# Patient Record
Sex: Female | Born: 1935 | Race: White | Hispanic: No | Smoking: Former smoker
Health system: Southern US, Community
[De-identification: ages and names within clinical notes are randomized; demographics above are authoritative.]

## PROBLEM LIST (undated history)

## (undated) DIAGNOSIS — I5030 Unspecified diastolic (congestive) heart failure: Secondary | ICD-10-CM

## (undated) DIAGNOSIS — C189 Malignant neoplasm of colon, unspecified: Secondary | ICD-10-CM

## (undated) DIAGNOSIS — M316 Other giant cell arteritis: Secondary | ICD-10-CM

## (undated) DIAGNOSIS — G629 Polyneuropathy, unspecified: Secondary | ICD-10-CM

## (undated) DIAGNOSIS — E039 Hypothyroidism, unspecified: Secondary | ICD-10-CM

## (undated) DIAGNOSIS — IMO0002 Reserved for concepts with insufficient information to code with codable children: Secondary | ICD-10-CM

## (undated) DIAGNOSIS — Z9889 Other specified postprocedural states: Secondary | ICD-10-CM

## (undated) DIAGNOSIS — I10 Essential (primary) hypertension: Secondary | ICD-10-CM

## (undated) DIAGNOSIS — D649 Anemia, unspecified: Secondary | ICD-10-CM

## (undated) DIAGNOSIS — E785 Hyperlipidemia, unspecified: Secondary | ICD-10-CM

## (undated) DIAGNOSIS — I43 Cardiomyopathy in diseases classified elsewhere: Secondary | ICD-10-CM

## (undated) DIAGNOSIS — I509 Heart failure, unspecified: Secondary | ICD-10-CM

## (undated) DIAGNOSIS — F0390 Unspecified dementia without behavioral disturbance: Secondary | ICD-10-CM

## (undated) DIAGNOSIS — I119 Hypertensive heart disease without heart failure: Secondary | ICD-10-CM

## (undated) DIAGNOSIS — R0602 Shortness of breath: Secondary | ICD-10-CM

## (undated) DIAGNOSIS — R700 Elevated erythrocyte sedimentation rate: Secondary | ICD-10-CM

## (undated) DIAGNOSIS — Z8719 Personal history of other diseases of the digestive system: Secondary | ICD-10-CM

## (undated) DIAGNOSIS — Z9981 Dependence on supplemental oxygen: Secondary | ICD-10-CM

## (undated) DIAGNOSIS — N390 Urinary tract infection, site not specified: Secondary | ICD-10-CM

## (undated) DIAGNOSIS — G459 Transient cerebral ischemic attack, unspecified: Secondary | ICD-10-CM

## (undated) HISTORY — PX: OTHER SURGICAL HISTORY: SHX169

## (undated) HISTORY — PX: EYE SURGERY: SHX253

---
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
## 1972-09-26 HISTORY — PX: ABDOMINAL HYSTERECTOMY: SHX81

## 1985-09-26 HISTORY — PX: COLECTOMY: SHX59

## 1988-09-26 HISTORY — PX: HERNIA REPAIR: SHX51

## 1998-07-09 ENCOUNTER — Ambulatory Visit (HOSPITAL_COMMUNITY): Admission: RE | Admit: 1998-07-09 | Discharge: 1998-07-09 | Payer: Self-pay | Admitting: Ophthalmology

## 1998-07-15 ENCOUNTER — Ambulatory Visit (HOSPITAL_COMMUNITY): Admission: RE | Admit: 1998-07-15 | Discharge: 1998-07-15 | Payer: Self-pay | Admitting: Ophthalmology

## 1998-07-20 ENCOUNTER — Emergency Department (HOSPITAL_COMMUNITY): Admission: EM | Admit: 1998-07-20 | Discharge: 1998-07-20 | Payer: Self-pay | Admitting: Emergency Medicine

## 1998-07-21 ENCOUNTER — Encounter: Payer: Self-pay | Admitting: Family Medicine

## 1998-07-21 ENCOUNTER — Ambulatory Visit (HOSPITAL_COMMUNITY): Admission: RE | Admit: 1998-07-21 | Discharge: 1998-07-21 | Payer: Self-pay | Admitting: Family Medicine

## 1998-09-26 HISTORY — PX: CHOLECYSTECTOMY: SHX55

## 1998-11-11 ENCOUNTER — Inpatient Hospital Stay (HOSPITAL_COMMUNITY): Admission: AD | Admit: 1998-11-11 | Discharge: 1998-11-16 | Payer: Self-pay | Admitting: *Deleted

## 1998-12-02 ENCOUNTER — Encounter: Payer: Self-pay | Admitting: Gastroenterology

## 1998-12-02 ENCOUNTER — Inpatient Hospital Stay (HOSPITAL_COMMUNITY): Admission: AD | Admit: 1998-12-02 | Discharge: 1998-12-06 | Payer: Self-pay | Admitting: Gastroenterology

## 1998-12-03 ENCOUNTER — Encounter: Payer: Self-pay | Admitting: Gastroenterology

## 1998-12-09 ENCOUNTER — Inpatient Hospital Stay (HOSPITAL_COMMUNITY): Admission: EM | Admit: 1998-12-09 | Discharge: 1998-12-14 | Payer: Self-pay | Admitting: *Deleted

## 1998-12-18 ENCOUNTER — Ambulatory Visit (HOSPITAL_BASED_OUTPATIENT_CLINIC_OR_DEPARTMENT_OTHER): Admission: RE | Admit: 1998-12-18 | Discharge: 1998-12-18 | Payer: Self-pay | Admitting: General Surgery

## 2000-02-15 ENCOUNTER — Encounter: Payer: Self-pay | Admitting: Internal Medicine

## 2000-02-15 ENCOUNTER — Encounter: Admission: RE | Admit: 2000-02-15 | Discharge: 2000-02-15 | Payer: Self-pay | Admitting: Internal Medicine

## 2000-07-26 ENCOUNTER — Encounter (INDEPENDENT_AMBULATORY_CARE_PROVIDER_SITE_OTHER): Payer: Self-pay | Admitting: Specialist

## 2000-07-26 ENCOUNTER — Ambulatory Visit (HOSPITAL_COMMUNITY): Admission: RE | Admit: 2000-07-26 | Discharge: 2000-07-26 | Payer: Self-pay | Admitting: Gastroenterology

## 2001-02-15 ENCOUNTER — Encounter: Payer: Self-pay | Admitting: Internal Medicine

## 2001-02-15 ENCOUNTER — Encounter: Admission: RE | Admit: 2001-02-15 | Discharge: 2001-02-15 | Payer: Self-pay | Admitting: Internal Medicine

## 2001-02-20 ENCOUNTER — Encounter: Admission: RE | Admit: 2001-02-20 | Discharge: 2001-02-20 | Payer: Self-pay | Admitting: Internal Medicine

## 2001-02-20 ENCOUNTER — Encounter: Payer: Self-pay | Admitting: Internal Medicine

## 2001-04-25 ENCOUNTER — Encounter: Admission: RE | Admit: 2001-04-25 | Discharge: 2001-04-25 | Payer: Self-pay | Admitting: Internal Medicine

## 2001-04-25 ENCOUNTER — Encounter: Payer: Self-pay | Admitting: Internal Medicine

## 2001-05-21 ENCOUNTER — Encounter: Payer: Self-pay | Admitting: Neurology

## 2001-05-21 ENCOUNTER — Ambulatory Visit (HOSPITAL_COMMUNITY): Admission: RE | Admit: 2001-05-21 | Discharge: 2001-05-21 | Payer: Self-pay | Admitting: Neurology

## 2001-08-28 ENCOUNTER — Ambulatory Visit (HOSPITAL_COMMUNITY): Admission: RE | Admit: 2001-08-28 | Discharge: 2001-08-28 | Payer: Self-pay | Admitting: Neurology

## 2001-08-28 ENCOUNTER — Encounter: Payer: Self-pay | Admitting: Neurology

## 2001-10-15 ENCOUNTER — Encounter: Payer: Self-pay | Admitting: Rheumatology

## 2001-10-15 ENCOUNTER — Encounter: Admission: RE | Admit: 2001-10-15 | Discharge: 2001-10-15 | Payer: Self-pay | Admitting: Rheumatology

## 2001-11-19 ENCOUNTER — Encounter: Admission: RE | Admit: 2001-11-19 | Discharge: 2002-01-02 | Payer: Self-pay | Admitting: Neurology

## 2002-02-21 ENCOUNTER — Encounter: Payer: Self-pay | Admitting: Internal Medicine

## 2002-02-21 ENCOUNTER — Encounter: Admission: RE | Admit: 2002-02-21 | Discharge: 2002-02-21 | Payer: Self-pay | Admitting: Internal Medicine

## 2002-07-12 ENCOUNTER — Encounter: Payer: Self-pay | Admitting: Orthopedic Surgery

## 2002-07-12 ENCOUNTER — Ambulatory Visit (HOSPITAL_COMMUNITY): Admission: RE | Admit: 2002-07-12 | Discharge: 2002-07-12 | Payer: Self-pay | Admitting: Orthopedic Surgery

## 2002-09-26 HISTORY — PX: ROTATOR CUFF REPAIR: SHX139

## 2002-09-30 ENCOUNTER — Encounter: Payer: Self-pay | Admitting: Orthopedic Surgery

## 2002-09-30 ENCOUNTER — Encounter: Admission: RE | Admit: 2002-09-30 | Discharge: 2002-09-30 | Payer: Self-pay | Admitting: Orthopedic Surgery

## 2002-10-01 ENCOUNTER — Ambulatory Visit (HOSPITAL_BASED_OUTPATIENT_CLINIC_OR_DEPARTMENT_OTHER): Admission: RE | Admit: 2002-10-01 | Discharge: 2002-10-02 | Payer: Self-pay | Admitting: Orthopedic Surgery

## 2002-10-01 ENCOUNTER — Encounter (INDEPENDENT_AMBULATORY_CARE_PROVIDER_SITE_OTHER): Payer: Self-pay | Admitting: *Deleted

## 2003-03-25 ENCOUNTER — Encounter: Admission: RE | Admit: 2003-03-25 | Discharge: 2003-03-25 | Payer: Self-pay | Admitting: Internal Medicine

## 2003-03-25 ENCOUNTER — Encounter: Payer: Self-pay | Admitting: Internal Medicine

## 2003-07-02 ENCOUNTER — Ambulatory Visit (HOSPITAL_COMMUNITY): Admission: RE | Admit: 2003-07-02 | Discharge: 2003-07-02 | Payer: Self-pay | Admitting: Gastroenterology

## 2003-07-15 ENCOUNTER — Encounter: Admission: RE | Admit: 2003-07-15 | Discharge: 2003-07-15 | Payer: Self-pay | Admitting: Internal Medicine

## 2003-07-15 ENCOUNTER — Encounter: Payer: Self-pay | Admitting: Internal Medicine

## 2004-02-24 ENCOUNTER — Encounter: Admission: RE | Admit: 2004-02-24 | Discharge: 2004-02-24 | Payer: Self-pay | Admitting: Internal Medicine

## 2004-03-02 ENCOUNTER — Encounter: Admission: RE | Admit: 2004-03-02 | Discharge: 2004-03-02 | Payer: Self-pay | Admitting: Internal Medicine

## 2004-05-27 ENCOUNTER — Encounter: Admission: RE | Admit: 2004-05-27 | Discharge: 2004-05-27 | Payer: Self-pay | Admitting: Internal Medicine

## 2004-08-16 ENCOUNTER — Encounter: Admission: RE | Admit: 2004-08-16 | Discharge: 2004-08-16 | Payer: Self-pay | Admitting: Internal Medicine

## 2004-10-07 ENCOUNTER — Ambulatory Visit (HOSPITAL_COMMUNITY): Admission: RE | Admit: 2004-10-07 | Discharge: 2004-10-08 | Payer: Self-pay | Admitting: Neurology

## 2004-10-07 HISTORY — PX: LUMBAR PUNCTURE: SHX1985

## 2005-01-02 ENCOUNTER — Encounter: Admission: RE | Admit: 2005-01-02 | Discharge: 2005-01-02 | Payer: Self-pay | Admitting: Internal Medicine

## 2005-04-14 ENCOUNTER — Encounter: Admission: RE | Admit: 2005-04-14 | Discharge: 2005-05-03 | Payer: Self-pay | Admitting: Internal Medicine

## 2005-05-04 ENCOUNTER — Encounter: Admission: RE | Admit: 2005-05-04 | Discharge: 2005-05-31 | Payer: Self-pay | Admitting: Internal Medicine

## 2005-06-09 ENCOUNTER — Encounter: Admission: RE | Admit: 2005-06-09 | Discharge: 2005-06-09 | Payer: Self-pay | Admitting: Internal Medicine

## 2006-01-06 ENCOUNTER — Encounter: Admission: RE | Admit: 2006-01-06 | Discharge: 2006-01-06 | Payer: Self-pay | Admitting: Neurology

## 2006-01-25 ENCOUNTER — Ambulatory Visit (HOSPITAL_COMMUNITY): Admission: RE | Admit: 2006-01-25 | Discharge: 2006-01-25 | Payer: Self-pay | Admitting: Neurology

## 2006-04-11 ENCOUNTER — Encounter: Admission: RE | Admit: 2006-04-11 | Discharge: 2006-04-11 | Payer: Self-pay | Admitting: Internal Medicine

## 2006-06-20 ENCOUNTER — Encounter: Admission: RE | Admit: 2006-06-20 | Discharge: 2006-06-20 | Payer: Self-pay | Admitting: Internal Medicine

## 2007-06-27 ENCOUNTER — Encounter: Admission: RE | Admit: 2007-06-27 | Discharge: 2007-06-27 | Payer: Self-pay | Admitting: *Deleted

## 2008-06-30 ENCOUNTER — Encounter: Admission: RE | Admit: 2008-06-30 | Discharge: 2008-06-30 | Payer: Self-pay | Admitting: *Deleted

## 2008-10-23 ENCOUNTER — Encounter: Admission: RE | Admit: 2008-10-23 | Discharge: 2008-10-23 | Payer: Self-pay | Admitting: Family Medicine

## 2009-08-25 ENCOUNTER — Encounter: Admission: RE | Admit: 2009-08-25 | Discharge: 2009-08-25 | Payer: Self-pay | Admitting: Family Medicine

## 2009-09-26 HISTORY — PX: OTHER SURGICAL HISTORY: SHX169

## 2009-11-06 ENCOUNTER — Encounter: Admission: RE | Admit: 2009-11-06 | Discharge: 2009-11-06 | Payer: Self-pay | Admitting: Neurology

## 2010-06-08 ENCOUNTER — Ambulatory Visit (HOSPITAL_COMMUNITY): Admission: RE | Admit: 2010-06-08 | Discharge: 2010-06-08 | Payer: Self-pay | Admitting: Neurology

## 2010-06-08 ENCOUNTER — Encounter (INDEPENDENT_AMBULATORY_CARE_PROVIDER_SITE_OTHER): Payer: Self-pay | Admitting: Neurology

## 2010-06-08 ENCOUNTER — Ambulatory Visit: Payer: Self-pay | Admitting: Vascular Surgery

## 2010-06-10 ENCOUNTER — Emergency Department (HOSPITAL_COMMUNITY): Admission: EM | Admit: 2010-06-10 | Discharge: 2010-06-11 | Payer: Self-pay | Admitting: Emergency Medicine

## 2010-06-10 ENCOUNTER — Ambulatory Visit: Payer: Self-pay | Admitting: Vascular Surgery

## 2010-06-18 ENCOUNTER — Ambulatory Visit: Payer: Self-pay | Admitting: Cardiology

## 2010-06-18 ENCOUNTER — Inpatient Hospital Stay (HOSPITAL_COMMUNITY): Admission: RE | Admit: 2010-06-18 | Discharge: 2010-06-24 | Payer: Self-pay | Admitting: Vascular Surgery

## 2010-06-18 ENCOUNTER — Encounter (INDEPENDENT_AMBULATORY_CARE_PROVIDER_SITE_OTHER): Payer: Self-pay | Admitting: Family Medicine

## 2010-06-21 ENCOUNTER — Ambulatory Visit: Payer: Self-pay | Admitting: Physical Medicine & Rehabilitation

## 2010-06-24 ENCOUNTER — Inpatient Hospital Stay (HOSPITAL_COMMUNITY)
Admission: RE | Admit: 2010-06-24 | Discharge: 2010-07-06 | Payer: Self-pay | Admitting: Physical Medicine & Rehabilitation

## 2010-06-24 ENCOUNTER — Ambulatory Visit: Payer: Self-pay | Admitting: Physical Medicine & Rehabilitation

## 2010-07-15 ENCOUNTER — Ambulatory Visit: Payer: Self-pay | Admitting: Vascular Surgery

## 2010-07-30 ENCOUNTER — Ambulatory Visit: Payer: Self-pay | Admitting: Vascular Surgery

## 2010-07-30 ENCOUNTER — Ambulatory Visit (HOSPITAL_COMMUNITY): Admission: RE | Admit: 2010-07-30 | Discharge: 2010-07-30 | Payer: Self-pay | Admitting: Vascular Surgery

## 2010-10-11 ENCOUNTER — Encounter (HOSPITAL_BASED_OUTPATIENT_CLINIC_OR_DEPARTMENT_OTHER)
Admission: RE | Admit: 2010-10-11 | Discharge: 2010-10-26 | Payer: Self-pay | Source: Home / Self Care | Attending: Internal Medicine | Admitting: Internal Medicine

## 2010-10-23 ENCOUNTER — Observation Stay (HOSPITAL_COMMUNITY)
Admission: EM | Admit: 2010-10-23 | Discharge: 2010-10-28 | Disposition: A | Discharge status: Skilled Nursing Facility | Payer: Medicare Other | Attending: Internal Medicine | Admitting: Internal Medicine

## 2010-10-23 DIAGNOSIS — F0280 Dementia in other diseases classified elsewhere without behavioral disturbance: Secondary | ICD-10-CM | POA: Insufficient documentation

## 2010-10-23 DIAGNOSIS — E785 Hyperlipidemia, unspecified: Secondary | ICD-10-CM | POA: Insufficient documentation

## 2010-10-23 DIAGNOSIS — I498 Other specified cardiac arrhythmias: Secondary | ICD-10-CM | POA: Insufficient documentation

## 2010-10-23 DIAGNOSIS — D509 Iron deficiency anemia, unspecified: Secondary | ICD-10-CM | POA: Insufficient documentation

## 2010-10-23 DIAGNOSIS — G20A1 Parkinson's disease without dyskinesia, without mention of fluctuations: Secondary | ICD-10-CM | POA: Insufficient documentation

## 2010-10-23 DIAGNOSIS — E039 Hypothyroidism, unspecified: Secondary | ICD-10-CM | POA: Insufficient documentation

## 2010-10-23 DIAGNOSIS — Z8673 Personal history of transient ischemic attack (TIA), and cerebral infarction without residual deficits: Secondary | ICD-10-CM | POA: Insufficient documentation

## 2010-10-23 DIAGNOSIS — G20 Parkinson's disease: Secondary | ICD-10-CM | POA: Insufficient documentation

## 2010-10-23 DIAGNOSIS — R32 Unspecified urinary incontinence: Secondary | ICD-10-CM | POA: Insufficient documentation

## 2010-10-23 DIAGNOSIS — M069 Rheumatoid arthritis, unspecified: Secondary | ICD-10-CM | POA: Insufficient documentation

## 2010-10-23 DIAGNOSIS — L97509 Non-pressure chronic ulcer of other part of unspecified foot with unspecified severity: Secondary | ICD-10-CM | POA: Insufficient documentation

## 2010-10-23 DIAGNOSIS — R5381 Other malaise: Secondary | ICD-10-CM | POA: Insufficient documentation

## 2010-10-23 DIAGNOSIS — IMO0002 Reserved for concepts with insufficient information to code with codable children: Secondary | ICD-10-CM | POA: Insufficient documentation

## 2010-10-23 DIAGNOSIS — I10 Essential (primary) hypertension: Secondary | ICD-10-CM | POA: Insufficient documentation

## 2010-10-23 DIAGNOSIS — R079 Chest pain, unspecified: Secondary | ICD-10-CM | POA: Insufficient documentation

## 2010-10-23 DIAGNOSIS — R55 Syncope and collapse: Principal | ICD-10-CM | POA: Insufficient documentation

## 2010-10-23 LAB — DIFFERENTIAL
Basophils Absolute: 0 10*3/uL (ref 0.0–0.1)
Basophils Relative: 0 % (ref 0–1)
Eosinophils Absolute: 0.1 10*3/uL (ref 0.0–0.7)
Eosinophils Relative: 2 % (ref 0–5)
Lymphs Abs: 1.8 10*3/uL (ref 0.7–4.0)
Monocytes Absolute: 0.6 10*3/uL (ref 0.1–1.0)
Monocytes Relative: 7 % (ref 3–12)

## 2010-10-23 LAB — CBC
HCT: 33.5 % — ABNORMAL LOW (ref 36.0–46.0)
MCHC: 31.3 g/dL (ref 30.0–36.0)
MCV: 88.6 fL (ref 78.0–100.0)

## 2010-10-23 LAB — URINALYSIS, ROUTINE W REFLEX MICROSCOPIC
Nitrite: NEGATIVE
pH: 6.5 (ref 5.0–8.0)

## 2010-10-23 LAB — COMPREHENSIVE METABOLIC PANEL
ALT: <8 U/L (ref 0–35)
AST: 28 U/L (ref 0–37)
Calcium: 10.8 mg/dL — ABNORMAL HIGH (ref 8.4–10.5)
GFR calc Af Amer: 54 mL/min — ABNORMAL LOW (ref >60)
Glucose, Bld: 102 mg/dL — ABNORMAL HIGH (ref 70–99)

## 2010-10-23 LAB — CK TOTAL AND CKMB (NOT AT ARMC)
CK, MB: 2.2 ng/mL (ref 0.3–4.0)
Relative Index: INVALID (ref 0.0–2.5)
Total CK: 28 U/L (ref 7–177)

## 2010-10-23 LAB — TSH: TSH: 1.21 u[IU]/mL (ref 0.350–4.500)

## 2010-10-23 LAB — POCT CARDIAC MARKERS
CKMB, poc: 1.2 ng/mL (ref 1.0–8.0)
Myoglobin, poc: 122 ng/mL (ref 12–200)

## 2010-10-23 LAB — SEDIMENTATION RATE: Sed Rate: 91 mm/hr — ABNORMAL HIGH (ref 0–22)

## 2010-10-23 NOTE — H&P (Signed)
NAMESHARONLEE, NINE NO.:  [id number]  MEDICAL RECORD NO.:  [id number]          PATIENT TYPE:  EMS  LOCATION:  MAJO                         FACILITY:  [facility]  PHYSICIAN:  Homero Fellers, MD   DATE OF BIRTH:  October 04, 1929  DATE OF ADMISSION:  10/23/2010 DATE OF DISCHARGE:                             HISTORY & PHYSICAL   PRIMARY CARE PHYSICIAN:  [facility].  CHIEF COMPLAINT:  Syncope.  HISTORY OF PRESENT ILLNESS:  A 77 year old woman who lives with her daughter.  She was brought to the emergency room after she had witnessed syncopal episodes.  According to her daughter who is the main informant, the patient was on the commode and then she suddenly lost consciousness with eyes partly rolling backward in the head and it took about 2 minutes before she gained consciousness.  The patient did not have any witnessed seizure activity.  There is also no fecal incontinence.  The patient has history of baseline urinary incontinence and mostly wears a diaper.  The patient was recently treated empirically for urinary tract infection by a primary care physician with nitrofurantoin.  She was empirically treated  because of elevated WBC in blood.  Because of her urinary incontinence, urinalysis was not done due to reluctance in catheterization to obtain urine sample.  In the emergency room, repeat urinalysis however did not show any evidence of urinary tract infection.  She completed nitrofurantoin course yesterday.  The patient denies any fever, chest pain, shortness of breath, nausea, vomiting, or diaphoresis.  She has had no prior history of syncope or any previous work up for same.  She is currently on Aggrenox for TIA and also on Sinemet for possible Parkinson's disease.  At this time, the patient is currently back to her baseline.  Also of note, the family has noticed that in the past 1 week, the patient has been weak and unable to walk as she was able to  previously. She is baseline, is able to walk with a walker.  PAST MEDICAL HISTORY:  High blood pressure, hypothyroidism, Parkinson's disease, peripheral neuropathy, transient ischemic attack.  History of previous or recurrent urinary tract infection.  Questionable peripheral vascular disease with chronic ulcer in the left foot.  PAST SURGICAL HISTORY:  Cholecystectomy, hernia repair, hysterectomy, and tonsillectomy.  MEDICATIONS: 1. Carbidopa/levodopa 25/100, 1 tablet b.i.d. 2. Nitrofurantoin has been completed. 3. Levothyroxine 150 mcg daily. 4. Tylenol p.r.n. 5. MiraLax p.r.n. 6. Calcium 600 mg daily. 7. Aggrenox 25 mg/200, 1 tab b.i.d. 8. Cymbalta 30 mg daily. 9. Hydrochlorothiazide 25 mg daily. 10.Prednisone 10 mg daily. 11.Metoprolol 50 mg b.i.d. (this was just started about 3 months ago). 12.Vitamin D3 1 tablet daily. 13.Aricept 10 mg daily. 14.Zocor 20 mg daily. 15.Omeprazole 20 mg daily. 16.Centrum silver.  ALLERGIES:  CODEINE, MORPHINE, DEMEROL, and CIPROFLOXACIN.  SOCIAL HISTORY:  No smoking, alcohol, or drugs.  The patient lives with daughter.  REVIEW OF SYSTEMS:  A 10-point review of systems is negative except as described above.  FAMILY HISTORY:  Noncontributory.  PHYSICAL EXAMINATION:  VITAL SIGNS:  Blood pressure is 136/55, pulse 48- 52, respirations 18, temp 97.7, O2 sat 100%.  GENERAL:  She is lying in bed comfortable in no distress, awake, alert and oriented. HEENT:  Pallor.  Mouth is slightly dry. NECK:  Supple.  No JVD, adenopathy, or thyromegaly. LUNGS:  Clear to auscultation bilaterally.  No wheezing, no crackles. HEART:  S1, S2.  There is bradycardia.  No murmurs, rubs, or gallops. ABDOMEN:  Full, soft, nontender, bowel sounds present.  No masses. EXTREMITIES:  No edema, clubbing, or cyanosis.  She has dressing over the left foot.  There is also evidence of poor circulation to the left foot. The color appeared to be normal. NEUROLOGIC:.  The  patient is alert and oriented x3.  Cranial nerves II- XII is intact.  Speech is normal.  Coordination is preserved in the upper extremity.  Gait was not tested.  LABORATORY DATA:  Sodium is 132, potassium 3.7, BUN 26, creatinine 1.18, glucose is 102.  Liver enzymes are grossly normal.  White count is also normal at 8.4, hemoglobin 10.5, platelet count is 324,000.  Urinalysis today was unremarkable.  Cardiac enzymes x1 were negative.  EKG showed some bradycardia at about 48 beats per minute with right bundle-branch block. I reviewed her old EKG in September of last year which showed normal sinus rhythm of 84 beats per minute with right bundle-branch block.  IMAGING:  Chest x-ray showed cardiomegaly with no active lung disease.  ASSESSMENT: 1. This is a 77 year old woman admitted with an episode of syncope,     gastroesophageal reflux disease.  Etiology of this is okay, but     bradycardia into the 30s or 40s and possibilities of syncopal     episodes.  She will need full workup for syncope as this is a new     presentation for her. 2. Sinus bradycardia which might be secondary to metoprolol which was     started about 3 months ago, especially in view of the fact that EKG     3 months ago was normal sinus rhythm. 3. Mild clinical dehydration. 4. High blood pressure which is currently stable. 5. History of transient ischemic attack, on Aggrenox.  The patient is     also complaining of weakness in her legs and inability to bear     weight for the past 1 week.  Workup for CVA would be reasonable. 6. Recent urinary tract infection which has been fully treated. 7. Chronic left leg ulcer with possible peripheral vascular disease. PLAN: Admit to telemetry, do syncopal workup including     a 2-D echo, carotid Doppler, and MRI/MRA of the brain.  The patient     had an echo about 4 months ago; We may need to obtain the result.  The patient will be on telemetry floor to get serial cardiac enzyme  and EKG.  I will hold beta blocker and     hydrochlorothiazide,and  give her some IV fluids.  We can use hydralazine if needed     for blood pressure control.  All other home medicines to be continued.  She     will be on DVT prophylaxis.  Her condition overall is stable, 55     minutes spent.   ADDENDUM:  While in the ED, patient suddenly became confused and not answering questions. There was no new focal weakness, drooling or loss of nasolabial fold on exam. A stst MRI Brain did not show any evidence of acute stroke. Patient will be closely watched on  telemetry. She has Deemetia and this may be one manifestation of her  disease.   Homero Fellers, MD     FA/MEDQ  D:  10/23/2010  T:  10/23/2010  Job:  [id number]  Electronically Signed by Homero Fellers  on 10/23/2010 05:05:21 PM

## 2010-10-24 LAB — RETICULOCYTES
RBC.: 3.77 MIL/uL — ABNORMAL LOW (ref 3.87–5.11)
Retic Count, Absolute: 33.9 10*3/uL (ref 19.0–186.0)
Retic Ct Pct: 0.9 % (ref 0.4–3.1)

## 2010-10-24 LAB — BASIC METABOLIC PANEL
CO2: 25 mEq/L (ref 19–32)
Chloride: 103 mEq/L (ref 96–112)
GFR calc Af Amer: 56 mL/min — ABNORMAL LOW (ref >60)
Sodium: 138 mEq/L (ref 135–145)

## 2010-10-24 LAB — CARDIAC PANEL(CRET KIN+CKTOT+MB+TROPI)
CK, MB: 4.8 ng/mL — ABNORMAL HIGH (ref 0.3–4.0)
Total CK: 186 U/L — ABNORMAL HIGH (ref 7–177)
Troponin I: 0.05 ng/mL (ref 0.00–0.06)

## 2010-10-24 LAB — CBC
Hemoglobin: 9.9 g/dL — ABNORMAL LOW (ref 12.0–15.0)
Platelets: 286 10*3/uL (ref 150–400)
RBC: 3.61 MIL/uL — ABNORMAL LOW (ref 3.87–5.11)
WBC: 7.7 10*3/uL (ref 4.0–10.5)

## 2010-10-24 LAB — MAGNESIUM: Magnesium: 1.3 mg/dL — ABNORMAL LOW (ref 1.5–2.5)

## 2010-10-24 LAB — FERRITIN: Ferritin: 36 ng/mL (ref 10–291)

## 2010-10-24 LAB — IRON AND TIBC
Saturation Ratios: 8 % — ABNORMAL LOW (ref 20–55)
TIBC: 331 ug/dL (ref 250–470)
UIBC: 305 ug/dL

## 2010-10-24 LAB — URINE CULTURE: Culture: NO GROWTH

## 2010-10-24 LAB — LIPID PANEL
Cholesterol: 175 mg/dL (ref 0–200)
Total CHOL/HDL Ratio: 2.1 RATIO

## 2010-10-26 LAB — MAGNESIUM: Magnesium: 1.7 mg/dL (ref 1.5–2.5)

## 2010-10-26 LAB — CBC
HCT: 28.7 % — ABNORMAL LOW (ref 36.0–46.0)
Hemoglobin: 9.1 g/dL — ABNORMAL LOW (ref 12.0–15.0)
MCH: 28.1 pg (ref 26.0–34.0)
MCHC: 31.7 g/dL (ref 30.0–36.0)
MCV: 88.6 fL (ref 78.0–100.0)
RDW: 14.8 % (ref 11.5–15.5)

## 2010-10-26 LAB — BASIC METABOLIC PANEL
BUN: 18 mg/dL (ref 6–23)
Chloride: 103 mEq/L (ref 96–112)
Creatinine, Ser: 1.16 mg/dL (ref 0.4–1.2)
Glucose, Bld: 91 mg/dL (ref 70–99)
Potassium: 3.8 mEq/L (ref 3.5–5.1)

## 2010-10-28 LAB — CBC
HCT: 32.2 % — ABNORMAL LOW (ref 36.0–46.0)
Hemoglobin: 9.7 g/dL — ABNORMAL LOW (ref 12.0–15.0)
MCHC: 30.1 g/dL (ref 30.0–36.0)
MCV: 89.2 fL (ref 78.0–100.0)

## 2010-10-28 LAB — DIFFERENTIAL
Basophils Absolute: 0 10*3/uL (ref 0.0–0.1)
Lymphocytes Relative: 31 % (ref 12–46)
Lymphs Abs: 2.1 10*3/uL (ref 0.7–4.0)
Monocytes Absolute: 0.5 10*3/uL (ref 0.1–1.0)
Neutro Abs: 4 10*3/uL (ref 1.7–7.7)

## 2010-10-28 LAB — BASIC METABOLIC PANEL
BUN: 15 mg/dL (ref 6–23)
CO2: 23 mEq/L (ref 19–32)
Glucose, Bld: 86 mg/dL (ref 70–99)
Potassium: 3.7 mEq/L (ref 3.5–5.1)
Sodium: 134 mEq/L — ABNORMAL LOW (ref 135–145)

## 2010-10-28 NOTE — Discharge Summary (Signed)
NAMEYARELIE, HAMS NO.:  [id number]  MEDICAL RECORD NO.:  [id number]          PATIENT TYPE:  OBV  LOCATION:  4733                         FACILITY:  [facility]  PHYSICIAN:  Hartley Barefoot, MD    DATE OF BIRTH:  [birth year]  DATE OF ADMISSION:  10/23/2010 DATE OF DISCHARGE:  10/28/10                        DISCHARGE SUMMARY - REFERRING   ANTICIPATED DISCHARGE DATE:  October 28, 2010.  DISCHARGE DIAGNOSES: 1. Syncope, possible secondary to bradycardia. 2. Deconditioning. 3. Iron-deficiency anemia. 4. Left foot chronic wound followed by wound care, Dr. Cheryll Cockayne. 5. Sinus bradycardia secondary to beta-blocker.  OTHER PAST MEDICAL HISTORY.: 1. Hypertension. 2. History of CVA. 3. History of urinary tract infection. 4. Parkinson disease. 5. History of giant cell arteritis, on chronic prednisone. 6. Hypothyroidism. 7. Hyperlipidemia.  DISCHARGE MEDICATIONS: 1. Ferrous sulfate 325 p.o. t.i.d. 2. Mupirocin, Bactroban one application topically daily. 3. Aggrenox one capsule by mouth twice daily. 4. Aricept 10 mg p.o. daily at bedtime. 5. Tylenol 325 one to two tablets by mouth every 4 hours as needed. 6. Calcium carbonate 600 two tablets by mouth twice daily. 7. Cymbalta 30 mg one capsule by mouth every morning. 8. Carbidopa and levodopa 25/100 mg 1 tablet by mouth twice daily. 9. Hydrochlorothiazide 25 mg p.o. every morning. 10.Levothyroxine 150 mcg p.o. every morning. 11.MiraLax 1 pack by mouth daily as needed. 12.Multivitamins one tablet by mouth every morning. 13.Omeprazole 20 mg 1 tablet at bedtime. 14.Prednisone 10 mg p.o. every morning. 15.Simvastatin 20 mg p.o. at bedtime.  Medications during hospitalization: 1. Metoprolol 50 mg p.o. b.i.d. 2. Nitrofurantoin 2 tablets twice daily.  DISPOSITION AND FOLLOWUP:  The patient was transferred to [facility] for short period.  She will need physical therapy and she will need wound care consult.   She will need to follow up with her primary care physician and Dr. Cheryll Cockayne, wound care physician, within 1 week.  She will need a CBC to follow hemoglobin levels.  The MRI of brain on October 23, 2010, showed no acute intracranial abnormality, atrophy and extensive white matter disease.  This is most likely reflected  chronic microvascular ischemia.  Small right mastoid effusion without obstructive nasopharyngeal lesion. Chest x-ray showed mild cardiomegaly,no active  disease,  mild peribronchial thickening.  Carotid Doppler bilateral, no ICA stenosis.  2-D echo showed left ventricular pressures with 65 to 70%.  No wall motion abnormality.  BRIEF HISTORY OF PRESENT ILLNESS:  This is a very pleasant 77 year old with multiple medical problems who presented here after syncope event. The patient was found by daughter lying on the floor.  According to the daughter, the patient was sitting in the commode when she loss conciousness and it took about 2 minutes before she gained consciousness.  No witnessed seizure activity.  No fecal incontinence.  HOSPITAL COURSE: 1. Syncope.  The patient was admitted to Telemetry.  She was found to     be bradycardic, heart rate of 40s.  Syncope was possibly     secondary to bradycardia.  She was taking beta-blockers.  Her heart     rate has increased and it has remained 88-90s.  I will  continue to     hold beta-blockers.  MRI was negative for acute stroke. No Arrhythmia on telemetry.     Cardiac enzymes, troponin x3 negative.  The patient is now back to     baseline. 2. Deconditioning.  The patient's physical therapy recommended at     Skilled Nursing Facility.  The patient will be transferred to     Skilled Nursing Facility when bed is available.  She will need to     go physical therapy on daily basis. 3. Anemia.  The patient with iron-deficiency anemia.  Panel drawn on     October 24, 2010, show iron at 26, percent saturation 8, B12 of     979, folate  more than 20, ferritin 36.  The patient was started on     ferrous sulfate 325 p.o. t.i.d.  If the patient has not had a     colonoscopy, she might need one.  They will need to follow through     with her primary care physician. 4. History of temporal arteritis.  The patient was recently started on     prednisone 10 mg p.o. daily due to headaches and increased ESR.     She will need to follow up this with her primary care physician.     Continue with prednisone 10 mg p.o. daily. 5. Left lower extremity chronic wound.  She will follow with Dr.     Cheryll Cockayne.  The plan is for conservative treatment.  On wound care     basis, now he recommends application of Iodosorb gel.  Cover with     dry dressing and she will have Lambs wool placed between the toes of     the left foot.  she will use either a heel protector boot to protect      her extremity for pressure on the heel.   Those are recommendation per Dr. Cheryll Cockayne.  A copy of this     wound care clinic nurse will be faxed to Skilled Nursing Facility     also.  We recommend after shower that she air dry, pat and dry, then reapply her     ointment and dressing as above. 6. Hypertension.  We will restart hydrochlorothiazide.  Discontinue     beta-blocker. 7. On the day of discharge, the patient was in good condition.     Vitals, temperature 97.3, pulse 88, respirations 18,     blood pressure 140/77, sats 98 on room air.  Occult blood negative.     CBC on October 26, 2010, show hemoglobin 9.1, white blood cell 7.9,     platelet 286.  Sodium 138, potassium 3.9, chloride 103, bicarbonate     27, glucose 91, BUN 18, and creatinine 1.16.     Hartley Barefoot, MD     BR/MEDQ  D:  10/27/2010  T:  10/27/2010  Job:  [id number]  cc:   Nancee Liter, MD skilled nursing facility, no name given  Electronically Signed by Hartley Barefoot MD on 10/28/2010 10:28:48 AM

## 2010-10-29 ENCOUNTER — Encounter (HOSPITAL_BASED_OUTPATIENT_CLINIC_OR_DEPARTMENT_OTHER): Payer: Medicare Other | Attending: Internal Medicine

## 2010-10-29 DIAGNOSIS — I10 Essential (primary) hypertension: Secondary | ICD-10-CM | POA: Insufficient documentation

## 2010-10-29 DIAGNOSIS — E079 Disorder of thyroid, unspecified: Secondary | ICD-10-CM | POA: Insufficient documentation

## 2010-10-29 DIAGNOSIS — L98499 Non-pressure chronic ulcer of skin of other sites with unspecified severity: Secondary | ICD-10-CM | POA: Insufficient documentation

## 2010-10-29 DIAGNOSIS — E785 Hyperlipidemia, unspecified: Secondary | ICD-10-CM | POA: Insufficient documentation

## 2010-10-29 DIAGNOSIS — I739 Peripheral vascular disease, unspecified: Secondary | ICD-10-CM | POA: Insufficient documentation

## 2010-10-29 DIAGNOSIS — K219 Gastro-esophageal reflux disease without esophagitis: Secondary | ICD-10-CM | POA: Insufficient documentation

## 2010-11-01 NOTE — H&P (Signed)
  NAMEEVELETTE, [name] NO.:  [id number]  MEDICAL RECORD NO.:  [id number]          PATIENT TYPE:  OBV  LOCATION:  4733                         FACILITY:  [facility]  PHYSICIAN:  Homero Fellers, MD   DATE OF BIRTH:  1930-01-10  DATE OF ADMISSION:  10/23/2010 DATE OF DISCHARGE:                             HISTORY & PHYSICAL   ADDENDUM  Please note that the patient was diagnosed with temporal arteritis some time last year and has been on prednisone which has been tapered down to 10 mg daily, which she is currently on.  She was off prednisone at some point, but this was started recently because of headaches and elevated ESR.  The patient also being followed [facility] for leg foot ulcer as described in the body of the history above as well as peripheral vascular disease which is severe and unrepairable going by Dr. Norman Clay notes on October 11, 2010.  The patient continued wound care while in the hospital.  She might need a formal consult.     Homero Fellers, MD     FA/MEDQ  D:  10/23/2010  T:  10/24/2010  Job:  [id number]  Electronically Signed by Homero Fellers  on 11/01/2010 10:27:01 PM

## 2010-11-29 ENCOUNTER — Encounter (HOSPITAL_BASED_OUTPATIENT_CLINIC_OR_DEPARTMENT_OTHER): Payer: Medicare Other | Attending: Internal Medicine

## 2010-11-29 DIAGNOSIS — L97509 Non-pressure chronic ulcer of other part of unspecified foot with unspecified severity: Secondary | ICD-10-CM | POA: Insufficient documentation

## 2010-12-03 ENCOUNTER — Emergency Department (HOSPITAL_COMMUNITY): Payer: Medicare Other

## 2010-12-03 ENCOUNTER — Inpatient Hospital Stay (HOSPITAL_COMMUNITY)
Admission: EM | Admit: 2010-12-03 | Discharge: 2010-12-06 | DRG: 690 | Disposition: A | Discharge status: Home Health | Payer: Medicare Other | Attending: Internal Medicine | Admitting: Internal Medicine

## 2010-12-03 LAB — POCT I-STAT, CHEM 8
Calcium, Ion: 1.08 mmol/L — ABNORMAL LOW (ref 1.12–1.32)
Glucose, Bld: 96 mg/dL (ref 70–99)
HCT: 37 % (ref 36.0–46.0)
Hemoglobin: 12.6 g/dL (ref 12.0–15.0)
TCO2: 26 mmol/L (ref 0–100)

## 2010-12-03 LAB — CBC
HCT: 35.6 % — ABNORMAL LOW (ref 36.0–46.0)
MCH: 28 pg (ref 26.0–34.0)
MCHC: 31.2 g/dL (ref 30.0–36.0)
MCV: 89.7 fL (ref 78.0–100.0)
RDW: 18 % — ABNORMAL HIGH (ref 11.5–15.5)

## 2010-12-03 LAB — URINALYSIS, ROUTINE W REFLEX MICROSCOPIC
Bilirubin Urine: NEGATIVE
Ketones, ur: NEGATIVE mg/dL
Nitrite: NEGATIVE
Urobilinogen, UA: 0.2 mg/dL (ref 0.0–1.0)
pH: 8.5 — ABNORMAL HIGH (ref 5.0–8.0)

## 2010-12-03 LAB — BRAIN NATRIURETIC PEPTIDE: Pro B Natriuretic peptide (BNP): 119 pg/mL — ABNORMAL HIGH (ref 0.0–100.0)

## 2010-12-03 LAB — COMPREHENSIVE METABOLIC PANEL
Alkaline Phosphatase: 55 U/L (ref 39–117)
BUN: 14 mg/dL (ref 6–23)
GFR calc non Af Amer: 38 mL/min — ABNORMAL LOW (ref >60)
Glucose, Bld: 98 mg/dL (ref 70–99)
Potassium: 3.7 mEq/L (ref 3.5–5.1)
Total Bilirubin: 0.6 mg/dL (ref 0.3–1.2)
Total Protein: 6.8 g/dL (ref 6.0–8.3)

## 2010-12-03 LAB — POCT CARDIAC MARKERS
Myoglobin, poc: 292 ng/mL (ref 12–200)
Troponin i, poc: <0.05 ng/mL (ref 0.00–0.09)

## 2010-12-03 LAB — DIFFERENTIAL
Eosinophils Relative: 0 % (ref 0–5)
Lymphocytes Relative: 19 % (ref 12–46)
Lymphs Abs: 1.9 10*3/uL (ref 0.7–4.0)
Monocytes Absolute: 0.6 10*3/uL (ref 0.1–1.0)

## 2010-12-04 DIAGNOSIS — Z8673 Personal history of transient ischemic attack (TIA), and cerebral infarction without residual deficits: Secondary | ICD-10-CM

## 2010-12-04 DIAGNOSIS — R5381 Other malaise: Secondary | ICD-10-CM | POA: Diagnosis present

## 2010-12-04 DIAGNOSIS — N390 Urinary tract infection, site not specified: Principal | ICD-10-CM | POA: Diagnosis present

## 2010-12-04 DIAGNOSIS — I739 Peripheral vascular disease, unspecified: Secondary | ICD-10-CM | POA: Diagnosis present

## 2010-12-04 DIAGNOSIS — F0390 Unspecified dementia without behavioral disturbance: Secondary | ICD-10-CM | POA: Diagnosis present

## 2010-12-04 DIAGNOSIS — E039 Hypothyroidism, unspecified: Secondary | ICD-10-CM | POA: Diagnosis present

## 2010-12-04 DIAGNOSIS — E860 Dehydration: Secondary | ICD-10-CM | POA: Diagnosis present

## 2010-12-04 DIAGNOSIS — E785 Hyperlipidemia, unspecified: Secondary | ICD-10-CM | POA: Diagnosis present

## 2010-12-04 DIAGNOSIS — I10 Essential (primary) hypertension: Secondary | ICD-10-CM | POA: Diagnosis present

## 2010-12-04 DIAGNOSIS — G20A1 Parkinson's disease without dyskinesia, without mention of fluctuations: Secondary | ICD-10-CM | POA: Diagnosis present

## 2010-12-04 DIAGNOSIS — G20 Parkinson's disease: Secondary | ICD-10-CM | POA: Diagnosis present

## 2010-12-04 DIAGNOSIS — L98499 Non-pressure chronic ulcer of skin of other sites with unspecified severity: Secondary | ICD-10-CM | POA: Diagnosis present

## 2010-12-04 DIAGNOSIS — R5383 Other fatigue: Secondary | ICD-10-CM | POA: Diagnosis present

## 2010-12-04 DIAGNOSIS — L97909 Non-pressure chronic ulcer of unspecified part of unspecified lower leg with unspecified severity: Secondary | ICD-10-CM | POA: Diagnosis present

## 2010-12-04 LAB — CBC
MCH: 29 pg (ref 26.0–34.0)
MCHC: 32.1 g/dL (ref 30.0–36.0)
Platelets: 268 10*3/uL (ref 150–400)

## 2010-12-04 LAB — COMPREHENSIVE METABOLIC PANEL
AST: 24 U/L (ref 0–37)
BUN: 12 mg/dL (ref 6–23)
CO2: 31 mEq/L (ref 19–32)
Calcium: 9 mg/dL (ref 8.4–10.5)
Chloride: 102 mEq/L (ref 96–112)
Creatinine, Ser: 1.27 mg/dL — ABNORMAL HIGH (ref 0.4–1.2)
GFR calc non Af Amer: 41 mL/min — ABNORMAL LOW (ref >60)
Glucose, Bld: 96 mg/dL (ref 70–99)
Total Bilirubin: 0.6 mg/dL (ref 0.3–1.2)

## 2010-12-04 LAB — PROLACTIN: Prolactin: 15.4 ng/mL

## 2010-12-04 LAB — CARDIAC PANEL(CRET KIN+CKTOT+MB+TROPI)
Relative Index: INVALID (ref 0.0–2.5)
Relative Index: INVALID (ref 0.0–2.5)
Total CK: 92 U/L (ref 7–177)
Troponin I: 0.08 ng/mL — ABNORMAL HIGH (ref 0.00–0.06)
Troponin I: 0.06 ng/mL (ref 0.00–0.06)

## 2010-12-05 LAB — BASIC METABOLIC PANEL
BUN: 8 mg/dL (ref 6–23)
Calcium: 8.6 mg/dL (ref 8.4–10.5)
Chloride: 104 mEq/L (ref 96–112)
Creatinine, Ser: 0.94 mg/dL (ref 0.4–1.2)

## 2010-12-05 LAB — MAGNESIUM: Magnesium: 1.7 mg/dL (ref 1.5–2.5)

## 2010-12-05 LAB — CBC
MCH: 29.4 pg (ref 26.0–34.0)
MCHC: 32.3 g/dL (ref 30.0–36.0)
MCV: 91.1 fL (ref 78.0–100.0)
Platelets: 261 10*3/uL (ref 150–400)
RBC: 3.81 MIL/uL — ABNORMAL LOW (ref 3.87–5.11)

## 2010-12-05 LAB — T4, FREE: Free T4: 1.45 ng/dL (ref 0.80–1.80)

## 2010-12-05 LAB — TSH: TSH: 0.207 u[IU]/mL — ABNORMAL LOW (ref 0.350–4.500)

## 2010-12-06 LAB — BASIC METABOLIC PANEL
CO2: 29 mEq/L (ref 19–32)
Chloride: 106 mEq/L (ref 96–112)
GFR calc Af Amer: >60 mL/min (ref >60)
Glucose, Bld: 105 mg/dL — ABNORMAL HIGH (ref 70–99)
Sodium: 142 mEq/L (ref 135–145)

## 2010-12-06 LAB — CBC
HCT: 32.5 % — ABNORMAL LOW (ref 36.0–46.0)
Hemoglobin: 10.4 g/dL — ABNORMAL LOW (ref 12.0–15.0)
MCH: 29.5 pg (ref 26.0–34.0)
MCHC: 32 g/dL (ref 30.0–36.0)
RBC: 3.53 MIL/uL — ABNORMAL LOW (ref 3.87–5.11)

## 2010-12-07 LAB — URINE CULTURE

## 2010-12-07 LAB — POCT I-STAT, CHEM 8
Calcium, Ion: 1.16 mmol/L (ref 1.12–1.32)
Chloride: 103 mEq/L (ref 96–112)
Creatinine, Ser: 1.6 mg/dL — ABNORMAL HIGH (ref 0.4–1.2)
Glucose, Bld: 89 mg/dL (ref 70–99)
Potassium: 3.7 mEq/L (ref 3.5–5.1)

## 2010-12-08 LAB — SEDIMENTATION RATE: Sed Rate: 26 mm/hr — ABNORMAL HIGH (ref 0–22)

## 2010-12-08 NOTE — Discharge Summary (Signed)
[name], [name] NO.:  [id number]  MEDICAL RECORD NO.:  [id number]           PATIENT TYPE:  LOCATION:                                 FACILITY:  PHYSICIAN:  Karen Blower, MD       DATE OF BIRTH:  [birth year]  DATE OF ADMISSION: DATE OF DISCHARGE:                              DISCHARGE SUMMARY   PRIMARY CARE PHYSICIAN:  Dr. Drue Harrison at [place] [facility].  DISCHARGE DIAGNOSES: 1. Urinary tract infection. 2. Generalized weakness, multifactorial. 3. Hypertension. 4. Hypothyroidism. 5. History of peripheral vascular disease with left lower extremity     ulcer, stable. 6. History of CVA with TIA, stable. 7. History of Parkinson disease. 8. History of dementia. 9. History of giant cell arteritis, currently on prednisone for     elevated ESR. 10.Hyperlipidemia. 11.History of iron-deficiency anemia. 12.Chronic left foot wound from peripheral vascular disease followed     by Dr. Cheryll Harrison with Wound Care. 13.History of sinus bradycardia secondary to beta-blocker. 14.Deconditioning. 15.History of syncope.  DISCHARGE MEDICATION: 1. Amlodipine 2.5 mg p.o. daily. 2. Ceftriaxone 1 g IV daily at bedtime. 3. Aggrenox 1 capsule p.o. twice daily. 4. Donepezil 10 mg p.o. daily at bedtime. 5. Calcium carbonate 600 mg/vitamin D2 tablets twice daily. 6. Carbidopa/levodopa 25/100 mg p.o. twice daily. 7. Ferrous sulfate 325 mg 3 times a day with meals. 8. Levothyroxine 150 mcg p.o. q.a.m. 9. MiraLax 1 packet daily as needed for constipation. 10.Multivitamin 1 tablet p.o. q.a.m. 11.Omeprazole 20 mg daily at bedtime. 12.Prednisone 10 mg p.o. q.a.m. 13.Simvastatin 20 mg daily at bedtime. 14.Tylenol 325 mg p.o. daily. 15.Vitamin D3 2000 units p.o. q.p.m.  BRIEF ADMITTING HISTORY AND PHYSICAL:  [name] is a 77 year old Caucasian female with multiple medical conditions with recent history of recurrent UTI, who presented with nausea, vomiting, generalized weakness,  and partially treated UTI.  RADIOLOGY/IMAGING:  The patient had a chest x-ray on December 03, 2010, which showed increased central venous pulmonary congestion, chronic bronchitis markings.  LABORATORY DATA:  CBC shows white count of 6.3, hemoglobin 10.4, hematocrit 32.5, platelet count 231.  Electrolytes normal with a creatinine of 1.07.  Liver function tests normal except albumin of 3.1. Initial lactic acid on presentation was 2.9, lipase was 28.  Troponin initially was 0.08, then 0.06.  TSH is 0.207, free T4 is 1.45.  UA was negative for nitrates and leukocytes.  Blood cultures x2 shows no growth to date.  Urine culture grew Proteus mirabilis with sensitivities pending.  HOSPITAL COURSE BY PROBLEM: 1. Partially treated urinary tract infection.  Initially, the patient     was on Bactrim at home; however, the patient has been having nausea     and vomiting, suspect that the patient may have intolerance to     Bactrim.  She also has a history of allergies listed to     ciprofloxacin and nitrofurantoin.  As a result, decision was made    to put a PICC line in and to treat her urinary tract infection with     IV ceftriaxone.  The patient initially on hospitalization was     started on  ceftriaxone.  After discharge, she will be on     ceftriaxone for 4 more days to complete a 7-day course of     ceftriaxone.  I spoke with [name] with Infectious Diseases     who reported that [facility] has 90% sensitivity to Proteus mirabilis as     a result, even though the sensitivities for Proteus mirabilis are     still pending.  I will discharge her home on ceftriaxone.  Will     need to follow up with her primary care physician for further     management. 2. Generalized weakness, multifactorial, most likely due to     deconditioning and urinary tract infection.  The patient was     evaluated by Physical Therapy who recommended home health physical     therapy. 3. Hypertension.  Discontinued  hydrochlorothiazide and started her on     amlodipine 2.5 mg p.o. day.  Further titration of antihypertensive     medications will be done as an outpatient. 4. Mild dehydration, resolved with IV hydration. 5. Hypothyroidism.  The patient's TSH was low, but free T4 was normal.     I am uncertain when the dose of Synthroid was adjusted by her     primary care physician.  We will defer to her primary care     physician and make new further adjustments to her Synthroid as an     outpatient. 6. History of peripheral vascular disease with left lower extremity     ulcer, stable.  Continue management of wound care as an outpatient.     Ulcer is less than half centimeter at her heel. 7. History of CVA with TIA, was not an active issue at this time. 8. History of Parkinson disease, stable. 9. History of dementia, stable. 10.History of giant cell arteritis, currently is on prednisone for     elevated ESR, does not report jaw claudication or any other     symptoms to suggest an ongoing disease.  We will defer to Dr. Sandria Harrison     her neurologist to help manage on the titration of the prednisone. 11.Hyperlipidemia, continue statin. 12.History of iron-deficiency anemia, continue supplemental iron.     Hemoglobin is stable during the posthospital stay, drop in     hemoglobin suspected due to delusional reasons.  DISPOSITION AND FOLLOWUP:  The patient is to follow up with Dr. Drue Harrison, her primary care physician in 1 week.  The patient is to follow up with her urologist in 1 week.  Time spent on discharge talking to the patient and coordinating care was 40 minutes.     Karen Blower, MD     SR/MEDQ  D:  12/06/2010  T:  12/07/2010  Job:  [id number]  Electronically Signed by [name]  on 12/08/2010 12:09:04 AM

## 2010-12-09 LAB — CBC
HCT: 34.4 % — ABNORMAL LOW (ref 36.0–46.0)
HCT: 35.5 % — ABNORMAL LOW (ref 36.0–46.0)
HCT: 37.2 % (ref 36.0–46.0)
Hemoglobin: 10.6 g/dL — ABNORMAL LOW (ref 12.0–15.0)
Hemoglobin: 11.6 g/dL — ABNORMAL LOW (ref 12.0–15.0)
Hemoglobin: 11.6 g/dL — ABNORMAL LOW (ref 12.0–15.0)
MCH: 29.5 pg (ref 26.0–34.0)
MCH: 29.8 pg (ref 26.0–34.0)
MCHC: 31.5 g/dL (ref 30.0–36.0)
MCHC: 31.8 g/dL (ref 30.0–36.0)
MCHC: 32 g/dL (ref 30.0–36.0)
MCHC: 32.4 g/dL (ref 30.0–36.0)
MCV: 89.8 fL (ref 78.0–100.0)
MCV: 93.7 fL (ref 78.0–100.0)
Platelets: 322 10*3/uL (ref 150–400)
Platelets: 351 10*3/uL (ref 150–400)
Platelets: 366 10*3/uL (ref 150–400)
Platelets: 366 10*3/uL (ref 150–400)
Platelets: 371 10*3/uL (ref 150–400)
Platelets: 386 10*3/uL (ref 150–400)
RBC: 3.93 MIL/uL (ref 3.87–5.11)
RBC: 4 MIL/uL (ref 3.87–5.11)
RDW: 14.3 % (ref 11.5–15.5)
RDW: 14.4 % (ref 11.5–15.5)
RDW: 14.4 % (ref 11.5–15.5)
RDW: 14.5 % (ref 11.5–15.5)
RDW: 14.6 % (ref 11.5–15.5)
WBC: 10 10*3/uL (ref 4.0–10.5)
WBC: 10.9 10*3/uL — ABNORMAL HIGH (ref 4.0–10.5)
WBC: 6.3 10*3/uL (ref 4.0–10.5)
WBC: 9.3 10*3/uL (ref 4.0–10.5)
WBC: 9.9 10*3/uL (ref 4.0–10.5)

## 2010-12-09 LAB — BASIC METABOLIC PANEL
BUN: 12 mg/dL (ref 6–23)
BUN: 9 mg/dL (ref 6–23)
CO2: 25 mEq/L (ref 19–32)
CO2: 27 mEq/L (ref 19–32)
CO2: 28 mEq/L (ref 19–32)
Calcium: 7.9 mg/dL — ABNORMAL LOW (ref 8.4–10.5)
Calcium: 8.1 mg/dL — ABNORMAL LOW (ref 8.4–10.5)
Calcium: 8.3 mg/dL — ABNORMAL LOW (ref 8.4–10.5)
Calcium: 8.7 mg/dL (ref 8.4–10.5)
Chloride: 100 mEq/L (ref 96–112)
Chloride: 103 mEq/L (ref 96–112)
Chloride: 105 mEq/L (ref 96–112)
Chloride: 107 mEq/L (ref 96–112)
Creatinine, Ser: 0.87 mg/dL (ref 0.4–1.2)
Creatinine, Ser: 0.87 mg/dL (ref 0.4–1.2)
Creatinine, Ser: 0.9 mg/dL (ref 0.4–1.2)
Creatinine, Ser: 1.05 mg/dL (ref 0.4–1.2)
GFR calc Af Amer: >60 mL/min (ref >60)
GFR calc Af Amer: >60 mL/min (ref >60)
GFR calc Af Amer: >60 mL/min (ref >60)
GFR calc Af Amer: >60 mL/min (ref >60)
GFR calc non Af Amer: 51 mL/min — ABNORMAL LOW (ref >60)
GFR calc non Af Amer: 54 mL/min — ABNORMAL LOW (ref >60)
GFR calc non Af Amer: >60 mL/min (ref >60)
GFR calc non Af Amer: >60 mL/min (ref >60)
Glucose, Bld: 81 mg/dL (ref 70–99)
Glucose, Bld: 98 mg/dL (ref 70–99)
Potassium: 3.5 mEq/L (ref 3.5–5.1)
Potassium: 4.5 mEq/L (ref 3.5–5.1)
Sodium: 137 mEq/L (ref 135–145)
Sodium: 139 mEq/L (ref 135–145)
Sodium: 142 mEq/L (ref 135–145)
Sodium: 142 mEq/L (ref 135–145)

## 2010-12-09 LAB — LIPID PANEL
Cholesterol: 162 mg/dL (ref 0–200)
HDL: 93 mg/dL (ref >39)
LDL Cholesterol: 57 mg/dL (ref 0–99)
Total CHOL/HDL Ratio: 1.7 RATIO
Triglycerides: 60 mg/dL (ref <150)

## 2010-12-09 LAB — POCT I-STAT, CHEM 8
BUN: 21 mg/dL (ref 6–23)
Chloride: 99 mEq/L (ref 96–112)
Creatinine, Ser: 1.3 mg/dL — ABNORMAL HIGH (ref 0.4–1.2)
Potassium: 4 mEq/L (ref 3.5–5.1)
Sodium: 138 mEq/L (ref 135–145)

## 2010-12-09 LAB — DIFFERENTIAL
Basophils Absolute: 0 10*3/uL (ref 0.0–0.1)
Eosinophils Absolute: 0 10*3/uL (ref 0.0–0.7)
Lymphocytes Relative: 23 % (ref 12–46)
Lymphs Abs: 2.7 10*3/uL (ref 0.7–4.0)
Monocytes Absolute: 0.5 10*3/uL (ref 0.1–1.0)
Monocytes Absolute: 0.5 10*3/uL (ref 0.1–1.0)
Monocytes Relative: 5 % (ref 3–12)
Neutro Abs: 8.6 10*3/uL — ABNORMAL HIGH (ref 1.7–7.7)
Neutrophils Relative %: 68 % (ref 43–77)

## 2010-12-09 LAB — CARDIAC PANEL(CRET KIN+CKTOT+MB+TROPI)
CK, MB: 2.3 ng/mL (ref 0.3–4.0)
Relative Index: INVALID (ref 0.0–2.5)
Total CK: 36 U/L (ref 7–177)
Troponin I: 0.03 ng/mL (ref 0.00–0.06)

## 2010-12-09 LAB — T4, FREE: Free T4: 1.74 ng/dL (ref 0.80–1.80)

## 2010-12-09 LAB — HEPATIC FUNCTION PANEL
ALT: 20 U/L (ref 0–35)
AST: 33 U/L (ref 0–37)
Albumin: 2.9 g/dL — ABNORMAL LOW (ref 3.5–5.2)
Alkaline Phosphatase: 76 U/L (ref 39–117)
Total Bilirubin: 0.7 mg/dL (ref 0.3–1.2)
Total Protein: 6.7 g/dL (ref 6.0–8.3)

## 2010-12-09 LAB — APTT: aPTT: 27 seconds (ref 24–37)

## 2010-12-09 LAB — COMPREHENSIVE METABOLIC PANEL
ALT: 18 U/L (ref 0–35)
AST: 23 U/L (ref 0–37)
Albumin: 3 g/dL — ABNORMAL LOW (ref 3.5–5.2)
Albumin: 3.2 g/dL — ABNORMAL LOW (ref 3.5–5.2)
Calcium: 10.3 mg/dL (ref 8.4–10.5)
Calcium: 9.2 mg/dL (ref 8.4–10.5)
Creatinine, Ser: 1.11 mg/dL (ref 0.4–1.2)
GFR calc Af Amer: 54 mL/min — ABNORMAL LOW (ref >60)
GFR calc Af Amer: 58 mL/min — ABNORMAL LOW (ref >60)
GFR calc non Af Amer: 48 mL/min — ABNORMAL LOW (ref >60)
Glucose, Bld: 109 mg/dL — ABNORMAL HIGH (ref 70–99)
Sodium: 139 mEq/L (ref 135–145)
Total Protein: 6.4 g/dL (ref 6.0–8.3)

## 2010-12-09 LAB — GLUCOSE, CAPILLARY
Glucose-Capillary: 104 mg/dL — ABNORMAL HIGH (ref 70–99)
Glucose-Capillary: 106 mg/dL — ABNORMAL HIGH (ref 70–99)
Glucose-Capillary: 114 mg/dL — ABNORMAL HIGH (ref 70–99)
Glucose-Capillary: 119 mg/dL — ABNORMAL HIGH (ref 70–99)
Glucose-Capillary: 119 mg/dL — ABNORMAL HIGH (ref 70–99)
Glucose-Capillary: 125 mg/dL — ABNORMAL HIGH (ref 70–99)
Glucose-Capillary: 126 mg/dL — ABNORMAL HIGH (ref 70–99)
Glucose-Capillary: 130 mg/dL — ABNORMAL HIGH (ref 70–99)
Glucose-Capillary: 155 mg/dL — ABNORMAL HIGH (ref 70–99)
Glucose-Capillary: 90 mg/dL (ref 70–99)
Glucose-Capillary: 94 mg/dL (ref 70–99)
Glucose-Capillary: 95 mg/dL (ref 70–99)

## 2010-12-09 LAB — CULTURE, BLOOD (ROUTINE X 2): Culture  Setup Time: 201109240151

## 2010-12-09 LAB — PROTIME-INR
INR: 0.89 (ref 0.00–1.49)
INR: 0.9 (ref 0.00–1.49)
INR: 0.91 (ref 0.00–1.49)
INR: 0.94 (ref 0.00–1.49)
INR: 0.99 (ref 0.00–1.49)
INR: 1.03 (ref 0.00–1.49)
Prothrombin Time: 12.4 seconds (ref 11.6–15.2)
Prothrombin Time: 12.8 seconds (ref 11.6–15.2)
Prothrombin Time: 13.3 seconds (ref 11.6–15.2)

## 2010-12-09 LAB — ANA: Anti Nuclear Antibody(ANA): NEGATIVE

## 2010-12-09 LAB — URINALYSIS, ROUTINE W REFLEX MICROSCOPIC
Ketones, ur: 15 mg/dL — AB
Nitrite: NEGATIVE
Protein, ur: NEGATIVE mg/dL

## 2010-12-09 LAB — HEMOGLOBIN A1C: Mean Plasma Glucose: 120 mg/dL — ABNORMAL HIGH (ref <117)

## 2010-12-09 LAB — MRSA PCR SCREENING: MRSA by PCR: NEGATIVE

## 2010-12-09 LAB — RHEUMATOID FACTOR: Rhuematoid fact SerPl-aCnc: <20 IU/mL (ref 0–20)

## 2010-12-10 LAB — CULTURE, BLOOD (ROUTINE X 2)
Culture  Setup Time: 201203100207
Culture: NO GROWTH
Culture: NO GROWTH

## 2011-01-04 ENCOUNTER — Encounter (HOSPITAL_BASED_OUTPATIENT_CLINIC_OR_DEPARTMENT_OTHER): Payer: Medicare Other | Attending: Internal Medicine

## 2011-01-04 DIAGNOSIS — L97509 Non-pressure chronic ulcer of other part of unspecified foot with unspecified severity: Secondary | ICD-10-CM | POA: Insufficient documentation

## 2011-01-11 NOTE — H&P (Signed)
[name], [name] NO.:  [id number]  MEDICAL RECORD NO.:  [id number]           PATIENT TYPE:  E  LOCATION:  [facility]                         FACILITY:  [facility]  PHYSICIAN:  Lonia Blood, M.D.      DATE OF BIRTH:  12-03-1929  DATE OF ADMISSION:  12/03/2010 DATE OF DISCHARGE:                             HISTORY & PHYSICAL   PRIMARY CARE PHYSICIAN:  [facility], Dr. Drue Novel.  PRESENTING COMPLAINT:  Nausea, vomiting, and generalized weakness.  HISTORY OF PRESENT ILLNESS:  The patient is a 76 year old female with multiple medical problems who has been dealing with some recurring UTI's.  She was last treated in February of this year and was admitted in the hospital.  She has apparently being going up and down.  She went to Dr. Luan Pulling office and was diagnosed with progressive weakness and probably recurring UTI.  She was given antibiotics 2 days ago, which she started taking.  The antibiotics included sulfur.  Since that she has been now having nausea, vomiting and she has become so weak.  The patient has had prior reaction to Cipro antibiotic that per family led to some levels of  somehow TIA's.  She took that antibiotic and seems like she is also not tolerating it as well.  She denied any fever or chills at this point, but she is generally very weak and unable to keep food or medications down.  She has had previous one again in February as indicated.  PAST MEDICAL HISTORY:  Recurring UTIs.  The patient apparently has seen a urologist in the past and was diagnosed with small contracted bladder. History of cerebrovascular accident with recurrent TIAs, dehydration, history of syncope, hypertension, hyperlipidemia, history of hypothyroidism, Parkinson disease, peripheral neuropathy, peripheral vascular disease with left lower extremity ulcer, which has been healing nicely mild dementia, history of giant-cell arthritis, history of colon cancer.  PAST SURGICAL  HISTORY:  Status post cholecystectomy, status post hernia repair, status post hysterectomy, status post tonsillectomy, status post rotator cuff repair in 2004, history of temporal artery biopsy in 2011, history of cataract removal of her right eye also in 2011.  Her colon resection was in 1987, where 18 inches of bowel was removed.  She is also status post appendectomy back in 1947.  ALLERGIES:  Codeine, morphine, Demerol, and Cipro.  CURRENT MEDICATIONS:  Aggrenox, Aggrenox, Aricept, Cymbalta, Detrol, hydrochlorothiazide, Lopressor, multivitamins, Os-Cal Ultra.  She is on prednisone, Protonix, Sinemet, Synthroid, Tylenol, vitamin D2, and Zocor.  Also, on ferrous sulfate.  Her full home medications will be obtained in due course.  SOCIAL HISTORY:  The patient currently lives with her daughter and son- in-law.  She has apparently been doing well with her ADLs.  No tobacco, alcohol or IV drug use.  SOCIAL HISTORY:  The patient used to smoke but quit many years ago.  Her current daughter that she lives with is [employer]. Also, she has a son who lives in [place] at the moment.  FAMILY HISTORY:  Significant for father died with an MI at an old age rather.  Her mother had dementia, one of the daughters  is healthy and the other son is also healthy.  REVIEW OF SYSTEMS:  All systems reviewed are currently negative except per HPI.  PHYSICAL EXAMINATION:  VITAL SIGNS:  Temperature 99.0 rectally, blood pressure is 192/90 with a pulse of 96, respiratory 20, and sats  100% on room air. GENERAL:  The patient looks weak but in no acute distress. HEENT: PERRL.  EOMI.  No pallor, no jaundice.  No rhinorrhea. NECK:  Supple.  No JVD.  No lymphadenopathy. RESPIRATORY:  She has good air entry bilaterally.  No wheezes.  No rales.  No crackles. CARDIOVASCULAR SYSTEM:  S1, S2.  No murmur. ABDOMEN:  Soft, full, nontender with positive bowel sounds. EXTREMITIES:  No edema, cyanosis or clubbing.  She  has a few ulcers on the left lower extremity including the heel, below the knees have left big toe but they all seem to be healing well. MUSCULOSKELETAL:  No exact joint swelling or tenderness. SKIN:  No other rashes or ulcers.  LABORATORY DATA:  Her BNP is 119.  Initial cardiac markers are negative. Lactic acid level 2.9.  Her lipase is 28.  Sodium is 134, potassium 3.7, chloride 96, CO2 of 27, glucose 98, BUN 14, creatinine 1.34, total bilirubin is 0.6, albumin 3.2, calcium 9.3 with the rest of the LFTs within normal.  Her urinalysis showed cloudy urine with a pH of 8.5, otherwise negative.  White count is 10.0, hemoglobin 11.1, and platelet 277.  Her chest x-ray showed increase in central venous pulmonary congestion and chronic bronchitic markings, otherwise no infiltrates.  ASSESSMENT:  This is a 77 year old female presented with profound weakness that on neurologic exam showed no focal findings.  She has no neuropathy and recent urinary tract infection that was partially treated.  I suspect the patient's symptoms are due to partially treated urinary tract infection leading to some dehydration.  She may have also reactive to sulfa which lead to nausea, vomiting and subsequently the dehydration and generalized weakness.  The patient may be allergic to sulfa as well as being allergic to Cipro that is known from previous workup.  PLAN: 1. Generalized weakness.  We will admit the patient for observation.     No reasons and also get a new urine culture.  I will put her on IV     Rocephin at this point.  Follow her response in the hospital     closely.  Control her nausea, vomiting and once is controlled, we     will transition her to some oral antibiotics.  We will avoid     quinolones as well as sulfa drugs. 2. Partially treated UTI.  Again, I will put her on Rocephin in the     hospital. 3. Hypertension.  As soon as her nausea, vomiting is controlled, we     will start her back on  her oral blood pressure medicines.  In the     meantime, if needed, we will use some IV Lopressor. 4. Hypothyroidism.  I will continue with levothyroxine and also     recheck her TSH. 5. Left lower extremity ulcer.  This seems like is healing nicely and     so will not initiate any new workup.  The rest of her other medical     problems seem to be stable at this point and we will continue with     her home care.     Lonia Blood, M.D.    Verlin Grills  D:  12/04/2010  T:  12/04/2010  Job:  [id number]  Electronically Signed by Lonia Blood M.D. on 01/11/2011 03:33:31 PM

## 2011-01-24 ENCOUNTER — Emergency Department (HOSPITAL_COMMUNITY): Payer: Medicare Other

## 2011-01-24 ENCOUNTER — Emergency Department (HOSPITAL_COMMUNITY)
Admission: EM | Admit: 2011-01-24 | Discharge: 2011-01-25 | Disposition: A | Discharge status: Home / Self Care | Payer: Medicare Other | Attending: Emergency Medicine | Admitting: Emergency Medicine

## 2011-01-24 DIAGNOSIS — R0682 Tachypnea, not elsewhere classified: Secondary | ICD-10-CM | POA: Insufficient documentation

## 2011-01-24 DIAGNOSIS — Z79899 Other long term (current) drug therapy: Secondary | ICD-10-CM | POA: Insufficient documentation

## 2011-01-24 DIAGNOSIS — R0989 Other specified symptoms and signs involving the circulatory and respiratory systems: Secondary | ICD-10-CM | POA: Insufficient documentation

## 2011-01-24 DIAGNOSIS — R0609 Other forms of dyspnea: Secondary | ICD-10-CM | POA: Insufficient documentation

## 2011-01-24 DIAGNOSIS — G20A1 Parkinson's disease without dyskinesia, without mention of fluctuations: Secondary | ICD-10-CM | POA: Insufficient documentation

## 2011-01-24 DIAGNOSIS — F068 Other specified mental disorders due to known physiological condition: Secondary | ICD-10-CM | POA: Insufficient documentation

## 2011-01-24 DIAGNOSIS — G20 Parkinson's disease: Secondary | ICD-10-CM | POA: Insufficient documentation

## 2011-01-24 DIAGNOSIS — E785 Hyperlipidemia, unspecified: Secondary | ICD-10-CM | POA: Insufficient documentation

## 2011-01-24 DIAGNOSIS — I10 Essential (primary) hypertension: Secondary | ICD-10-CM | POA: Insufficient documentation

## 2011-01-24 DIAGNOSIS — R1110 Vomiting, unspecified: Secondary | ICD-10-CM | POA: Insufficient documentation

## 2011-01-24 DIAGNOSIS — Z8673 Personal history of transient ischemic attack (TIA), and cerebral infarction without residual deficits: Secondary | ICD-10-CM | POA: Insufficient documentation

## 2011-01-24 DIAGNOSIS — R000 Tachycardia, unspecified: Secondary | ICD-10-CM | POA: Insufficient documentation

## 2011-01-24 DIAGNOSIS — E039 Hypothyroidism, unspecified: Secondary | ICD-10-CM | POA: Insufficient documentation

## 2011-01-24 DIAGNOSIS — R5381 Other malaise: Secondary | ICD-10-CM | POA: Insufficient documentation

## 2011-01-24 LAB — COMPREHENSIVE METABOLIC PANEL
AST: 32 U/L (ref 0–37)
Albumin: 3.4 g/dL — ABNORMAL LOW (ref 3.5–5.2)
BUN: 17 mg/dL (ref 6–23)
Calcium: 9.5 mg/dL (ref 8.4–10.5)
Creatinine, Ser: 1.2 mg/dL (ref 0.4–1.2)
GFR calc Af Amer: 53 mL/min — ABNORMAL LOW (ref >60)
Total Protein: 7.2 g/dL (ref 6.0–8.3)

## 2011-01-24 LAB — POCT I-STAT 3, VENOUS BLOOD GAS (G3P V)
Acid-Base Excess: 8 mmol/L — ABNORMAL HIGH (ref 0.0–2.0)
Bicarbonate: 31.5 mEq/L — ABNORMAL HIGH (ref 20.0–24.0)
O2 Saturation: 43 %
Patient temperature: 98.5
TCO2: 33 mmol/L (ref 0–100)
pCO2, Ven: 37.4 mmHg — ABNORMAL LOW (ref 45.0–50.0)
pH, Ven: 7.533 — ABNORMAL HIGH (ref 7.250–7.300)
pO2, Ven: 21 mmHg — CL (ref 30.0–45.0)

## 2011-01-24 LAB — CBC
HCT: 38.2 % (ref 36.0–46.0)
Hemoglobin: 12.8 g/dL (ref 12.0–15.0)
MCH: 31.3 pg (ref 26.0–34.0)
MCHC: 33.5 g/dL (ref 30.0–36.0)
MCV: 93.4 fL (ref 78.0–100.0)
Platelets: 291 K/uL (ref 150–400)
RBC: 4.09 MIL/uL (ref 3.87–5.11)
RDW: 15.7 % — ABNORMAL HIGH (ref 11.5–15.5)
WBC: 8.3 10*3/uL (ref 4.0–10.5)

## 2011-01-24 LAB — URINALYSIS, ROUTINE W REFLEX MICROSCOPIC
Bilirubin Urine: NEGATIVE
Glucose, UA: NEGATIVE mg/dL
Hgb urine dipstick: NEGATIVE
Ketones, ur: NEGATIVE mg/dL
Nitrite: NEGATIVE
Protein, ur: NEGATIVE mg/dL
Specific Gravity, Urine: 1.016 (ref 1.005–1.030)
Urobilinogen, UA: 0.2 mg/dL (ref 0.0–1.0)
pH: 7.5 (ref 5.0–8.0)

## 2011-01-24 LAB — COMPREHENSIVE METABOLIC PANEL WITH GFR
ALT: 16 U/L (ref 0–35)
Alkaline Phosphatase: 56 U/L (ref 39–117)
CO2: 29 meq/L (ref 19–32)
Chloride: 98 meq/L (ref 96–112)
GFR calc non Af Amer: 43 mL/min — ABNORMAL LOW (ref >60)
Glucose, Bld: 116 mg/dL — ABNORMAL HIGH (ref 70–99)
Potassium: 4.9 meq/L (ref 3.5–5.1)
Sodium: 136 meq/L (ref 135–145)
Total Bilirubin: 0.7 mg/dL (ref 0.3–1.2)

## 2011-01-24 LAB — DIFFERENTIAL
Basophils Absolute: 0 10*3/uL (ref 0.0–0.1)
Basophils Relative: 0 % (ref 0–1)
Eosinophils Absolute: 0 K/uL (ref 0.0–0.7)
Eosinophils Relative: 0 % (ref 0–5)
Lymphocytes Relative: 14 % (ref 12–46)
Lymphs Abs: 1.1 K/uL (ref 0.7–4.0)
Monocytes Absolute: 0.4 K/uL (ref 0.1–1.0)
Monocytes Relative: 5 % (ref 3–12)
Neutro Abs: 6.7 10*3/uL (ref 1.7–7.7)
Neutrophils Relative %: 81 % — ABNORMAL HIGH (ref 43–77)

## 2011-01-24 LAB — POCT CARDIAC MARKERS
CKMB, poc: 2.1 ng/mL (ref 1.0–8.0)
Myoglobin, poc: 150 ng/mL (ref 12–200)
Troponin i, poc: <0.05 ng/mL (ref 0.00–0.09)

## 2011-01-24 LAB — LIPASE, BLOOD: Lipase: 30 U/L (ref 11–59)

## 2011-01-27 ENCOUNTER — Encounter (HOSPITAL_BASED_OUTPATIENT_CLINIC_OR_DEPARTMENT_OTHER): Payer: Medicare Other | Attending: Internal Medicine

## 2011-01-27 DIAGNOSIS — L989 Disorder of the skin and subcutaneous tissue, unspecified: Secondary | ICD-10-CM | POA: Insufficient documentation

## 2011-01-27 DIAGNOSIS — I739 Peripheral vascular disease, unspecified: Secondary | ICD-10-CM | POA: Insufficient documentation

## 2011-01-27 DIAGNOSIS — G589 Mononeuropathy, unspecified: Secondary | ICD-10-CM | POA: Insufficient documentation

## 2011-02-08 NOTE — Assessment & Plan Note (Signed)
OFFICE VISIT   [name], SCHNEEBERGER A  DOB:  [birth year]                                       06/10/2010  ONGEX#:[id number]   CHIEF COMPLAINT:  Left foot pain.   HISTORY OF PRESENT ILLNESS:  The patient is a 77 year old female  referred by Dr. Sandria Manly for evaluation of a cool dusky left lower  extremity.  The patient has some memory deficits but the daughter states  that the left foot developed a purple color approximately 4 days ago.  She states that this may have been present actually though for several  weeks.  She states that it has improved somewhat today.  The patient  usually ambulates with a walker but is a high fall risk because her  knees tend to give way.  She has had several falls over the last few  days because the left foot has been sore.  She has been minimally  ambulatory over that time.  She lives at home with her daughter.  In  addition, she has just been diagnosed with a urinary tract infection and  was not feeling well during the office visit today and is actually  having some rigors during the office visit.   CHRONIC MEDICAL PROBLEMS:  Include elevated cholesterol, giant cell  arteritis, memory loss, peripheral neuropathy, prior stroke.  These are  all currently controlled and have been followed by Dr. Sandria Manly.   PAST SURGICAL HISTORY:  Tonsillectomy, appendectomy, cholecystectomy,  rotator cuff repair, hysterectomy, cataract removal and a colon  resection.   SOCIAL HISTORY:  She is retired.  She is widowed.  She does not smoke or  consume alcohol.   FAMILY HISTORY:  Remarkable for her brother who had vascular disease at  a young age.   REVIEW OF SYSTEMS:  Full 12 point review of systems was performed with  the patient today.  Please see intake referral form for details  regarding this.   MEDICATIONS:  Aggrenox, Levothroid, Detrol LA, hydrochlorothiazide,  Aricept, Prilosec, simvastatin, stool softener, multivitamin, Cymbalta,  prednisone, Tylenol, vitamin D3 and Caltrate.   She has allergies listed to morphine, Demerol and codeine.   PHYSICAL EXAM:  Vital signs:  Blood pressure is 174/89 in the left arm,  heart rate is 105 and regular.  Temperature is 98.2, respirations 22.  HEENT:  Unremarkable.  Neck:  Has 2+ carotid pulses without bruit.  Chest:  Clear to auscultation.  Cardiac:  Exam is regular rate and  rhythm although tachycardia with an occasional irregular beat.  Abdomen:  Is soft, nontender, nondistended.  No masses.  Extremities:  She has a  1+ right femoral pulse.  She has a 2+ left femoral pulse.  She has  absent popliteal and pedal pulses bilaterally.  She has 2+ edema in the  lower extremities from the knee down bilaterally.  Musculoskeletal:  Exam otherwise showed no major joint deformities.  Neurologic:  She has  bilateral 4/5 motor strength in the lower extremities.  Skin:  Has no  ulcers or rashes.  The left foot is dusky but has fairly brisk capillary  refill but does have the appearance of acute on chronic ischemia.  The  foot is nontender to palpation.   She had a 12-lead EKG performed today at [facility] which showed sinus  tachycardia, no evidence of atrial fibrillation.  She had bilateral ABIs  performed at [facility] on September 13 which showed an ABI on  the left side of 0.4 and on the right of 0.87.   On my exam today she did have dorsalis pedis and posterior tibial  Doppler flow to the left foot.   In summary, the patient has had an acute decrease in circulation to her  left lower extremity.  This is most likely a phenomena of worsening of a  chronic condition.  She does not have atrial fibrillation or other  embolic type etiology that I can find.  I believe the best option for  her would be an aortogram with lower extremity runoff with possible  percutaneous revascularization versus consideration for bypass or  thrombectomy of the left lower extremity.  I have arranged for  this for  06/18/2010.  Hopefully she will be over her urinary tract infection  which has made her fairly ill currently.  I do not believe she is a  candidate for the arteriogram while she is currently having rigors and  apparently had an elevated white blood cell count of 18,000 today.  She  will report to short stay on September 23 for arteriogram as long as her  symptoms of urinary tract infection have resolved.  If her symptoms in  her left foot become worse over time she will call me back and we will  reconsider that plan.     Janetta Hora. Fields, MD  Electronically Signed   CEF/MEDQ  D:  06/10/2010  T:  06/11/2010  Job:  [id number]   cc:   Dr Toniann Fail. Love, M.D.

## 2011-02-08 NOTE — Assessment & Plan Note (Signed)
OFFICE VISIT   OZELLA, COMINS A  DOB:  October 14, 1929                                       07/15/2010  JWJXB#:[id number]   The patient returns for followup today.  She was originally scheduled to  have an arteriogram 3 weeks ago but at the time of presentation to [facility] short stay she was very confused and lethargic.  She had a 3 week  hospital stay at [facility] and was treated for urosepsis, mild  dementia, her gait disorder as well as aphasia and Parkinson's disease.  She had several medication adjustments and is now much more back to  herself.   Currently she denies any rest pain in her left foot.  She has no  ulcerations on the foot.  She states that the swelling in her lower  extremities has improved somewhat.  She denies any shortness of breath  or chest pain on review of systems.  She is alert and oriented x3 on the  office visit today.  She is ambulatory.   PHYSICAL EXAM:  She has bilateral femoral pulses 1+ on the right, 2+ on  the left.  She has absent popliteal and pedal pulses bilaterally.  She  has no real significant edema in the lower extremities at this point.  The left foot is dusky in appearance with delayed capillary refill  compared to the right.  The right is approximately 2-3 seconds, the left  is 5-7 seconds.   At this point the patient seems to have recovered from her multiple  medical problems.  Her ABIs on the left side are decreased at 0.4.  I  believe the best option at this point is reschedule her for her  aortogram lower extremity runoff with a focus on the left leg.  This is  scheduled for 07/30/2010.  Risks, benefits, possible complications and  procedure details were reviewed with the patient and her daughter today.     Janetta Hora. Fields, MD  Electronically Signed   CEF/MEDQ  D:  07/15/2010  T:  07/16/2010  Job:  [id number]   cc:   Genene Churn. Love, M.D.  Dr Lynnae Prude

## 2011-02-11 NOTE — Op Note (Signed)
NAMEURSALA, [name] NO.:  [id number]   MEDICAL RECORD NO.:  [id number]          PATIENT TYPE:  OUT   LOCATION:  [facility]                          FACILITY:  [facility]   PHYSICIAN:  Genene Churn. Love, M.D.    DATE OF BIRTH:  [birth year]   DATE OF PROCEDURE:  10/07/2004  DATE OF DISCHARGE:                                 OPERATIVE REPORT   This 77 year old patient has been followed for a gait disorder and has had  numbness in her feet and legs thought to represent an axonal neuropathy.  She has also had memory loss and difficulty for two years with bladder  incontinence requiring her to use a pad.  She has not had any other new  neurologic symptomatology.  She is thought to have an axonal neuropathy.  There has been a question of NPH by clinical symptomatology.   PROCEDURE NOTE:  The patient was prepped and draped in the usual manner with  Betadine and 1% Xylocaine.  The L3-4 interspace was entered without  difficulty.  The opening pressure was 190 mmH20 and 30 mL of clear colorless  CNS was obtained.  A stand/walk/sit test with two chairs 20 feet apart was  performed before the LP with a time of 1 minute and 36 seconds and after the  LP with a time of 1 minute and 38 seconds.  This was considered a negative  LP drainage procedure test.  It will be helpful to follow her urinary  incontinence over the next few days.  A CSF protein will be important to  evaluate, as well as the possibility of oligoclonal IgG and plain IgG.  The  patient tolerated the procedure well.       JML/MEDQ  D:  10/07/2004  T:  10/07/2004  Job:  [id number]

## 2011-02-11 NOTE — Op Note (Signed)
   [name], [name]                       ACCOUNT NO.:  [id number]   MEDICAL RECORD NO.:  [id number]                   PATIENT TYPE:  AMB   LOCATION:  ENDO                                 [facility]AC[facility]  [facility]   PHYSICIAN:  James L. Malon Kindle., M.D.          DATE OF BIRTH:  November 14, 1929   DATE OF PROCEDURE:  07/02/2003  DATE OF DISCHARGE:                                 OPERATIVE REPORT   PROCEDURE:  Colonoscopy.   MEDICATIONS:  Fentanyl 62.5 mcg, Versed 6 mg IV.   SCOPE:  Olympus pediatric colonoscope.   INDICATIONS:  A patient with a  history of malignant polyp removed  surgically  in [place] years ago.  This is a followup colonoscopy.  The  patient's grandmother had colon cancer.  She has had a small hyperplastic  polyp.   DESCRIPTION OF PROCEDURE:  The procedure had been explained to the patient  and consent obtained.   With the patient in the left lateral decubitus position, the Olympus  pediatric colonoscope was inserted and advanced.  The prep was excellent.  We advanced easily to the cecum.  The ileocecal valve and appendiceal  orifice were seen.  The  scope was withdrawn.  The ascending colon, hepatic  flexure, transverse colon, splenic flexure, descending and sigmoid colon  showed scattered diverticula.  No polyps were seen throughout.  The mucosa  was normal.  The rectum was free of polyps.  The scope was withdrawn.  The  patient tolerated the procedure well.   ASSESSMENT:  No evidence of further polyps in this woman with a previous  history of colon cancer.   PLAN:  Recommend repeating procedure in five years.  Will recommend yearly  hemoccults.                                                James L. Malon Kindle., M.D.    [name]  D:  07/02/2003  T:  07/02/2003  Job:  [id number]   cc:   Darius Bump, M.D.  [place]  [place] [address]  Fax: [phone]

## 2011-02-11 NOTE — Op Note (Signed)
NAME:  [name], [name]                       ACCOUNT NO.:  [id number]   MEDICAL RECORD NO.:  [id number]                   PATIENT TYPE:  AMB   LOCATION:  [facility]                                  FACILITY:  [facility]   PHYSICIAN:  Katy Fitch. Naaman Plummer., M.D.          DATE OF BIRTH:  [birth year]   DATE OF PROCEDURE:  10/01/2002  DATE OF DISCHARGE:                                 OPERATIVE REPORT   PREOPERATIVE DIAGNOSIS:  Chronic impingement right shoulder with AC  arthropathy and large chronic degenerative rotator cuff tear involving the  entire supraspinatus and infraspinatus with partial Terres minor avulsion.   POSTOPERATIVE DIAGNOSIS:  Chronic impingement right shoulder with AC  arthropathy and large chronic degenerative rotator cuff tear involving the  entire supraspinatus and infraspinatus with partial Terres minor avulsion.   PROCEDURE:  1. Diagnostic arthroscopy right glenohumeral joint with debridement of     extensive intra-articular granulations and confirmation of rotator cuff     tear.  2. Open reconstruction of a chronic degenerative rotator cuff tear involving     the supraspinatus, infraspinatus, and portion of the Terres minor.  3. Resection of distal clavicle, ie, Mumford procedure.   SURGEON:  Katy Fitch. Sypher, M.D.   ASSISTANT:  Jonni Sanger, P.A.   ANESTHESIA:  General orotracheal supplemented by interscalene blocks by the  anesthesiologist Sheldon Silvan, M.D./James Oren Section, M.D.   INDICATIONS FOR PROCEDURE:  The patient is a 77 year old woman who fell in  the summer of 2003 on wet grass sustaining an injury to her right shoulder.  She did not seek immediate medical attention, but noted marked weakness,  stiffness, and difficulty with elevation of her shoulder.   She sought an orthopedic consult in the fall of 2003 and was noted to have  weakness of abduction external rotation and findings compatible with a  rotator cuff tear.  She was referred for an MRI  that documented a type 2  acromion AC arthropathy and signs of full-thickness rotator cuff tear.   The cuff signal was extremely degenerative throughout the entire  supraspinatus, infraspinatus, and Terres minor suggesting a very large  avulsion type tear.   Arrangements were made for reconstructive surgery at this time.   DESCRIPTION OF PROCEDURE:  The patient is brought to the operating room and  placed in the supine position on the operating table.  Following  interscalene block placed in the holding area, she had excellent anesthesia  of her right forequarter.   Following induction of general orotracheal anesthesia, she was carefully  positioned in a beach chair position with the aid of the torso and head  holder designed for shoulder arthroscopy.  The entire right upper extremity  and forequarter prepped with Duraprep and draped with impervious arthroscopy  drapes.   After landmarks were marked with a skin pencil, the arthroscope was  introduced through a standard posterior portal with blunt technique.  Diagnostic arthroscopy revealed intact  articular cartilage surfaces on the  glenoid and humeral head, an intact labrum, an intact long head of the  biceps, satisfactory anchor, and rotator interval. There was a large,  retracted, necrotic tear of the supraspinatus, infraspinatus, and Terres  minor.  There were extensive granulation tissues throughout the joint  consistent with adhesive capsulitis.  These were removed with the ArthroCare  radiofrequency wand and a suction shaver. An extensive debridement of the  granulations was accomplished.   Attention was then directed to reconstruction of the rotator cuff tear.  Given the size and retracted nature of the tear, an open repair was  indicated.  The arthroscopic equipment was removed followed by an incision  extending from the [facility] joint to the anterior middle third junction of the  deltoid muscle.  The deltoid anterior third was  elevated off the acromion  and the [facility] joint identified.  The distal 12 mm of clavicle was removed with  an oscillating saw and the osteophyte at the medial surface of the acromion  was removed with a rongeur.  The acromion was leveled to a type 1 morphology  utilizing an oscillating saw and hand rasp.  After bursectomy allowed  visualization of the cuff tear, the cuff was repaired with a series of three  Fiberwire McLaughlin through-bone sutures and two Bio corkscrew anchors with  a total of four mattress sutures.  The supraspinatus, infraspinatus, and  Terres minor were extensively debrided of necrotic tissue and a large area  of 4 x 2 cm was decorticated on the lateral greater tuberosity creating a  bleeding bone surface for repair of the cuff.   An anatomic reconstruction was achieved. The deltoid was then repaired to  the acromion with mattress sutures of #2 Fiberwire, the dead space created  by this dissection was closed with mattress sutures of #2 Fiberwire followed  by repair of the deltoid split with mattress sutures of 0 Vicryl.   The wounds were thoroughly lavaged with sterile saline prior to closure and  1 gram of Ancef was administered as an IV prophylactic antibiotic.   The wound was repaired with subdermal sutures of 3-0 Vicryl and intradermal  0 Prolene and Steri-Strips.   The patient was placed in a sling, awakened from anesthesia, and transferred  to the recovery room with stable vital signs.  She will be discharged in the  care of her family in 24 hours with anticipated medications of Demerol in  the form of Mepergan Fortis and oral antibiotics in the form of Keflex 500  mg one p.o. q.8h. x4 days for prophylactic antibiotic.                                               Katy Fitch Naaman Plummer., M.D.    RVS/MEDQ  D:  10/01/2002  T:  10/01/2002  Job:  [id number]   cc:   Darius Bump, M.D.  [place]  [place] [address] Fax: [phone]

## 2011-03-03 ENCOUNTER — Encounter (HOSPITAL_BASED_OUTPATIENT_CLINIC_OR_DEPARTMENT_OTHER): Payer: Medicare Other | Attending: Internal Medicine

## 2011-03-03 DIAGNOSIS — G589 Mononeuropathy, unspecified: Secondary | ICD-10-CM | POA: Insufficient documentation

## 2011-03-03 DIAGNOSIS — I739 Peripheral vascular disease, unspecified: Secondary | ICD-10-CM | POA: Insufficient documentation

## 2011-03-03 DIAGNOSIS — L989 Disorder of the skin and subcutaneous tissue, unspecified: Secondary | ICD-10-CM | POA: Insufficient documentation

## 2011-06-22 ENCOUNTER — Other Ambulatory Visit: Payer: Self-pay | Admitting: Family Medicine

## 2011-06-22 DIAGNOSIS — Z1231 Encounter for screening mammogram for malignant neoplasm of breast: Secondary | ICD-10-CM

## 2011-07-04 ENCOUNTER — Ambulatory Visit
Admission: RE | Admit: 2011-07-04 | Discharge: 2011-07-04 | Disposition: A | Discharge status: Home / Self Care | Payer: Medicare Other | Source: Ambulatory Visit | Attending: Family Medicine | Admitting: Family Medicine

## 2011-07-04 DIAGNOSIS — Z1231 Encounter for screening mammogram for malignant neoplasm of breast: Secondary | ICD-10-CM

## 2011-08-17 ENCOUNTER — Other Ambulatory Visit: Payer: Self-pay | Admitting: Neurology

## 2011-08-17 DIAGNOSIS — IMO0002 Reserved for concepts with insufficient information to code with codable children: Secondary | ICD-10-CM

## 2011-08-31 ENCOUNTER — Ambulatory Visit
Admission: RE | Admit: 2011-08-31 | Discharge: 2011-08-31 | Disposition: A | Discharge status: Home / Self Care | Payer: Medicare Other | Source: Ambulatory Visit | Attending: Neurology | Admitting: Neurology

## 2011-08-31 DIAGNOSIS — IMO0002 Reserved for concepts with insufficient information to code with codable children: Secondary | ICD-10-CM

## 2011-09-14 ENCOUNTER — Other Ambulatory Visit: Payer: Self-pay | Admitting: Neurology

## 2011-09-14 DIAGNOSIS — G459 Transient cerebral ischemic attack, unspecified: Secondary | ICD-10-CM

## 2011-09-22 ENCOUNTER — Ambulatory Visit
Admission: RE | Admit: 2011-09-22 | Discharge: 2011-09-22 | Disposition: A | Discharge status: Home / Self Care | Payer: Medicare Other | Source: Ambulatory Visit | Attending: Neurology | Admitting: Neurology

## 2011-09-22 DIAGNOSIS — G459 Transient cerebral ischemic attack, unspecified: Secondary | ICD-10-CM

## 2011-09-28 ENCOUNTER — Encounter: Payer: Self-pay | Admitting: *Deleted

## 2011-09-28 ENCOUNTER — Emergency Department (HOSPITAL_COMMUNITY)
Admission: EM | Admit: 2011-09-28 | Discharge: 2011-09-28 | Disposition: A | Discharge status: Home / Self Care | Payer: Medicare Other | Attending: Emergency Medicine | Admitting: Emergency Medicine

## 2011-09-28 DIAGNOSIS — K921 Melena: Secondary | ICD-10-CM | POA: Insufficient documentation

## 2011-09-28 DIAGNOSIS — I10 Essential (primary) hypertension: Secondary | ICD-10-CM | POA: Insufficient documentation

## 2011-09-28 DIAGNOSIS — K625 Hemorrhage of anus and rectum: Secondary | ICD-10-CM | POA: Insufficient documentation

## 2011-09-28 DIAGNOSIS — E039 Hypothyroidism, unspecified: Secondary | ICD-10-CM | POA: Insufficient documentation

## 2011-09-28 DIAGNOSIS — Z8673 Personal history of transient ischemic attack (TIA), and cerebral infarction without residual deficits: Secondary | ICD-10-CM | POA: Insufficient documentation

## 2011-09-28 DIAGNOSIS — Z85038 Personal history of other malignant neoplasm of large intestine: Secondary | ICD-10-CM | POA: Insufficient documentation

## 2011-09-28 DIAGNOSIS — Z79899 Other long term (current) drug therapy: Secondary | ICD-10-CM | POA: Insufficient documentation

## 2011-09-28 HISTORY — DX: Transient cerebral ischemic attack, unspecified: G45.9

## 2011-09-28 HISTORY — DX: Other giant cell arteritis: M31.6

## 2011-09-28 HISTORY — DX: Essential (primary) hypertension: I10

## 2011-09-28 HISTORY — DX: Hypothyroidism, unspecified: E03.9

## 2011-09-28 HISTORY — DX: Personal history of other diseases of the digestive system: Z87.19

## 2011-09-28 HISTORY — DX: Elevated erythrocyte sedimentation rate: R70.0

## 2011-09-28 HISTORY — DX: Polyneuropathy, unspecified: G62.9

## 2011-09-28 HISTORY — DX: Other specified postprocedural states: Z98.890

## 2011-09-28 LAB — CBC
HCT: 39.9 % (ref 36.0–46.0)
Hemoglobin: 13 g/dL (ref 12.0–15.0)
MCH: 32 pg (ref 26.0–34.0)
MCHC: 32.6 g/dL (ref 30.0–36.0)
MCV: 98.3 fL (ref 78.0–100.0)
RDW: 14.2 % (ref 11.5–15.5)

## 2011-09-28 LAB — DIFFERENTIAL
Basophils Absolute: 0 10*3/uL (ref 0.0–0.1)
Basophils Relative: 0 % (ref 0–1)
Eosinophils Absolute: 0 10*3/uL (ref 0.0–0.7)
Eosinophils Relative: 0 % (ref 0–5)
Monocytes Absolute: 0.2 10*3/uL (ref 0.1–1.0)
Monocytes Relative: 2 % — ABNORMAL LOW (ref 3–12)
Neutro Abs: 10.9 10*3/uL — ABNORMAL HIGH (ref 1.7–7.7)

## 2011-09-28 LAB — COMPREHENSIVE METABOLIC PANEL
AST: 15 U/L (ref 0–37)
Albumin: 3.1 g/dL — ABNORMAL LOW (ref 3.5–5.2)
BUN: 39 mg/dL — ABNORMAL HIGH (ref 6–23)
Calcium: 9.4 mg/dL (ref 8.4–10.5)
Chloride: 99 mEq/L (ref 96–112)
Creatinine, Ser: 1.17 mg/dL — ABNORMAL HIGH (ref 0.50–1.10)
Total Bilirubin: 0.4 mg/dL (ref 0.3–1.2)

## 2011-09-28 LAB — OCCULT BLOOD, POC DEVICE: Fecal Occult Bld: POSITIVE

## 2011-09-28 NOTE — ED Provider Notes (Signed)
History     CSN: [id number]  Arrival date & time 09/28/11  1228   First MD Initiated Contact with Patient 09/28/11 1321      Chief Complaint  Patient presents with  . Rectal Bleeding    (Consider location/radiation/quality/duration/timing/severity/associated sxs/prior treatment) HPI Comments: Patient went to bathroom this morning and blood was in the toilet.  No pain.  No fevers.  Never happened before.  On aggrenox for tias.  Patient is a 78 y.o. female presenting with hematochezia. The history is provided by the patient.  Rectal Bleeding  The current episode started today. The problem has been resolved. The patient is experiencing no pain. The stool is described as soft. Pertinent negatives include no fever, no hemorrhoids, no nausea and no hematuria.    Past Medical History  Diagnosis Date  . Peripheral neuropathy   . Hypertension   . TIA (transient ischemic attack)   . Elevated sed rate   . Temporal arteritis   . Hypothyroidism   . Cancer     colon  . History of hernia repair   . S/P rotator cuff surgery     right    Past Surgical History  Procedure Date  . Tonsillectomy   . Appendectomy   . Abdominal hysterectomy   . Colectomy     No family history on file.  History  Substance Use Topics  . Smoking status: Never Smoker   . Smokeless tobacco: Not on file  . Alcohol Use: No    OB History    Grav Para Term Preterm Abortions TAB SAB Ect Mult Living                  Review of Systems  Constitutional: Negative for fever.  Gastrointestinal: Positive for hematochezia. Negative for nausea and hemorrhoids.  Genitourinary: Negative for hematuria.  All other systems reviewed and are negative.    Allergies  Ciprofloxacin; Codeine; Demerol; Lyrica; Morphine and related; Nitrofuran derivatives; Septra; and Zyrtec  Home Medications   Current Outpatient Rx  Name Route Sig Dispense Refill  . ACETAMINOPHEN 325 MG PO TABS Oral Take 650 mg by mouth every 6  (six) hours as needed. pain    . CALCIUM CARBONATE-VITAMIN D 250-125 MG-UNIT PO TABS Oral Take 1 tablet by mouth daily.      . ASPIRIN-DIPYRIDAMOLE 25-200 MG PO CP12 Oral Take 1 capsule by mouth 2 (two) times daily.      . DONEPEZIL HCL 10 MG PO TABS Oral Take 10 mg by mouth at bedtime as needed.      Marland Kitchen FERROUS SULFATE 325 (65 FE) MG PO TABS Oral Take 325 mg by mouth daily with breakfast.      . LEVOTHYROXINE SODIUM 150 MCG PO TABS Oral Take 150 mcg by mouth daily.      . ADULT MULTIVITAMIN W/MINERALS CH Oral Take 1 tablet by mouth daily.      Marland Kitchen OMEPRAZOLE 20 MG PO CPDR Oral Take 20 mg by mouth daily.      . SERTRALINE HCL 50 MG PO TABS Oral Take 50 mg by mouth daily.      Marland Kitchen SIMVASTATIN 20 MG PO TABS Oral Take 20 mg by mouth at bedtime.      . SULFAMETHOXAZOLE-TRIMETHOPRIM 400-80 MG PO TABS Oral Take 1 tablet by mouth 2 (two) times daily. Continuous therapy to prevent UTI     . DOCUSATE SODIUM 100 MG PO CAPS Oral Take 100 mg by mouth 2 (two) times daily.      Marland Kitchen  POLYETHYLENE GLYCOL 3350 PO PACK Oral Take 17 g by mouth daily.        BP 140/60  Pulse 64  Temp(Src) 97.7 F (36.5 C) (Oral)  Resp 16  Wt 200 lb (90.719 kg)  SpO2 100%  Physical Exam  Nursing note and vitals reviewed. Constitutional: She is oriented to person, place, and time. She appears well-developed and well-nourished. No distress.  HENT:  Head: Normocephalic and atraumatic.  Neck: Normal range of motion. Neck supple.  Cardiovascular: Normal rate and regular rhythm.  Exam reveals no gallop and no friction rub.   No murmur heard. Pulmonary/Chest: Effort normal and breath sounds normal. No respiratory distress. She has no wheezes.  Abdominal: Soft. Bowel sounds are normal. She exhibits no distension. There is no tenderness.  Genitourinary:       Brown stool in the rectum with slight streaks of blood.  Musculoskeletal: Normal range of motion.  Neurological: She is alert and oriented to person, place, and time.  Skin:  Skin is warm and dry. She is not diaphoretic.    ED Course  Procedures (including critical care time)   Labs Reviewed  CBC  DIFFERENTIAL  COMPREHENSIVE METABOLIC PANEL  POCT OCCULT BLOOD STOOL, DEVICE   No results found.   No diagnosis found.    MDM  Patient presents after an episode of painless bloody stool.  Her Hb was 13 and is not actively bleeding in the ER.  There is no fever or wbc.  She has seen Dr. Randa Evens in the past and wants to go home.  She will call him in the morning to arrange follow up, ideally within the next few days.  She is to return if her symptoms worsen or she develops shortness of breath or dizziness.          Geoffery Lyons, MD 09/28/11 1535

## 2011-09-28 NOTE — ED Notes (Addendum)
Pt reports that she sat on toilet to have BM this AM and when she got up and looked, the toilet was full of bright red blood with some clots; states "It looked like the red sea."; hx of colon cancer 1987; denies pain; denies n/v/d; normoactive bowel sounds; abd soft, suprapubic tenderness

## 2011-09-28 NOTE — ED Notes (Signed)
Pt states "had a BM and it looked like the red sea"

## 2012-03-08 ENCOUNTER — Emergency Department (HOSPITAL_COMMUNITY): Payer: Medicare Other

## 2012-03-08 ENCOUNTER — Inpatient Hospital Stay (HOSPITAL_COMMUNITY)
Admission: EM | Admit: 2012-03-08 | Discharge: 2012-03-14 | DRG: 690 | Disposition: A | Discharge status: Skilled Nursing Facility | Payer: Medicare Other | Attending: Internal Medicine | Admitting: Internal Medicine

## 2012-03-08 DIAGNOSIS — R5381 Other malaise: Secondary | ICD-10-CM | POA: Diagnosis present

## 2012-03-08 DIAGNOSIS — Z8673 Personal history of transient ischemic attack (TIA), and cerebral infarction without residual deficits: Secondary | ICD-10-CM

## 2012-03-08 DIAGNOSIS — N3020 Other chronic cystitis without hematuria: Secondary | ICD-10-CM

## 2012-03-08 DIAGNOSIS — F0390 Unspecified dementia without behavioral disturbance: Secondary | ICD-10-CM

## 2012-03-08 DIAGNOSIS — R4182 Altered mental status, unspecified: Secondary | ICD-10-CM | POA: Diagnosis present

## 2012-03-08 DIAGNOSIS — N390 Urinary tract infection, site not specified: Principal | ICD-10-CM | POA: Diagnosis present

## 2012-03-08 DIAGNOSIS — F32A Depression, unspecified: Secondary | ICD-10-CM | POA: Diagnosis present

## 2012-03-08 DIAGNOSIS — Z792 Long term (current) use of antibiotics: Secondary | ICD-10-CM

## 2012-03-08 DIAGNOSIS — R7989 Other specified abnormal findings of blood chemistry: Secondary | ICD-10-CM

## 2012-03-08 DIAGNOSIS — Z85038 Personal history of other malignant neoplasm of large intestine: Secondary | ICD-10-CM

## 2012-03-08 DIAGNOSIS — E876 Hypokalemia: Secondary | ICD-10-CM | POA: Diagnosis present

## 2012-03-08 DIAGNOSIS — F329 Major depressive disorder, single episode, unspecified: Secondary | ICD-10-CM | POA: Diagnosis present

## 2012-03-08 DIAGNOSIS — F3289 Other specified depressive episodes: Secondary | ICD-10-CM | POA: Diagnosis present

## 2012-03-08 DIAGNOSIS — E039 Hypothyroidism, unspecified: Secondary | ICD-10-CM | POA: Diagnosis present

## 2012-03-08 DIAGNOSIS — N189 Chronic kidney disease, unspecified: Secondary | ICD-10-CM | POA: Diagnosis present

## 2012-03-08 DIAGNOSIS — F05 Delirium due to known physiological condition: Secondary | ICD-10-CM | POA: Diagnosis present

## 2012-03-08 DIAGNOSIS — I129 Hypertensive chronic kidney disease with stage 1 through stage 4 chronic kidney disease, or unspecified chronic kidney disease: Secondary | ICD-10-CM | POA: Diagnosis present

## 2012-03-08 DIAGNOSIS — E785 Hyperlipidemia, unspecified: Secondary | ICD-10-CM | POA: Diagnosis present

## 2012-03-08 DIAGNOSIS — R531 Weakness: Secondary | ICD-10-CM | POA: Diagnosis present

## 2012-03-08 LAB — CARDIAC PANEL(CRET KIN+CKTOT+MB+TROPI)
Relative Index: 4.2 — ABNORMAL HIGH (ref 0.0–2.5)
Troponin I: <0.3 ng/mL (ref <0.30)

## 2012-03-08 LAB — DIFFERENTIAL
Eosinophils Absolute: 0 10*3/uL (ref 0.0–0.7)
Lymphocytes Relative: 22 % (ref 12–46)
Lymphs Abs: 1.7 10*3/uL (ref 0.7–4.0)
Neutro Abs: 5.6 10*3/uL (ref 1.7–7.7)
Neutrophils Relative %: 71 % (ref 43–77)

## 2012-03-08 LAB — CBC
MCH: 32.7 pg (ref 26.0–34.0)
Platelets: 309 10*3/uL (ref 150–400)
RBC: 4.31 MIL/uL (ref 3.87–5.11)
WBC: 7.9 10*3/uL (ref 4.0–10.5)

## 2012-03-08 LAB — COMPREHENSIVE METABOLIC PANEL
ALT: 20 U/L (ref 0–35)
Alkaline Phosphatase: 68 U/L (ref 39–117)
GFR calc Af Amer: 42 mL/min — ABNORMAL LOW (ref >90)
Glucose, Bld: 90 mg/dL (ref 70–99)
Potassium: 3.8 mEq/L (ref 3.5–5.1)
Sodium: 135 mEq/L (ref 135–145)
Total Protein: 7.2 g/dL (ref 6.0–8.3)

## 2012-03-08 LAB — URINALYSIS, ROUTINE W REFLEX MICROSCOPIC
Bilirubin Urine: NEGATIVE
Glucose, UA: NEGATIVE mg/dL
Ketones, ur: NEGATIVE mg/dL
pH: 5.5 (ref 5.0–8.0)

## 2012-03-08 MED ORDER — ONDANSETRON HCL 4 MG/2ML IJ SOLN
4.0000 mg | Freq: Four times a day (QID) | INTRAMUSCULAR | Status: DC | PRN
  Administered 2012-03-09 – 2012-03-13 (×2): 4 mg via INTRAVENOUS
  Filled 2012-03-08 (×2): qty 2

## 2012-03-08 MED ORDER — ONDANSETRON HCL 4 MG PO TABS: 4.0000 mg | ORAL_TABLET | Freq: Four times a day (QID) | ORAL | Status: DC | PRN

## 2012-03-08 MED ORDER — PREDNISONE 10 MG PO TABS
10.0000 mg | ORAL_TABLET | ORAL | Status: DC
  Administered 2012-03-09 – 2012-03-13 (×3): 10 mg via ORAL
  Filled 2012-03-08 (×3): qty 1

## 2012-03-08 MED ORDER — ASPIRIN-DIPYRIDAMOLE ER 25-200 MG PO CP12
1.0000 | ORAL_CAPSULE | Freq: Two times a day (BID) | ORAL | Status: DC
  Administered 2012-03-08 – 2012-03-14 (×12): 1 via ORAL
  Filled 2012-03-08 (×14): qty 1

## 2012-03-08 MED ORDER — SIMVASTATIN 20 MG PO TABS
20.0000 mg | ORAL_TABLET | Freq: Every day | ORAL | Status: DC
  Administered 2012-03-09 – 2012-03-13 (×5): 20 mg via ORAL
  Filled 2012-03-08 (×6): qty 1

## 2012-03-08 MED ORDER — SODIUM CHLORIDE 0.9 % IJ SOLN
3.0000 mL | Freq: Two times a day (BID) | INTRAMUSCULAR | Status: DC
  Administered 2012-03-08 – 2012-03-13 (×8): 3 mL via INTRAVENOUS

## 2012-03-08 MED ORDER — PANTOPRAZOLE SODIUM 40 MG PO TBEC
40.0000 mg | DELAYED_RELEASE_TABLET | Freq: Every day | ORAL | Status: DC
  Administered 2012-03-09 – 2012-03-13 (×6): 40 mg via ORAL
  Filled 2012-03-08 (×7): qty 1

## 2012-03-08 MED ORDER — DONEPEZIL HCL 10 MG PO TABS
10.0000 mg | ORAL_TABLET | Freq: Every day | ORAL | Status: DC
  Administered 2012-03-09 – 2012-03-13 (×5): 10 mg via ORAL
  Filled 2012-03-08 (×6): qty 1

## 2012-03-08 MED ORDER — LEVOTHYROXINE SODIUM 150 MCG PO TABS
150.0000 ug | ORAL_TABLET | Freq: Every day | ORAL | Status: DC
  Administered 2012-03-09 – 2012-03-14 (×6): 150 ug via ORAL
  Filled 2012-03-08 (×6): qty 1

## 2012-03-08 MED ORDER — ACETAMINOPHEN 325 MG PO TABS
650.0000 mg | ORAL_TABLET | Freq: Four times a day (QID) | ORAL | Status: DC | PRN
  Administered 2012-03-13: 650 mg via ORAL
  Filled 2012-03-08: qty 2

## 2012-03-08 MED ORDER — SERTRALINE HCL 50 MG PO TABS
50.0000 mg | ORAL_TABLET | Freq: Every day | ORAL | Status: DC
  Administered 2012-03-09 – 2012-03-14 (×6): 50 mg via ORAL
  Filled 2012-03-08 (×6): qty 1

## 2012-03-08 MED ORDER — DEXTROSE 5 % IV SOLN
1.0000 g | Freq: Every day | INTRAVENOUS | Status: DC
  Administered 2012-03-09 – 2012-03-12 (×5): 1 g via INTRAVENOUS
  Filled 2012-03-08 (×5): qty 10

## 2012-03-08 MED ORDER — SODIUM CHLORIDE 0.9 % IV SOLN
INTRAVENOUS | Status: DC
  Administered 2012-03-08 – 2012-03-11 (×4): via INTRAVENOUS

## 2012-03-08 MED ORDER — ACETAMINOPHEN 650 MG RE SUPP: 650.0000 mg | Freq: Four times a day (QID) | RECTAL | Status: DC | PRN

## 2012-03-08 MED ORDER — PREDNISONE 5 MG PO TABS
15.0000 mg | ORAL_TABLET | ORAL | Status: DC
  Administered 2012-03-10 – 2012-03-14 (×3): 15 mg via ORAL
  Filled 2012-03-08 (×3): qty 1

## 2012-03-08 NOTE — ED Notes (Signed)
Pt presented to ED office , daughter states she has been confused x1 week..I sent the Rx to the pharmacy. To ED for Eval.

## 2012-03-08 NOTE — ED Notes (Signed)
Pt shivering in bed with family at bedside. Check temp: 98.0 orally. Nurse notified; instructed to give warm blanket.

## 2012-03-08 NOTE — H&P (Signed)
PCP:   Cain Saupe, MD   Chief Complaint:  Generalized weakness, confusion  HPI: Patient is an 78 y/o CF with history of dementia, TIA's, temporal arteritis, and chronic UTI's which her urologist has had her on prophylactic antibiotics for ~ 1 year.  Presented to the ED because her daughter (who she resides with) noted her to be getting more weak lately as well as confused.  Family reports that she was unable to state her name or identify her daughter which she is usually able to do.  History is obtained from family mainly although patient is able to answer questions and interacts with examiner.  Denies any new focal weakness or neurological deficits like difficulty with speak.  They also report that she has had decreased oral intake within the last week.  She was seen by her urologist who within the last 5 days or so took her off of her prophylactic antibiotic and started her on a different antibiotic for a suspected UTI.  Patient continued to have "confusion" per family and was brought into the ED for further evaluation.  While at the ED patient has had lab work which showed an elevated creatinine level of 1.3 although her last one in 09/28/11 was 1.17.  BMP which was essentially normal otherwise.  She had an elevated CK MB of 5.5 although her total CK was 131 and her troponin was WNL at <0.30.  EKG was unchanged when compared to last in 01/25/11.  CT of the head was obtained which was negative for any acute intracranial findings with unchanged ventricular dilatation. Chest x ray was negative for active disease.  Patient denies any recent or current chest pain, nausea, diaphoresis, or new focal neurological deficits.  Allergies:   Allergies  Allergen Reactions  . Ciprofloxacin Nausea And Vomiting  . Codeine Other (See Comments)    halucinations  . Demerol Other (See Comments)    halucinations  . Morphine And Related Other (See Comments)    halucinations  . Nitrofuran Derivatives Other (See  Comments)    unknown  . Pregabalin Other (See Comments)    unknown  . Septra (Bactrim) Other (See Comments)    Unknown cant tolerate DS  . Zyrtec (Cetirizine Hcl) Other (See Comments)    Makes loopy      Past Medical History  Diagnosis Date  . Peripheral neuropathy   . Hypertension   . TIA (transient ischemic attack)   . Elevated sed rate   . Temporal arteritis   . Hypothyroidism   . Cancer     colon  . History of hernia repair   . S/P rotator cuff surgery     right    Past Surgical History  Procedure Date  . Tonsillectomy   . Appendectomy   . Abdominal hysterectomy   . Colectomy     Prior to Admission medications   Medication Sig Start Date End Date Taking? Authorizing Provider  acetaminophen (TYLENOL) 325 MG tablet Take 650 mg by mouth every 6 (six) hours as needed. pain   Yes Historical Provider, MD  Calcium Carbonate-Vitamin D (CALCIUM + D PO) Take 2 tablets by mouth 2 (two) times daily.   Yes Historical Provider, MD  cephALEXin (KEFLEX) 500 MG capsule Take 500 mg by mouth 3 (three) times daily. 5 day course of therapy; started 03/04/12   Yes Historical Provider, MD  cholecalciferol (VITAMIN D) 1000 UNITS tablet Take 2,000 Units by mouth daily.   Yes Historical Provider, MD  CRANBERRY EXTRACT PO  Take 1 capsule by mouth daily.   Yes Historical Provider, MD  dipyridamole-aspirin (AGGRENOX) 25-200 MG per 12 hr capsule Take 1 capsule by mouth 2 (two) times daily.     Yes Historical Provider, MD  docusate sodium (COLACE) 100 MG capsule Take 200 mg by mouth daily.    Yes Historical Provider, MD  donepezil (ARICEPT) 10 MG tablet Take 10 mg by mouth at bedtime.    Yes Historical Provider, MD  ferrous sulfate 325 (65 FE) MG tablet Take 325 mg by mouth daily.    Yes Historical Provider, MD  levothyroxine (SYNTHROID, LEVOTHROID) 150 MCG tablet Take 150 mcg by mouth daily.     Yes Historical Provider, MD  Multiple Vitamin (MULITIVITAMIN WITH MINERALS) TABS Take 1 tablet by mouth  daily.     Yes Historical Provider, MD  omeprazole (PRILOSEC) 20 MG capsule Take 20 mg by mouth daily.     Yes Historical Provider, MD  polyethylene glycol (MIRALAX / GLYCOLAX) packet Take 17 g by mouth daily as needed. For constipation.   Yes Historical Provider, MD  predniSONE (DELTASONE) 5 MG tablet Take 10-15 mg by mouth See admin instructions. Alternating daily between 10mg  and 15mg    Yes Historical Provider, MD  sertraline (ZOLOFT) 50 MG tablet Take 50 mg by mouth daily.     Yes Historical Provider, MD  simvastatin (ZOCOR) 20 MG tablet Take 20 mg by mouth at bedtime.     Yes Historical Provider, MD  sulfamethoxazole-trimethoprim (BACTRIM,SEPTRA) 400-80 MG per tablet Take 1 tablet by mouth at bedtime. Continuous therapy to prevent UTI   Yes Historical Provider, MD    Social History:  reports that she has never smoked. She does not have any smokeless tobacco history on file. She reports that she does not drink alcohol or use illicit drugs.  No family history on file.  Review of Systems:  Unable to properly assess due to altered mental status and limited cooperation by patient.  ROS is mainly positive for the things mentioned above and is according to daughter although patient directly was asked about chest pain to which she denied any currently.   Physical Exam: Blood pressure 189/83, pulse 72, temperature 98.1 F (36.7 C), temperature source Oral, resp. rate 16, SpO2 96.00%. General: Alert, awake, oriented x1 (person but not place or time), in no acute distress. HEENT: No bruits, no goiter. Heart: Regular rate and rhythm, without murmurs, rubs, gallops. Lungs: Clear to auscultation bilaterally. Abdomen: Soft, nontender, nondistended, positive bowel sounds, + suprapubic tenderness Extremities: Compression stockings in place, no calf tenderness. Neuro: Patient responded to questions appropriately, was able to move all extremities and had 5/5 strength in upper and lower  extremities.   Labs on Admission:  Results for orders placed during the hospital encounter of 03/08/12 (from the past 48 hour(s))  CBC     Status: Normal   Collection Time   03/08/12  4:10 PM      Component Value Range Comment   WBC 7.9  4.0 - 10.5 K/uL    RBC 4.31  3.87 - 5.11 MIL/uL    Hemoglobin 14.1  12.0 - 15.0 g/dL    HCT 16.1  09.6 - 04.5 %    MCV 97.7  78.0 - 100.0 fL    MCH 32.7  26.0 - 34.0 pg    MCHC 33.5  30.0 - 36.0 g/dL    RDW 40.9  81.1 - 91.4 %    Platelets 309  150 - 400 K/uL  DIFFERENTIAL     Status: Normal   Collection Time   03/08/12  4:10 PM      Component Value Range Comment   Neutrophils Relative 71  43 - 77 %    Neutro Abs 5.6  1.7 - 7.7 K/uL    Lymphocytes Relative 22  12 - 46 %    Lymphs Abs 1.7  0.7 - 4.0 K/uL    Monocytes Relative 7  3 - 12 %    Monocytes Absolute 0.5  0.1 - 1.0 K/uL    Eosinophils Relative 1  0 - 5 %    Eosinophils Absolute 0.0  0.0 - 0.7 K/uL    Basophils Relative 0  0 - 1 %    Basophils Absolute 0.0  0.0 - 0.1 K/uL   COMPREHENSIVE METABOLIC PANEL     Status: Abnormal   Collection Time   03/08/12  4:10 PM      Component Value Range Comment   Sodium 135  135 - 145 mEq/L    Potassium 3.8  3.5 - 5.1 mEq/L    Chloride 92 (*) 96 - 112 mEq/L    CO2 26  19 - 32 mEq/L    Glucose, Bld 90  70 - 99 mg/dL    BUN 18  6 - 23 mg/dL    Creatinine, Ser 0.45 (*) 0.50 - 1.10 mg/dL    Calcium 40.9  8.4 - 10.5 mg/dL    Total Protein 7.2  6.0 - 8.3 g/dL    Albumin 3.7  3.5 - 5.2 g/dL    AST 29  0 - 37 U/L    ALT 20  0 - 35 U/L    Alkaline Phosphatase 68  39 - 117 U/L    Total Bilirubin 0.5  0.3 - 1.2 mg/dL    GFR calc non Af Amer 36 (*) >90 mL/min    GFR calc Af Amer 42 (*) >90 mL/min   LACTIC ACID, PLASMA     Status: Normal   Collection Time   03/08/12  4:10 PM      Component Value Range Comment   Lactic Acid, Venous 1.2  0.5 - 2.2 mmol/L   CARDIAC PANEL(CRET KIN+CKTOT+MB+TROPI)     Status: Abnormal   Collection Time   03/08/12  4:10 PM       Component Value Range Comment   Total CK 131  7 - 177 U/L    CK, MB 5.5 (*) 0.3 - 4.0 ng/mL    Troponin I <0.30  <0.30 ng/mL    Relative Index 4.2 (*) 0.0 - 2.5   URINALYSIS, ROUTINE W REFLEX MICROSCOPIC     Status: Abnormal   Collection Time   03/08/12  4:30 PM      Component Value Range Comment   Color, Urine YELLOW  YELLOW    APPearance CLEAR  CLEAR    Specific Gravity, Urine 1.021  1.005 - 1.030    pH 5.5  5.0 - 8.0    Glucose, UA NEGATIVE  NEGATIVE mg/dL    Hgb urine dipstick SMALL (*) NEGATIVE    Bilirubin Urine NEGATIVE  NEGATIVE    Ketones, ur NEGATIVE  NEGATIVE mg/dL    Protein, ur NEGATIVE  NEGATIVE mg/dL    Urobilinogen, UA 0.2  0.0 - 1.0 mg/dL    Nitrite NEGATIVE  NEGATIVE    Leukocytes, UA NEGATIVE  NEGATIVE   URINE MICROSCOPIC-ADD ON     Status: Normal   Collection Time   03/08/12  4:30  PM      Component Value Range Comment   RBC / HPF 0-2  <3 RBC/hpf    Urine-Other MUCOUS PRESENT       Radiological Exams on Admission: Ct Head Wo Contrast  03/08/2012  *RADIOLOGY REPORT*  Clinical Data: Confusion, altered mental status  CT HEAD WITHOUT CONTRAST  Technique:  Contiguous axial images were obtained from the base of the skull through the vertex without contrast.  Comparison: Brain MRI 09/22/2011, head CT 06/18/2010  Findings: No acute intracranial hemorrhage.  No focal mass lesion. No CT evidence of acute infarction.   No midline shift or mass effect.  No hydrocephalus.  Basilar cisterns are patent. Ventricles are large unchanged from prior.  There is extensive white matter hypodensities within the periventricular white matter.  Findings are unchanged from multiple comparison exams.  There is generalized cortical atrophy.  No CT evidence of acute cortical infarction.  Paranasal sinuses and mastoid air cells are clear.  Orbits are normal.  IMPRESSION:  1.  No acute intracranial findings. 2.  No change from prior. 3.  Ventricular dilatation and periventricular white matter  hypodensities are unchanged from prior.  Original Report Authenticated By: Genevive Bi, M.D.   Dg Chest Port 1 View  03/08/2012  *RADIOLOGY REPORT*  Clinical Data: Altered mental status  PORTABLE CHEST - 1 VIEW  Comparison: 12/03/2010  Findings: Cardiomegaly again noted.  Stable chronic bronchitic changes.  No acute infiltrate or pulmonary edema.  Stable chronic blunting of the left costophrenic angle.  Atherosclerotic calcifications of thoracic aorta.  Stable degenerative changes thoracic spine.  IMPRESSION: No active disease.  Stable chronic bronchitic changes. Are noted chronic blunting of the left costophrenic angle.  Original Report Authenticated By: Natasha Mead, M.D.    Assessment/Plan Active Problems: 1) Altered Mental Status:  -Etiology uncertain currently, patient has h/o chronic UTI's and with active infections develops this frequently per family.  U/A was negative although patient has been on cephalosporin recently. -Order labs for reversible causes: TSH, Vitamin B12/folate, ammonia level, rpr - Will reassess status in AM - CT of head was negative.  2) Chronic UTI's - May be causing # 1 at this point will go ahead and place patient on rocephin. - Last urine culture patient grew out proteus mirabilis - Order urine culture  3) Elevated CKMB - At this point patient is asymptomatic with unchanged EKG.  Will go ahead and cycle cardiac enzymes. - Place on telemetry - Aspirin if not administered in ED - Pt does have elevated creatinine and troponin negative will continue to monitor.  Should her cardiac enzymes trend higher would consult cardiology for further evaluation/recommendations.  4) Generalized weakness: -Likely due to # 1 and 2 as well as history of recent poor oral intake.  Will go ahead and place Physical therapy consult.  5) Hypothyroidism: - Continue home regimen. - recheck TSH  6) HPL: - Continue zocor  7) Depression:  -Continue Zoloft  8) Dementia:   -Pt is  on aricept at home.  Should work up come back negative would consider worsening dementia as cause.  9) Current prednisone use: Reportedly patient has been diagnosed with temporal arteritis and was started on prednisone in the past.  In January daughter reports that patient's sed rate went up so patient was placed on steroids and is being weaned off by her neurologist. - Continue prednisone at this juncture.  10) h/o TIA:  Patient is currently on aggrenox at home.  CT was negative.  Patient with  no new focal neurological deficits and based on history confusion seems to be related to UTI's which she seems to get frequently.  Continue aggrenox for now.  Disposition: Pending results and clinical improvement.  Also will cycle cardiac enzymes.    Time Spent on Admission: 60 minutes reviewing history on chart, documenting, placing orders, medical decision making.  Penny Pia [facility] Pager: [phone] 03/08/2012, 7:59 PM

## 2012-03-08 NOTE — ED Notes (Signed)
Family at bedside. 

## 2012-03-08 NOTE — Progress Notes (Signed)
ANTIBIOTIC CONSULT NOTE - INITIAL  Pharmacy Consult for rocephin Indication: UTI  Allergies  Allergen Reactions  . Ciprofloxacin Nausea And Vomiting  . Codeine Other (See Comments)    halucinations  . Demerol Other (See Comments)    halucinations  . Morphine And Related Other (See Comments)    halucinations  . Nitrofuran Derivatives Other (See Comments)    unknown  . Pregabalin Other (See Comments)    unknown  . Septra (Bactrim) Other (See Comments)    Unknown cant tolerate DS  . Zyrtec (Cetirizine Hcl) Other (See Comments)    Makes loopy    Patient Measurements:   Adjusted Body Weight:   Vital Signs: Temp: 98.1 F (36.7 C) (06/13 2012) Temp src: Oral (06/13 2012) BP: 177/79 mmHg (06/13 2012) Pulse Rate: 86  (06/13 2012) Intake/Output from previous day:   Intake/Output from this shift: Total I/O In: 120 [P.O.:120] Out: -   Labs:  Basename 03/08/12 1610  WBC 7.9  HGB 14.1  PLT 309  LABCREA --  CREATININE 1.34*   CrCl is unknown because there is no height on file for the current visit. No results found for this basename: VANCOTROUGH:2,VANCOPEAK:2,VANCORANDOM:2,GENTTROUGH:2,GENTPEAK:2,GENTRANDOM:2,TOBRATROUGH:2,TOBRAPEAK:2,TOBRARND:2,AMIKACINPEAK:2,AMIKACINTROU:2,AMIKACIN:2, in the last 72 hours   Microbiology: No results found for this or any previous visit (from the past 720 hour(s)).  Medical History: Past Medical History  Diagnosis Date  . Peripheral neuropathy   . Hypertension   . TIA (transient ischemic attack)   . Elevated sed rate   . Temporal arteritis   . Hypothyroidism   . Cancer     colon  . History of hernia repair   . S/P rotator cuff surgery     right    Medications:  Anti-infectives     Start     Dose/Rate Route Frequency Ordered Stop   03/08/12 2315   cefTRIAXone (ROCEPHIN) 1 g in dextrose 5 % 50 mL IVPB        1 g 100 mL/hr over 30 Minutes Intravenous Daily at bedtime 03/08/12 2307           Assessment: Patient with MD  wanting pharmacy to dose ceftriaxone for UTI  Goal of Therapy:  Rocephin dosing for UTI.    Plan:  Follow up culture results Rocephin 1gm iv q24hr  Aleene Davidson Crowford 03/08/2012,11:07 PM

## 2012-03-08 NOTE — ED Provider Notes (Signed)
History     CSN: [id number]  Arrival date & time 03/08/12  1512   First MD Initiated Contact with Patient 03/08/12 1525      Chief Complaint  Patient presents with  . Altered Mental Status    (Consider location/radiation/quality/duration/timing/severity/associated sxs/prior treatment) HPI The patient p/w concerns of AMS.  HPI is per her daughter, 2/2 dementia.  The patient herself offers that she was "confused" previously, but now feels back to baseline, with no pain or focal complaints.  Per the daughter, the patient has recently been treated for intermittent UTI, and since initiating new therapy (unknown ABX) four days ago she was generally improving.  However, over the past 24hr the patient has become more confused than normal, had more atypical behavior, had a fall from bed, and an unusual episode of incontinence. The daughter denies any fever, recent emesis, agitation, or diarrhea. Past Medical History  Diagnosis Date  . Peripheral neuropathy   . Hypertension   . TIA (transient ischemic attack)   . Elevated sed rate   . Temporal arteritis   . Hypothyroidism   . Cancer     colon  . History of hernia repair   . S/P rotator cuff surgery     right    Past Surgical History  Procedure Date  . Tonsillectomy   . Appendectomy   . Abdominal hysterectomy   . Colectomy     No family history on file.  History  Substance Use Topics  . Smoking status: Never Smoker   . Smokeless tobacco: Not on file  . Alcohol Use: No    OB History    Grav Para Term Preterm Abortions TAB SAB Ect Mult Living                  Review of Systems  Unable to perform ROS: Dementia    Allergies  Ciprofloxacin; Codeine; Demerol; Morphine and related; Nitrofuran derivatives; Pregabalin; Septra; and Zyrtec  Home Medications   Current Outpatient Rx  Name Route Sig Dispense Refill  . ACETAMINOPHEN 325 MG PO TABS Oral Take 650 mg by mouth every 6 (six) hours as needed. pain    . CALCIUM  CARBONATE-VITAMIN D 250-125 MG-UNIT PO TABS Oral Take 1 tablet by mouth daily.      . ASPIRIN-DIPYRIDAMOLE ER 25-200 MG PO CP12 Oral Take 1 capsule by mouth 2 (two) times daily.      Marland Kitchen DOCUSATE SODIUM 100 MG PO CAPS Oral Take 100 mg by mouth 2 (two) times daily.      . DONEPEZIL HCL 10 MG PO TABS Oral Take 10 mg by mouth at bedtime as needed.      Marland Kitchen FERROUS SULFATE 325 (65 FE) MG PO TABS Oral Take 325 mg by mouth daily with breakfast.      . LEVOTHYROXINE SODIUM 150 MCG PO TABS Oral Take 150 mcg by mouth daily.      . ADULT MULTIVITAMIN W/MINERALS CH Oral Take 1 tablet by mouth daily.      Marland Kitchen OMEPRAZOLE 20 MG PO CPDR Oral Take 20 mg by mouth daily.      Marland Kitchen POLYETHYLENE GLYCOL 3350 PO PACK Oral Take 17 g by mouth daily.      . SERTRALINE HCL 50 MG PO TABS Oral Take 50 mg by mouth daily.      Marland Kitchen SIMVASTATIN 20 MG PO TABS Oral Take 20 mg by mouth at bedtime.      . SULFAMETHOXAZOLE-TRIMETHOPRIM 400-80 MG PO TABS Oral Take 1  tablet by mouth 2 (two) times daily. Continuous therapy to prevent UTI       BP 189/83  Pulse 72  Temp 98.1 F (36.7 C) (Oral)  Resp 16  SpO2 96%  Physical Exam  Nursing note and vitals reviewed. Constitutional: She appears well-developed and well-nourished. No distress.  HENT:  Head: Normocephalic and atraumatic.  Eyes: Conjunctivae and EOM are normal.  Cardiovascular: Normal rate and regular rhythm.   Pulmonary/Chest: Effort normal and breath sounds normal. No stridor. No respiratory distress.  Abdominal: She exhibits no distension.  Musculoskeletal: She exhibits no edema.  Neurological: She is alert. She displays no atrophy. No cranial nerve deficit or sensory deficit. She exhibits normal muscle tone. Coordination normal.       Oriented to self (and grossly to place), not to date.  Skin: Skin is warm and dry.       Multiple small ecchymotic lesions, no ongoing bleeding.  Psychiatric: She has a normal mood and affect.    ED Course  Procedures (including critical  care time)   Labs Reviewed  URINALYSIS, ROUTINE W REFLEX MICROSCOPIC  CBC  DIFFERENTIAL  COMPREHENSIVE METABOLIC PANEL  LACTIC ACID, PLASMA  CARDIAC PANEL(CRET KIN+CKTOT+MB+TROPI)  URINE CULTURE   No results found.   No diagnosis found.  Pulse ox 100% ra, normal   Date: 03/08/2012  Rate: 68  Rhythm: normal sinus rhythm  QRS Axis: normal  Intervals: normal  ST/T Wave abnormalities: nonspecific T wave changes  Conduction Disutrbances:right bundle branch block  Narrative Interpretation:   Old EKG Reviewed: changes noted  ABNORMAL  MDM  This 78 year old female presents with concerns of altered mental status.  On exam the patient is oriented to herself, though not to the date.  The patient has no notable physical exam findings.  The patient is mildly hypertensive.  Given the patient's prescription of altered mental status, there is concern for infectious versus CVA versus dysrhythmia etiologies.  However, the patient is on Aggrenox, making dysrhythmia, thromboembolism less likely.  The patient's labs are notable for mild elevation in her cardiac enzymes.  Although the remainder the patient's labs were largely unremarkable, culture was sent for possible occult UTI.  The patient was admitted for further evaluation and management of this episode of altered mental status.      Gerhard Munch, MD 03/08/12 1942

## 2012-03-09 ENCOUNTER — Encounter (HOSPITAL_COMMUNITY): Payer: Self-pay | Admitting: *Deleted

## 2012-03-09 ENCOUNTER — Other Ambulatory Visit: Payer: Self-pay

## 2012-03-09 DIAGNOSIS — N3020 Other chronic cystitis without hematuria: Secondary | ICD-10-CM

## 2012-03-09 DIAGNOSIS — F0390 Unspecified dementia without behavioral disturbance: Secondary | ICD-10-CM

## 2012-03-09 DIAGNOSIS — R7989 Other specified abnormal findings of blood chemistry: Secondary | ICD-10-CM

## 2012-03-09 DIAGNOSIS — R4182 Altered mental status, unspecified: Secondary | ICD-10-CM

## 2012-03-09 LAB — BASIC METABOLIC PANEL
BUN: 15 mg/dL (ref 6–23)
Chloride: 96 mEq/L (ref 96–112)
GFR calc Af Amer: 45 mL/min — ABNORMAL LOW (ref >90)
Potassium: 3.2 mEq/L — ABNORMAL LOW (ref 3.5–5.1)

## 2012-03-09 LAB — CBC
HCT: 41.8 % (ref 36.0–46.0)
Hemoglobin: 13.9 g/dL (ref 12.0–15.0)
MCHC: 33.3 g/dL (ref 30.0–36.0)
WBC: 8.6 10*3/uL (ref 4.0–10.5)

## 2012-03-09 LAB — CARDIAC PANEL(CRET KIN+CKTOT+MB+TROPI)
CK, MB: 4.6 ng/mL — ABNORMAL HIGH (ref 0.3–4.0)
CK, MB: 4.7 ng/mL — ABNORMAL HIGH (ref 0.3–4.0)
Relative Index: 3.5 — ABNORMAL HIGH (ref 0.0–2.5)
Relative Index: 4.5 — ABNORMAL HIGH (ref 0.0–2.5)
Total CK: 107 U/L (ref 7–177)
Total CK: 136 U/L (ref 7–177)
Troponin I: <0.3 ng/mL (ref <0.30)

## 2012-03-09 LAB — FOLATE RBC: RBC Folate: 2478 ng/mL — ABNORMAL HIGH (ref >=366)

## 2012-03-09 LAB — URINE CULTURE: Culture  Setup Time: 201306140152

## 2012-03-09 LAB — RPR: RPR Ser Ql: NONREACTIVE

## 2012-03-09 MED ORDER — ENSURE COMPLETE PO LIQD
237.0000 mL | Freq: Two times a day (BID) | ORAL | Status: DC
  Administered 2012-03-09 – 2012-03-14 (×11): 237 mL via ORAL

## 2012-03-09 MED ORDER — LORAZEPAM 2 MG/ML IJ SOLN
0.5000 mg | Freq: Once | INTRAMUSCULAR | Status: AC
  Administered 2012-03-09: 0.5 mg via INTRAVENOUS
  Filled 2012-03-09: qty 1

## 2012-03-09 MED ORDER — POTASSIUM CHLORIDE CRYS ER 20 MEQ PO TBCR
40.0000 meq | EXTENDED_RELEASE_TABLET | Freq: Once | ORAL | Status: AC
  Administered 2012-03-09: 40 meq via ORAL
  Filled 2012-03-09: qty 2

## 2012-03-09 MED ORDER — HYDRALAZINE HCL 20 MG/ML IJ SOLN
10.0000 mg | Freq: Four times a day (QID) | INTRAMUSCULAR | Status: DC | PRN
  Administered 2012-03-09 – 2012-03-13 (×6): 10 mg via INTRAVENOUS
  Filled 2012-03-09 (×6): qty 1

## 2012-03-09 NOTE — Progress Notes (Signed)
INITIAL ADULT NUTRITION ASSESSMENT Date: 03/09/2012   Time: 10:22 AM Reason for Assessment: Low braden  ASSESSMENT: Female 78 y.o.  Dx: Altered mental status  Food/Nutrition Related Hx: Pt admitted with confusion for the past week. Pt with history of dementia, transient ischemic attacks, and chronic urinary tract infections. No family present. Nursing reports pt was awake all last night and combative. Met with pt, who was difficult to understand, however was able to tell me that she would like an Ensure - will order. Family reported in H&P that pt had decreased oral intake for the past week. Past records indicate pt's weight has been relatively stable for the past 6 months. RN reports pt has not eaten anything today. Will encourage nursing to offer snacks/meals/supplements throughout the day.   Hx:  Past Medical History  Diagnosis Date  . Peripheral neuropathy   . Hypertension   . TIA (transient ischemic attack)   . Elevated sed rate   . Temporal arteritis   . Hypothyroidism   . Cancer     colon  . History of hernia repair   . S/P rotator cuff surgery     right   Related Meds:  Scheduled Meds:   . cefTRIAXone (ROCEPHIN)  IV  1 g Intravenous QHS  . dipyridamole-aspirin  1 capsule Oral BID  . donepezil  10 mg Oral QHS  . levothyroxine  150 mcg Oral Daily  . pantoprazole  40 mg Oral Q1200  . potassium chloride  40 mEq Oral Once  . predniSONE  10 mg Oral QODAY  . predniSONE  15 mg Oral QODAY  . sertraline  50 mg Oral Daily  . simvastatin  20 mg Oral QHS  . sodium chloride  3 mL Intravenous Q12H   Continuous Infusions:   . sodium chloride 75 mL/hr at 03/08/12 2300   PRN Meds:.acetaminophen, acetaminophen, hydrALAZINE, ondansetron (ZOFRAN) IV, ondansetron  Ht: 5\' 10"  (177.8 cm)  Wt: 202 lb 2.6 oz (91.7 kg)  Ideal Wt: 150 lb % Ideal Wt: 135  Usual Wt: 200 lb in January 2013 % Usual Wt: 101  Wt Readings from Last 10 Encounters:  03/08/12 202 lb 2.6 oz (91.7 kg)    09/28/11 200 lb (90.719 kg)     Body mass index is 29.01 kg/(m^2).   Labs:  CMP     Component Value Date/Time   NA 135 03/09/2012 0650   K 3.2* 03/09/2012 0650   CL 96 03/09/2012 0650   CO2 27 03/09/2012 0650   GLUCOSE 91 03/09/2012 0650   BUN 15 03/09/2012 0650   CREATININE 1.26* 03/09/2012 0650   CALCIUM 9.0 03/09/2012 0650   PROT 7.2 03/08/2012 1610   ALBUMIN 3.7 03/08/2012 1610   AST 29 03/08/2012 1610   ALT 20 03/08/2012 1610   ALKPHOS 68 03/08/2012 1610   BILITOT 0.5 03/08/2012 1610   GFRNONAA 39* 03/09/2012 0650   GFRAA 45* 03/09/2012 0650    Intake/Output Summary (Last 24 hours) at 03/09/12 1028 Last data filed at 03/08/12 2226  Gross per 24 hour  Intake    120 ml  Output      0 ml  Net    120 ml   Last BM - 03/09/12  Diet Order: Sodium Restricted   IVF:    sodium chloride Last Rate: 75 mL/hr at 03/08/12 2300    Estimated Nutritional Needs:   Kcal: 1850-2000 Protein: 90-110g Fluid: 1.8-2L  NUTRITION DIAGNOSIS: -Inadequate oral intake (NI-2.1).  Status: Ongoing  RELATED TO: altered mental  status  AS EVIDENCE BY: 0% meal intake today per nursing  MONITORING/EVALUATION(Goals): Pt to consume >75% of meals/supplements  EDUCATION NEEDS: -Education not appropriate at this time  INTERVENTION: Ensure Complete BID. Nursing to continue to encourage increased meal/supplement intake. Changed diet order to non-selective so house tray will be brought up automatically at meal times. Will monitor.   Dietitian #: [phone]  DOCUMENTATION CODES Per approved criteria  -Not Applicable    Marshall Cork 03/09/2012, 10:22 AM

## 2012-03-09 NOTE — Evaluation (Signed)
Physical Therapy Evaluation Patient Details Name: [name] MRN: [id number] DOB: 18-Dec-1929 Today's Date: 03/09/2012 Time: 4098-1191 PT Time Calculation (min): 23 min  PT Assessment / Plan / Recommendation Clinical Impression  Pt admitted with altered mental status, chronic UTIs and generalized weakness.  Pt would benefit from acute PT services in order to improve independence with bed mobility, transfers, and ambulation by improving activity tolerance and strength to prepare for d/c to next venue.  No family present on evaluation and pt with hx dementia.    PT Assessment  Patient needs continued PT services    Follow Up Recommendations  Supervision/Assistance - 24 hour;Skilled nursing facility    Barriers to Discharge        lEquipment Recommendations  Defer to next venue    Recommendations for Other Services     Frequency Min 3X/week    Precautions / Restrictions Precautions Precautions: Fall   Pertinent Vitals/Pain BP supine 181/80 (RN came to check BP)      Mobility  Bed Mobility Bed Mobility: Sit to Supine;Supine to Sit Supine to Sit: 3: Mod assist;With rails Sit to Supine: 3: Mod assist;HOB flat Details for Bed Mobility Assistance: assist for LEs and trunk with supine to sit and LEs for sit to supine, RN in to check BP 181/1mmHg so only planned to get to chair if able Transfers Transfers: Sit to Stand Sit to Stand: 3: Mod assist;From bed Details for Transfer Assistance: pt attempted to stand and able to lift bottom off bed however fatigued quickly and reported feeling funny with forward slumped posture, still able to talk though so assisted back to supine position and pt reported feeling better Ambulation/Gait Ambulation/Gait Assistance: Not tested (comment)    Exercises     PT Diagnosis: Difficulty walking;Generalized weakness  PT Problem List: Decreased strength;Decreased activity tolerance;Decreased mobility;Decreased knowledge of use of DME;Decreased  safety awareness PT Treatment Interventions: DME instruction;Gait training;Functional mobility training;Therapeutic activities;Therapeutic exercise;Balance training;Patient/family education   PT Goals Acute Rehab PT Goals PT Goal Formulation: Patient unable to participate in goal setting Time For Goal Achievement: 03/23/12 Potential to Achieve Goals: Fair Pt will go Supine/Side to Sit: with supervision PT Goal: Supine/Side to Sit - Progress: Goal set today Pt will go Sit to Supine/Side: with supervision PT Goal: Sit to Supine/Side - Progress: Goal set today Pt will go Sit to Stand: with min assist PT Goal: Sit to Stand - Progress: Goal set today Pt will go Stand to Sit: with min assist PT Goal: Stand to Sit - Progress: Goal set today Pt will Ambulate: with min assist;16 - 50 feet;with least restrictive assistive device PT Goal: Ambulate - Progress: Goal set today  Visit Information  Last PT Received On: 03/09/12 Assistance Needed: +2    Subjective Data  Subjective: "ok"  (to sitting EOB)   Prior Functioning  Home Living Additional Comments: Pt with cognitivie deficits.  No family present. Communication Communication: No difficulties    Cognition  Overall Cognitive Status: No family/caregiver present to determine baseline cognitive functioning Arousal/Alertness: Lethargic Orientation Level: Disoriented to;Time Behavior During Session: Lethargic Cognition - Other Comments: Able to follow simple commands with time    Extremity/Trunk Assessment Right Lower Extremity Assessment RLE ROM/Strength/Tone: Deficits RLE ROM/Strength/Tone Deficits: grossly 2+/5 throughout per functional observation Left Lower Extremity Assessment LLE ROM/Strength/Tone: Deficits LLE ROM/Strength/Tone Deficits: grossly 2+/5 throughout per functional observation   Balance    End of Session PT - End of Session Equipment Utilized During Treatment: Gait belt Activity Tolerance: Patient limited  by  fatigue Patient left: in bed;with bed alarm set;with call bell/phone within reach   [facility] E 03/09/2012, 11:51 AM Pager: [phone]

## 2012-03-09 NOTE — Progress Notes (Signed)
CARE MANAGEMENT NOTE 03/09/2012  Patient:  BRANIYA, FARRUGIA A   Account Number:  [id number]  Date Initiated:  03/09/2012  Documentation initiated by:  Jerica Creegan  Subjective/Objective Assessment:   pt from home setting with decreased po intake, increased weakness,failed outpt treatment for urinary tract infections     Action/Plan:   lives at home   Anticipated DC Date:  03/12/2012   Anticipated DC Plan:  SKILLED NURSING FACILITY  In-house referral  Clinical Social Worker      DC Planning Services  NA  NA  NA      [facility] Choice  NA   Choice offered to / List presented to:  NA   DME arranged  NA      DME agency  NA     HH arranged  NA      HH agency  NA   Status of service:  In process, will continue to follow Medicare Important Message given?  NA - LOS <3 / Initial given by admissions (If response is "NO", the following Medicare IM given date fields will be blank) Date Medicare IM given:   Date Additional Medicare IM given:    Discharge Disposition:    Per UR Regulation:  Reviewed for med. necessity/level of care/duration of stay  If discussed at Long Length of Stay Meetings, dates discussed:    Comments:  [id number]/Rykin Route,RN,BSN,CCM

## 2012-03-09 NOTE — Progress Notes (Signed)
Subjective: No family at bedside Patient oriented to place, name  Objective: Vital signs in last 24 hours: Filed Vitals:   03/08/12 2006 03/08/12 2012 03/08/12 2300 03/09/12 0600  BP: 153/64 177/79 191/77 192/69  Pulse: 86 86 77 90  Temp:  98.1 F (36.7 C) 97.8 F (36.6 C) 97.4 F (36.3 C)  TempSrc:  Oral Oral Oral  Resp: 17 16 20 22   Height:   5\' 10"  (1.778 m)   Weight:   91.7 kg (202 lb 2.6 oz)   SpO2: 94% 96% 94% 94%   Weight change:   Intake/Output Summary (Last 24 hours) at 03/09/12 0924 Last data filed at 03/08/12 2226  Gross per 24 hour  Intake    120 ml  Output      0 ml  Net    120 ml    Physical Exam: General: Awake, Oriented to place/person, No acute distress. HEENT: EOMI. Neck: Supple CV: S1 and S2, rrr Lungs: Clear to ascultation bilaterally, no wheezing Abdomen: Soft, Nontender, Nondistended, +bowel sounds. Ext: Good pulses. Trace edema.   Lab Results:  [facility] 03/09/12 0650 03/08/12 1610  NA 135 135  K 3.2* 3.8  CL 96 92*  CO2 27 26  GLUCOSE 91 90  BUN 15 18  CREATININE 1.26* 1.34*  CALCIUM 9.0 10.1  MG -- --  PHOS -- --    Basename 03/08/12 1610  AST 29  ALT 20  ALKPHOS 68  BILITOT 0.5  PROT 7.2  ALBUMIN 3.7   No results found for this basename: LIPASE:2,AMYLASE:2 in the last 72 hours  Basename 03/09/12 0650 03/08/12 1610  WBC 8.6 7.9  NEUTROABS -- 5.6  HGB 13.9 14.1  HCT 41.8 42.1  MCV 97.7 97.7  PLT 264 309    Basename 03/09/12 0650 03/08/12 2330 03/08/12 1610  CKTOTAL 106 136 131  CKMB 4.8* 4.7* 5.5*  CKMBINDEX -- -- --  TROPONINI <0.30 <0.30 <0.30   No components found with this basename: POCBNP:3 No results found for this basename: DDIMER:2 in the last 72 hours No results found for this basename: HGBA1C:2 in the last 72 hours No results found for this basename: CHOL:2,HDL:2,LDLCALC:2,TRIG:2,CHOLHDL:2,LDLDIRECT:2 in the last 72 hours  Basename 03/08/12 2330  TSH 2.540  T4TOTAL --  T3FREE --  THYROIDAB --     Basename 03/08/12 2330  VITAMINB12 979*  FOLATE --  FERRITIN --  TIBC --  IRON --  RETICCTPCT --    Micro Results: No results found for this or any previous visit (from the past 240 hour(s)).  Studies/Results: Ct Head Wo Contrast  03/08/2012  *RADIOLOGY REPORT*  Clinical Data: Confusion, altered mental status  CT HEAD WITHOUT CONTRAST  Technique:  Contiguous axial images were obtained from the base of the skull through the vertex without contrast.  Comparison: Brain MRI 09/22/2011, head CT 06/18/2010  Findings: No acute intracranial hemorrhage.  No focal mass lesion. No CT evidence of acute infarction.   No midline shift or mass effect.  No hydrocephalus.  Basilar cisterns are patent. Ventricles are large unchanged from prior.  There is extensive white matter hypodensities within the periventricular white matter.  Findings are unchanged from multiple comparison exams.  There is generalized cortical atrophy.  No CT evidence of acute cortical infarction.  Paranasal sinuses and mastoid air cells are clear.  Orbits are normal.  IMPRESSION:  1.  No acute intracranial findings. 2.  No change from prior. 3.  Ventricular dilatation and periventricular white matter hypodensities are unchanged from prior.  Original Report Authenticated By: Genevive Bi, M.D.   Dg Chest Port 1 View  03/08/2012  *RADIOLOGY REPORT*  Clinical Data: Altered mental status  PORTABLE CHEST - 1 VIEW  Comparison: 12/03/2010  Findings: Cardiomegaly again noted.  Stable chronic bronchitic changes.  No acute infiltrate or pulmonary edema.  Stable chronic blunting of the left costophrenic angle.  Atherosclerotic calcifications of thoracic aorta.  Stable degenerative changes thoracic spine.  IMPRESSION: No active disease.  Stable chronic bronchitic changes. Are noted chronic blunting of the left costophrenic angle.  Original Report Authenticated By: Natasha Mead, M.D.    Medications: I have reviewed the patient's current  medications. Scheduled Meds:   . cefTRIAXone (ROCEPHIN)  IV  1 g Intravenous QHS  . dipyridamole-aspirin  1 capsule Oral BID  . donepezil  10 mg Oral QHS  . levothyroxine  150 mcg Oral Daily  . pantoprazole  40 mg Oral Q1200  . predniSONE  10 mg Oral QODAY  . predniSONE  15 mg Oral QODAY  . sertraline  50 mg Oral Daily  . simvastatin  20 mg Oral QHS  . sodium chloride  3 mL Intravenous Q12H   Continuous Infusions:   . sodium chloride 75 mL/hr at 03/08/12 2300   PRN Meds:.acetaminophen, acetaminophen, hydrALAZINE, ondansetron (ZOFRAN) IV, ondansetron  Assessment/Plan:   *Altered mental status   Generalized weakness- PT, ? UTI   Chronic UTI- rocephin day #2  CKD- trend   Hypothyroidism- synthroid   Hyperlipidemia- stable   Depression- stable   Dementia- ? Baseline- await for family  hypokalemia- replace    LOS: 1 day  Bright Spielmann, DO 03/09/2012, 9:24 AM

## 2012-03-10 ENCOUNTER — Encounter (HOSPITAL_COMMUNITY): Payer: Self-pay

## 2012-03-10 DIAGNOSIS — F0390 Unspecified dementia without behavioral disturbance: Secondary | ICD-10-CM

## 2012-03-10 DIAGNOSIS — R4182 Altered mental status, unspecified: Secondary | ICD-10-CM

## 2012-03-10 DIAGNOSIS — R7989 Other specified abnormal findings of blood chemistry: Secondary | ICD-10-CM

## 2012-03-10 DIAGNOSIS — N3020 Other chronic cystitis without hematuria: Secondary | ICD-10-CM

## 2012-03-10 NOTE — Progress Notes (Signed)
Subjective: No family at bedside Patient oriented to place, name  Objective: Vital signs in last 24 hours: Filed Vitals:   03/09/12 1334 03/09/12 2230 03/10/12 0625 03/10/12 0744  BP: 147/70 184/65 160/81 177/66  Pulse: 101 99 97 99  Temp: 98.3 F (36.8 C) 98.7 F (37.1 C) 98 F (36.7 C) 98.5 F (36.9 C)  TempSrc:  Oral Oral Oral  Resp: 16 22 20 18   Height:      Weight:      SpO2: 94% 94% 93% 96%   Weight change:   Intake/Output Summary (Last 24 hours) at 03/10/12 1211 Last data filed at 03/09/12 1400  Gross per 24 hour  Intake    840 ml  Output      0 ml  Net    840 ml    Physical Exam: General: Awake, Oriented to place/person, No acute distress. HEENT: EOMI. Neck: Supple CV: S1 and S2, rrr Lungs: Clear to ascultation bilaterally, no wheezing Abdomen: Soft, Nontender, Nondistended, +bowel sounds. Ext: Good pulses. Trace edema.   Lab Results:  [facility] 03/09/12 0650 03/08/12 1610  NA 135 135  K 3.2* 3.8  CL 96 92*  CO2 27 26  GLUCOSE 91 90  BUN 15 18  CREATININE 1.26* 1.34*  CALCIUM 9.0 10.1  MG -- --  PHOS -- --    Basename 03/08/12 1610  AST 29  ALT 20  ALKPHOS 68  BILITOT 0.5  PROT 7.2  ALBUMIN 3.7   No results found for this basename: LIPASE:2,AMYLASE:2 in the last 72 hours  Basename 03/09/12 0650 03/08/12 1610  WBC 8.6 7.9  NEUTROABS -- 5.6  HGB 13.9 14.1  HCT 41.8 42.1  MCV 97.7 97.7  PLT 264 309    Basename 03/09/12 1452 03/09/12 0650 03/08/12 2330  CKTOTAL 107 106 136  CKMB 4.6* 4.8* 4.7*  CKMBINDEX -- -- --  TROPONINI <0.30 <0.30 <0.30   No components found with this basename: POCBNP:3 No results found for this basename: DDIMER:2 in the last 72 hours No results found for this basename: HGBA1C:2 in the last 72 hours No results found for this basename: CHOL:2,HDL:2,LDLCALC:2,TRIG:2,CHOLHDL:2,LDLDIRECT:2 in the last 72 hours  Basename 03/08/12 2330  TSH 2.540  T4TOTAL --  T3FREE --  THYROIDAB --    Basename 03/08/12  2330  VITAMINB12 979*  FOLATE --  FERRITIN --  TIBC --  IRON --  RETICCTPCT --    Micro Results: Recent Results (from the past 240 hour(s))  URINE CULTURE     Status: Normal   Collection Time   03/08/12  4:30 PM      Component Value Range Status Comment   Specimen Description URINE, CATHETERIZED   Final    Special Requests NONE   Final    Culture  Setup Time [id number]   Final    Colony Count NO GROWTH   Final    Culture NO GROWTH   Final    Report Status 03/09/2012 FINAL   Final     Studies/Results: Ct Head Wo Contrast  03/08/2012  *RADIOLOGY REPORT*  Clinical Data: Confusion, altered mental status  CT HEAD WITHOUT CONTRAST  Technique:  Contiguous axial images were obtained from the base of the skull through the vertex without contrast.  Comparison: Brain MRI 09/22/2011, head CT 06/18/2010  Findings: No acute intracranial hemorrhage.  No focal mass lesion. No CT evidence of acute infarction.   No midline shift or mass effect.  No hydrocephalus.  Basilar cisterns are patent. Ventricles are large  unchanged from prior.  There is extensive white matter hypodensities within the periventricular white matter.  Findings are unchanged from multiple comparison exams.  There is generalized cortical atrophy.  No CT evidence of acute cortical infarction.  Paranasal sinuses and mastoid air cells are clear.  Orbits are normal.  IMPRESSION:  1.  No acute intracranial findings. 2.  No change from prior. 3.  Ventricular dilatation and periventricular white matter hypodensities are unchanged from prior.  Original Report Authenticated By: Genevive Bi, M.D.   Dg Chest Port 1 View  03/08/2012  *RADIOLOGY REPORT*  Clinical Data: Altered mental status  PORTABLE CHEST - 1 VIEW  Comparison: 12/03/2010  Findings: Cardiomegaly again noted.  Stable chronic bronchitic changes.  No acute infiltrate or pulmonary edema.  Stable chronic blunting of the left costophrenic angle.  Atherosclerotic calcifications of  thoracic aorta.  Stable degenerative changes thoracic spine.  IMPRESSION: No active disease.  Stable chronic bronchitic changes. Are noted chronic blunting of the left costophrenic angle.  Original Report Authenticated By: Natasha Mead, M.D.    Medications: I have reviewed the patient's current medications. Scheduled Meds:    . cefTRIAXone (ROCEPHIN)  IV  1 g Intravenous QHS  . dipyridamole-aspirin  1 capsule Oral BID  . donepezil  10 mg Oral QHS  . feeding supplement  237 mL Oral BID BM  . levothyroxine  150 mcg Oral Daily  . LORazepam  0.5 mg Intravenous Once  . pantoprazole  40 mg Oral Q1200  . potassium chloride  40 mEq Oral Once  . predniSONE  10 mg Oral QODAY  . predniSONE  15 mg Oral QODAY  . sertraline  50 mg Oral Daily  . simvastatin  20 mg Oral QHS  . sodium chloride  3 mL Intravenous Q12H   Continuous Infusions:    . sodium chloride 75 mL/hr at 03/10/12 1103   PRN Meds:.acetaminophen, acetaminophen, hydrALAZINE, ondansetron (ZOFRAN) IV, ondansetron  Assessment/Plan:   *Altered mental status- seems to be slowly improving Daughter at bedside and updated   Generalized weakness- PT- recommends SNF, ? UTI   Chronic UTI- rocephin day #3  CKD- trend   Hypothyroidism- synthroid   Hyperlipidemia- stable   Depression- stable   Dementia- ? Baseline- await for family  HTN- leave a little high at patient is elderly and has drop in BP when she gets up per daughter  hypokalemia- replace   Await SNF placement   LOS: 2 days  Stephfon Bovey, DO 03/10/2012, 12:11 PM

## 2012-03-11 DIAGNOSIS — N3020 Other chronic cystitis without hematuria: Secondary | ICD-10-CM

## 2012-03-11 DIAGNOSIS — R4182 Altered mental status, unspecified: Secondary | ICD-10-CM

## 2012-03-11 DIAGNOSIS — R7989 Other specified abnormal findings of blood chemistry: Secondary | ICD-10-CM

## 2012-03-11 DIAGNOSIS — F0390 Unspecified dementia without behavioral disturbance: Secondary | ICD-10-CM

## 2012-03-11 NOTE — Progress Notes (Signed)
Subjective: No family at bedside Patient oriented - more awake and conversant today No c/o fever/chills  Objective: Vital signs in last 24 hours: Filed Vitals:   03/10/12 0744 03/10/12 1326 03/10/12 2200 03/11/12 0600  BP: 177/66 129/74 124/77 194/75  Pulse: 99 94 75 92  Temp: 98.5 F (36.9 C) 98.7 F (37.1 C) 97.9 F (36.6 C) 98.4 F (36.9 C)  TempSrc: Oral Oral Oral Oral  Resp: 18 18 20 20   Height:      Weight:      SpO2: 96% 95% 98% 98%   Weight change:  No intake or output data in the 24 hours ending 03/11/12 1112  Physical Exam: General: Awake, Oriented to place/person/time, No acute distress. HEENT: EOMI. Neck: Supple CV: S1 and S2, rrr Lungs: Clear to ascultation bilaterally, no wheezing Abdomen: Soft, Nontender, Nondistended, +bowel sounds. Ext: Good pulses. Trace edema.   Lab Results:  [facility] 03/09/12 0650 03/08/12 1610  NA 135 135  K 3.2* 3.8  CL 96 92*  CO2 27 26  GLUCOSE 91 90  BUN 15 18  CREATININE 1.26* 1.34*  CALCIUM 9.0 10.1  MG -- --  PHOS -- --    Basename 03/08/12 1610  AST 29  ALT 20  ALKPHOS 68  BILITOT 0.5  PROT 7.2  ALBUMIN 3.7   No results found for this basename: LIPASE:2,AMYLASE:2 in the last 72 hours  Basename 03/09/12 0650 03/08/12 1610  WBC 8.6 7.9  NEUTROABS -- 5.6  HGB 13.9 14.1  HCT 41.8 42.1  MCV 97.7 97.7  PLT 264 309    Basename 03/09/12 1452 03/09/12 0650 03/08/12 2330  CKTOTAL 107 106 136  CKMB 4.6* 4.8* 4.7*  CKMBINDEX -- -- --  TROPONINI <0.30 <0.30 <0.30   No components found with this basename: POCBNP:3 No results found for this basename: DDIMER:2 in the last 72 hours No results found for this basename: HGBA1C:2 in the last 72 hours No results found for this basename: CHOL:2,HDL:2,LDLCALC:2,TRIG:2,CHOLHDL:2,LDLDIRECT:2 in the last 72 hours  Basename 03/08/12 2330  TSH 2.540  T4TOTAL --  T3FREE --  THYROIDAB --    Basename 03/08/12 2330  VITAMINB12 979*  FOLATE --  FERRITIN --  TIBC --   IRON --  RETICCTPCT --    Micro Results: Recent Results (from the past 240 hour(s))  URINE CULTURE     Status: Normal   Collection Time   03/08/12  4:30 PM      Component Value Range Status Comment   Specimen Description URINE, CATHETERIZED   Final    Special Requests NONE   Final    Culture  Setup Time [id number]   Final    Colony Count NO GROWTH   Final    Culture NO GROWTH   Final    Report Status 03/09/2012 FINAL   Final     Studies/Results: No results found.  Medications: I have reviewed the patient's current medications. Scheduled Meds:    . cefTRIAXone (ROCEPHIN)  IV  1 g Intravenous QHS  . dipyridamole-aspirin  1 capsule Oral BID  . donepezil  10 mg Oral QHS  . feeding supplement  237 mL Oral BID BM  . levothyroxine  150 mcg Oral Daily  . pantoprazole  40 mg Oral Q1200  . predniSONE  10 mg Oral QODAY  . predniSONE  15 mg Oral QODAY  . sertraline  50 mg Oral Daily  . simvastatin  20 mg Oral QHS  . sodium chloride  3 mL Intravenous Q12H  Continuous Infusions:    . sodium chloride 75 mL/hr at 03/10/12 1103   PRN Meds:.acetaminophen, acetaminophen, hydrALAZINE, ondansetron (ZOFRAN) IV, ondansetron  Assessment/Plan:   *Altered mental status- seems to be improving   Generalized weakness- PT- recommends SNF, ? UTI   Chronic UTI- rocephin day #4  CKD- trend   Hypothyroidism- synthroid   Hyperlipidemia- stable   Depression- stable   Dementia- ? Baseline- await for family  HTN- leave a little high at patient is elderly and has drop in BP when she gets up per daughter  hypokalemia- replace   Await SNF placement- social worker   LOS: 3 days  Gryphon Vanderveen, DO 03/11/2012, 11:12 AM

## 2012-03-12 DIAGNOSIS — F0390 Unspecified dementia without behavioral disturbance: Secondary | ICD-10-CM

## 2012-03-12 DIAGNOSIS — R4182 Altered mental status, unspecified: Secondary | ICD-10-CM

## 2012-03-12 DIAGNOSIS — R7989 Other specified abnormal findings of blood chemistry: Secondary | ICD-10-CM

## 2012-03-12 DIAGNOSIS — N3020 Other chronic cystitis without hematuria: Secondary | ICD-10-CM

## 2012-03-12 MED ORDER — METOPROLOL TARTRATE 12.5 MG HALF TABLET
12.5000 mg | ORAL_TABLET | Freq: Two times a day (BID) | ORAL | Status: DC
  Administered 2012-03-12 – 2012-03-14 (×5): 12.5 mg via ORAL
  Filled 2012-03-12 (×6): qty 1

## 2012-03-12 NOTE — Progress Notes (Signed)
Physical Therapy Treatment Patient Details Name: [name] MRN: [id number] DOB: [birth year] Today's Date: 03/12/2012 Time: 4098-1191 PT Time Calculation (min): 21 min  PT Assessment / Plan / Recommendation Comments on Treatment Session  Pt able to ambulate 40 feet with RW in hallway.  Daughter present today and reports pt normally modified independent with RW for mobility.    Follow Up Recommendations  Skilled nursing facility    Barriers to Discharge        Equipment Recommendations  Defer to next venue    Recommendations for Other Services    Frequency     Plan Discharge plan remains appropriate;Frequency remains appropriate    Precautions / Restrictions Precautions Precautions: Fall Restrictions Weight Bearing Restrictions: No   Pertinent Vitals/Pain No pain    Mobility  Transfers Transfers: Sit to Stand;Stand to Sit Sit to Stand: 4: Min assist;With upper extremity assist;From chair/3-in-1 Stand to Sit: 4: Min assist;With upper extremity assist;To chair/3-in-1 Details for Transfer Assistance: assist for weakness and steadying, verbal cues for safe technique Ambulation/Gait Ambulation/Gait Assistance: 4: Min assist Ambulation Distance (Feet): 40 Feet Assistive device: Rolling walker Ambulation/Gait Assistance Details: +2 for safety, minA for steadying, pt reported feeling "funny" upon returning to room but unable to explain in further detail however reported feeling better once sitting down. Gait Pattern: Step-through pattern;Decreased stride length;Decreased dorsiflexion - left;Decreased dorsiflexion - right Gait velocity: decreased General Gait Details: daughter reports severe peripheral neuropathy and that pt always uses RW at home    Exercises     PT Diagnosis:    PT Problem List:   PT Treatment Interventions:     PT Goals Acute Rehab PT Goals Pt will go Sit to Stand: with supervision PT Goal: Sit to Stand - Progress: Updated due to goal met Pt will  go Stand to Sit: with supervision PT Goal: Stand to Sit - Progress: Updated due to goals met Pt will Ambulate: with supervision;>150 feet;with rolling walker PT Goal: Ambulate - Progress: Updated due to goal met  Visit Information  Last PT Received On: 03/12/12 Assistance Needed: +2    Subjective Data  Subjective: "my bottom's wet."  (pt urinated in recliner)   Cognition  Overall Cognitive Status: History of cognitive impairments - at baseline Arousal/Alertness: Awake/alert Cognition - Other Comments: More awake/alert today, able to follow commands, daughter in room and reports pt is doing better today    Balance     End of Session PT - End of Session Equipment Utilized During Treatment: Gait belt Activity Tolerance: Patient limited by fatigue Patient left: in chair;with call bell/phone within reach;with family/visitor present    [name],[name] 03/12/2012, 12:18 PM Pager: [phone]

## 2012-03-12 NOTE — Clinical Social Work Psychosocial (Unsigned)
     Clinical Social Work Department BRIEF PSYCHOSOCIAL ASSESSMENT 03/12/2012  Patient:  ANIYIAH, ZELL     Account Number:  [id number]     Admit date:  03/08/2012  Clinical Social Worker:  Hattie Perch  Date/Time:  03/12/2012 12:00 M  Referred by:  Physician  Date Referred:  03/12/2012 Referred for  SNF Placement   Other Referral:   Interview type:  Family Other interview type:    PSYCHOSOCIAL DATA Living Status:  FAMILY Admitted from facility:   Level of care:   Primary support name:  [name] Primary support relationship to patient:  CHILD, ADULT Degree of support available:   good    CURRENT CONCERNS Current Concerns  Post-Acute Placement   Other Concerns:    SOCIAL WORK ASSESSMENT / PLAN patient is confused. patient in need of snf placement. spoke with patients daughter, discussed need for snf. she is agreeable to being faxed out and is deciding whether or not patient will go to snf or assisted living.   Assessment/plan status:   Other assessment/ plan:   Information/referral to community resources:    PATIENTS/FAMILYS RESPONSE TO PLAN OF CARE: daughter agreeable to being faxed out and recieving bed offers.

## 2012-03-12 NOTE — Progress Notes (Signed)
Subjective: Patient oriented - more awake and conversant today No c/o fever/chills   Objective: Vital signs in last 24 hours: Filed Vitals:   03/10/12 2200 03/11/12 0600 03/11/12 1428 03/12/12 0603  BP: 124/77 194/75 144/61 200/78  Pulse: 75 92 96 90  Temp: 97.9 F (36.6 C) 98.4 F (36.9 C) 98.9 F (37.2 C) 98.3 F (36.8 C)  TempSrc: Oral Oral Oral Oral  Resp: 20 20 20 18   Height:      Weight:      SpO2: 98% 98% 96%    Weight change:   Intake/Output Summary (Last 24 hours) at 03/12/12 1010 Last data filed at 03/11/12 1428  Gross per 24 hour  Intake    360 ml  Output      0 ml  Net    360 ml    Physical Exam: General: Awake, Oriented to place/person/time, No acute distress. HEENT: EOMI. Neck: Supple CV: S1 and S2, rrr Lungs: Clear to ascultation bilaterally, no wheezing Abdomen: Soft, Nontender, Nondistended, +bowel sounds. Ext: Good pulses. Trace edema.   Lab Results: No results found for this basename: NA:2,K:2,CL:2,CO2:2,GLUCOSE:2,BUN:2,CREATININE:2,CALCIUM:2,MG:2,PHOS:2 in the last 72 hours No results found for this basename: AST:2,ALT:2,ALKPHOS:2,BILITOT:2,PROT:2,ALBUMIN:2 in the last 72 hours No results found for this basename: LIPASE:2,AMYLASE:2 in the last 72 hours No results found for this basename: WBC:2,NEUTROABS:2,HGB:2,HCT:2,MCV:2,PLT:2 in the last 72 hours  Basename 03/09/12 1452  CKTOTAL 107  CKMB 4.6*  CKMBINDEX --  TROPONINI <0.30   No components found with this basename: POCBNP:3 No results found for this basename: DDIMER:2 in the last 72 hours No results found for this basename: HGBA1C:2 in the last 72 hours No results found for this basename: CHOL:2,HDL:2,LDLCALC:2,TRIG:2,CHOLHDL:2,LDLDIRECT:2 in the last 72 hours No results found for this basename: TSH,T4TOTAL,FREET3,T3FREE,THYROIDAB in the last 72 hours No results found for this basename: VITAMINB12:2,FOLATE:2,FERRITIN:2,TIBC:2,IRON:2,RETICCTPCT:2 in the last 72 hours  Micro  Results: Recent Results (from the past 240 hour(s))  URINE CULTURE     Status: Normal   Collection Time   03/08/12  4:30 PM      Component Value Range Status Comment   Specimen Description URINE, CATHETERIZED   Final    Special Requests NONE   Final    Culture  Setup Time [id number]   Final    Colony Count NO GROWTH   Final    Culture NO GROWTH   Final    Report Status 03/09/2012 FINAL   Final     Studies/Results: No results found.  Medications: I have reviewed the patient's current medications. Scheduled Meds:    . cefTRIAXone (ROCEPHIN)  IV  1 g Intravenous QHS  . dipyridamole-aspirin  1 capsule Oral BID  . donepezil  10 mg Oral QHS  . feeding supplement  237 mL Oral BID BM  . levothyroxine  150 mcg Oral Daily  . pantoprazole  40 mg Oral Q1200  . predniSONE  10 mg Oral QODAY  . predniSONE  15 mg Oral QODAY  . sertraline  50 mg Oral Daily  . simvastatin  20 mg Oral QHS  . sodium chloride  3 mL Intravenous Q12H   Continuous Infusions:    . sodium chloride 75 mL/hr at 03/11/12 1311   PRN Meds:.acetaminophen, acetaminophen, hydrALAZINE, ondansetron (ZOFRAN) IV, ondansetron  Assessment/Plan:   *Altered mental status- seems to be improving   Generalized weakness- PT- recommends SNF, ? UTI   Chronic UTI- rocephin day #5/7  CKD- trend   Hypothyroidism- synthroid   Hyperlipidemia- stable   Depression- stable  Dementia- ? Baseline- await for family  HTN- add low dose BB, has drop in BP when she gets up per daughter  hypokalemia- replace   Await SNF placement- social worker   LOS: 4 days  Loistine Eberlin, DO 03/12/2012, 10:10 AM

## 2012-03-12 NOTE — H&P (Deleted)
Scheduled Meds:   . cefTRIAXone (ROCEPHIN)  IV  1 g Intravenous QHS  . dipyridamole-aspirin  1 capsule Oral BID  . donepezil  10 mg Oral QHS  . feeding supplement  237 mL Oral BID BM  . levothyroxine  150 mcg Oral Daily  . metoprolol tartrate  12.5 mg Oral BID  . pantoprazole  40 mg Oral Q1200  . predniSONE  10 mg Oral QODAY  . predniSONE  15 mg Oral QODAY  . sertraline  50 mg Oral Daily  . simvastatin  20 mg Oral QHS  . sodium chloride  3 mL Intravenous Q12H   Continuous Infusions:   . DISCONTD: sodium chloride 75 mL/hr at 03/11/12 1311   PRN Meds:.acetaminophen, acetaminophen, hydrALAZINE, ondansetron (ZOFRAN) IV, ondansetron

## 2012-03-13 DIAGNOSIS — R7989 Other specified abnormal findings of blood chemistry: Secondary | ICD-10-CM

## 2012-03-13 DIAGNOSIS — N3020 Other chronic cystitis without hematuria: Secondary | ICD-10-CM

## 2012-03-13 DIAGNOSIS — R4182 Altered mental status, unspecified: Secondary | ICD-10-CM

## 2012-03-13 DIAGNOSIS — F0390 Unspecified dementia without behavioral disturbance: Secondary | ICD-10-CM

## 2012-03-13 MED ORDER — CEPHALEXIN 250 MG PO CAPS
250.0000 mg | ORAL_CAPSULE | Freq: Every morning | ORAL | Status: DC
  Administered 2012-03-13 – 2012-03-14 (×2): 250 mg via ORAL
  Filled 2012-03-13 (×2): qty 1

## 2012-03-13 NOTE — Clinical Documentation Improvement (Signed)
CHANGE MENTAL STATUS DOCUMENTATION CLARIFICATION   THIS DOCUMENT IS NOT A PERMANENT PART OF THE MEDICAL RECORD  TO RESPOND TO THE THIS QUERY, FOLLOW THE INSTRUCTIONS BELOW:  1. If needed, update documentation for the patient's encounter via the notes activity.  2. Access this query again and click edit on the In [employer].  3. After updating, or not, click F2 to complete all highlighted (required) fields concerning your review. Select "additional documentation in the medical record" OR "no additional documentation provided".  4. Click Sign note button.  5. The deficiency will fall out of your In Basket *Please let us know if you are not able to complete this workflow by phone or e-mail (listed below).         03/13/12  Dear Dr. Benjamine Mola Marton Redwood  In an effort to better capture your patient's severity of illness, reflect appropriate length of stay and utilization of resources, a review of the patient medical record has revealed the following indicators.    Based on your clinical judgment, please clarify and document in a progress note and/or discharge summary the clinical condition associated with the following supporting information:  In responding to this query please exercise your independent judgment.  The fact that a query is asked, does not imply that any particular answer is desired or expected. According to ED notes, pt family reports pt has been confused x 1 week and   unable to recognize daughter. Please clarify whether or not pt's " AMS" can be further specified as one of the diagnoses listed below and document in pn or d/c summary   Possible Clinical Conditions?  _Encephalopathy (describe type if known)                                              Septic                       Hepatic                       Hypertensive                       Metabolic                        Toxic  Acute delirium Acute exacerbation of known dementia (indicate type) New diagnosis of  Dementia, Alzheimer's, cerebral atherosclerosis  _______Other Condition__________________ _______Cannot Clinically Determine   Supporting Information: Risk Factors: History  chronic UTI, Dementia, and TIA  Signs & Symptoms: 03/08/12 H&P note: " Altered Mental Status -etiology uncertain currently"                                                             " unable to properly assess due to altered mental status and limited  cooperation by patient"                                  on 03/09/12 per nsg  Notes: " pt awake all night and combative"  Lab: K 3.8 on admission K 3.2 on 03/09/12  Radiology:  CT scan 03/08/12 >IMPRESSION:  1.  No acute intracranial findings. 2.  No change from prior. 3.  Ventricular dilatation and periventricular white matter hypodensities are unchanged from prior.    Treatment:Aricept                   Neuro checks                   Safety precautions                   Delirium assessment  daily  Reviewed: additional documentation in the medical record  Thank You,  Andy Gauss RN  Clinical Documentation Specialist:  Pager [phone] E-mail [email or web address]  [employer]

## 2012-03-13 NOTE — Progress Notes (Signed)
CSW met with patient and patients daughter. CSW gave patients daughter bed offers. She is also requesting patient be faxed out to [facility]. CSW did same.  Daren Yeagle C. Namiko Pritts MSW, LCSW [phone]

## 2012-03-13 NOTE — Progress Notes (Signed)
Physical Therapy Treatment Patient Details Name: [name] MRN: [id number] DOB: 28-Nov-1929 Today's Date: 03/13/2012 Time: 7829-5621 PT Time Calculation (min): 30 min  PT Assessment / Plan / Recommendation Comments on Treatment Session  Pt OOB in recliner with daughter [name] in room.  Pt aware she was @ [facility] but couldn't recall what she had for breakfast.   Assisted pt out of recliner, incont of urine.  Assisted to [facility] for hygiene.  Amb in hallway.  Pt plans to D/C to SNF for rehab.    Follow Up Recommendations  Skilled nursing facility    Barriers to Discharge        Equipment Recommendations  Defer to next venue    Recommendations for Other Services    Frequency Min 3X/week   Plan Discharge plan remains appropriate    Precautions / Restrictions     Pertinent Vitals/Pain No c/o [ain   Mobility  Bed Mobility Details for Bed Mobility Assistance: Pt OOB in recliner Transfers Transfers: Sit to Stand;Stand to Sit Sit to Stand: 3: Mod assist;From chair/3-in-1;From toilet Stand to Sit: 3: Mod assist;To toilet;To chair/3-in-1 Details for Transfer Assistance: 75% VC's on proper tech and hand palcement Ambulation/Gait Ambulation/Gait Assistance: 3: Mod assist Ambulation Distance (Feet): 70 Feet (10' to BR then 42' in hallway) Assistive device: Rolling walker Ambulation/Gait Assistance Details: Very staggered, unsteady gait with B knees buckling and max c/o fatigue/weakness.  Chair following closely behind. Pt c/o feeling "goofy"  RA O2 92%, HR 88. Gait Pattern: Step-through pattern;Decreased stride length;Antalgic Gait velocity: decreased Stairs: No Wheelchair Mobility Wheelchair Mobility: No    PT Goals                               progressing    Visit Information  Last PT Received On: 03/13/12 Assistance Needed: +1                   End of Session PT - End of Session Equipment Utilized During Treatment: Gait belt Activity Tolerance: Patient  tolerated treatment well Patient left: in chair;with call bell/phone within reach;with family/visitor present   [name]  PTA [facility]  Acute  Rehab Pager     [phone]

## 2012-03-13 NOTE — Progress Notes (Addendum)
Initially admitted with delirium secondary to UTI- was treated partially as an outpatient by PCP.  Plan for SNF before she goes back home with daughter, awaiting bed   Subjective: Patient oriented - more awake and conversant today No c/o fever/chills   Objective: Vital signs in last 24 hours: Filed Vitals:   03/12/12 0603 03/12/12 1400 03/12/12 2140 03/13/12 0610  BP: 200/78 136/69 162/69 183/92  Pulse: 90 85 97   Temp: 98.3 F (36.8 C) 98.3 F (36.8 C) 97.5 F (36.4 C)   TempSrc: Oral Oral Oral   Resp: 18 20 19    Height:      Weight:      SpO2:  93% 94%    Weight change:   Intake/Output Summary (Last 24 hours) at 03/13/12 1610 Last data filed at 03/12/12 1300  Gross per 24 hour  Intake    240 ml  Output      0 ml  Net    240 ml    Physical Exam: General: Awake, Oriented to place/person/time, No acute distress. HEENT: EOMI. Neck: Supple CV: S1 and S2, rrr Lungs: Clear to ascultation bilaterally, no wheezing Abdomen: Soft, Nontender, Nondistended, +bowel sounds. Ext: Good pulses. Trace edema.   Lab Results: No results found for this basename: NA:2,K:2,CL:2,CO2:2,GLUCOSE:2,BUN:2,CREATININE:2,CALCIUM:2,MG:2,PHOS:2 in the last 72 hours No results found for this basename: AST:2,ALT:2,ALKPHOS:2,BILITOT:2,PROT:2,ALBUMIN:2 in the last 72 hours No results found for this basename: LIPASE:2,AMYLASE:2 in the last 72 hours No results found for this basename: WBC:2,NEUTROABS:2,HGB:2,HCT:2,MCV:2,PLT:2 in the last 72 hours No results found for this basename: CKTOTAL:3,CKMB:3,CKMBINDEX:3,TROPONINI:3 in the last 72 hours No components found with this basename: POCBNP:3 No results found for this basename: DDIMER:2 in the last 72 hours No results found for this basename: HGBA1C:2 in the last 72 hours No results found for this basename: CHOL:2,HDL:2,LDLCALC:2,TRIG:2,CHOLHDL:2,LDLDIRECT:2 in the last 72 hours No results found for this basename: TSH,T4TOTAL,FREET3,T3FREE,THYROIDAB in  the last 72 hours No results found for this basename: VITAMINB12:2,FOLATE:2,FERRITIN:2,TIBC:2,IRON:2,RETICCTPCT:2 in the last 72 hours  Micro Results: Recent Results (from the past 240 hour(s))  URINE CULTURE     Status: Normal   Collection Time   03/08/12  4:30 PM      Component Value Range Status Comment   Specimen Description URINE, CATHETERIZED   Final    Special Requests NONE   Final    Culture  Setup Time [id number]   Final    Colony Count NO GROWTH   Final    Culture NO GROWTH   Final    Report Status 03/09/2012 FINAL   Final     Studies/Results: No results found.  Medications: I have reviewed the patient's current medications. Scheduled Meds:    . cephALEXin  250 mg Oral q morning - 10a  . dipyridamole-aspirin  1 capsule Oral BID  . donepezil  10 mg Oral QHS  . feeding supplement  237 mL Oral BID BM  . levothyroxine  150 mcg Oral Daily  . metoprolol tartrate  12.5 mg Oral BID  . pantoprazole  40 mg Oral Q1200  . predniSONE  10 mg Oral QODAY  . predniSONE  15 mg Oral QODAY  . sertraline  50 mg Oral Daily  . simvastatin  20 mg Oral QHS  . sodium chloride  3 mL Intravenous Q12H  . DISCONTD: cefTRIAXone (ROCEPHIN)  IV  1 g Intravenous QHS   Continuous Infusions:    . DISCONTD: sodium chloride 75 mL/hr at 03/11/12 1311   PRN Meds:.acetaminophen, acetaminophen, hydrALAZINE, ondansetron (ZOFRAN) IV, ondansetron  Assessment/Plan: Acute delirium resolved- probably secondary to UTI   Generalized weakness- PT- recommends SNF, ? UTI   Chronic UTI- rocephin treated, no growth on culture as she was partially treated as an outpatient- will place on prophylaxis as an outpatient (keflex) per family request  CKD- trend   Hypothyroidism- synthroid   Hyperlipidemia- stable   Depression- stable   Dementia- at Baseline  HTN- add low dose BB, has drop in BP when she gets up per daughter  hypokalemia- replace   Ready for D/C when bed available at [facility]- social  worker   LOS: 5 days  Joss Friedel, DO 03/13/2012, 9:38 AM

## 2012-03-14 DIAGNOSIS — R7989 Other specified abnormal findings of blood chemistry: Secondary | ICD-10-CM

## 2012-03-14 DIAGNOSIS — N3020 Other chronic cystitis without hematuria: Secondary | ICD-10-CM

## 2012-03-14 DIAGNOSIS — F0390 Unspecified dementia without behavioral disturbance: Secondary | ICD-10-CM

## 2012-03-14 DIAGNOSIS — R4182 Altered mental status, unspecified: Secondary | ICD-10-CM

## 2012-03-14 MED ORDER — ENSURE COMPLETE PO LIQD: 237.0000 mL | Freq: Two times a day (BID) | ORAL | Status: DC

## 2012-03-14 MED ORDER — CEPHALEXIN 250 MG PO CAPS: 250.0000 mg | ORAL_CAPSULE | Freq: Every morning | ORAL | Status: AC

## 2012-03-14 MED ORDER — METOPROLOL TARTRATE 12.5 MG HALF TABLET: 25.0000 mg | ORAL_TABLET | Freq: Two times a day (BID) | ORAL | Status: DC

## 2012-03-14 NOTE — Progress Notes (Signed)
Patient being discharged to [facility],report called,and given to [employer]. No c/o pain or discomfort noted.Transported via EMS.

## 2012-03-14 NOTE — Discharge Summary (Addendum)
Physician Discharge Summary  Patient ID: [name] MRN: [id number] DOB/AGE: [birth year] 78 y.o.  Admit date: 03/08/2012 Discharge date: 03/14/2012  Primary Care Physician:  Cain Saupe, MD   Discharge Diagnoses:    Patient Active Problem List  Diagnosis  . Generalized weakness  . Altered mental status  . Chronic UTI  . Hypothyroidism  . Hyperlipidemia  . Depression  . Dementia    Medication List  As of 03/14/2012 11:05 AM   STOP taking these medications         sulfamethoxazole-trimethoprim 400-80 MG per tablet         TAKE these medications         acetaminophen 325 MG tablet   Commonly known as: TYLENOL   Take 650 mg by mouth every 6 (six) hours as needed. pain      CALCIUM + D PO   Take 2 tablets by mouth 2 (two) times daily.      cephALEXin 250 MG capsule   Commonly known as: KEFLEX   Take 1 capsule (250 mg total) by mouth every morning.      cholecalciferol 1000 UNITS tablet   Commonly known as: VITAMIN D   Take 2,000 Units by mouth daily.      CRANBERRY EXTRACT PO   Take 1 capsule by mouth daily.      dipyridamole-aspirin 25-200 MG per 12 hr capsule   Commonly known as: AGGRENOX   Take 1 capsule by mouth 2 (two) times daily.      docusate sodium 100 MG capsule   Commonly known as: COLACE   Take 200 mg by mouth daily.      donepezil 10 MG tablet   Commonly known as: ARICEPT   Take 10 mg by mouth at bedtime.      feeding supplement Liqd   Take 237 mLs by mouth 2 (two) times daily between meals.      ferrous sulfate 325 (65 FE) MG tablet   Take 325 mg by mouth daily.      levothyroxine 150 MCG tablet   Commonly known as: SYNTHROID, LEVOTHROID   Take 150 mcg by mouth daily.      metoprolol tartrate 12.5 mg Tabs   Commonly known as: LOPRESSOR   Take 1 tablet (25 mg total) by mouth 2 (two) times daily.      multivitamin with minerals Tabs   Take 1 tablet by mouth daily.      omeprazole 20 MG capsule   Commonly known as: PRILOSEC   Take 20 mg by mouth daily.      polyethylene glycol packet   Commonly known as: MIRALAX / GLYCOLAX   Take 17 g by mouth daily as needed. For constipation.      predniSONE 5 MG tablet   Commonly known as: DELTASONE   Take 10-15 mg by mouth See admin instructions. Alternating daily between 10mg  and 15mg       sertraline 50 MG tablet   Commonly known as: ZOLOFT   Take 50 mg by mouth daily.      simvastatin 20 MG tablet   Commonly known as: ZOCOR   Take 20 mg by mouth at bedtime.             Disposition and Follow-up:  Follow up with primary MD, and with SNF MD.  Consults:  None.   Significant Diagnostic Studies:  Ct Head Wo Contrast  03/08/2012  *RADIOLOGY REPORT*  Clinical Data: Confusion, altered mental status  CT HEAD  WITHOUT CONTRAST  Technique:  Contiguous axial images were obtained from the base of the skull through the vertex without contrast.  Comparison: Brain MRI 09/22/2011, head CT 06/18/2010  Findings: No acute intracranial hemorrhage.  No focal mass lesion. No CT evidence of acute infarction.   No midline shift or mass effect.  No hydrocephalus.  Basilar cisterns are patent. Ventricles are large unchanged from prior.  There is extensive white matter hypodensities within the periventricular white matter.  Findings are unchanged from multiple comparison exams.  There is generalized cortical atrophy.  No CT evidence of acute cortical infarction.  Paranasal sinuses and mastoid air cells are clear.  Orbits are normal.  IMPRESSION:  1.  No acute intracranial findings. 2.  No change from prior. 3.  Ventricular dilatation and periventricular white matter hypodensities are unchanged from prior.  Original Report Authenticated By: Genevive Bi, M.D.   Dg Chest Port 1 View  03/08/2012  *RADIOLOGY REPORT*  Clinical Data: Altered mental status  PORTABLE CHEST - 1 VIEW  Comparison: 12/03/2010  Findings: Cardiomegaly again noted.  Stable chronic bronchitic changes.  No acute infiltrate  or pulmonary edema.  Stable chronic blunting of the left costophrenic angle.  Atherosclerotic calcifications of thoracic aorta.  Stable degenerative changes thoracic spine.  IMPRESSION: No active disease.  Stable chronic bronchitic changes. Are noted chronic blunting of the left costophrenic angle.  Original Report Authenticated By: Natasha Mead, M.D.    Brief H and P: For complete details, refer to admission H and P. However, in brief, this is an 78 year old female, with history of dementia, TIA's, temporal arteritis, and chronic UTI's on prophylactic antibiotics for approximately one year, presenting with weakness, confusion and decreased oral intake within the last week. She was seen by her urologist who within the previous 5 days or so took her off of her prophylactic Bactrim DS and started her on Keflex for a suspected UTI. Patient continued to have "confusion" per family and was brought into the ED for further evaluation. She was admitted for further evaluation, investigation and management.   Physical Exam: On 03/14/12.  General:   Alert, communicative, talking in complete sentences, not short of breath at rest.  HEENT:  No clinical pallor, no jaundice, no conjunctival injection or discharge. Hydration status is satisfactory. NECK:  Supple, JVP not seen, no carotid bruits, no palpable lymphadenopathy, no palpable goiter. CHEST:  Clinically clear to auscultation, no wheezes, no crackles. HEART:  Sounds 1 and 2 heard, normal, regular, no murmurs. ABDOMEN:  Obese, soft, non-tender, no palpable organomegaly, no palpable masses, normal bowel sounds. GENITALIA:  Not examined. LOWER EXTREMITIES:  No pitting edema, palpable peripheral pulses. MUSCULOSKELETAL SYSTEM:  Generalized osteoarthritic changes, otherwise, normal. CENTRAL NERVOUS SYSTEM:  No focal neurologic deficit on gross examination.    Hospital Course:  Active Problems:  1. Altered mental status: Patient presented with confusion,  attributable to acute delirium, secondary to UTI. This resolved with specific treatment, and as of 03/13/12, mental status was back to baseline.  Head CT scan was negative for acute pathology. 2. Generalized weakness: Due to UTI. This has improved. Patient was evaluated by PT, during her hospitalization, and SNF, recommended. 3. Chronic UTI: Patient has a history of recurrent/Chronic UTI, and had hitherto, been on prophylactic Bactrim DS. She had been partially treated with Keflex commenced on 03/04/12, prior to admission, and was managed with iv Rocephin, on presentation. There has been no growth on culture, patient remained afebrile, and wcc remained normal. She has  been placed on prophylactic keflex on discharge,. 2. CKD: Renal indices remained stable.  3. Hypothyroidism: Continued on replacement therapy with Synthroid.  4. Hyperlipidemia: Stable on Statin. 5. Depression/Dementia: Stable and at baseline.  6. HTN: Sub-optimally controlled. Beta-blocker added to treatment.  Comment: Stable for discharge to SNF on 03/14/12.   Time spent on Discharge: 45 mins.  Signed: Zayana Salvador,CHRISTOPHER 03/14/2012, 11:05 AM

## 2012-03-14 NOTE — Progress Notes (Signed)
Patient cleared for discharge. Packet copied and placed in [facility]. ptar called for transportation to [facility]. Daughter currently at [facility] signing patient in.  Hibba Schram C. Nery Frappier MSW, LCSW [phone]

## 2012-05-12 ENCOUNTER — Emergency Department (HOSPITAL_COMMUNITY): Payer: Medicare Other

## 2012-05-12 ENCOUNTER — Inpatient Hospital Stay (HOSPITAL_COMMUNITY)
Admission: EM | Admit: 2012-05-12 | Discharge: 2012-05-17 | DRG: 291 | Disposition: A | Discharge status: Skilled Nursing Facility | Payer: Medicare Other | Attending: Family Medicine | Admitting: Family Medicine

## 2012-05-12 ENCOUNTER — Encounter (HOSPITAL_COMMUNITY): Payer: Self-pay | Admitting: Family Medicine

## 2012-05-12 DIAGNOSIS — E8770 Fluid overload, unspecified: Secondary | ICD-10-CM | POA: Diagnosis present

## 2012-05-12 DIAGNOSIS — Z66 Do not resuscitate: Secondary | ICD-10-CM | POA: Diagnosis present

## 2012-05-12 DIAGNOSIS — E039 Hypothyroidism, unspecified: Secondary | ICD-10-CM | POA: Diagnosis present

## 2012-05-12 DIAGNOSIS — Z8719 Personal history of other diseases of the digestive system: Secondary | ICD-10-CM | POA: Insufficient documentation

## 2012-05-12 DIAGNOSIS — M316 Other giant cell arteritis: Secondary | ICD-10-CM | POA: Diagnosis present

## 2012-05-12 DIAGNOSIS — I10 Essential (primary) hypertension: Secondary | ICD-10-CM | POA: Diagnosis present

## 2012-05-12 DIAGNOSIS — R0600 Dyspnea, unspecified: Secondary | ICD-10-CM

## 2012-05-12 DIAGNOSIS — F329 Major depressive disorder, single episode, unspecified: Secondary | ICD-10-CM | POA: Diagnosis present

## 2012-05-12 DIAGNOSIS — R531 Weakness: Secondary | ICD-10-CM | POA: Diagnosis present

## 2012-05-12 DIAGNOSIS — R0902 Hypoxemia: Secondary | ICD-10-CM | POA: Diagnosis present

## 2012-05-12 DIAGNOSIS — R079 Chest pain, unspecified: Secondary | ICD-10-CM | POA: Diagnosis present

## 2012-05-12 DIAGNOSIS — G609 Hereditary and idiopathic neuropathy, unspecified: Secondary | ICD-10-CM | POA: Diagnosis present

## 2012-05-12 DIAGNOSIS — R4182 Altered mental status, unspecified: Secondary | ICD-10-CM

## 2012-05-12 DIAGNOSIS — Z8673 Personal history of transient ischemic attack (TIA), and cerebral infarction without residual deficits: Secondary | ICD-10-CM

## 2012-05-12 DIAGNOSIS — F32A Depression, unspecified: Secondary | ICD-10-CM | POA: Diagnosis present

## 2012-05-12 DIAGNOSIS — D649 Anemia, unspecified: Secondary | ICD-10-CM

## 2012-05-12 DIAGNOSIS — J189 Pneumonia, unspecified organism: Secondary | ICD-10-CM | POA: Diagnosis present

## 2012-05-12 DIAGNOSIS — E669 Obesity, unspecified: Secondary | ICD-10-CM | POA: Diagnosis present

## 2012-05-12 DIAGNOSIS — R600 Localized edema: Secondary | ICD-10-CM | POA: Diagnosis present

## 2012-05-12 DIAGNOSIS — Z888 Allergy status to other drugs, medicaments and biological substances status: Secondary | ICD-10-CM

## 2012-05-12 DIAGNOSIS — Z9889 Other specified postprocedural states: Secondary | ICD-10-CM

## 2012-05-12 DIAGNOSIS — F0390 Unspecified dementia without behavioral disturbance: Secondary | ICD-10-CM | POA: Diagnosis present

## 2012-05-12 DIAGNOSIS — E785 Hyperlipidemia, unspecified: Secondary | ICD-10-CM | POA: Diagnosis present

## 2012-05-12 DIAGNOSIS — I5033 Acute on chronic diastolic (congestive) heart failure: Secondary | ICD-10-CM

## 2012-05-12 DIAGNOSIS — G629 Polyneuropathy, unspecified: Secondary | ICD-10-CM | POA: Diagnosis present

## 2012-05-12 DIAGNOSIS — Z85038 Personal history of other malignant neoplasm of large intestine: Secondary | ICD-10-CM

## 2012-05-12 DIAGNOSIS — R1310 Dysphagia, unspecified: Secondary | ICD-10-CM | POA: Diagnosis present

## 2012-05-12 DIAGNOSIS — I110 Hypertensive heart disease with heart failure: Principal | ICD-10-CM | POA: Diagnosis present

## 2012-05-12 DIAGNOSIS — I119 Hypertensive heart disease without heart failure: Secondary | ICD-10-CM | POA: Diagnosis present

## 2012-05-12 DIAGNOSIS — G459 Transient cerebral ischemic attack, unspecified: Secondary | ICD-10-CM | POA: Insufficient documentation

## 2012-05-12 DIAGNOSIS — N390 Urinary tract infection, site not specified: Secondary | ICD-10-CM | POA: Diagnosis present

## 2012-05-12 DIAGNOSIS — Z6827 Body mass index (BMI) 27.0-27.9, adult: Secondary | ICD-10-CM

## 2012-05-12 DIAGNOSIS — R0609 Other forms of dyspnea: Secondary | ICD-10-CM

## 2012-05-12 DIAGNOSIS — R609 Edema, unspecified: Secondary | ICD-10-CM

## 2012-05-12 DIAGNOSIS — I509 Heart failure, unspecified: Secondary | ICD-10-CM | POA: Diagnosis present

## 2012-05-12 DIAGNOSIS — C189 Malignant neoplasm of colon, unspecified: Secondary | ICD-10-CM | POA: Insufficient documentation

## 2012-05-12 DIAGNOSIS — IMO0002 Reserved for concepts with insufficient information to code with codable children: Secondary | ICD-10-CM

## 2012-05-12 DIAGNOSIS — Z9981 Dependence on supplemental oxygen: Secondary | ICD-10-CM

## 2012-05-12 DIAGNOSIS — Z79899 Other long term (current) drug therapy: Secondary | ICD-10-CM

## 2012-05-12 DIAGNOSIS — I5030 Unspecified diastolic (congestive) heart failure: Secondary | ICD-10-CM | POA: Diagnosis present

## 2012-05-12 DIAGNOSIS — Z881 Allergy status to other antibiotic agents status: Secondary | ICD-10-CM

## 2012-05-12 DIAGNOSIS — N179 Acute kidney failure, unspecified: Secondary | ICD-10-CM

## 2012-05-12 DIAGNOSIS — D6489 Other specified anemias: Secondary | ICD-10-CM | POA: Diagnosis present

## 2012-05-12 DIAGNOSIS — J9600 Acute respiratory failure, unspecified whether with hypoxia or hypercapnia: Secondary | ICD-10-CM

## 2012-05-12 DIAGNOSIS — F3289 Other specified depressive episodes: Secondary | ICD-10-CM | POA: Diagnosis present

## 2012-05-12 HISTORY — DX: Unspecified diastolic (congestive) heart failure: I50.30

## 2012-05-12 HISTORY — DX: Cardiomyopathy in diseases classified elsewhere: I43

## 2012-05-12 HISTORY — DX: Heart failure, unspecified: I50.9

## 2012-05-12 HISTORY — DX: Urinary tract infection, site not specified: N39.0

## 2012-05-12 HISTORY — DX: Hyperlipidemia, unspecified: E78.5

## 2012-05-12 HISTORY — DX: Shortness of breath: R06.02

## 2012-05-12 HISTORY — DX: Unspecified dementia, unspecified severity, without behavioral disturbance, psychotic disturbance, mood disturbance, and anxiety: F03.90

## 2012-05-12 HISTORY — DX: Dependence on supplemental oxygen: Z99.81

## 2012-05-12 HISTORY — DX: Anemia, unspecified: D64.9

## 2012-05-12 HISTORY — DX: Malignant neoplasm of colon, unspecified: C18.9

## 2012-05-12 HISTORY — DX: Hypertensive heart disease without heart failure: I11.9

## 2012-05-12 HISTORY — DX: Reserved for concepts with insufficient information to code with codable children: IMO0002

## 2012-05-12 LAB — LACTIC ACID, PLASMA: Lactic Acid, Venous: 1.7 mmol/L (ref 0.5–2.2)

## 2012-05-12 LAB — CARDIAC PANEL(CRET KIN+CKTOT+MB+TROPI)
CK, MB: 1.8 ng/mL (ref 0.3–4.0)
Relative Index: INVALID (ref 0.0–2.5)
Total CK: 26 U/L (ref 7–177)
Troponin I: <0.3 ng/mL

## 2012-05-12 LAB — CBC WITH DIFFERENTIAL/PLATELET
Basophils Relative: 0 % (ref 0–1)
Eosinophils Absolute: 0 10*3/uL (ref 0.0–0.7)
Eosinophils Relative: 0 % (ref 0–5)
MCH: 30.7 pg (ref 26.0–34.0)
MCHC: 32.1 g/dL (ref 30.0–36.0)
MCV: 95.7 fL (ref 78.0–100.0)
Monocytes Relative: 2 % — ABNORMAL LOW (ref 3–12)
Neutrophils Relative %: 88 % — ABNORMAL HIGH (ref 43–77)
Platelets: 291 10*3/uL (ref 150–400)

## 2012-05-12 LAB — URINALYSIS, ROUTINE W REFLEX MICROSCOPIC
Glucose, UA: NEGATIVE mg/dL
Ketones, ur: NEGATIVE mg/dL
Leukocytes, UA: NEGATIVE
Nitrite: NEGATIVE
Specific Gravity, Urine: 1.009 (ref 1.005–1.030)
pH: 6.5 (ref 5.0–8.0)

## 2012-05-12 LAB — COMPREHENSIVE METABOLIC PANEL
Albumin: 3 g/dL — ABNORMAL LOW (ref 3.5–5.2)
Alkaline Phosphatase: 72 U/L (ref 39–117)
BUN: 13 mg/dL (ref 6–23)
Calcium: 9.1 mg/dL (ref 8.4–10.5)
GFR calc Af Amer: 63 mL/min — ABNORMAL LOW (ref >90)
Potassium: 3.4 mEq/L — ABNORMAL LOW (ref 3.5–5.1)
Sodium: 138 mEq/L (ref 135–145)
Total Protein: 7.4 g/dL (ref 6.0–8.3)

## 2012-05-12 LAB — PRO B NATRIURETIC PEPTIDE: Pro B Natriuretic peptide (BNP): 1758 pg/mL — ABNORMAL HIGH (ref 0–450)

## 2012-05-12 LAB — STREP PNEUMONIAE URINARY ANTIGEN: Strep Pneumo Urinary Antigen: NEGATIVE

## 2012-05-12 MED ORDER — SERTRALINE HCL 50 MG PO TABS
50.0000 mg | ORAL_TABLET | Freq: Every day | ORAL | Status: DC
  Administered 2012-05-13 – 2012-05-17 (×5): 50 mg via ORAL
  Filled 2012-05-12 (×5): qty 1

## 2012-05-12 MED ORDER — METHYLPREDNISOLONE SODIUM SUCC 40 MG IJ SOLR
20.0000 mg | Freq: Two times a day (BID) | INTRAMUSCULAR | Status: DC
  Administered 2012-05-12 – 2012-05-13 (×2): 20 mg via INTRAVENOUS
  Filled 2012-05-12 (×4): qty 0.5

## 2012-05-12 MED ORDER — HEPARIN SODIUM (PORCINE) 5000 UNIT/ML IJ SOLN: 5000.0000 [IU] | Freq: Three times a day (TID) | INTRAMUSCULAR | Status: DC

## 2012-05-12 MED ORDER — POTASSIUM CHLORIDE CRYS ER 20 MEQ PO TBCR
30.0000 meq | EXTENDED_RELEASE_TABLET | Freq: Two times a day (BID) | ORAL | Status: DC
  Administered 2012-05-13 – 2012-05-14 (×3): 30 meq via ORAL
  Filled 2012-05-12 (×4): qty 1

## 2012-05-12 MED ORDER — VITAMIN D3 25 MCG (1000 UNIT) PO TABS
2000.0000 [IU] | ORAL_TABLET | Freq: Every day | ORAL | Status: DC
  Administered 2012-05-13 – 2012-05-17 (×5): 2000 [IU] via ORAL
  Filled 2012-05-12 (×5): qty 2

## 2012-05-12 MED ORDER — SIMVASTATIN 20 MG PO TABS
20.0000 mg | ORAL_TABLET | Freq: Every day | ORAL | Status: DC
  Administered 2012-05-12 – 2012-05-16 (×5): 20 mg via ORAL
  Filled 2012-05-12 (×7): qty 1

## 2012-05-12 MED ORDER — VANCOMYCIN HCL IN DEXTROSE 1-5 GM/200ML-% IV SOLN
1000.0000 mg | Freq: Once | INTRAVENOUS | Status: DC
  Administered 2012-05-12: 1000 mg via INTRAVENOUS
  Filled 2012-05-12: qty 200

## 2012-05-12 MED ORDER — SODIUM CHLORIDE 0.9 % IJ SOLN: 3.0000 mL | INTRAMUSCULAR | Status: DC | PRN

## 2012-05-12 MED ORDER — POLYETHYLENE GLYCOL 3350 17 G PO PACK
17.0000 g | PACK | Freq: Every day | ORAL | Status: DC | PRN
  Filled 2012-05-12: qty 1

## 2012-05-12 MED ORDER — ONDANSETRON HCL 4 MG/2ML IJ SOLN
4.0000 mg | Freq: Four times a day (QID) | INTRAMUSCULAR | Status: DC | PRN
  Administered 2012-05-12 – 2012-05-14 (×2): 4 mg via INTRAVENOUS
  Filled 2012-05-12 (×2): qty 2

## 2012-05-12 MED ORDER — ENSURE COMPLETE PO LIQD
237.0000 mL | Freq: Two times a day (BID) | ORAL | Status: DC
  Administered 2012-05-13 – 2012-05-17 (×8): 237 mL via ORAL

## 2012-05-12 MED ORDER — ACETAMINOPHEN 325 MG PO TABS
650.0000 mg | ORAL_TABLET | ORAL | Status: DC | PRN
  Administered 2012-05-12: 650 mg via ORAL
  Filled 2012-05-12: qty 2

## 2012-05-12 MED ORDER — PANTOPRAZOLE SODIUM 40 MG PO TBEC
40.0000 mg | DELAYED_RELEASE_TABLET | Freq: Every day | ORAL | Status: DC
  Administered 2012-05-13 – 2012-05-17 (×5): 40 mg via ORAL
  Filled 2012-05-12 (×6): qty 1

## 2012-05-12 MED ORDER — VANCOMYCIN HCL 1000 MG IV SOLR
750.0000 mg | Freq: Two times a day (BID) | INTRAVENOUS | Status: DC
  Administered 2012-05-13 – 2012-05-14 (×3): 750 mg via INTRAVENOUS
  Filled 2012-05-12 (×5): qty 750

## 2012-05-12 MED ORDER — HEPARIN SODIUM (PORCINE) 5000 UNIT/ML IJ SOLN
5000.0000 [IU] | Freq: Three times a day (TID) | INTRAMUSCULAR | Status: DC
  Administered 2012-05-12 – 2012-05-17 (×13): 5000 [IU] via SUBCUTANEOUS
  Filled 2012-05-12 (×17): qty 1

## 2012-05-12 MED ORDER — ASPIRIN-DIPYRIDAMOLE ER 25-200 MG PO CP12
1.0000 | ORAL_CAPSULE | Freq: Two times a day (BID) | ORAL | Status: DC
  Administered 2012-05-12 – 2012-05-17 (×10): 1 via ORAL
  Filled 2012-05-12 (×13): qty 1

## 2012-05-12 MED ORDER — SODIUM CHLORIDE 0.9 % IV SOLN: 250.0000 mL | INTRAVENOUS | Status: DC | PRN

## 2012-05-12 MED ORDER — POTASSIUM CHLORIDE CRYS ER 20 MEQ PO TBCR
30.0000 meq | EXTENDED_RELEASE_TABLET | Freq: Once | ORAL | Status: AC
  Administered 2012-05-12: 30 meq via ORAL
  Filled 2012-05-12 (×2): qty 2

## 2012-05-12 MED ORDER — FUROSEMIDE 10 MG/ML IJ SOLN
60.0000 mg | Freq: Every day | INTRAMUSCULAR | Status: DC
  Filled 2012-05-12: qty 6

## 2012-05-12 MED ORDER — LEVOFLOXACIN IN D5W 750 MG/150ML IV SOLN
750.0000 mg | INTRAVENOUS | Status: DC
  Administered 2012-05-12 – 2012-05-13 (×2): 750 mg via INTRAVENOUS
  Filled 2012-05-12 (×3): qty 150

## 2012-05-12 MED ORDER — LEVOTHYROXINE SODIUM 150 MCG PO TABS
150.0000 ug | ORAL_TABLET | Freq: Every day | ORAL | Status: DC
  Administered 2012-05-13 – 2012-05-17 (×5): 150 ug via ORAL
  Filled 2012-05-12 (×8): qty 1

## 2012-05-12 MED ORDER — SODIUM CHLORIDE 0.9 % IJ SOLN
3.0000 mL | Freq: Two times a day (BID) | INTRAMUSCULAR | Status: DC
  Administered 2012-05-12 – 2012-05-17 (×10): 3 mL via INTRAVENOUS

## 2012-05-12 MED ORDER — DOCUSATE SODIUM 100 MG PO CAPS
200.0000 mg | ORAL_CAPSULE | Freq: Every day | ORAL | Status: DC
  Administered 2012-05-13 – 2012-05-16 (×3): 200 mg via ORAL
  Filled 2012-05-12 (×5): qty 2

## 2012-05-12 MED ORDER — FUROSEMIDE 10 MG/ML IJ SOLN
60.0000 mg | Freq: Once | INTRAMUSCULAR | Status: AC
  Administered 2012-05-12: 60 mg via INTRAVENOUS
  Filled 2012-05-12: qty 2

## 2012-05-12 MED ORDER — ADULT MULTIVITAMIN W/MINERALS CH
1.0000 | ORAL_TABLET | Freq: Every day | ORAL | Status: DC
  Administered 2012-05-13 – 2012-05-17 (×5): 1 via ORAL
  Filled 2012-05-12 (×5): qty 1

## 2012-05-12 MED ORDER — PIPERACILLIN-TAZOBACTAM 3.375 G IVPB
3.3750 g | Freq: Once | INTRAVENOUS | Status: AC
  Administered 2012-05-12: 3.375 g via INTRAVENOUS
  Filled 2012-05-12: qty 50

## 2012-05-12 MED ORDER — SODIUM CHLORIDE 0.9 % IV SOLN
INTRAVENOUS | Status: DC
  Administered 2012-05-12: 14:00:00 via INTRAVENOUS

## 2012-05-12 MED ORDER — METOPROLOL TARTRATE 25 MG PO TABS
25.0000 mg | ORAL_TABLET | Freq: Two times a day (BID) | ORAL | Status: DC
  Administered 2012-05-12 – 2012-05-17 (×10): 25 mg via ORAL
  Filled 2012-05-12 (×11): qty 1

## 2012-05-12 MED ORDER — SODIUM CHLORIDE 0.9 % IJ SOLN: 3.0000 mL | Freq: Two times a day (BID) | INTRAMUSCULAR | Status: DC

## 2012-05-12 MED ORDER — RAMIPRIL 2.5 MG PO CAPS
2.5000 mg | ORAL_CAPSULE | Freq: Every day | ORAL | Status: DC
  Administered 2012-05-13 – 2012-05-14 (×2): 2.5 mg via ORAL
  Filled 2012-05-12 (×2): qty 1

## 2012-05-12 MED ORDER — DONEPEZIL HCL 10 MG PO TABS
10.0000 mg | ORAL_TABLET | Freq: Every day | ORAL | Status: DC
  Administered 2012-05-12 – 2012-05-16 (×5): 10 mg via ORAL
  Filled 2012-05-12 (×6): qty 1

## 2012-05-12 MED ORDER — DEXTROSE 5 % IV SOLN
1.0000 g | Freq: Two times a day (BID) | INTRAVENOUS | Status: DC
  Administered 2012-05-12 – 2012-05-13 (×3): 1 g via INTRAVENOUS
  Filled 2012-05-12 (×5): qty 1

## 2012-05-12 MED ORDER — SODIUM CHLORIDE 0.9 % IV SOLN
INTRAVENOUS | Status: AC
  Administered 2012-05-12: 18:00:00 via INTRAVENOUS

## 2012-05-12 MED ORDER — FERROUS SULFATE 325 (65 FE) MG PO TABS
325.0000 mg | ORAL_TABLET | Freq: Every day | ORAL | Status: DC
  Administered 2012-05-13 – 2012-05-17 (×5): 325 mg via ORAL
  Filled 2012-05-12 (×6): qty 1

## 2012-05-12 NOTE — ED Notes (Signed)
pcp wants pt. To be eval. For upper lobe pna. Febrile. Bilateral leg swelling. Inc. Respiratory distress enroute. Home o2 2l 78%, and 4l [place] sao2 96. From [facility].

## 2012-05-12 NOTE — ED Provider Notes (Signed)
History     CSN: [id number]  Arrival date & time 05/12/12  1218   First MD Initiated Contact with Patient 05/12/12 1225      Chief Complaint  Patient presents with  . Respiratory Distress  . Leg Swelling    (Consider location/radiation/quality/duration/timing/severity/associated sxs/prior treatment) The history is provided by the patient. The history is limited by the condition of the patient.   patient here complaining of weakness and shortness of breath. She denies vomiting or diarrhea. No abdominal pain. No chest discomfort. Does note increased bilateral lower extremity edema. Was sent here for evaluation of prior diagnosed pneumonia from nursing home. According to records she is not taking antibiotics at this time  Past Medical History  Diagnosis Date  . Peripheral neuropathy   . Hypertension   . TIA (transient ischemic attack)   . Elevated sed rate   . Temporal arteritis   . Hypothyroidism   . Cancer     colon  . History of hernia repair   . S/P rotator cuff surgery     right    Past Surgical History  Procedure Date  . Tonsillectomy   . Appendectomy   . Abdominal hysterectomy   . Colectomy     No family history on file.  History  Substance Use Topics  . Smoking status: Never Smoker   . Smokeless tobacco: Not on file  . Alcohol Use: No    OB History    Grav Para Term Preterm Abortions TAB SAB Ect Mult Living                  Review of Systems  Unable to perform ROS   Allergies  Ciprofloxacin; Codeine; Demerol; Morphine and related; Nitrofuran derivatives; Pregabalin; Septra; and Zyrtec  Home Medications   Current Outpatient Rx  Name Route Sig Dispense Refill  . ACETAMINOPHEN 325 MG PO TABS Oral Take 650 mg by mouth every 6 (six) hours as needed. pain    . CALCIUM + D PO Oral Take 2 tablets by mouth 2 (two) times daily.    Marland Kitchen VITAMIN D 1000 UNITS PO TABS Oral Take 2,000 Units by mouth daily.    Marland Kitchen CRANBERRY EXTRACT PO Oral Take 1 capsule by  mouth daily.    . ASPIRIN-DIPYRIDAMOLE ER 25-200 MG PO CP12 Oral Take 1 capsule by mouth 2 (two) times daily.      Marland Kitchen DOCUSATE SODIUM 100 MG PO CAPS Oral Take 200 mg by mouth daily.     . DONEPEZIL HCL 10 MG PO TABS Oral Take 10 mg by mouth at bedtime.     Nolon Nations COMPLETE PO LIQD Oral Take 237 mLs by mouth 2 (two) times daily between meals. 60 Bottle 0  . FERROUS SULFATE 325 (65 FE) MG PO TABS Oral Take 325 mg by mouth daily.     Marland Kitchen LEVOTHYROXINE SODIUM 150 MCG PO TABS Oral Take 150 mcg by mouth daily.      Marland Kitchen METOPROLOL TARTRATE 12.5 MG HALF TABLET Oral Take 1 tablet (25 mg total) by mouth 2 (two) times daily. 60 tablet 0  . ADULT MULTIVITAMIN W/MINERALS CH Oral Take 1 tablet by mouth daily.      Marland Kitchen OMEPRAZOLE 20 MG PO CPDR Oral Take 20 mg by mouth daily.      Marland Kitchen POLYETHYLENE GLYCOL 3350 PO PACK Oral Take 17 g by mouth daily as needed. For constipation.    Marland Kitchen PREDNISONE 5 MG PO TABS Oral Take 10-15 mg by mouth See  admin instructions. Alternating daily between 10mg  and 15mg     . SERTRALINE HCL 50 MG PO TABS Oral Take 50 mg by mouth daily.      Marland Kitchen SIMVASTATIN 20 MG PO TABS Oral Take 20 mg by mouth at bedtime.        BP 152/60  Temp 98.1 F (36.7 C) (Oral)  Resp 18  SpO2 100%  Physical Exam  Nursing note and vitals reviewed. Constitutional: She appears lethargic. She appears toxic. She has a sickly appearance. She appears ill. She appears distressed.  HENT:  Head: Normocephalic and atraumatic.  Eyes: Conjunctivae, EOM and lids are normal. Pupils are equal, round, and reactive to light.  Neck: Normal range of motion. Neck supple. No tracheal deviation present. No mass present.  Cardiovascular: Normal rate, regular rhythm and normal heart sounds.  Exam reveals no gallop.   No murmur heard. Pulmonary/Chest: Effort normal. No stridor. No respiratory distress. She has decreased breath sounds. She has no wheezes. She has rhonchi. She has no rales.  Abdominal: Soft. Normal appearance and bowel  sounds are normal. She exhibits no distension. There is no tenderness. There is no rebound and no CVA tenderness.  Musculoskeletal: Normal range of motion. She exhibits no edema and no tenderness.       Bilateral lower extremity edema with slight erythema  Neurological: She appears lethargic. No cranial nerve deficit or sensory deficit. GCS eye subscore is 4. GCS verbal subscore is 5. GCS motor subscore is 6.  Skin: Skin is warm and dry. No abrasion and no rash noted.  Psychiatric: Her behavior is normal. Her affect is blunt. Her speech is delayed.    ED Course  Procedures (including critical care time)   Labs Reviewed  CBC WITH DIFFERENTIAL  COMPREHENSIVE METABOLIC PANEL  LACTIC ACID, PLASMA  CULTURE, BLOOD (ROUTINE X 2)  CULTURE, BLOOD (ROUTINE X 2)  URINALYSIS, ROUTINE W REFLEX MICROSCOPIC  URINE CULTURE   No results found.   No diagnosis found.    MDM   Date: 05/12/2012  Rate: 68  Rhythm: normal sinus rhythm  QRS Axis: normal  Intervals: normal  ST/T Wave abnormalities: normal  Conduction Disutrbances:incomplete rbb  Narrative Interpretation:   Old EKG Reviewed: unchanged   3:14 PM Pt to be placed on antibiotics for suspected pneumonia. Spoke with hospitalist and they will admit      Toy Baker, MD 05/12/12 1514

## 2012-05-12 NOTE — ED Notes (Signed)
Pt knows that urine is needed 

## 2012-05-12 NOTE — H&P (Signed)
History and Physical Examination  Date: 05/12/2012  Patient name: [name] Medical record number: [id number] Date of birth: September 04, 1930 Age: 78 y.o. Gender: female PCP: FULP, CAMMIE, MD  [name] is a resident of [facility]  Historian:  Patient and ER Physician, Old Records ([name] is poor historian)  Chief Complaint:  Chief Complaint  Patient presents with  . Respiratory Distress  . Leg Swelling    History of Present Illness: [name] is an 78 y.o. female resident of nursing home with dementia, chronic diastolic CHF was sent to ER by primary care provider with complaints of persistent shortness of breath requiring additional supplemental oxygen.  [name] had an ECHO in 2011 significant for diastolic CHF and LVH.  The patient normally uses 2 L per minute of oxygen and had required additional oxygen support.  Apparently had been diagnosed with pneumonia as an outpatient and started on treatment with no significant improvement.  The patient is complaining of chest pain and shortness of breath.  The patient has had increasing edema in the lower extremities.  The patient also has become pale and weak.  She was seen in the emergency department and evaluated and found to be volume overloaded and her chest x-ray was suspicious for pneumonia and fluid overload.  Her cardiac BNP was near 2000.  Hospitalization was requested for further evaluation and management.  Past Medical History Past Medical History  Diagnosis Date  . Peripheral neuropathy   . Hypertension   . TIA (transient ischemic attack)   . Elevated sed rate   . Temporal arteritis   . Hypothyroidism   . Colon cancer     colon  . History of hernia repair   . S/P rotator cuff surgery     right  . Diastolic CHF   . Cardiomegaly - hypertensive   . Chronic anemia   . Chronic steroid use     Past Surgical History Past Surgical History  Procedure Date  . Tonsillectomy   . Appendectomy   . Abdominal  hysterectomy   . Colectomy     Home Meds: Prior to Admission medications   Medication Sig Start Date End Date Taking? Authorizing Provider  acetaminophen (TYLENOL) 325 MG tablet Take 650 mg by mouth every 6 (six) hours as needed. For pain   Yes Historical Provider, MD  Calcium Carbonate-Vitamin D (CALCIUM + D PO) Take 2 tablets by mouth 2 (two) times daily.   Yes Historical Provider, MD  cholecalciferol (VITAMIN D) 1000 UNITS tablet Take 2,000 Units by mouth daily.   Yes Historical Provider, MD  CRANBERRY EXTRACT PO Take 1 capsule by mouth daily.   Yes Historical Provider, MD  dipyridamole-aspirin (AGGRENOX) 25-200 MG per 12 hr capsule Take 1 capsule by mouth 2 (two) times daily.     Yes Historical Provider, MD  docusate sodium (COLACE) 100 MG capsule Take 200 mg by mouth daily.    Yes Historical Provider, MD  donepezil (ARICEPT) 10 MG tablet Take 10 mg by mouth at bedtime.    Yes Historical Provider, MD  feeding supplement (ENSURE COMPLETE) LIQD Take 237 mLs by mouth 2 (two) times daily between meals. 03/14/12  Yes Laveda Norman, MD  ferrous sulfate 325 (65 FE) MG tablet Take 325 mg by mouth daily.    Yes Historical Provider, MD  levothyroxine (SYNTHROID, LEVOTHROID) 150 MCG tablet Take 150 mcg by mouth daily.     Yes Historical Provider, MD  metoprolol tartrate (LOPRESSOR) 12.5 mg TABS Take  1 tablet (25 mg total) by mouth 2 (two) times daily. 03/14/12  Yes Laveda Norman, MD  Multiple Vitamin (MULITIVITAMIN WITH MINERALS) TABS Take 1 tablet by mouth daily.     Yes Historical Provider, MD  omeprazole (PRILOSEC) 20 MG capsule Take 20 mg by mouth daily.     Yes Historical Provider, MD  polyethylene glycol (MIRALAX / GLYCOLAX) packet Take 17 g by mouth daily as needed. For constipation.   Yes Historical Provider, MD  predniSONE (DELTASONE) 5 MG tablet Take 10-15 mg by mouth See admin instructions. Alternating daily between 10mg  and 15mg    Yes Historical Provider, MD  sertraline (ZOLOFT) 50 MG tablet  Take 50 mg by mouth daily.     Yes Historical Provider, MD  simvastatin (ZOCOR) 20 MG tablet Take 20 mg by mouth at bedtime.     Yes Historical Provider, MD    Allergies: Ciprofloxacin; Codeine; Demerol; Morphine and related; Nitrofuran derivatives; Pregabalin; Septra; and Zyrtec  Social History:  History   Social History  . Marital Status: Widowed    Spouse Name: N/A    Number of Children: N/A  . Years of Education: N/A   Occupational History  . Not on file.   Social History Main Topics  . Smoking status: Never Smoker   . Smokeless tobacco: Not on file  . Alcohol Use: No  . Drug Use: No  . Sexually Active:    Other Topics Concern  . Not on file   Social History Narrative  . No narrative on file   Family History: No family history on file.  Review of Systems: Pertinent items are noted in HPI. All other systems reviewed and reported as negative.   Physical Exam: Blood pressure 169/57, pulse 73, temperature 100.6 F (38.1 C), temperature source Core (Comment), resp. rate 21, SpO2 97.00%. General appearance: alert, cooperative, appears stated age, fatigued, mild distress and pale Head: Normocephalic, without obvious abnormality, atraumatic Eyes: negative Nose: Nares normal. Septum midline. Mucosa normal. No drainage or sinus tenderness., no discharge Throat: normal findings: lips normal without lesions, gums healthy and oropharynx pink & moist without lesions or evidence of thrush Neck: JVD - 2 cm above sternal notch, no adenopathy, no carotid bruit, supple, symmetrical, trachea midline and thyroid not enlarged, symmetric, no tenderness/mass/nodules Lungs: rhonchi bilaterally Heart: regular rate and rhythm, S1, S2 normal, no murmur, click, rub or gallop Abdomen: soft, non-tender; bowel sounds normal; no masses,  no organomegaly Extremities: edema 2+ bilateral LE edema Pulses: 2+ and symmetric Neurologic: Grossly normal  Lab  And Imaging results:  Results for orders  placed during the hospital encounter of 05/12/12 (from the past 24 hour(s))  CBC WITH DIFFERENTIAL     Status: Abnormal   Collection Time   05/12/12 12:32 PM      Component Value Range   WBC 8.2  4.0 - 10.5 K/uL   RBC 3.29 (*) 3.87 - 5.11 MIL/uL   Hemoglobin 10.1 (*) 12.0 - 15.0 g/dL   HCT 40.9 (*) 81.1 - 91.4 %   MCV 95.7  78.0 - 100.0 fL   MCH 30.7  26.0 - 34.0 pg   MCHC 32.1  30.0 - 36.0 g/dL   RDW 78.2  95.6 - 21.3 %   Platelets 291  150 - 400 K/uL   Neutrophils Relative 88 (*) 43 - 77 %   Neutro Abs 7.2  1.7 - 7.7 K/uL   Lymphocytes Relative 10 (*) 12 - 46 %   Lymphs Abs 0.8  0.7 - 4.0 K/uL   Monocytes Relative 2 (*) 3 - 12 %   Monocytes Absolute 0.2  0.1 - 1.0 K/uL   Eosinophils Relative 0  0 - 5 %   Eosinophils Absolute 0.0  0.0 - 0.7 K/uL   Basophils Relative 0  0 - 1 %   Basophils Absolute 0.0  0.0 - 0.1 K/uL  COMPREHENSIVE METABOLIC PANEL     Status: Abnormal   Collection Time   05/12/12 12:32 PM      Component Value Range   Sodium 138  135 - 145 mEq/L   Potassium 3.4 (*) 3.5 - 5.1 mEq/L   Chloride 96  96 - 112 mEq/L   CO2 30  19 - 32 mEq/L   Glucose, Bld 108 (*) 70 - 99 mg/dL   BUN 13  6 - 23 mg/dL   Creatinine, Ser 8.29  0.50 - 1.10 mg/dL   Calcium 9.1  8.4 - 56.2 mg/dL   Total Protein 7.4  6.0 - 8.3 g/dL   Albumin 3.0 (*) 3.5 - 5.2 g/dL   AST 20  0 - 37 U/L   ALT 13  0 - 35 U/L   Alkaline Phosphatase 72  39 - 117 U/L   Total Bilirubin 0.3  0.3 - 1.2 mg/dL   GFR calc non Af Amer 54 (*) >90 mL/min   GFR calc Af Amer 63 (*) >90 mL/min  LACTIC ACID, PLASMA     Status: Normal   Collection Time   05/12/12 12:49 PM      Component Value Range   Lactic Acid, Venous 1.7  0.5 - 2.2 mmol/L  URINALYSIS, ROUTINE W REFLEX MICROSCOPIC     Status: Normal   Collection Time   05/12/12  1:30 PM      Component Value Range   Color, Urine YELLOW  YELLOW   APPearance CLEAR  CLEAR   Specific Gravity, Urine 1.009  1.005 - 1.030   pH 6.5  5.0 - 8.0   Glucose, UA NEGATIVE   NEGATIVE mg/dL   Hgb urine dipstick NEGATIVE  NEGATIVE   Bilirubin Urine NEGATIVE  NEGATIVE   Ketones, ur NEGATIVE  NEGATIVE mg/dL   Protein, ur NEGATIVE  NEGATIVE mg/dL   Urobilinogen, UA 0.2  0.0 - 1.0 mg/dL   Nitrite NEGATIVE  NEGATIVE   Leukocytes, UA NEGATIVE  NEGATIVE  PRO B NATRIURETIC PEPTIDE     Status: Abnormal   Collection Time   05/12/12  2:56 PM      Component Value Range   Pro B Natriuretic peptide (BNP) 1758.0 (*) 0 - 450 pg/mL     Impression   *Chest pain  Generalized weakness  Chronic UTI  Hypothyroidism  Hyperlipidemia  Depression  Dementia  Chronic steroid use  Chronic anemia  Cardiomegaly - hypertensive  Diastolic CHF  S/P rotator cuff surgery  Temporal arteritis  Hypertension  Peripheral neuropathy  Acute exacerbation of CHF (congestive heart failure)  Volume overload  Bilateral leg edema  Hypoxia  Dependence on supplemental oxygen    Plan  Acute exacerbation of chronic diastolic heart failure -Admit to telemetry under the CHF protocol, initiate diuresis, monitor electrolytes, replace potassium, monitor ins and outs and daily weights, check echocardiogram, cycle cardiac enzymes to rule out myocardial ischemia - follow BNP and clinical course  Possible healthcare associated pneumonia -The patient has been empirically started on vancomycin and Zosyn -Repeat chest x-ray in a.m. after diuresis  Volume overload -Diuresis ordered as above  Hypoxia -Continue supplemental  oxygen support  Hypertension - Resume home medications  Hypothyroidism - Check TSH -Resume home thyroid supplement  Generalized weakness -Consult [name] and occupational therapy for evaluation and management recommendations  Dementia  Anemia-chronic - Monitor closely with serial hemoglobin tests  Please see orders and follow hospital course.   Standley Dakins MD [facility] [place], [place] [phone] 05/12/2012, 3:39 PM

## 2012-05-12 NOTE — Progress Notes (Signed)
ANTIBIOTIC CONSULT NOTE - INITIAL  Pharmacy Consult for Vancomycin, Levofloxacin, and Cefepime Indication: pneumonia (HCAP)  Allergies  Allergen Reactions  . Ciprofloxacin Nausea And Vomiting  . Codeine Other (See Comments)    halucinations  . Demerol Other (See Comments)    halucinations  . Morphine And Related Other (See Comments)    halucinations  . Nitrofuran Derivatives Other (See Comments)    unknown  . Pregabalin Other (See Comments)    unknown  . Septra (Bactrim) Other (See Comments)    Unknown cant tolerate DS  . Zyrtec (Cetirizine Hcl) Other (See Comments)    Makes loopy    Patient Measurements: Height: 5\' 10"  (177.8 cm) Weight: 205 lb 7.5 oz (93.2 kg) IBW/kg (Calculated) : 68.5   Vital Signs: Temp: 99.6 F (37.6 C) (08/17 1659) Temp src: Oral (08/17 1659) BP: 164/65 mmHg (08/17 1659) Pulse Rate: 67  (08/17 1630) Intake/Output from previous day:   Intake/Output from this shift: Total I/O In: -  Out: 1300 [Urine:1300]  Labs:  [facility] 05/12/12 1232  WBC 8.2  HGB 10.1*  PLT 291  LABCREA --  CREATININE 0.96   Estimated Creatinine Clearance: 57.8 ml/min (by C-G formula based on Cr of 0.96).  Microbiology: No results found for this or any previous visit (from the past 720 hour(s)).  Medical History: Past Medical History  Diagnosis Date  . Peripheral neuropathy   . Hypertension   . TIA (transient ischemic attack)   . Elevated sed rate   . Temporal arteritis   . Hypothyroidism   . Colon cancer     colon  . History of hernia repair   . S/P rotator cuff surgery     right  . Diastolic CHF   . Cardiomegaly - hypertensive   . Chronic anemia   . Chronic steroid use   . Dementia   . Shortness of breath     Medications:   Pt received Zosyn 3.375 gm and Vancomycin 1gm IV x 1 dose in the ED ~ 1600 today   Assessment: 78 yo F admitted 05/12/2012 from a nursing home with complaints of persistent SOB.  The patient normally uses 2L [place] of O2 and  has required additional O2 support.  Pt was recently diagnosed with PNA and started on oral antibiotics without improvment. Pt also noted to be volume overloaded on x-ray and edema in LEs.  Noted SCr 0.96 and CrCl ~ 60 ml/min.  Goal of Therapy:  Vancomycin trough level 15-20 mcg/ml Renal dose adjustment of other medications.  Plan:  1. Vancomycin 750mg  IV Q12h - next dose due ~ 0600 05/13/12 2. Cefepime 1gm IV Q12h 3. Continue Levofloxacin 750mg  IV q24h 4. Follow renal function, culture data, and clinical progress. 5. Check Vancomycin levels as indicated.   [employer], Pharm.D., BCPS Clinical Pharmacist Pager [phone] 05/12/2012 5:15 PM

## 2012-05-13 ENCOUNTER — Inpatient Hospital Stay (HOSPITAL_COMMUNITY): Payer: Medicare Other

## 2012-05-13 DIAGNOSIS — I059 Rheumatic mitral valve disease, unspecified: Secondary | ICD-10-CM

## 2012-05-13 LAB — CARDIAC PANEL(CRET KIN+CKTOT+MB+TROPI)
CK, MB: 1.6 ng/mL (ref 0.3–4.0)
CK, MB: 1.5 ng/mL (ref 0.3–4.0)
Relative Index: INVALID (ref 0.0–2.5)
Total CK: 27 U/L (ref 7–177)
Troponin I: <0.3 ng/mL (ref <0.30)

## 2012-05-13 LAB — LEGIONELLA ANTIGEN, URINE: Legionella Antigen, Urine: NEGATIVE

## 2012-05-13 LAB — COMPREHENSIVE METABOLIC PANEL
ALT: 12 U/L (ref 0–35)
AST: 16 U/L (ref 0–37)
Alkaline Phosphatase: 57 U/L (ref 39–117)
CO2: 27 mEq/L (ref 19–32)
GFR calc Af Amer: 55 mL/min — ABNORMAL LOW (ref >90)
Glucose, Bld: 128 mg/dL — ABNORMAL HIGH (ref 70–99)
Potassium: 4 mEq/L (ref 3.5–5.1)
Sodium: 135 mEq/L (ref 135–145)
Total Protein: 6.4 g/dL (ref 6.0–8.3)

## 2012-05-13 LAB — URINE CULTURE: Colony Count: NO GROWTH

## 2012-05-13 LAB — CBC
HCT: 26.5 % — ABNORMAL LOW (ref 36.0–46.0)
MCH: 29.8 pg (ref 26.0–34.0)
MCHC: 31.7 g/dL (ref 30.0–36.0)
RDW: 14.3 % (ref 11.5–15.5)

## 2012-05-13 MED ORDER — PREDNISONE 20 MG PO TABS
20.0000 mg | ORAL_TABLET | Freq: Every day | ORAL | Status: DC
  Administered 2012-05-14 – 2012-05-15 (×2): 20 mg via ORAL
  Filled 2012-05-13 (×3): qty 1

## 2012-05-13 MED ORDER — FUROSEMIDE 10 MG/ML IJ SOLN
60.0000 mg | Freq: Two times a day (BID) | INTRAMUSCULAR | Status: DC
  Administered 2012-05-13 – 2012-05-14 (×3): 60 mg via INTRAVENOUS
  Filled 2012-05-13 (×3): qty 6

## 2012-05-13 NOTE — Progress Notes (Signed)
  Echocardiogram 2D Echocardiogram has been performed.  [name] 05/13/2012, 3:10 PM

## 2012-05-13 NOTE — Progress Notes (Signed)
[facility] Progress Note  05/13/2012   Subjective: Pt says she still feels very weak.  She ate some breakfast.  She denies CP and SOB.    Objective:  Vital signs in last 24 hours: Filed Vitals:   05/12/12 2110 05/13/12 0147 05/13/12 0154 05/13/12 0426  BP: 121/70 97/43 118/56 132/55  Pulse: 73 62  67  Temp: 99 F (37.2 C) 98 F (36.7 C)  98.6 F (37 C)  TempSrc: Oral Oral  Oral  Resp: 18 18  18   Height:      Weight:    94.7 kg (208 lb 12.4 oz)  SpO2: 98% 98%  100%   Weight change:   Intake/Output Summary (Last 24 hours) at 05/13/12 6295 Last data filed at 05/13/12 0254  Gross per 24 hour  Intake      0 ml  Output   1800 ml  Net  -1800 ml   Lab Results  Component Value Date   HGBA1C  Value: 5.8 (NOTE)                                                                       According to the ADA Clinical Practice Recommendations for 2011, when HbA1c is used as a screening test:   >=6.5%   Diagnostic of Diabetes Mellitus           (if abnormal result  is confirmed)  5.7-6.4%   Increased risk of developing Diabetes Mellitus  References:Diagnosis and Classification of Diabetes Mellitus,Diabetes Care,2011,34(Suppl 1):S62-S69 and Standards of Medical Care in         Diabetes - 2011,Diabetes Care,2011,34  (Suppl 1):S11-S61.* 06/18/2010   Lab Results  Component Value Date   LDLCALC  Value: 55        Total Cholesterol/HDL:CHD Risk Coronary Heart Disease Risk Table                     Men   Women  1/2 Average Risk   3.4   3.3  Average Risk       5.0   4.4  2 X Average Risk   9.6   7.1  3 X Average Risk  23.4   11.0        Use the calculated Patient Ratio above and the CHD Risk Table to determine the patient's CHD Risk.        ATP III CLASSIFICATION (LDL):  <100     mg/dL   Optimal  284-132  mg/dL   Near or Above                    Optimal  130-159  mg/dL   Borderline  440-102  mg/dL   High  >725     mg/dL   Very High 3/66/4403   CREATININE 1.07 05/13/2012    Review of Systems As  above, otherwise all reviewed and reported negative  Physical Exam General - awake, pale, no distress, cooperative HEENT - NCAT, MMM Lungs - BBS, shallow but less crackles than yesterday, bibasilar crackles CV - normal s1, s2 sounds Abd - soft, nondistended, no masses, nontender Ext - 1+ pitting edema bilateral lower extremities  Lab Results: Results for orders placed during the hospital encounter of  05/12/12 (from the past 24 hour(s))  CBC WITH DIFFERENTIAL     Status: Abnormal   Collection Time   05/12/12 12:32 PM      Component Value Range   WBC 8.2  4.0 - 10.5 K/uL   RBC 3.29 (*) 3.87 - 5.11 MIL/uL   Hemoglobin 10.1 (*) 12.0 - 15.0 g/dL   HCT 91.4 (*) 78.2 - 95.6 %   MCV 95.7  78.0 - 100.0 fL   MCH 30.7  26.0 - 34.0 pg   MCHC 32.1  30.0 - 36.0 g/dL   RDW 21.3  08.6 - 57.8 %   Platelets 291  150 - 400 K/uL   Neutrophils Relative 88 (*) 43 - 77 %   Neutro Abs 7.2  1.7 - 7.7 K/uL   Lymphocytes Relative 10 (*) 12 - 46 %   Lymphs Abs 0.8  0.7 - 4.0 K/uL   Monocytes Relative 2 (*) 3 - 12 %   Monocytes Absolute 0.2  0.1 - 1.0 K/uL   Eosinophils Relative 0  0 - 5 %   Eosinophils Absolute 0.0  0.0 - 0.7 K/uL   Basophils Relative 0  0 - 1 %   Basophils Absolute 0.0  0.0 - 0.1 K/uL  COMPREHENSIVE METABOLIC PANEL     Status: Abnormal   Collection Time   05/12/12 12:32 PM      Component Value Range   Sodium 138  135 - 145 mEq/L   Potassium 3.4 (*) 3.5 - 5.1 mEq/L   Chloride 96  96 - 112 mEq/L   CO2 30  19 - 32 mEq/L   Glucose, Bld 108 (*) 70 - 99 mg/dL   BUN 13  6 - 23 mg/dL   Creatinine, Ser 4.69  0.50 - 1.10 mg/dL   Calcium 9.1  8.4 - 62.9 mg/dL   Total Protein 7.4  6.0 - 8.3 g/dL   Albumin 3.0 (*) 3.5 - 5.2 g/dL   AST 20  0 - 37 U/L   ALT 13  0 - 35 U/L   Alkaline Phosphatase 72  39 - 117 U/L   Total Bilirubin 0.3  0.3 - 1.2 mg/dL   GFR calc non Af Amer 54 (*) >90 mL/min   GFR calc Af Amer 63 (*) >90 mL/min  LACTIC ACID, PLASMA     Status: Normal   Collection Time    05/12/12 12:49 PM      Component Value Range   Lactic Acid, Venous 1.7  0.5 - 2.2 mmol/L  URINALYSIS, ROUTINE W REFLEX MICROSCOPIC     Status: Normal   Collection Time   05/12/12  1:30 PM      Component Value Range   Color, Urine YELLOW  YELLOW   APPearance CLEAR  CLEAR   Specific Gravity, Urine 1.009  1.005 - 1.030   pH 6.5  5.0 - 8.0   Glucose, UA NEGATIVE  NEGATIVE mg/dL   Hgb urine dipstick NEGATIVE  NEGATIVE   Bilirubin Urine NEGATIVE  NEGATIVE   Ketones, ur NEGATIVE  NEGATIVE mg/dL   Protein, ur NEGATIVE  NEGATIVE mg/dL   Urobilinogen, UA 0.2  0.0 - 1.0 mg/dL   Nitrite NEGATIVE  NEGATIVE   Leukocytes, UA NEGATIVE  NEGATIVE  PRO B NATRIURETIC PEPTIDE     Status: Abnormal   Collection Time   05/12/12  2:56 PM      Component Value Range   Pro B Natriuretic peptide (BNP) 1758.0 (*) 0 - 450 pg/mL  HIV ANTIBODY (  ROUTINE TESTING)     Status: Normal   Collection Time   05/12/12  6:09 PM      Component Value Range   HIV NON REACTIVE  NON REACTIVE  TSH     Status: Normal   Collection Time   05/12/12  6:09 PM      Component Value Range   TSH 0.531  0.350 - 4.500 uIU/mL  CARDIAC PANEL(CRET KIN+CKTOT+MB+TROPI)     Status: Normal   Collection Time   05/12/12  6:09 PM      Component Value Range   Total CK 26  7 - 177 U/L   CK, MB 1.8  0.3 - 4.0 ng/mL   Troponin I <0.30  <0.30 ng/mL   Relative Index RELATIVE INDEX IS INVALID  0.0 - 2.5  MAGNESIUM     Status: Normal   Collection Time   05/12/12  6:09 PM      Component Value Range   Magnesium 1.7  1.5 - 2.5 mg/dL  STREP PNEUMONIAE URINARY ANTIGEN     Status: Normal   Collection Time   05/12/12  6:10 PM      Component Value Range   Strep Pneumo Urinary Antigen NEGATIVE  NEGATIVE  COMPREHENSIVE METABOLIC PANEL     Status: Abnormal   Collection Time   05/13/12 12:29 AM      Component Value Range   Sodium 135  135 - 145 mEq/L   Potassium 4.0  3.5 - 5.1 mEq/L   Chloride 97  96 - 112 mEq/L   CO2 27  19 - 32 mEq/L   Glucose, Bld 128  (*) 70 - 99 mg/dL   BUN 13  6 - 23 mg/dL   Creatinine, Ser 4.78  0.50 - 1.10 mg/dL   Calcium 8.4  8.4 - 29.5 mg/dL   Total Protein 6.4  6.0 - 8.3 g/dL   Albumin 2.4 (*) 3.5 - 5.2 g/dL   AST 16  0 - 37 U/L   ALT 12  0 - 35 U/L   Alkaline Phosphatase 57  39 - 117 U/L   Total Bilirubin 0.2 (*) 0.3 - 1.2 mg/dL   GFR calc non Af Amer 48 (*) >90 mL/min   GFR calc Af Amer 55 (*) >90 mL/min  CBC     Status: Abnormal   Collection Time   05/13/12 12:29 AM      Component Value Range   WBC 7.9  4.0 - 10.5 K/uL   RBC 2.82 (*) 3.87 - 5.11 MIL/uL   Hemoglobin 8.4 (*) 12.0 - 15.0 g/dL   HCT 62.1 (*) 30.8 - 65.7 %   MCV 94.0  78.0 - 100.0 fL   MCH 29.8  26.0 - 34.0 pg   MCHC 31.7  30.0 - 36.0 g/dL   RDW 84.6  96.2 - 95.2 %   Platelets 238  150 - 400 K/uL  PRO B NATRIURETIC PEPTIDE     Status: Abnormal   Collection Time   05/13/12 12:30 AM      Component Value Range   Pro B Natriuretic peptide (BNP) 2645.0 (*) 0 - 450 pg/mL  CARDIAC PANEL(CRET KIN+CKTOT+MB+TROPI)     Status: Normal   Collection Time   05/13/12 12:37 AM      Component Value Range   Total CK 27  7 - 177 U/L   CK, MB 1.6  0.3 - 4.0 ng/mL   Troponin I <0.30  <0.30 ng/mL   Relative Index RELATIVE INDEX IS INVALID  0.0 - 2.5    Micro Results: No results found for this or any previous visit (from the past 240 hour(s)).  Medications:  Scheduled Meds:   . sodium chloride   Intravenous STAT  . ceFEPime (MAXIPIME) IV  1 g Intravenous Q12H  . cholecalciferol  2,000 Units Oral Daily  . dipyridamole-aspirin  1 capsule Oral BID  . docusate sodium  200 mg Oral Daily  . donepezil  10 mg Oral QHS  . feeding supplement  237 mL Oral BID BM  . ferrous sulfate  325 mg Oral Q breakfast  . furosemide  60 mg Intravenous Once  . furosemide  60 mg Intravenous Q12H  . heparin  5,000 Units Subcutaneous Q8H  . levofloxacin (LEVAQUIN) IV  750 mg Intravenous Q24H  . levothyroxine  150 mcg Oral Q breakfast  . methylPREDNISolone (SOLU-MEDROL)  injection  20 mg Intravenous Q12H  . metoprolol tartrate  25 mg Oral BID  . multivitamin with minerals  1 tablet Oral Daily  . pantoprazole  40 mg Oral Q1200  . piperacillin-tazobactam (ZOSYN)  IV  3.375 g Intravenous Once  . potassium chloride  30 mEq Oral BID  . potassium chloride  30 mEq Oral Once  . ramipril  2.5 mg Oral Daily  . sertraline  50 mg Oral Daily  . simvastatin  20 mg Oral QHS  . sodium chloride  3 mL Intravenous Q12H  . vancomycin  750 mg Intravenous Q12H  . DISCONTD: furosemide  60 mg Intravenous Daily  . DISCONTD: heparin  5,000 Units Subcutaneous Q8H  . DISCONTD: sodium chloride  3 mL Intravenous Q12H  . DISCONTD: vancomycin  1,000 mg Intravenous Once   Continuous Infusions:   . DISCONTD: sodium chloride 125 mL/hr at 05/12/12 1332   PRN Meds:.sodium chloride, acetaminophen, ondansetron (ZOFRAN) IV, polyethylene glycol, sodium chloride, DISCONTD: sodium chloride, DISCONTD: sodium chloride  Assessment/Plan: Acute exacerbation of chronic diastolic heart failure  -Admit to telemetry under the CHF protocol, initiate diuresis, monitor electrolytes, replace potassium, monitor ins and outs and daily weights, check echocardiogram, cardiac enzymes negative for acute myocardial ischemia  - follow BNP and clinical course - increase lasix to 60 mg IV BID   Possible healthcare associated pneumonia  -The patient has been empirically started on vancomycin and Zosyn  -Repeat chest x-ray in a.m. after diuresis   Volume overload  -Diuresis ordered as above  -increasing lasix as detailed above  Hypoxia  -Continue supplemental oxygen support   Hypertension  - Resume home medications and follow   Hypothyroidism  - Check TSH  -Resume home thyroid supplement   Generalized weakness  -Consult PT and occupational therapy for evaluation and management recommendations   Dementia  -currently stable  Anemia-chronic  - Monitor closely with serial hemoglobin tests   LOS: 1  day   [name] 05/13/2012, 9:07 AM  Cleora Fleet, MD, CDE, FAAFP [facility] [place], [place]  [phone]

## 2012-05-14 ENCOUNTER — Encounter (HOSPITAL_COMMUNITY): Payer: Self-pay | Admitting: Radiology

## 2012-05-14 ENCOUNTER — Inpatient Hospital Stay (HOSPITAL_COMMUNITY): Payer: Medicare Other

## 2012-05-14 DIAGNOSIS — I5033 Acute on chronic diastolic (congestive) heart failure: Secondary | ICD-10-CM

## 2012-05-14 DIAGNOSIS — N179 Acute kidney failure, unspecified: Secondary | ICD-10-CM

## 2012-05-14 DIAGNOSIS — R4182 Altered mental status, unspecified: Secondary | ICD-10-CM

## 2012-05-14 DIAGNOSIS — J9600 Acute respiratory failure, unspecified whether with hypoxia or hypercapnia: Secondary | ICD-10-CM

## 2012-05-14 LAB — BASIC METABOLIC PANEL
BUN: 29 mg/dL — ABNORMAL HIGH (ref 6–23)
CO2: 28 mEq/L (ref 19–32)
CO2: 29 mEq/L (ref 19–32)
Chloride: 98 mEq/L (ref 96–112)
Chloride: 99 mEq/L (ref 96–112)
GFR calc non Af Amer: 24 mL/min — ABNORMAL LOW (ref >90)
Glucose, Bld: 95 mg/dL (ref 70–99)
Glucose, Bld: 160 mg/dL — ABNORMAL HIGH (ref 70–99)
Potassium: 4.4 mEq/L (ref 3.5–5.1)
Potassium: 4.8 mEq/L (ref 3.5–5.1)
Sodium: 137 mEq/L (ref 135–145)

## 2012-05-14 LAB — CBC
HCT: 27.6 % — ABNORMAL LOW (ref 36.0–46.0)
Hemoglobin: 8.7 g/dL — ABNORMAL LOW (ref 12.0–15.0)
MCH: 30.2 pg (ref 26.0–34.0)
MCHC: 31.5 g/dL (ref 30.0–36.0)
MCV: 95.8 fL (ref 78.0–100.0)
RBC: 2.88 MIL/uL — ABNORMAL LOW (ref 3.87–5.11)

## 2012-05-14 LAB — BLOOD GAS, ARTERIAL
Acid-Base Excess: 3.5 mmol/L — ABNORMAL HIGH (ref 0.0–2.0)
Bicarbonate: 27.9 mEq/L — ABNORMAL HIGH (ref 20.0–24.0)
TCO2: 29.3 mmol/L (ref 0–100)
pCO2 arterial: 45.3 mmHg — ABNORMAL HIGH (ref 35.0–45.0)
pH, Arterial: 7.407 (ref 7.350–7.450)
pO2, Arterial: 71.3 mmHg — ABNORMAL LOW (ref 80.0–100.0)

## 2012-05-14 LAB — GLUCOSE, CAPILLARY
Glucose-Capillary: 105 mg/dL — ABNORMAL HIGH (ref 70–99)
Glucose-Capillary: 99 mg/dL (ref 70–99)

## 2012-05-14 MED ORDER — DEXTROSE 5 % IV SOLN
1.0000 g | INTRAVENOUS | Status: DC
  Administered 2012-05-15: 1 g via INTRAVENOUS
  Filled 2012-05-14: qty 1

## 2012-05-14 MED ORDER — FUROSEMIDE 10 MG/ML IJ SOLN
40.0000 mg | Freq: Three times a day (TID) | INTRAMUSCULAR | Status: DC
  Administered 2012-05-14 – 2012-05-16 (×4): 40 mg via INTRAVENOUS
  Filled 2012-05-14 (×7): qty 4

## 2012-05-14 MED ORDER — LEVOFLOXACIN IN D5W 750 MG/150ML IV SOLN: 750.0000 mg | INTRAVENOUS | Status: DC

## 2012-05-14 MED ORDER — SACCHAROMYCES BOULARDII 250 MG PO CAPS
250.0000 mg | ORAL_CAPSULE | Freq: Two times a day (BID) | ORAL | Status: DC
  Administered 2012-05-14 – 2012-05-17 (×7): 250 mg via ORAL
  Filled 2012-05-14 (×8): qty 1

## 2012-05-14 NOTE — Progress Notes (Signed)
Utilization review completed.  

## 2012-05-14 NOTE — Consult Note (Addendum)
Advanced Heart Failure Team History and Physical Note   Primary Physician: Dr. Cain Saupe Primary Cardiologist:    Baseline proBNP: 1758 on 05/12/12 Weight Range: 200-202 pounds  HPI:    [name] is a 78 y.o. female who resides at [facility].  Medical history includes diastolic heart failure, dementia, TIAs, temporal arteritis and chronic UTIs (had been on prophylactic antibiotics for 1 year per records).    Admitted from 8/17 from her facility due to swelling in lower extremities and respiratory distress.  She had recently been placed on antibiotics from her facility due to possible PNA.  Pertinent labs include proBNP 2645, Cr 1.07, BUN 13, Trop negx3, afebrile, WBC 8.2, Hgb 8.7 (down from 10.1 on 8/17).  CXR showed increasing edema and CHF vs possible infiltrate.  [facility] for IV diuresis and continued antibiotics.  She has diuresed -2 liters but weight continues to trend up slightly since admit. Seems to be about 4-5 pounds from baseline. Repeat echo 05/13/12, showed LVEF 55-60%, grade 2 diastolic dysfunction.  Mod TR.   This morning rapid response was called due increased confusion, unequal pupils and temp of 101.1.  CT showed no acute stroke or bleed.  Now on Vanc and Maxipime.  Awake and alert but nonverbal. Doesn't follow commands. Respirations do not appear labored.  .    Review of Systems: [y] = yes, [ ]  = no  - unable to provide  General: Weight gain [ ] ; Weight loss [ ] ; Anorexia [ ] ; Fatigue [ ] ; Fever [ ] ; Chills [ ] ; Weakness [ ]   Cardiac: Chest pain/pressure [ ] ; Resting SOB [ ] ; Exertional SOB [ ] ; Orthopnea [ ] ; Pedal Edema [ ] ; Palpitations [ ] ; Syncope [ ] ; Presyncope [ ] ; Paroxysmal nocturnal dyspnea[ ]   Pulmonary: Cough [ ] ; Wheezing[ ] ; Hemoptysis[ ] ; Sputum [ ] ; Snoring [ ]   GI: Vomiting[ ] ; Dysphagia[ ] ; Melena[ ] ; Hematochezia [ ] ; Heartburn[ ] ; Abdominal pain [ ] ; Constipation [ ] ; Diarrhea [ ] ;  BRBPR [ ]   GU: Hematuria[ ] ; Dysuria [ ] ; Nocturia[ ]   Vascular: Pain in legs with walking [ ] ; Pain in feet with lying flat [ ] ; Non-healing sores [ ] ; Stroke [ ] ; TIA [ ] ; Slurred speech [ ] ;  Neuro: Headaches[ ] ; Vertigo[ ] ; Seizures[ ] ; Paresthesias[ ] ;Blurred vision [ ] ; Diplopia [ ] ; Vision changes [ ]   Ortho/Skin: Arthritis [ ] ; Joint pain [ ] ; Muscle pain [ ] ; Joint swelling [ ] ; Back Pain [ ] ; Rash [ ]   Psych: Depression[ ] ; Anxiety[ ]   Heme: Bleeding problems [ ] ; Clotting disorders [ ] ; Anemia [ ]   Endocrine: Diabetes [ ] ; Thyroid dysfunction[ ]   Home Medications Prior to Admission medications   Medication Sig Start Date End Date Taking? Authorizing Provider  acetaminophen (TYLENOL) 325 MG tablet Take 650 mg by mouth every 6 (six) hours as needed. For pain   Yes Historical Provider, MD  Calcium Carbonate-Vitamin D (CALCIUM + D PO) Take 2 tablets by mouth 2 (two) times daily.   Yes Historical Provider, MD  cholecalciferol (VITAMIN D) 1000 UNITS tablet Take 2,000 Units by mouth daily.   Yes Historical Provider, MD  CRANBERRY EXTRACT PO Take 1 capsule by mouth daily.   Yes Historical Provider, MD  dipyridamole-aspirin (AGGRENOX) 25-200 MG per 12 hr capsule Take 1 capsule by mouth 2 (two) times daily.     Yes Historical Provider, MD  docusate sodium (COLACE) 100 MG capsule Take  200 mg by mouth daily.    Yes Historical Provider, MD  donepezil (ARICEPT) 10 MG tablet Take 10 mg by mouth at bedtime.    Yes Historical Provider, MD  feeding supplement (ENSURE COMPLETE) LIQD Take 237 mLs by mouth 2 (two) times daily between meals. 03/14/12  Yes Laveda Norman, MD  ferrous sulfate 325 (65 FE) MG tablet Take 325 mg by mouth daily.    Yes Historical Provider, MD  levothyroxine (SYNTHROID, LEVOTHROID) 150 MCG tablet Take 150 mcg by mouth daily.     Yes Historical Provider, MD  metoprolol tartrate (LOPRESSOR) 12.5 mg TABS Take 1 tablet (25 mg total) by mouth 2 (two) times daily. 03/14/12  Yes Laveda Norman, MD  Multiple Vitamin (MULITIVITAMIN WITH MINERALS) TABS Take 1 tablet by mouth daily.     Yes Historical Provider, MD  omeprazole (PRILOSEC) 20 MG capsule Take 20 mg by mouth daily.     Yes Historical Provider, MD  polyethylene glycol (MIRALAX / GLYCOLAX) packet Take 17 g by mouth daily as needed. For constipation.   Yes Historical Provider, MD  predniSONE (DELTASONE) 5 MG tablet Take 10-15 mg by mouth See admin instructions. Alternating daily between 10mg  and 15mg    Yes Historical Provider, MD  sertraline (ZOLOFT) 50 MG tablet Take 50 mg by mouth daily.     Yes Historical Provider, MD  simvastatin (ZOCOR) 20 MG tablet Take 20 mg by mouth at bedtime.     Yes Historical Provider, MD    Past Medical History: Past Medical History  Diagnosis Date  . Peripheral neuropathy   . Hypertension   . TIA (transient ischemic attack)   . Elevated sed rate   . Temporal arteritis     chronic steroids  . Hypothyroidism   . Colon cancer     colon  . History of hernia repair   . S/P rotator cuff surgery     right  . Diastolic CHF   . Cardiomegaly - hypertensive   . Chronic anemia   . Chronic steroid use   . Dementia   . Shortness of breath   . Hyperlipidemia   . CHF (congestive heart failure)   . Cardiomyopathy due to hypertension   . Dependence on supplemental oxygen   . Chronic UTI     Past Surgical History: Past Surgical History  Procedure Date  . Tonsillectomy   . Appendectomy   . Abdominal hysterectomy   . Colectomy     Family History: - unable to obtain from patient No family history on file.  Social History: History   Social History  . Marital Status: Widowed    Spouse Name: N/A    Number of Children: N/A  . Years of Education: N/A   Social History Main Topics  . Smoking status: Never Smoker   . Smokeless tobacco: None  . Alcohol Use: No  . Drug Use: No  . Sexually Active: Not Currently   Other Topics Concern  . None   Social History Narrative  . None     Allergies:  Allergies  Allergen Reactions  . Ciprofloxacin Nausea And Vomiting  . Codeine Other (See Comments)    halucinations  . Demerol Other (See Comments)    halucinations  . Morphine And Related Other (See Comments)    halucinations  . Nitrofuran Derivatives Other (See Comments)    unknown  . Pregabalin Other (See Comments)    unknown  . Septra (Bactrim) Other (See Comments)    Unknown  cant tolerate DS  . Zyrtec (Cetirizine Hcl) Other (See Comments)    Makes loopy    Objective:    Vital Signs:   Temp:  [97.6 F (36.4 C)-98.8 F (37.1 C)] 98.8 F (37.1 C) (08/19 0605) Pulse Rate:  [59-86] 72  (08/19 1100) Resp:  [18-22] 22  (08/19 0605) BP: (112-169)/(48-72) 138/54 mmHg (08/19 1100) SpO2:  [92 %-95 %] 92 % (08/19 0700) Weight:  [94 kg (207 lb 3.7 oz)] 94 kg (207 lb 3.7 oz) (08/19 0605) Last BM Date: 05/14/12 Filed Weights   05/12/12 1659 05/13/12 0426 05/14/12 7829  Weight: 93.2 kg (205 lb 7.5 oz) 94.7 kg (208 lb 12.4 oz) 94 kg (207 lb 3.7 oz)    Physical Exam: General:  Elderly No resp difficulty. Awake/alert. Non-verbal HEENT: normal Neck: supple. JVP very hard to see. Look about 6-7 . Carotids 2+ bilat; no bruits. No lymphadenopathy or thryomegaly appreciated. Cor: PMI nondisplaced. Regular rate & rhythm. No rubs, gallops. 2/6 TR murmur Lungs: mild crackles at bases Abdomen: soft, nontender, nondistended. No hepatosplenomegaly. No bruits or masses. Good bowel sounds. Extremities: no cyanosis, clubbing, rash, tr-1+ edema Neuro: alert & orientedx3, cranial nerves grossly intact. moves all 4 extremities w/o difficulty. Affect pleasant  Telemetry: SR  Labs: Basic Metabolic Panel:  Lab 05/14/12 5621 05/13/12 0029 05/12/12 1809 05/12/12 1232  NA 136 135 -- 138  K 4.4 4.0 -- 3.4*  CL 98 97 -- 96  CO2 28 27 -- 30  GLUCOSE 95 128* -- 108*  BUN 29* 13 -- 13  CREATININE 1.86* 1.07 -- 0.96  CALCIUM 9.1 8.4 -- 9.1  MG -- -- 1.7 --  PHOS -- -- -- --     Liver Function Tests:  Lab 05/13/12 0029 05/12/12 1232  AST 16 20  ALT 12 13  ALKPHOS 57 72  BILITOT 0.2* 0.3  PROT 6.4 7.4  ALBUMIN 2.4* 3.0*   No results found for this basename: LIPASE:5,AMYLASE:5 in the last 168 hours No results found for this basename: AMMONIA:3 in the last 168 hours  CBC:  Lab 05/14/12 0822 05/13/12 0029 05/12/12 1232  WBC 10.5 7.9 8.2  NEUTROABS -- -- 7.2  HGB 8.7* 8.4* 10.1*  HCT 27.6* 26.5* 31.5*  MCV 95.8 94.0 95.7  PLT 261 238 291    Cardiac Enzymes:  Lab 05/13/12 0845 05/13/12 0037 05/12/12 1809  CKTOTAL 25 27 26   CKMB 1.5 1.6 1.8  CKMBINDEX -- -- --  TROPONINI <0.30 <0.30 <0.30    BNP: BNP (last 3 results)  Basename 05/14/12 0826 05/13/12 0030 05/12/12 1456  PROBNP 4323.0* 2645.0* 1758.0*    CBG:  Lab 05/14/12 0828 05/14/12 0710  GLUCAP 99 105*    Coagulation Studies: No results found for this basename: LABPROT:5,INR:5 in the last 72 hours  Other results: EKG: SR 68 iRBBB No ST-T wave abnormalities.    Imaging: Ct Head Wo Contrast  05/14/2012  *RADIOLOGY REPORT*  Clinical Data: Decreased level of consciousness, unable to speak.  CT HEAD WITHOUT CONTRAST  Technique:  Contiguous axial images were obtained from the base of the skull through the vertex without contrast.  Comparison: 03/08/2012  Findings: The patient had difficulty remaining motionless for the study.  Images are suboptimal.  Small or subtle lesions could be overlooked.  There is no evidence for acute infarction, intracranial hemorrhage, mass lesion, hydrocephalus, or extra-axial fluid. Moderate to severe atrophy is present.  Advanced chronic microvascular ischemic change is present in the periventricular and subcortical white matter.  Calvarium intact.  Clear sinuses and mastoids.  Vascular calcification.  Unchanged from priors.  IMPRESSION: Atrophy with chronic microvascular ischemic change.  No visible acute stroke or bleed.   Original Report Authenticated By:  Elsie Stain, M.D. ( 05/14/2012 08:34:44 )    Dg Chest Port 1 View  05/14/2012  *RADIOLOGY REPORT*  Clinical Data: Congestive heart failure exacerbation.  Cough. Vomiting.  Shortness of breath.  PORTABLE CHEST - 1 VIEW  Comparison: 05/13/2012  Findings: Artifact overlies chest.  Cardiomegaly persists. Interstitial and alveolar edema has worsened.  There is a small amount of pleural fluid.  IMPRESSION: Worsening pulmonary edema pattern.  Coexistent pneumonia is not excluded, but the findings may be explained completely by worsening congestive heart failure.   Original Report Authenticated By: Thomasenia Sales, M.D. ( 05/14/2012 07:52:34 )    Dg Chest Port 1 View  05/13/2012  *RADIOLOGY REPORT*  Clinical Data: CHF  PORTABLE CHEST - 1 VIEW  Comparison: Yesterday  Findings: Increasing edema which is moderate and predominately interstitial.  Patchy airspace disease at the lung bases has increased likely related to edema.  Mild cardiomegaly.  No pneumothorax.  IMPRESSION: Increasing edema and CHF.  Original Report Authenticated By: Donavan Burnet, M.D.   Dg Chest Port 1 View  05/12/2012  *RADIOLOGY REPORT*  Clinical Data: Weakness, shortness of breath.  Chest pain.  PORTABLE CHEST - 1 VIEW  Comparison: 03/08/2012  Findings: Heart is enlarged.  There is pulmonary vascular congestion.  Prominent interstitial markings are consistent with mild interstitial edema.  Left pleural effusion is present.  No focal consolidations are identified.  IMPRESSION:  1.  Cardiomegaly and changes of interstitial edema. 2.  Left pleural effusion.  Original Report Authenticated By: Patterson Hammersmith, M.D.        Assessment:  1. Acute respiratory failure - likely multifactorial 2. Probable sepsis/PNA 3. Chronic diastolic HF 4. Acute delirium 5. Dementia 6. Acute renal failure - Cr. 1.0-> 1.86 7. Anemia, acute on chronic 8. DNR/DNI  Plan/Discussion:    Although weight up slightly and BNP up, I do not think HF is major  issue as her degree of difficulty is far out of proportion to her volume overload and renal function is worsening with attempts at diuresis. I worry more about infectious causes at this point. Agree with broad spectrum abx. Would also check pro-calcitonin and non-contrast CT chest to more clearly assess lung fields. Would hold lasix for now.   Hgb has also been dropping steadily over past few months and this likely needs to be looked into.   We will follow her progress with you.    Karen Plack,MD 12:32 PM      Length of Stay: 2

## 2012-05-14 NOTE — Progress Notes (Signed)
Asked to assist with patient with change in LOC - met patient in CT scan- arouses - lethargic - will follow commands - one step - able to answer questions - knows name place month = mae x 4 - feels hot to touch - tachypnea but NAD - pale - tol CT scan - back to room 4740 - labs drawn - rectal temp 101.1 - 139/48 manual cuff BP HR 73 RR 24 - O2 sat 91-93 % on 4 liter nasal cannula - an increase in O2 need since yesterday - bil BS present - no overt rales heard but AM PCXR shows increasing pul edema pattern - abd soft - 2+ pitting edema in feet - Hgb down 1.5  gms yesterday - repeat just drawn for today.  Congested cough. Foley patent - urine looks clear. +145 cc for 24 hours yesterday.  Patient denies chest pain - "just feeling lousy."  RN Tiffany speaking with Dr. Laural Benes on phone with update.  MD to see.  ABG pending.  Will follow as needed.

## 2012-05-14 NOTE — Progress Notes (Signed)
Pt had a change in LOC, pt was not able to state her name or date of birth, could shake her head yes or no, and say she was ok or in pain; her pupils were unequal, 3mm on R, nonreactive to light and 2 mm on L but reactive to light, grips were WNL, pt was found with O2 off by NT, and pt has been very SOB on exertion, notified MD gave order to do a CT head and ABG's will continue to monitor, Lavonda Jumbo RN

## 2012-05-14 NOTE — Progress Notes (Signed)
Results of CT noted.  No evidence of PNA. + edema/effusions.  Procalcitonin is normal.  Thus will restart lasix. Repeat BMET this afternoon. If renal function continues to worsen may warrant brief trial of milrinone to help with diuresis.   Reuel Boom Bensimhon,MD 4:23 PM

## 2012-05-14 NOTE — Progress Notes (Signed)
Dr. Laural Benes notified about pts temp of 101.1, CT results, and lethargy.  Was notified that he would be here to see pt asap.

## 2012-05-14 NOTE — Progress Notes (Signed)
[facility] Progress Note  05/14/2012  Subjective: Called to assess pt as she has developed fever and increasing confusion.    Objective:  Vital signs in last 24 hours: Filed Vitals:   05/13/12 1430 05/13/12 2109 05/14/12 0605 05/14/12 0700  BP: 128/59 112/48 134/72 169/70  Pulse: 59 62 86 79  Temp: 97.6 F (36.4 C) 97.8 F (36.6 C) 98.8 F (37.1 C)   TempSrc: Oral Oral Oral   Resp: 18 20 22    Height:      Weight:   94 kg (207 lb 3.7 oz)   SpO2: 95% 95% 95% 92%   Weight change: 0.8 kg (1 lb 12.2 oz)  Intake/Output Summary (Last 24 hours) at 05/14/12 4098 Last data filed at 05/14/12 0844  Gross per 24 hour  Intake    525 ml  Output   1050 ml  Net   -525 ml   Lab Results  Component Value Date   HGBA1C  Value: 5.8 (NOTE)                                                                       According to the ADA Clinical Practice Recommendations for 2011, when HbA1c is used as a screening test:   >=6.5%   Diagnostic of Diabetes Mellitus           (if abnormal result  is confirmed)  5.7-6.4%   Increased risk of developing Diabetes Mellitus  References:Diagnosis and Classification of Diabetes Mellitus,Diabetes Care,2011,34(Suppl 1):S62-S69 and Standards of Medical Care in         Diabetes - 2011,Diabetes Care,2011,34  (Suppl 1):S11-S61.* 06/18/2010   Lab Results  Component Value Date   LDLCALC  Value: 55        Total Cholesterol/HDL:CHD Risk Coronary Heart Disease Risk Table                     Men   Women  1/2 Average Risk   3.4   3.3  Average Risk       5.0   4.4  2 X Average Risk   9.6   7.1  3 X Average Risk  23.4   11.0        Use the calculated Patient Ratio above and the CHD Risk Table to determine the patient's CHD Risk.        ATP III CLASSIFICATION (LDL):  <100     mg/dL   Optimal  119-147  mg/dL   Near or Above                    Optimal  130-159  mg/dL   Borderline  829-562  mg/dL   High  >130     mg/dL   Very High 8/65/7846   CREATININE 1.07 05/13/2012    Review  of Systems As above, otherwise all reviewed and reported negative  Physical Exam General - awake, no distress, pale, cooperative HEENT - NCAT, MM dry Lungs - BBS, shallow with basilar crackles CV - normal s1, s2 sounds Abd - soft, nondistended, no masses, nontender Ext - 1+ edema bilateral LEs  Neuro - nonfocal  Lab Results: Results for orders placed during the hospital encounter of 05/12/12 (from the past  24 hour(s))  CLOSTRIDIUM DIFFICILE BY PCR     Status: Normal   Collection Time   05/13/12  9:58 PM      Component Value Range   C difficile by pcr NEGATIVE  NEGATIVE  GLUCOSE, CAPILLARY     Status: Abnormal   Collection Time   05/14/12  7:10 AM      Component Value Range   Glucose-Capillary 105 (*) 70 - 99 mg/dL  GLUCOSE, CAPILLARY     Status: Normal   Collection Time   05/14/12  8:28 AM      Component Value Range   Glucose-Capillary 99  70 - 99 mg/dL    Micro Results: Recent Results (from the past 240 hour(s))  CULTURE, BLOOD (ROUTINE X 2)     Status: Normal (Preliminary result)   Collection Time   05/12/12 12:43 PM      Component Value Range Status Comment   Specimen Description BLOOD RIGHT ARM   Final    Special Requests BOTTLES DRAWN AEROBIC ONLY 6CC   Final    Culture  Setup Time 05/12/2012 16:55   Final    Culture     Final    Value:        BLOOD CULTURE RECEIVED NO GROWTH TO DATE CULTURE WILL BE HELD FOR 5 DAYS BEFORE ISSUING A FINAL NEGATIVE REPORT   Report Status PENDING   Incomplete   CULTURE, BLOOD (ROUTINE X 2)     Status: Normal (Preliminary result)   Collection Time   05/12/12 12:50 PM      Component Value Range Status Comment   Specimen Description BLOOD RIGHT ARM   Final    Special Requests BOTTLES DRAWN AEROBIC AND ANAEROBIC 10CC   Final    Culture  Setup Time 05/12/2012 16:55   Final    Culture     Final    Value:        BLOOD CULTURE RECEIVED NO GROWTH TO DATE CULTURE WILL BE HELD FOR 5 DAYS BEFORE ISSUING A FINAL NEGATIVE REPORT   Report Status  PENDING   Incomplete   URINE CULTURE     Status: Normal   Collection Time   05/12/12  1:30 PM      Component Value Range Status Comment   Specimen Description URINE, CATHETERIZED   Final    Special Requests NONE   Final    Culture  Setup Time 05/12/2012 16:55   Final    Colony Count NO GROWTH   Final    Culture NO GROWTH   Final    Report Status 05/13/2012 FINAL   Final   CLOSTRIDIUM DIFFICILE BY PCR     Status: Normal   Collection Time   05/13/12  9:58 PM      Component Value Range Status Comment   C difficile by pcr NEGATIVE  NEGATIVE Final     Medications:  Scheduled Meds:   . ceFEPime (MAXIPIME) IV  1 g Intravenous Q12H  . cholecalciferol  2,000 Units Oral Daily  . dipyridamole-aspirin  1 capsule Oral BID  . docusate sodium  200 mg Oral Daily  . donepezil  10 mg Oral QHS  . feeding supplement  237 mL Oral BID BM  . ferrous sulfate  325 mg Oral Q breakfast  . furosemide  60 mg Intravenous Q12H  . heparin  5,000 Units Subcutaneous Q8H  . levofloxacin (LEVAQUIN) IV  750 mg Intravenous Q24H  . levothyroxine  150 mcg Oral Q breakfast  . metoprolol  tartrate  25 mg Oral BID  . multivitamin with minerals  1 tablet Oral Daily  . pantoprazole  40 mg Oral Q1200  . potassium chloride  30 mEq Oral BID  . predniSONE  20 mg Oral Q breakfast  . ramipril  2.5 mg Oral Daily  . saccharomyces boulardii  250 mg Oral BID  . sertraline  50 mg Oral Daily  . simvastatin  20 mg Oral QHS  . sodium chloride  3 mL Intravenous Q12H  . vancomycin  750 mg Intravenous Q12H  . DISCONTD: methylPREDNISolone (SOLU-MEDROL) injection  20 mg Intravenous Q12H   Continuous Infusions:  PRN Meds:.sodium chloride, acetaminophen, ondansetron (ZOFRAN) IV, polyethylene glycol, sodium chloride  Assessment/Plan: Acute exacerbation of chronic diastolic heart failure  -Continue CHF protocol, initiate diuresis, monitor electrolytes, replace potassium, monitor ins and outs and daily weights, echocardiogram reveals  slightly worsening diastolic HF, cardiac enzymes negative for acute myocardial ischemia  - follow BNP and clinical course; AM BNP Pending - lasix to 60 mg IV BID  -consulted Heart Failure team for further recommendations  Healthcare associated pneumonia  -The patient has been empirically started on vancomycin and Zosyn, levaquin  - ABG pending   Volume overload  -Diuresis ordered as above  -lasix as detailed above   Hypoxia  -Continue supplemental oxygen support   Hypertension  - Resume home medications and follow   Hypothyroidism  -Resumed home thyroid supplement   Generalized weakness  -Consult PT and occupational therapy for evaluation and management recommendations   Dementia  -pt with increasing confusion, likely some delerium is a component of the increasing confusion - CT head neg for acute findings  Anemia-chronic  - Monitor closely with serial hemoglobin tests  Low threshold for transferring to higher level care if any decompensation   LOS: 2 days   Clanford Johnson 05/14/2012, 9:09 AM  Cleora Fleet, MD, CDE, FAAFP [facility] [place], [place]  [phone]

## 2012-05-14 NOTE — Progress Notes (Signed)
ANTIBIOTIC CONSULT NOTE - Follow-Up  Pharmacy Consult for Vancomycin, Levofloxacin, and Cefepime Indication: pneumonia (HCAP)  Allergies  Allergen Reactions  . Ciprofloxacin Nausea And Vomiting  . Codeine Other (See Comments)    halucinations  . Demerol Other (See Comments)    halucinations  . Morphine And Related Other (See Comments)    halucinations  . Nitrofuran Derivatives Other (See Comments)    unknown  . Pregabalin Other (See Comments)    unknown  . Septra (Bactrim) Other (See Comments)    Unknown cant tolerate DS  . Zyrtec (Cetirizine Hcl) Other (See Comments)    Makes loopy    Patient Measurements: Height: 5\' 10"  (177.8 cm) Weight: 207 lb 3.7 oz (94 kg) (bedscale) IBW/kg (Calculated) : 68.5   Vital Signs: Temp: 98.8 F (37.1 C) (08/19 0605) Temp src: Oral (08/19 0605) BP: 138/54 mmHg (08/19 1100) Pulse Rate: 72  (08/19 1100) Intake/Output from previous day: 08/18 0701 - 08/19 0700 In: 645 [P.O.:480; I.V.:3; IV Piggyback:162] Out: 500 [Urine:500] Intake/Output from this shift: Total I/O In: 3 [I.V.:3] Out: 550 [Urine:550]  Labs:  [facility] 05/14/12 0822 05/13/12 0029 05/12/12 1232  WBC 10.5 7.9 8.2  HGB 8.7* 8.4* 10.1*  PLT 261 238 291  LABCREA -- -- --  CREATININE 1.86* 1.07 0.96   Estimated Creatinine Clearance: 30 ml/min (by C-G formula based on Cr of 1.86).  Microbiology: Recent Results (from the past 720 hour(s))  CULTURE, BLOOD (ROUTINE X 2)     Status: Normal (Preliminary result)   Collection Time   05/12/12 12:43 PM      Component Value Range Status Comment   Specimen Description BLOOD RIGHT ARM   Final    Special Requests BOTTLES DRAWN AEROBIC ONLY 6CC   Final    Culture  Setup Time 05/12/2012 16:55   Final    Culture     Final    Value:        BLOOD CULTURE RECEIVED NO GROWTH TO DATE CULTURE WILL BE HELD FOR 5 DAYS BEFORE ISSUING A FINAL NEGATIVE REPORT   Report Status PENDING   Incomplete   CULTURE, BLOOD (ROUTINE X 2)     Status:  Normal (Preliminary result)   Collection Time   05/12/12 12:50 PM      Component Value Range Status Comment   Specimen Description BLOOD RIGHT ARM   Final    Special Requests BOTTLES DRAWN AEROBIC AND ANAEROBIC 10CC   Final    Culture  Setup Time 05/12/2012 16:55   Final    Culture     Final    Value:        BLOOD CULTURE RECEIVED NO GROWTH TO DATE CULTURE WILL BE HELD FOR 5 DAYS BEFORE ISSUING A FINAL NEGATIVE REPORT   Report Status PENDING   Incomplete   URINE CULTURE     Status: Normal   Collection Time   05/12/12  1:30 PM      Component Value Range Status Comment   Specimen Description URINE, CATHETERIZED   Final    Special Requests NONE   Final    Culture  Setup Time 05/12/2012 16:55   Final    Colony Count NO GROWTH   Final    Culture NO GROWTH   Final    Report Status 05/13/2012 FINAL   Final   CLOSTRIDIUM DIFFICILE BY PCR     Status: Normal   Collection Time   05/13/12  9:58 PM      Component Value Range Status  Comment   C difficile by pcr NEGATIVE  NEGATIVE Final     Assessment: Pt on Day #3 of Vanco/Levaquin/Cefepime for HCAP. Creat up to 1.86 past 24 hours. UOP down yesterday to 0.2 ml/kg/hr but appears better today. Levaquin d/c today after 3 days per PNA order admission set. All cultures negative so far.  Goal of Therapy:  Vancomycin trough level 15-20 mcg/ml Renal dose adjustment of other medications.  Plan:  1. Hold vancomycin.  2. Will check trough tonight with large increase in SCr past 24 hours. 3. Change Cefepime to 1gm IV Q24h 4. Follow renal function, culture data, clinical progress, and vancomycin trough. 5. MD consider holding ACE-I with Creat up to 1.86.  Christoper Fabian, PharmD, BCPS Clinical pharmacist, pager [phone] 05/14/2012 11:11 AM

## 2012-05-15 DIAGNOSIS — R5383 Other fatigue: Secondary | ICD-10-CM

## 2012-05-15 LAB — BASIC METABOLIC PANEL
BUN: 32 mg/dL — ABNORMAL HIGH (ref 6–23)
CO2: 30 mEq/L (ref 19–32)
Chloride: 97 mEq/L (ref 96–112)
Glucose, Bld: 81 mg/dL (ref 70–99)
Potassium: 4.3 mEq/L (ref 3.5–5.1)
Sodium: 137 mEq/L (ref 135–145)

## 2012-05-15 LAB — CBC
HCT: 32.3 % — ABNORMAL LOW (ref 36.0–46.0)
Hemoglobin: 10 g/dL — ABNORMAL LOW (ref 12.0–15.0)
MCH: 29.6 pg (ref 26.0–34.0)
MCHC: 31 g/dL (ref 30.0–36.0)
RBC: 3.38 MIL/uL — ABNORMAL LOW (ref 3.87–5.11)

## 2012-05-15 LAB — PRO B NATRIURETIC PEPTIDE: Pro B Natriuretic peptide (BNP): 6703 pg/mL — ABNORMAL HIGH (ref 0–450)

## 2012-05-15 MED ORDER — FLUCONAZOLE 150 MG PO TABS
150.0000 mg | ORAL_TABLET | Freq: Once | ORAL | Status: AC
  Administered 2012-05-16: 150 mg via ORAL
  Filled 2012-05-15: qty 1

## 2012-05-15 MED ORDER — PREDNISONE 10 MG PO TABS
10.0000 mg | ORAL_TABLET | Freq: Every day | ORAL | Status: DC
  Administered 2012-05-16 – 2012-05-17 (×2): 10 mg via ORAL
  Filled 2012-05-15 (×3): qty 1

## 2012-05-15 MED ORDER — VANCOMYCIN HCL 1000 MG IV SOLR
1250.0000 mg | INTRAVENOUS | Status: DC
  Administered 2012-05-15: 1250 mg via INTRAVENOUS
  Filled 2012-05-15: qty 1250

## 2012-05-15 MED ORDER — LEVOFLOXACIN IN D5W 750 MG/150ML IV SOLN
750.0000 mg | INTRAVENOUS | Status: DC
  Administered 2012-05-15: 750 mg via INTRAVENOUS
  Filled 2012-05-15 (×2): qty 150

## 2012-05-15 NOTE — Progress Notes (Signed)
Clinical Social Work Department BRIEF PSYCHOSOCIAL ASSESSMENT 05/15/2012  Patient:  [name], [name]     Account Number:  [id number]     Admit date:  05/12/2012  Clinical Social Worker:  Juliette Mangle  Date/Time:  05/15/2012 11:38 AM  Referred by:  RN  Date Referred:  05/15/2012 Referred for  ALF Placement   Other Referral:   Patient from [facility]   Interview type:   Other interview type:    PSYCHOSOCIAL DATA Living Status:  FACILITY Admitted from facility:   Level of care:  Assisted Living Primary support name:  [name] Primary support relationship to patient:  CHILD, ADULT Degree of support available:   Good    CURRENT CONCERNS Current Concerns  Post-Acute Placement   Other Concerns:    SOCIAL WORK ASSESSMENT / PLAN CSW met with patient at bedside and CSW could not arouse patient. CSW contacted patient's daughterJaquita Rector [phone]. Patient's daughter reported that patient is from ALF- [facility] [phone]. CSW will continue to follow and assist with discharge planning and all discharge needs.   Assessment/plan status:  Psychosocial Support/Ongoing Assessment of Needs Other assessment/ plan:   Information/referral to community resources:    PATIENT'S/FAMILY'S RESPONSE TO PLAN OF CARE: Patient was asleep. DAughter was appreciative of information and support provided by CSW. CSW will continue to follow and assist with all d/c needs.     Sabino Niemann, MSW, [employer] [phone]

## 2012-05-15 NOTE — Evaluation (Signed)
Clinical/Bedside Swallow Evaluation Patient Details  Name: [name] MRN: [id number] Date of Birth: [birth year]  Today's Date: 05/15/2012 Time: 2130-8657 SLP Time Calculation (min): 22 min  Past Medical History:  Past Medical History  Diagnosis Date  . Peripheral neuropathy   . Hypertension   . TIA (transient ischemic attack)   . Elevated sed rate   . Temporal arteritis     chronic steroids  . Hypothyroidism   . Colon cancer     colon  . History of hernia repair   . S/P rotator cuff surgery     right  . Diastolic CHF   . Cardiomegaly - hypertensive   . Chronic anemia   . Chronic steroid use   . Dementia   . Shortness of breath   . Hyperlipidemia   . CHF (congestive heart failure)   . Cardiomyopathy due to hypertension   . Dependence on supplemental oxygen   . Chronic UTI    Past Surgical History:  Past Surgical History  Procedure Date  . Tonsillectomy   . Appendectomy   . Abdominal hysterectomy   . Colectomy    HPI:  [name] is an 78 y.o. female resident of nursing home with dementia, chronic diastolic CHF was sent to ER by primary care provider with complaints of persistent shortness of breath requiring additional supplemental oxygen. Pt found to have volume overload with pna. On 05/14/12 pt had increased confusion, febrile.    Assessment / Plan / Recommendation Clinical Impression  Pt presents with overt clinical signs of aspiration, hard cough immediately following sips of thin liquids, as well as delayed throat clearing and wet vocal quality. Discussed with MD who agrees with objective testing to determine pts safety with Pos. Will complete MBS tomorrow. Suggest pt downgrade to dys 3/nectar with full supervision, give pills crushed in puree as RN reports specific difficulty taking pills whole in puree.     Aspiration Risk  Moderate    Diet Recommendation Dysphagia 3 (Mechanical Soft);Nectar-thick liquid   Liquid Administration via: Cup;No  straw Medication Administration: Crushed with puree Supervision: Full supervision/cueing for compensatory strategies Compensations: Slow rate;Small sips/bites Postural Changes and/or Swallow Maneuvers: Seated upright 90 degrees    Other  Recommendations Recommended Consults: MBS Oral Care Recommendations: Oral care BID   Follow Up Recommendations  Skilled Nursing facility    Frequency and Duration        Pertinent Vitals/Pain NA    SLP Swallow Goals     Swallow Study Prior Functional Status       General HPI: [name] is an 78 y.o. female resident of nursing home with dementia, chronic diastolic CHF was sent to ER by primary care provider with complaints of persistent shortness of breath requiring additional supplemental oxygen. Pt found to have volume overload with pna. On 05/14/12 pt had increased confusion, febrile.  Type of Study: Bedside swallow evaluation Diet Prior to this Study: Regular;Thin liquids Temperature Spikes Noted: No Respiratory Status: Supplemental O2 delivered via (comment) History of Recent Intubation: No Behavior/Cognition: Alert;Cooperative;Pleasant mood;Confused Oral Cavity - Dentition: Adequate natural dentition Self-Feeding Abilities: Able to feed self Patient Positioning: Upright in bed Baseline Vocal Quality: Clear Volitional Cough: Strong Volitional Swallow: Able to elicit    Oral/Motor/Sensory Function Overall Oral Motor/Sensory Function: Appears within functional limits for tasks assessed   Ice Chips     Thin Liquid Thin Liquid: Impaired Presentation: Cup;Straw;Self Fed Pharyngeal  Phase Impairments: Suspected delayed Swallow;Decreased hyoid-laryngeal movement;Throat Clearing - Immediate;Cough - Immediate;Wet  Vocal Quality    Nectar Thick Nectar Thick Liquid: Not tested   Honey Thick Honey Thick Liquid: Not tested   Puree Puree: Within functional limits   Solid   GO    Solid: Impaired Pharyngeal Phase Impairments: Throat  Clearing - Delayed       Marybelle Giraldo, Riley Nearing 05/15/2012,3:31 PM

## 2012-05-15 NOTE — Progress Notes (Addendum)
ANTIBIOTIC CONSULT NOTE - FOLLOW UP  Pharmacy Consult for vancomycin and cefepime Indication: R/o pneumonia (HCAP)  Assessment: Pt on Day #4 of Vanco/Cefepime for HCAP. Creat up as high as 1.9 now down to 1.7 past 24 hours. UOP now up to 1.3 ml/kg/hr. Levaquin d/c today after 3 days per PNA order admission set. WBC is now normal and patient has been afebrile for the past 24h. Vancomycin trough drawn last night resulted supratherapeutic at 24. This level is likely inaccurate due to am dose being hung late ~11 and level drawn at 1900, providing closer to an eight hr level as opposed to the desired 12h trough. Given that the patient has not fully recovered from her acute kidney injury will still conservatively redose vancomycin as accumulation would still be expected.  8/17 urine -ng 8/17 blood x2 - ngtd 8/18 cdiff pcr-negative  Goal of Therapy:  Vancomycin trough level 15-20 mcg/ml  Plan:  1.Resume vancomycin at 1250mg  IV q24 hours 2.Recheck trough at steady state or consider empiric dose increase if AKI resolves in the next 1-2 days. 3.Follow culture data and renal function for changes. 4.Continue cefepime 1g q 24h  Addendum: Vancomycin and cefepime changed to levaquin alone, which has been renally dose adjusted.  Will continue to follow Possible D/c abx soon  Vital Signs: Temp: 98.2 F (36.8 C) (08/20 0547) Temp src: Oral (08/20 0547) BP: 155/62 mmHg (08/20 1058) Pulse Rate: 74  (08/20 1058) Intake/Output from previous day: 08/19 0701 - 08/20 0700 In: 343 [P.O.:340; I.V.:3] Out: 2850 [Urine:2850] Intake/Output from this shift: Total I/O In: 54 [IV Piggyback:54] Out: 175 [Urine:175]  Labs:  Basename 05/15/12 0625 05/14/12 1652 05/14/12 0822 05/13/12 0029  WBC 7.5 -- 10.5 7.9  HGB 10.0* -- 8.7* 8.4*  PLT 247 -- 261 238  LABCREA -- -- -- --  CREATININE 1.74* 1.90* 1.86* --   Estimated Creatinine Clearance: 32 ml/min (by C-G formula based on Cr of 1.74).  Basename  05/14/12 1905  VANCOTROUGH 24.7*  VANCOPEAK --  Drue Dun --  GENTTROUGH --  GENTPEAK --  GENTRANDOM --  TOBRATROUGH --  TOBRAPEAK --  TOBRARND --  AMIKACINPEAK --  AMIKACINTROU --  AMIKACIN --     Microbiology: Recent Results (from the past 720 hour(s))  CULTURE, BLOOD (ROUTINE X 2)     Status: Normal (Preliminary result)   Collection Time   05/12/12 12:43 PM      Component Value Range Status Comment   Specimen Description BLOOD RIGHT ARM   Final    Special Requests BOTTLES DRAWN AEROBIC ONLY 6CC   Final    Culture  Setup Time 05/12/2012 16:55   Final    Culture     Final    Value:        BLOOD CULTURE RECEIVED NO GROWTH TO DATE CULTURE WILL BE HELD FOR 5 DAYS BEFORE ISSUING A FINAL NEGATIVE REPORT   Report Status PENDING   Incomplete   CULTURE, BLOOD (ROUTINE X 2)     Status: Normal (Preliminary result)   Collection Time   05/12/12 12:50 PM      Component Value Range Status Comment   Specimen Description BLOOD RIGHT ARM   Final    Special Requests BOTTLES DRAWN AEROBIC AND ANAEROBIC 10CC   Final    Culture  Setup Time 05/12/2012 16:55   Final    Culture     Final    Value:        BLOOD CULTURE RECEIVED NO GROWTH TO DATE  CULTURE WILL BE HELD FOR 5 DAYS BEFORE ISSUING A FINAL NEGATIVE REPORT   Report Status PENDING   Incomplete   URINE CULTURE     Status: Normal   Collection Time   05/12/12  1:30 PM      Component Value Range Status Comment   Specimen Description URINE, CATHETERIZED   Final    Special Requests NONE   Final    Culture  Setup Time 05/12/2012 16:55   Final    Colony Count NO GROWTH   Final    Culture NO GROWTH   Final    Report Status 05/13/2012 FINAL   Final   CLOSTRIDIUM DIFFICILE BY PCR     Status: Normal   Collection Time   05/13/12  9:58 PM      Component Value Range Status Comment   C difficile by pcr NEGATIVE  NEGATIVE Final     Anti-infectives     Start     Dose/Rate Route Frequency Ordered Stop   05/15/12 1700   levofloxacin (LEVAQUIN) IVPB  750 mg  Status:  Discontinued        750 mg 100 mL/hr over 90 Minutes Intravenous Every 48 hours 05/14/12 1110 05/14/12 1115   05/15/12 1000   ceFEPIme (MAXIPIME) 1 g in dextrose 5 % 50 mL IVPB        1 g 100 mL/hr over 30 Minutes Intravenous Every 24 hours 05/14/12 1121 05/20/12 0959   05/13/12 0600   vancomycin (VANCOCIN) 750 mg in sodium chloride 0.9 % 150 mL IVPB  Status:  Discontinued        750 mg 150 mL/hr over 60 Minutes Intravenous Every 12 hours 05/12/12 1717 05/14/12 1117   05/12/12 2200   ceFEPIme (MAXIPIME) 1 g in dextrose 5 % 50 mL IVPB  Status:  Discontinued        1 g 100 mL/hr over 30 Minutes Intravenous Every 12 hours 05/12/12 1659 05/14/12 1121   05/12/12 1700   levofloxacin (LEVAQUIN) IVPB 750 mg  Status:  Discontinued        750 mg 100 mL/hr over 90 Minutes Intravenous Every 24 hours 05/12/12 1659 05/14/12 1110   05/12/12 1530   vancomycin (VANCOCIN) IVPB 1000 mg/200 mL premix  Status:  Discontinued        1,000 mg 200 mL/hr over 60 Minutes Intravenous  Once 05/12/12 1515 05/12/12 1659   05/12/12 1530  piperacillin-tazobactam (ZOSYN) IVPB 3.375 g       3.375 g 12.5 mL/hr over 240 Minutes Intravenous  Once 05/12/12 1515 05/12/12 1927          Andrey Campanile, Dorena Cookey 05/15/2012,1:13 PM

## 2012-05-15 NOTE — Progress Notes (Signed)
Pts HR dropped to 39 on the monitor earlier this afternoon. Pt assessed. VS stable. MD text paged. It appears Dr. Laural Benes left a note about this in his note. Pt also has very red, inflamed painful vaginal area. This was also communicated to Dr. Laural Benes via text page. Will continue to monitor. Vickey Boak, Melida Quitter

## 2012-05-15 NOTE — Progress Notes (Signed)
Advanced Heart Failure Rounding Note   Subjective:    Ms. Heeg is a 78 y.o. female who resides at [facility]. Medical history includes diastolic heart failure, dementia, TIAs, temporal arteritis and chronic UTIs (had been on prophylactic antibiotics for 1 year per records).   Repeat echo 05/13/12, showed LVEF 55-60%, grade 2 diastolic dysfunction. Mod TR CT chest 05/15/12: No evidence of PNA. + edema/effusions. Procalcitonin is normal.  Nonverbal but would nod head yes or no to questions.  Agrees breathing is improved.  Slept well.  Lasix increased TID yesterday.  UOP improved.    Objective:   Weight Range:  Vital Signs:   Temp:  [98.2 F (36.8 C)-99.6 F (37.6 C)] 98.2 F (36.8 C) (08/20 0547) Pulse Rate:  [62-72] 62  (08/20 0547) Resp:  [20-22] 22  (08/20 0547) BP: (133-138)/(53-62) 136/53 mmHg (08/20 0547) SpO2:  [90 %-95 %] 93 % (08/20 0547) Weight:  [206 lb 2.1 oz (93.5 kg)] 206 lb 2.1 oz (93.5 kg) (08/20 0547) Last BM Date: 05/14/12  Weight change: Filed Weights   05/13/12 0426 05/14/12 0605 05/15/12 0547  Weight: 208 lb 12.4 oz (94.7 kg) 207 lb 3.7 oz (94 kg) 206 lb 2.1 oz (93.5 kg)    Intake/Output:   Intake/Output Summary (Last 24 hours) at 05/15/12 0858 Last data filed at 05/15/12 0546  Gross per 24 hour  Intake    343 ml  Output   2300 ml  Net  -1957 ml     Physical Exam:  General: Elderly No resp difficulty. Awake/alert. Non-verbal  HEENT: normal  Neck: supple. JVP very hard to see. Look about 6-7 . Carotids 2+ bilat; no bruits. No lymphadenopathy or thryomegaly appreciated.  Cor: PMI nondisplaced. Regular rate & rhythm. No rubs, gallops. 2/6 TR murmur  Lungs: clear Abdomen: soft, nontender, nondistended. No hepatosplenomegaly. No bruits or masses. Good bowel sounds.  Extremities: no cyanosis, clubbing, rash, tr edema  Neuro: alert & orientedx1, cranial nerves grossly intact. moves all 4 extremities w/o difficulty. Affect  pleasant   Telemetry: Sinus rhythm 60s  Labs: Basic Metabolic Panel:  Lab 05/15/12 1610 05/14/12 1652 05/14/12 0822 05/13/12 0029 05/12/12 1809 05/12/12 1232  NA 137 137 136 135 -- 138  K 4.3 4.8 4.4 4.0 -- 3.4*  CL 97 99 98 97 -- 96  CO2 30 29 28 27  -- 30  GLUCOSE 81 160* 95 128* -- 108*  BUN 32* 31* 29* 13 -- 13  CREATININE 1.74* 1.90* 1.86* 1.07 -- 0.96  CALCIUM 9.8 9.4 9.1 -- -- --  MG -- -- -- -- 1.7 --  PHOS -- -- -- -- -- --    Liver Function Tests:  Lab 05/13/12 0029 05/12/12 1232  AST 16 20  ALT 12 13  ALKPHOS 57 72  BILITOT 0.2* 0.3  PROT 6.4 7.4  ALBUMIN 2.4* 3.0*   No results found for this basename: LIPASE:5,AMYLASE:5 in the last 168 hours No results found for this basename: AMMONIA:3 in the last 168 hours  CBC:  Lab 05/15/12 0625 05/14/12 0822 05/13/12 0029 05/12/12 1232  WBC 7.5 10.5 7.9 8.2  NEUTROABS -- -- -- 7.2  HGB 10.0* 8.7* 8.4* 10.1*  HCT 32.3* 27.6* 26.5* 31.5*  MCV 95.6 95.8 94.0 95.7  PLT 247 261 238 291    Cardiac Enzymes:  Lab 05/13/12 0845 05/13/12 0037 05/12/12 1809  CKTOTAL 25 27 26   CKMB 1.5 1.6 1.8  CKMBINDEX -- -- --  TROPONINI <0.30 <0.30 <0.30  BNP: BNP (last 3 results)  Basename 05/15/12 0625 05/14/12 0826 05/13/12 0030  PROBNP 6703.0* 4323.0* 2645.0*    Imaging: Ct Head Wo Contrast  05/14/2012  *RADIOLOGY REPORT*  Clinical Data: Decreased level of consciousness, unable to speak.  CT HEAD WITHOUT CONTRAST  Technique:  Contiguous axial images were obtained from the base of the skull through the vertex without contrast.  Comparison: 03/08/2012  Findings: The patient had difficulty remaining motionless for the study.  Images are suboptimal.  Small or subtle lesions could be overlooked.  There is no evidence for acute infarction, intracranial hemorrhage, mass lesion, hydrocephalus, or extra-axial fluid. Moderate to severe atrophy is present.  Advanced chronic microvascular ischemic change is present in the  periventricular and subcortical white matter.  Calvarium intact.  Clear sinuses and mastoids.  Vascular calcification.  Unchanged from priors.  IMPRESSION: Atrophy with chronic microvascular ischemic change.  No visible acute stroke or bleed.   Original Report Authenticated By: Elsie Stain, M.D. ( 05/14/2012 08:34:44 )    Ct Chest Wo Contrast  05/14/2012  *RADIOLOGY REPORT*  Clinical Data: Shortness of breath, fever, increasing edema, CHF versus infiltrate  CT CHEST WITHOUT CONTRAST  Technique:  Multidetector CT imaging of the chest was performed following the standard protocol without IV contrast.  Comparison: Chest radiograph dated 05/14/2012  Findings: Bilateral perihilar/upper lobe interstitial opacities with associated interlobular septal thickening, compatible with interstitial edema.  No superimposed consolidation to suggest infection.  Moderate right and small to moderate left pleural effusions. Associated lower lobe compressive atelectasis.  No pneumothorax.  Cardiomegaly.  No pericardial effusion.  Coronary atherosclerosis. Atherosclerotic calcifications of the aortic arch.  Small mediastinal lymph nodes, including a 9 mm prevascular node (series 2/image 19) and a 12 mm precarinal node (series 2/image 22).  Visualized upper abdomen is unremarkable.  Mild degenerative changes of the visualized thoracolumbar spine.  IMPRESSION: Cardiomegaly with interstitial edema and moderate bilateral pleural effusions.  Associated lower lobe atelectasis.  No superimposed consolidation to suggest infection.   Original Report Authenticated By: Charline Bills, M.D.    Dg Chest Port 1 View  05/14/2012  *RADIOLOGY REPORT*  Clinical Data: Congestive heart failure exacerbation.  Cough. Vomiting.  Shortness of breath.  PORTABLE CHEST - 1 VIEW  Comparison: 05/13/2012  Findings: Artifact overlies chest.  Cardiomegaly persists. Interstitial and alveolar edema has worsened.  There is a small amount of pleural fluid.   IMPRESSION: Worsening pulmonary edema pattern.  Coexistent pneumonia is not excluded, but the findings may be explained completely by worsening congestive heart failure.   Original Report Authenticated By: Thomasenia Sales, M.D. ( 05/14/2012 07:52:34 )      Medications:     Scheduled Medications:    . ceFEPime (MAXIPIME) IV  1 g Intravenous Q24H  . cholecalciferol  2,000 Units Oral Daily  . dipyridamole-aspirin  1 capsule Oral BID  . docusate sodium  200 mg Oral Daily  . donepezil  10 mg Oral QHS  . feeding supplement  237 mL Oral BID BM  . ferrous sulfate  325 mg Oral Q breakfast  . furosemide  40 mg Intravenous TID AC  . heparin  5,000 Units Subcutaneous Q8H  . levothyroxine  150 mcg Oral Q breakfast  . metoprolol tartrate  25 mg Oral BID  . multivitamin with minerals  1 tablet Oral Daily  . pantoprazole  40 mg Oral Q1200  . predniSONE  20 mg Oral Q breakfast  . saccharomyces boulardii  250 mg Oral BID  .  sertraline  50 mg Oral Daily  . simvastatin  20 mg Oral QHS  . sodium chloride  3 mL Intravenous Q12H  . DISCONTD: ceFEPime (MAXIPIME) IV  1 g Intravenous Q12H  . DISCONTD: furosemide  60 mg Intravenous Q12H  . DISCONTD: levofloxacin (LEVAQUIN) IV  750 mg Intravenous Q24H  . DISCONTD: levofloxacin (LEVAQUIN) IV  750 mg Intravenous Q48H  . DISCONTD: potassium chloride  30 mEq Oral BID  . DISCONTD: ramipril  2.5 mg Oral Daily  . DISCONTD: vancomycin  750 mg Intravenous Q12H    Infusions:    PRN Medications: sodium chloride, acetaminophen, ondansetron (ZOFRAN) IV, polyethylene glycol, sodium chloride   Assessment:   1. Acute respiratory failure - likely multifactorial  2. Acute on Chronic diastolic HF  4. Acute delirium  5. Dementia  6. Acute renal failure - Cr. 1.0-> 1.86  7. Anemia, acute on chronic  8. DNR/DNI  Plan/Discussion:    Diuresis improved with increased lasix TID yesterday.  Cr mildly improved compared to yesterday but remains above baseline (cr  1.0).  Will continue to push diuresis with current Lasix dosing for now, follow renal function closely.    UA and blood cx remain negative.  Procalcitonin normal.  Now afebrile.  Abx per primary team.    Marca Ancona 05/15/2012 8:58 AM  Length of Stay: 3

## 2012-05-15 NOTE — Progress Notes (Signed)
[facility] Progress Note  05/15/2012   Subjective: Pt says that her breathing is a little better.  She is not eating well.  Nurse says that she is not swallowing well.   Objective:  Vital signs in last 24 hours: Filed Vitals:   05/15/12 0547 05/15/12 1058 05/15/12 1354 05/15/12 1629  BP: 136/53 155/62 122/57 137/48  Pulse: 62 74 56   Temp: 98.2 F (36.8 C)  98.4 F (36.9 C)   TempSrc: Oral  Oral   Resp: 22 24 24    Height:      Weight: 93.5 kg (206 lb 2.1 oz)     SpO2: 93% 94% 95%    Weight change: -0.5 kg (-1 lb 1.6 oz)  Intake/Output Summary (Last 24 hours) at 05/15/12 1720 Last data filed at 05/15/12 1705  Gross per 24 hour  Intake    424 ml  Output   2075 ml  Net  -1651 ml   Lab Results  Component Value Date   HGBA1C  Value: 5.8 (NOTE)                                                                       According to the ADA Clinical Practice Recommendations for 2011, when HbA1c is used as a screening test:   >=6.5%   Diagnostic of Diabetes Mellitus           (if abnormal result  is confirmed)  5.7-6.4%   Increased risk of developing Diabetes Mellitus  References:Diagnosis and Classification of Diabetes Mellitus,Diabetes Care,2011,34(Suppl 1):S62-S69 and Standards of Medical Care in         Diabetes - 2011,Diabetes Care,2011,34  (Suppl 1):S11-S61.* 06/18/2010   Lab Results  Component Value Date   LDLCALC  Value: 55        Total Cholesterol/HDL:CHD Risk Coronary Heart Disease Risk Table                     Men   Women  1/2 Average Risk   3.4   3.3  Average Risk       5.0   4.4  2 X Average Risk   9.6   7.1  3 X Average Risk  23.4   11.0        Use the calculated Patient Ratio above and the CHD Risk Table to determine the patient's CHD Risk.        ATP III CLASSIFICATION (LDL):  <100     mg/dL   Optimal  409-811  mg/dL   Near or Above                    Optimal  130-159  mg/dL   Borderline  914-782  mg/dL   High  >956     mg/dL   Very High 11/09/863   CREATININE 1.74*  05/15/2012    Review of Systems As above, otherwise all reviewed and reported negative  Physical Exam General - awake, no distress, pale, cooperative  HEENT - NCAT, MM dry Lungs - BBS, shallow with rare left basilar crackles  CV - normal s1, s2 sounds  Abd - soft, nondistended, no masses, nontender  Ext - 1+ edema bilateral LEs  Neuro - nonfocal  Lab  Results: Results for orders placed during the hospital encounter of 05/12/12 (from the past 24 hour(s))  VANCOMYCIN, TROUGH     Status: Abnormal   Collection Time   05/14/12  7:05 PM      Component Value Range   Vancomycin Tr 24.7 (*) 10.0 - 20.0 ug/mL  BASIC METABOLIC PANEL     Status: Abnormal   Collection Time   05/15/12  6:25 AM      Component Value Range   Sodium 137  135 - 145 mEq/L   Potassium 4.3  3.5 - 5.1 mEq/L   Chloride 97  96 - 112 mEq/L   CO2 30  19 - 32 mEq/L   Glucose, Bld 81  70 - 99 mg/dL   BUN 32 (*) 6 - 23 mg/dL   Creatinine, Ser 5.62 (*) 0.50 - 1.10 mg/dL   Calcium 9.8  8.4 - 13.0 mg/dL   GFR calc non Af Amer 27 (*) >90 mL/min   GFR calc Af Amer 31 (*) >90 mL/min  CBC     Status: Abnormal   Collection Time   05/15/12  6:25 AM      Component Value Range   WBC 7.5  4.0 - 10.5 K/uL   RBC 3.38 (*) 3.87 - 5.11 MIL/uL   Hemoglobin 10.0 (*) 12.0 - 15.0 g/dL   HCT 86.5 (*) 78.4 - 69.6 %   MCV 95.6  78.0 - 100.0 fL   MCH 29.6  26.0 - 34.0 pg   MCHC 31.0  30.0 - 36.0 g/dL   RDW 29.5  28.4 - 13.2 %   Platelets 247  150 - 400 K/uL  PRO B NATRIURETIC PEPTIDE     Status: Abnormal   Collection Time   05/15/12  6:25 AM      Component Value Range   Pro B Natriuretic peptide (BNP) 6703.0 (*) 0 - 450 pg/mL    Micro Results: Recent Results (from the past 240 hour(s))  CULTURE, BLOOD (ROUTINE X 2)     Status: Normal (Preliminary result)   Collection Time   05/12/12 12:43 PM      Component Value Range Status Comment   Specimen Description BLOOD RIGHT ARM   Final    Special Requests BOTTLES DRAWN AEROBIC ONLY 6CC    Final    Culture  Setup Time 05/12/2012 16:55   Final    Culture     Final    Value:        BLOOD CULTURE RECEIVED NO GROWTH TO DATE CULTURE WILL BE HELD FOR 5 DAYS BEFORE ISSUING A FINAL NEGATIVE REPORT   Report Status PENDING   Incomplete   CULTURE, BLOOD (ROUTINE X 2)     Status: Normal (Preliminary result)   Collection Time   05/12/12 12:50 PM      Component Value Range Status Comment   Specimen Description BLOOD RIGHT ARM   Final    Special Requests BOTTLES DRAWN AEROBIC AND ANAEROBIC 10CC   Final    Culture  Setup Time 05/12/2012 16:55   Final    Culture     Final    Value:        BLOOD CULTURE RECEIVED NO GROWTH TO DATE CULTURE WILL BE HELD FOR 5 DAYS BEFORE ISSUING A FINAL NEGATIVE REPORT   Report Status PENDING   Incomplete   URINE CULTURE     Status: Normal   Collection Time   05/12/12  1:30 PM      Component Value Range  Status Comment   Specimen Description URINE, CATHETERIZED   Final    Special Requests NONE   Final    Culture  Setup Time 05/12/2012 16:55   Final    Colony Count NO GROWTH   Final    Culture NO GROWTH   Final    Report Status 05/13/2012 FINAL   Final   CLOSTRIDIUM DIFFICILE BY PCR     Status: Normal   Collection Time   05/13/12  9:58 PM      Component Value Range Status Comment   C difficile by pcr NEGATIVE  NEGATIVE Final     Medications:  Scheduled Meds:   . ceFEPime (MAXIPIME) IV  1 g Intravenous Q24H  . cholecalciferol  2,000 Units Oral Daily  . dipyridamole-aspirin  1 capsule Oral BID  . docusate sodium  200 mg Oral Daily  . donepezil  10 mg Oral QHS  . feeding supplement  237 mL Oral BID BM  . ferrous sulfate  325 mg Oral Q breakfast  . furosemide  40 mg Intravenous TID AC  . heparin  5,000 Units Subcutaneous Q8H  . levothyroxine  150 mcg Oral Q breakfast  . metoprolol tartrate  25 mg Oral BID  . multivitamin with minerals  1 tablet Oral Daily  . pantoprazole  40 mg Oral Q1200  . predniSONE  20 mg Oral Q breakfast  . saccharomyces  boulardii  250 mg Oral BID  . sertraline  50 mg Oral Daily  . simvastatin  20 mg Oral QHS  . sodium chloride  3 mL Intravenous Q12H  . vancomycin  1,250 mg Intravenous Q24H   Continuous Infusions:  PRN Meds:.sodium chloride, acetaminophen, ondansetron (ZOFRAN) IV, polyethylene glycol, sodium chloride  Assessment/Plan: Acute exacerbation of chronic diastolic heart failure  -Continue CHF protocol, appreciate HF team consult, continue diuresis per HF team, monitor electrolytes, replace potassium, monitor ins and outs and daily weights, echocardiogram reveals slightly worsening diastolic HF, cardiac enzymes negative for acute myocardial ischemia  - follow BNP and clinical course;   - lasix 40 IV TID  -consulted Heart Failure team for further recommendations  -pt had a brief episode of bradycardia, may need to decrease beta blocker dose if recurs  Healthcare associated pneumonia  -The patient has been empirically started on vancomycin, cefipime, levaquin   - plan to de-escalate antibiotics tomorrow as pneumonia improving per CT chest -CT chest: Cardiomegaly with interstitial edema and moderate bilateral pleural effusions. Associated lower lobe atelectasis. No PNA reported.   Volume overload  -Diuresis ordered as above  -lasix as detailed above   Dysphagia - ordered MBS study -SLP consult  Hypoxia  -Continue supplemental oxygen support   Hypertension  - Resume home medications and follow   Hypothyroidism  -Resumed home thyroid supplement  -TSH 0.531  Temporal Arteritis - stress dosed with steroids on admission, now de-escalating to near home dosing  Generalized weakness  -Consult PT and occupational therapy for evaluation and management recommendations  - pt to  Return to SNF at discharge  Dementia  -pt with increasing confusion, likely some delerium is a component of the increasing confusion  - CT head neg for acute findings   Anemia-chronic  - Monitor closely with  serial hemoglobin tests   LOS: 3 days   [name] 05/15/2012   Cleora Fleet, MD, CDE, FAAFP [facility] [place], [place]  [phone]

## 2012-05-16 ENCOUNTER — Inpatient Hospital Stay (HOSPITAL_COMMUNITY): Payer: Medicare Other

## 2012-05-16 DIAGNOSIS — F329 Major depressive disorder, single episode, unspecified: Secondary | ICD-10-CM

## 2012-05-16 DIAGNOSIS — N390 Urinary tract infection, site not specified: Secondary | ICD-10-CM

## 2012-05-16 DIAGNOSIS — F0390 Unspecified dementia without behavioral disturbance: Secondary | ICD-10-CM

## 2012-05-16 DIAGNOSIS — E785 Hyperlipidemia, unspecified: Secondary | ICD-10-CM

## 2012-05-16 LAB — BASIC METABOLIC PANEL
BUN: 34 mg/dL — ABNORMAL HIGH (ref 6–23)
CO2: 32 mEq/L (ref 19–32)
Calcium: 9.4 mg/dL (ref 8.4–10.5)
Creatinine, Ser: 1.69 mg/dL — ABNORMAL HIGH (ref 0.50–1.10)
GFR calc Af Amer: 32 mL/min — ABNORMAL LOW (ref >90)

## 2012-05-16 LAB — CBC
MCHC: 33.5 g/dL (ref 30.0–36.0)
MCV: 95.4 fL (ref 78.0–100.0)
Platelets: 238 10*3/uL (ref 150–400)
RDW: 14.4 % (ref 11.5–15.5)
WBC: 8.7 10*3/uL (ref 4.0–10.5)

## 2012-05-16 MED ORDER — FUROSEMIDE 10 MG/ML IJ SOLN
40.0000 mg | Freq: Three times a day (TID) | INTRAMUSCULAR | Status: DC
  Administered 2012-05-16 – 2012-05-17 (×3): 40 mg via INTRAVENOUS
  Filled 2012-05-16 (×3): qty 4

## 2012-05-16 MED ORDER — FUROSEMIDE 10 MG/ML IJ SOLN: 40.0000 mg | Freq: Two times a day (BID) | INTRAMUSCULAR | Status: DC

## 2012-05-16 NOTE — Progress Notes (Signed)
[facility] PROGRESS NOTE  [name]:[id number] DOB: [birth year] DOA: 05/12/2012 PCP: Cain Saupe, MD  Assessment/Plan: Principal Problem:  *Chest pain Active Problems:  Generalized weakness  Chronic UTI  Hypothyroidism  Hyperlipidemia  Depression  Dementia  Chronic steroid use  Chronic anemia  Cardiomegaly - hypertensive  Diastolic CHF  S/P rotator cuff surgery  Temporal arteritis  Hypertension  Peripheral neuropathy  Acute exacerbation of CHF (congestive heart failure)  Volume overload  Bilateral leg edema  Hypoxia  Dependence on supplemental oxygen  Left ventricular hypertrophy due to hypertensive disease  Acute respiratory failure  Acute on chronic diastolic heart failure  Acute renal failure  Acute exacerbation of chronic diastolic heart failure  -Continue CHF protocol, appreciate HF team consult, continue diuresis per HF team, monitor electrolytes, replace potassium, monitor ins and outs and daily weights, echocardiogram reveals slightly worsening diastolic HF, cardiac enzymes negative for acute myocardial ischemia  - follow BNP and clinical course;  - lasix 40 IV TID  -consulted Heart Failure team for further recommendations, will defer further management to them and follow up on their recommendations  Healthcare associated pneumonia  -The patient has been empirically started on levaquin  -CT chest: Cardiomegaly with interstitial edema and moderate bilateral pleural effusions. Associated lower lobe atelectasis. No PNA reported.   Volume overload  -Diuresis ordered as above   Dysphagia  - ordered MBS study  -SLP consult   Hypoxia  -Continue supplemental oxygen support   Hypertension  - Resume home medications and follow   Hypothyroidism  -Resumed home thyroid supplement  -TSH 0.531   Temporal Arteritis  - stress dosed with steroids on admission, now de-escalating to near home dosing   Generalized weakness  -Consult PT and  occupational therapy for evaluation and management recommendations  - pt to Return to SNF at discharge   Dementia  -pt with increasing confusion, likely some delerium is a component of the increasing confusion  - CT head neg for acute findings   Anemia-chronic  - Monitor closely with serial hemoglobin tests  Code Status: DNR Family Communication: Family at bedside Disposition Plan: Pending clinical improvement in condition.   Brief narrative: Please see HPI  Consultants:  Heart Failure team  Procedures:  None  Antibiotics:  Levaquin  HPI/Subjective: Pt feels better.  No acute complaints today.  Objective: Filed Vitals:   05/15/12 1629 05/15/12 2115 05/16/12 0651 05/16/12 1021  BP: 137/48 110/43 150/50 135/59  Pulse:  59 67 59  Temp:  98 F (36.7 C) 98.4 F (36.9 C)   TempSrc:  Oral Oral   Resp:  20 18 20   Height:      Weight:   88.4 kg (194 lb 14.2 oz)   SpO2:  100% 91% 98%    Intake/Output Summary (Last 24 hours) at 05/16/12 2031 Last data filed at 05/16/12 1855  Gross per 24 hour  Intake    360 ml  Output   2450 ml  Net  -2090 ml   Filed Weights   05/14/12 0605 05/15/12 0547 05/16/12 0651  Weight: 94 kg (207 lb 3.7 oz) 93.5 kg (206 lb 2.1 oz) 88.4 kg (194 lb 14.2 oz)    Exam:   General:  Pt in NAD, Alert and awake  Cardiovascular: RRR, No murmurs rubs or gallops  Respiratory: Decreased breath sounds at bases, no wheezes  Abdomen: Soft, NT, ND  Data Reviewed: Basic Metabolic Panel:  Lab 05/16/12 4401 05/15/12 0272 05/14/12 1652 05/14/12 5366 05/13/12 0029 05/12/12 1809  NA 137 137 137 136 135 --  K 3.9 4.3 4.8 4.4 4.0 --  CL 97 97 99 98 97 --  CO2 32 30 29 28 27  --  GLUCOSE 88 81 160* 95 128* --  BUN 34* 32* 31* 29* 13 --  CREATININE 1.69* 1.74* 1.90* 1.86* 1.07 --  CALCIUM 9.4 9.8 9.4 9.1 8.4 --  MG -- -- -- -- -- 1.7  PHOS -- -- -- -- -- --   Liver Function Tests:  Lab 05/13/12 0029 05/12/12 1232  AST 16 20  ALT 12 13    ALKPHOS 57 72  BILITOT 0.2* 0.3  PROT 6.4 7.4  ALBUMIN 2.4* 3.0*   No results found for this basename: LIPASE:5,AMYLASE:5 in the last 168 hours No results found for this basename: AMMONIA:5 in the last 168 hours CBC:  Lab 05/16/12 0610 05/15/12 0625 05/14/12 0822 05/13/12 0029 05/12/12 1232  WBC 8.7 7.5 10.5 7.9 8.2  NEUTROABS -- -- -- -- 7.2  HGB 9.1* 10.0* 8.7* 8.4* 10.1*  HCT 27.2* 32.3* 27.6* 26.5* 31.5*  MCV 95.4 95.6 95.8 94.0 95.7  PLT 238 247 261 238 291   Cardiac Enzymes:  Lab 05/13/12 0845 05/13/12 0037 05/12/12 1809  CKTOTAL 25 27 26   CKMB 1.5 1.6 1.8  CKMBINDEX -- -- --  TROPONINI <0.30 <0.30 <0.30   BNP (last 3 results)  Basename 05/16/12 0610 05/15/12 0625 05/14/12 0826  PROBNP 4914.0* 6703.0* 4323.0*   CBG:  Lab 05/14/12 0828 05/14/12 0710  GLUCAP 99 105*    Recent Results (from the past 240 hour(s))  CULTURE, BLOOD (ROUTINE X 2)     Status: Normal (Preliminary result)   Collection Time   05/12/12 12:43 PM      Component Value Range Status Comment   Specimen Description BLOOD RIGHT ARM   Final    Special Requests BOTTLES DRAWN AEROBIC ONLY 6CC   Final    Culture  Setup Time 05/12/2012 16:55   Final    Culture     Final    Value:        BLOOD CULTURE RECEIVED NO GROWTH TO DATE CULTURE WILL BE HELD FOR 5 DAYS BEFORE ISSUING A FINAL NEGATIVE REPORT   Report Status PENDING   Incomplete   CULTURE, BLOOD (ROUTINE X 2)     Status: Normal (Preliminary result)   Collection Time   05/12/12 12:50 PM      Component Value Range Status Comment   Specimen Description BLOOD RIGHT ARM   Final    Special Requests BOTTLES DRAWN AEROBIC AND ANAEROBIC 10CC   Final    Culture  Setup Time 05/12/2012 16:55   Final    Culture     Final    Value:        BLOOD CULTURE RECEIVED NO GROWTH TO DATE CULTURE WILL BE HELD FOR 5 DAYS BEFORE ISSUING A FINAL NEGATIVE REPORT   Report Status PENDING   Incomplete   URINE CULTURE     Status: Normal   Collection Time   05/12/12  1:30  PM      Component Value Range Status Comment   Specimen Description URINE, CATHETERIZED   Final    Special Requests NONE   Final    Culture  Setup Time 05/12/2012 16:55   Final    Colony Count NO GROWTH   Final    Culture NO GROWTH   Final    Report Status 05/13/2012 FINAL   Final   CLOSTRIDIUM DIFFICILE BY  PCR     Status: Normal   Collection Time   05/13/12  9:58 PM      Component Value Range Status Comment   C difficile by pcr NEGATIVE  NEGATIVE Final      Studies: Ct Head Wo Contrast  05/14/2012  *RADIOLOGY REPORT*  Clinical Data: Decreased level of consciousness, unable to speak.  CT HEAD WITHOUT CONTRAST  Technique:  Contiguous axial images were obtained from the base of the skull through the vertex without contrast.  Comparison: 03/08/2012  Findings: The patient had difficulty remaining motionless for the study.  Images are suboptimal.  Small or subtle lesions could be overlooked.  There is no evidence for acute infarction, intracranial hemorrhage, mass lesion, hydrocephalus, or extra-axial fluid. Moderate to severe atrophy is present.  Advanced chronic microvascular ischemic change is present in the periventricular and subcortical white matter.  Calvarium intact.  Clear sinuses and mastoids.  Vascular calcification.  Unchanged from priors.  IMPRESSION: Atrophy with chronic microvascular ischemic change.  No visible acute stroke or bleed.   Original Report Authenticated By: Elsie Stain, M.D. ( 05/14/2012 08:34:44 )    Ct Chest Wo Contrast  05/14/2012  *RADIOLOGY REPORT*  Clinical Data: Shortness of breath, fever, increasing edema, CHF versus infiltrate  CT CHEST WITHOUT CONTRAST  Technique:  Multidetector CT imaging of the chest was performed following the standard protocol without IV contrast.  Comparison: Chest radiograph dated 05/14/2012  Findings: Bilateral perihilar/upper lobe interstitial opacities with associated interlobular septal thickening, compatible with interstitial edema.   No superimposed consolidation to suggest infection.  Moderate right and small to moderate left pleural effusions. Associated lower lobe compressive atelectasis.  No pneumothorax.  Cardiomegaly.  No pericardial effusion.  Coronary atherosclerosis. Atherosclerotic calcifications of the aortic arch.  Small mediastinal lymph nodes, including a 9 mm prevascular node (series 2/image 19) and a 12 mm precarinal node (series 2/image 22).  Visualized upper abdomen is unremarkable.  Mild degenerative changes of the visualized thoracolumbar spine.  IMPRESSION: Cardiomegaly with interstitial edema and moderate bilateral pleural effusions.  Associated lower lobe atelectasis.  No superimposed consolidation to suggest infection.   Original Report Authenticated By: Charline Bills, M.D.    Dg Chest Port 1 View  05/14/2012  *RADIOLOGY REPORT*  Clinical Data: Congestive heart failure exacerbation.  Cough. Vomiting.  Shortness of breath.  PORTABLE CHEST - 1 VIEW  Comparison: 05/13/2012  Findings: Artifact overlies chest.  Cardiomegaly persists. Interstitial and alveolar edema has worsened.  There is a small amount of pleural fluid.  IMPRESSION: Worsening pulmonary edema pattern.  Coexistent pneumonia is not excluded, but the findings may be explained completely by worsening congestive heart failure.   Original Report Authenticated By: Thomasenia Sales, M.D. ( 05/14/2012 07:52:34 )    Dg Chest Port 1 View  05/13/2012  *RADIOLOGY REPORT*  Clinical Data: CHF  PORTABLE CHEST - 1 VIEW  Comparison: Yesterday  Findings: Increasing edema which is moderate and predominately interstitial.  Patchy airspace disease at the lung bases has increased likely related to edema.  Mild cardiomegaly.  No pneumothorax.  IMPRESSION: Increasing edema and CHF.  Original Report Authenticated By: Donavan Burnet, M.D.   Dg Chest Port 1 View  05/12/2012  *RADIOLOGY REPORT*  Clinical Data: Weakness, shortness of breath.  Chest pain.  PORTABLE CHEST - 1 VIEW   Comparison: 03/08/2012  Findings: Heart is enlarged.  There is pulmonary vascular congestion.  Prominent interstitial markings are consistent with mild interstitial edema.  Left pleural effusion is present.  No focal consolidations are identified.  IMPRESSION:  1.  Cardiomegaly and changes of interstitial edema. 2.  Left pleural effusion.  Original Report Authenticated By: Patterson Hammersmith, M.D.   Dg Swallowing Func-no Report  05/16/2012  CLINICAL DATA: objectively evaluate swallow function   FLUOROSCOPY FOR SWALLOWING FUNCTION STUDY:  Fluoroscopy was provided for swallowing function study, which was  administered by a speech pathologist.  Final results and recommendations  from this study are contained within the speech pathology report.      Scheduled Meds:   . cholecalciferol  2,000 Units Oral Daily  . dipyridamole-aspirin  1 capsule Oral BID  . docusate sodium  200 mg Oral Daily  . donepezil  10 mg Oral QHS  . feeding supplement  237 mL Oral BID BM  . ferrous sulfate  325 mg Oral Q breakfast  . fluconazole  150 mg Oral Once  . furosemide  40 mg Intravenous Q8H  . heparin  5,000 Units Subcutaneous Q8H  . levofloxacin (LEVAQUIN) IV  750 mg Intravenous Q48H  . levothyroxine  150 mcg Oral Q breakfast  . metoprolol tartrate  25 mg Oral BID  . multivitamin with minerals  1 tablet Oral Daily  . pantoprazole  40 mg Oral Q1200  . predniSONE  10 mg Oral Q breakfast  . saccharomyces boulardii  250 mg Oral BID  . sertraline  50 mg Oral Daily  . simvastatin  20 mg Oral QHS  . sodium chloride  3 mL Intravenous Q12H  . DISCONTD: furosemide  40 mg Intravenous TID AC  . DISCONTD: furosemide  40 mg Intravenous BID   Continuous Infusions:   Principal Problem:  *Chest pain Active Problems:  Generalized weakness  Chronic UTI  Hypothyroidism  Hyperlipidemia  Depression  Dementia  Chronic steroid use  Chronic anemia  Cardiomegaly - hypertensive  Diastolic CHF  S/P rotator cuff surgery   Temporal arteritis  Hypertension  Peripheral neuropathy  Acute exacerbation of CHF (congestive heart failure)  Volume overload  Bilateral leg edema  Hypoxia  Dependence on supplemental oxygen  Left ventricular hypertrophy due to hypertensive disease  Acute respiratory failure  Acute on chronic diastolic heart failure  Acute renal failure    Time spent: > 30 minutes    Penny Pia  [facility] Pager [phone] If 8PM-8AM, please contact night-coverage at [email or web address], password [facility] 05/16/2012, 8:31 PM  LOS: 4 days

## 2012-05-16 NOTE — Procedures (Signed)
Objective Swallowing Evaluation: Modified Barium Swallowing Study  Patient Details  Name: [name] MRN: [id number] Date of Birth: [birth year]  Today's Date: 05/16/2012 Time: 0945-1000 SLP Time Calculation (min): 15 min  Past Medical History:  Past Medical History  Diagnosis Date  . Peripheral neuropathy   . Hypertension   . TIA (transient ischemic attack)   . Elevated sed rate   . Temporal arteritis     chronic steroids  . Hypothyroidism   . Colon cancer     colon  . History of hernia repair   . S/P rotator cuff surgery     right  . Diastolic CHF   . Cardiomegaly - hypertensive   . Chronic anemia   . Chronic steroid use   . Dementia   . Shortness of breath   . Hyperlipidemia   . CHF (congestive heart failure)   . Cardiomyopathy due to hypertension   . Dependence on supplemental oxygen   . Chronic UTI    Past Surgical History:  Past Surgical History  Procedure Date  . Tonsillectomy   . Appendectomy   . Abdominal hysterectomy   . Colectomy    HPI:  Harrison Karen is an 78 y.o. female resident of nursing home with dementia, chronic diastolic CHF was sent to ER by primary care provider with complaints of persistent shortness of breath requiring additional supplemental oxygen. Pt found to have volume overload with pna. On 05/14/12 pt had increased confusion, febrile. Pt demonstrated clinical signs ofa spraition at bedside. MBS warranted to determine safety with PO.      Assessment / Plan / Recommendation Clinical Impression  Dysphagia Diagnosis: Within Functional Limits Clinical impression: Pt presents with an overall functional oral and oropharyngeal swallow. Cognitive deficit leads to momentary holding of bolus in oral cavity prior to transit though pt demonstrates adequate bolus containment and timing of swallow response. No penetration or aspiration observed during this study. Given presentation at bedside (hard cough with water) suspect intermittent  aspiration events, though pt likely protecteed airway with strong cough response. No SLP f/u needed at this time as pt is [facility].     Treatment Recommendation  No treatment recommended at this time    Diet Recommendation Regular;Thin liquid   Liquid Administration via: Cup;Straw Medication Administration: Whole meds with liquid Supervision: Patient able to self feed;Intermittent supervision to cue for compensatory strategies Compensations: Slow rate;Small sips/bites Postural Changes and/or Swallow Maneuvers: Seated upright 90 degrees    Other  Recommendations Oral Care Recommendations: Oral care BID   Follow Up Recommendations  None    Frequency and Duration        Pertinent Vitals/Pain NA    SLP Swallow Goals     General HPI: [name] is an 78 y.o. female resident of nursing home with dementia, chronic diastolic CHF was sent to ER by primary care provider with complaints of persistent shortness of breath requiring additional supplemental oxygen. Pt found to have volume overload with pna. On 05/14/12 pt had increased confusion, febrile. Pt demonstrated clinical signs ofa spraition at bedside. MBS warranted to determine safety with PO.  Type of Study: Modified Barium Swallowing Study Reason for Referral: Objectively evaluate swallowing function Diet Prior to this Study: Dysphagia 3 (soft);Nectar-thick liquids Temperature Spikes Noted: No Respiratory Status: Supplemental O2 delivered via (comment) History of Recent Intubation: No Behavior/Cognition: Alert;Cooperative;Pleasant mood;Confused Oral Cavity - Dentition: Adequate natural dentition Oral Motor / Sensory Function: Within functional limits Self-Feeding Abilities: Able to feed self Patient Positioning:  Upright in chair Baseline Vocal Quality: Clear Volitional Cough: Strong Volitional Swallow: Able to elicit Anatomy: Within functional limits Pharyngeal Secretions: Not observed secondary MBS    Reason for Referral  Objectively evaluate swallowing function   Oral Phase     Pharyngeal Phase Pharyngeal Phase: Within functional limits   Cervical Esophageal Phase    GO    Cervical Esophageal Phase: Impaired   Harlon Ditty, MA CCC-SLP [phone]  Claudine Mouton 05/16/2012, 11:03 AM

## 2012-05-16 NOTE — ED Notes (Signed)
CSW spoke to ALF Resident Care Coordinator, Charlton Amor, re: PT Eval and assistance needed at ALF. Sherrie reported that they will need to come out and assess pt before she can return to ALF. CSW faxed PT/SLT evals and last progress note from MD. CSW will f/u with facility as to their decision.    Frederico Hamman, LCSW  Covering CSW for [employer], [place]

## 2012-05-16 NOTE — Progress Notes (Signed)
Advanced Heart Failure Rounding Note   Subjective:    [name] is a 78 y.o. female who resides at [facility]. Medical history includes diastolic heart failure, dementia, TIAs, temporal arteritis and chronic UTIs (had been on prophylactic antibiotics for 1 year per records).   Repeat echo 05/13/12, showed LVEF 55-60%, grade 2 diastolic dysfunction. Mod TR CT chest 05/15/12: No evidence of PNA. + edema/effusions. Procalcitonin is normal.  Mood improved.  Not happy to be here.  Breathing is good.  Ate all breakfast.  PT working with her this morning.   Weight down 12 pounds per chart, reweigh 196.9 in bed.     Objective:   Weight Range:  Vital Signs:   Temp:  [98 F (36.7 C)-98.4 F (36.9 C)] 98.4 F (36.9 C) (08/21 0651) Pulse Rate:  [56-74] 59  (08/21 1021) Resp:  [18-24] 20  (08/21 1021) BP: (110-155)/(43-62) 135/59 mmHg (08/21 1021) SpO2:  [91 %-100 %] 98 % (08/21 1021) Weight:  [194 lb 14.2 oz (88.4 kg)] 194 lb 14.2 oz (88.4 kg) (08/21 0651) Last BM Date: 05/15/12  Weight change: Filed Weights   05/14/12 0605 05/15/12 0547 05/16/12 0651  Weight: 207 lb 3.7 oz (94 kg) 206 lb 2.1 oz (93.5 kg) 194 lb 14.2 oz (88.4 kg)    Intake/Output:   Intake/Output Summary (Last 24 hours) at 05/16/12 1043 Last data filed at 05/16/12 0843  Gross per 24 hour  Intake    508 ml  Output   2200 ml  Net  -1692 ml     Physical Exam:  General: Elderly No resp difficulty. Awake/alert. Non-verbal  HEENT: normal  Neck: supple. JVP very hard to see. Look about 8-9 . Carotids 2+ bilat; no bruits. No lymphadenopathy or thryomegaly appreciated.  Cor: PMI nondisplaced. Regular rate & rhythm. No rubs, gallops. 2/6 TR murmur. 1+ ankle edema. Lungs: clear Abdomen: soft, nontender, nondistended. No hepatosplenomegaly. No bruits or masses. Good bowel sounds.  Extremities: no cyanosis, clubbing, rash. Neuro: alert & orientedx1, cranial nerves grossly intact. moves all 4  extremities w/o difficulty. Affect pleasant   Telemetry: Sinus rhythm 60s  Labs: Basic Metabolic Panel:  Lab 05/16/12 4098 05/15/12 0625 05/14/12 1652 05/14/12 0822 05/13/12 0029 05/12/12 1809  NA 137 137 137 136 135 --  K 3.9 4.3 4.8 4.4 4.0 --  CL 97 97 99 98 97 --  CO2 32 30 29 28 27  --  GLUCOSE 88 81 160* 95 128* --  BUN 34* 32* 31* 29* 13 --  CREATININE 1.69* 1.74* 1.90* 1.86* 1.07 --  CALCIUM 9.4 9.8 9.4 -- -- --  MG -- -- -- -- -- 1.7  PHOS -- -- -- -- -- --    Liver Function Tests:  Lab 05/13/12 0029 05/12/12 1232  AST 16 20  ALT 12 13  ALKPHOS 57 72  BILITOT 0.2* 0.3  PROT 6.4 7.4  ALBUMIN 2.4* 3.0*   No results found for this basename: LIPASE:5,AMYLASE:5 in the last 168 hours No results found for this basename: AMMONIA:3 in the last 168 hours  CBC:  Lab 05/16/12 0610 05/15/12 0625 05/14/12 0822 05/13/12 0029 05/12/12 1232  WBC 8.7 7.5 10.5 7.9 8.2  NEUTROABS -- -- -- -- 7.2  HGB 9.1* 10.0* 8.7* 8.4* 10.1*  HCT 27.2* 32.3* 27.6* 26.5* 31.5*  MCV 95.4 95.6 95.8 94.0 95.7  PLT 238 247 261 238 291    Cardiac Enzymes:  Lab 05/13/12 0845 05/13/12 0037 05/12/12 1809  CKTOTAL 25 27 26  CKMB 1.5 1.6 1.8  CKMBINDEX -- -- --  TROPONINI <0.30 <0.30 <0.30    BNP: BNP (last 3 results)  Basename 05/16/12 0610 05/15/12 0625 05/14/12 0826  PROBNP 4914.0* 6703.0* 4323.0*    Imaging: Ct Chest Wo Contrast  05/14/2012  *RADIOLOGY REPORT*  Clinical Data: Shortness of breath, fever, increasing edema, CHF versus infiltrate  CT CHEST WITHOUT CONTRAST  Technique:  Multidetector CT imaging of the chest was performed following the standard protocol without IV contrast.  Comparison: Chest radiograph dated 05/14/2012  Findings: Bilateral perihilar/upper lobe interstitial opacities with associated interlobular septal thickening, compatible with interstitial edema.  No superimposed consolidation to suggest infection.  Moderate right and small to moderate left pleural  effusions. Associated lower lobe compressive atelectasis.  No pneumothorax.  Cardiomegaly.  No pericardial effusion.  Coronary atherosclerosis. Atherosclerotic calcifications of the aortic arch.  Small mediastinal lymph nodes, including a 9 mm prevascular node (series 2/image 19) and a 12 mm precarinal node (series 2/image 22).  Visualized upper abdomen is unremarkable.  Mild degenerative changes of the visualized thoracolumbar spine.  IMPRESSION: Cardiomegaly with interstitial edema and moderate bilateral pleural effusions.  Associated lower lobe atelectasis.  No superimposed consolidation to suggest infection.   Original Report Authenticated By: Charline Bills, M.D.      Medications:     Scheduled Medications:    . cholecalciferol  2,000 Units Oral Daily  . dipyridamole-aspirin  1 capsule Oral BID  . docusate sodium  200 mg Oral Daily  . donepezil  10 mg Oral QHS  . feeding supplement  237 mL Oral BID BM  . ferrous sulfate  325 mg Oral Q breakfast  . fluconazole  150 mg Oral Once  . furosemide  40 mg Intravenous BID  . heparin  5,000 Units Subcutaneous Q8H  . levofloxacin (LEVAQUIN) IV  750 mg Intravenous Q48H  . levothyroxine  150 mcg Oral Q breakfast  . metoprolol tartrate  25 mg Oral BID  . multivitamin with minerals  1 tablet Oral Daily  . pantoprazole  40 mg Oral Q1200  . predniSONE  10 mg Oral Q breakfast  . saccharomyces boulardii  250 mg Oral BID  . sertraline  50 mg Oral Daily  . simvastatin  20 mg Oral QHS  . sodium chloride  3 mL Intravenous Q12H  . DISCONTD: ceFEPime (MAXIPIME) IV  1 g Intravenous Q24H  . DISCONTD: furosemide  40 mg Intravenous TID AC  . DISCONTD: predniSONE  20 mg Oral Q breakfast  . DISCONTD: vancomycin  1,250 mg Intravenous Q24H    Infusions:    PRN Medications: sodium chloride, acetaminophen, ondansetron (ZOFRAN) IV, polyethylene glycol, sodium chloride   Assessment:   1. Acute respiratory failure - likely multifactorial  2. Acute on  Chronic diastolic HF  4. Acute delirium  5. Dementia  6. Acute renal failure - Cr. 1.0-> 1.86  7. Anemia, acute on chronic  8. DNR/DNI  Plan/Discussion:    Diuresing on lasix IV 40 mg TID.  Weight deceasing.  Cr continues to improve but remains above baseline (cr 1.0).  Continue current Lasix dose for 1 more day then likely wean down.  Watch renal function closely.    Abx per primary team.    Marca Ancona 05/16/2012 10:45 AM  Length of Stay: 4

## 2012-05-16 NOTE — Evaluation (Signed)
Physical Therapy Evaluation Patient Details Name: [name] MRN: [id number] DOB: 1930-07-02 Today's Date: 05/16/2012 Time: 4098-1191 PT Time Calculation (min): 36 min  PT Assessment / Plan / Recommendation Clinical Impression  Patient s/p CP with decr mobility secondary to decr endurance for activity.  Patient reports her A living helps her any way she needs.  If so, HHPT at A living.  If they only provide supervision, patient will need to go to a NH and then back to A living.      PT Assessment  Patient needs continued PT services    Follow Up Recommendations  Skilled nursing facility;Supervision/Assistance - 24 hour (HHPT if patient has 24 hour care at A living)    Barriers to Discharge        Equipment Recommendations  None recommended by PT    Recommendations for Other Services     Frequency Min 3X/week    Precautions / Restrictions Precautions Precautions: Fall Restrictions Weight Bearing Restrictions: No   Pertinent Vitals/Pain VSS, No pain      Mobility  Bed Mobility Bed Mobility: Rolling Left;Left Sidelying to Sit;Sitting - Scoot to [place] of Bed Rolling Left: 4: Min assist;With rail Left Sidelying to Sit: 4: Min assist;With rails;HOB elevated Sitting - Scoot to Edge of Bed: 3: Mod assist Details for Bed Mobility Assistance: needed to use pad to scoot her to EOB. Transfers Transfers: Sit to Stand;Stand to [employer] Transfers Sit to Stand: 4: Min assist;With upper extremity assist;From bed Stand to Sit: 4: Min assist;With upper extremity assist;With armrests;To chair/3-in-1 Stand Pivot Transfers: 4: Min assist Details for Transfer Assistance: Patient needed HHA for transfer to chair.  Needed steadying assist as patient can weight bear but is unsteady on her feet.  Had difficulty achieving full upright stance with knees and hips slightly flexed.   Ambulation/Gait Ambulation/Gait Assistance: Not tested (comment) Assistive device: None Stairs:  No Wheelchair Mobility Wheelchair Mobility: No         PT Diagnosis: Generalized weakness  PT Problem List: Decreased activity tolerance;Decreased balance;Decreased mobility;Decreased safety awareness;Decreased knowledge of use of DME PT Treatment Interventions: DME instruction;Gait training;Functional mobility training;Therapeutic activities;Therapeutic exercise;Balance training;Patient/family education   PT Goals Acute Rehab PT Goals PT Goal Formulation: With patient Time For Goal Achievement: 05/30/12 Potential to Achieve Goals: Good Pt will go Supine/Side to Sit: with modified independence;with rail PT Goal: Supine/Side to Sit - Progress: Goal set today Pt will go Sit to Stand: with supervision;with upper extremity assist PT Goal: Sit to Stand - Progress: Goal set today Pt will Transfer Bed to Chair/Chair to Bed: with supervision PT Transfer Goal: Bed to Chair/Chair to Bed - Progress: Goal set today Pt will Ambulate: 16 - 50 feet;with min assist;with least restrictive assistive device PT Goal: Ambulate - Progress: Goal set today  Visit Information  Last PT Received On: 05/16/12 Assistance Needed: +2 (for ambulation)    Subjective Data  Subjective: "I will try." Patient Stated Goal: To go back to A living   Prior Functioning  Home Living Lives With: Alone Available Help at Discharge: Available PRN/intermittently Type of Home: Assisted living Home Access: Level entry Home Layout: One level Bathroom Shower/Tub: Health visitor: Standard Home Adaptive Equipment: Walker - rolling;Bedside commode/3-in-1;Tub transfer bench Additional Comments: Patient states that facility helps her with whatever she needs. Prior Function Level of Independence: Needs assistance Needs Assistance: Bathing;Dressing;Toileting;Gait Bath: Maximal Dressing: Maximal Toileting: Moderate Gait Assistance: Ambulated with RW with staff assist  Able to Take Stairs?: No Driving:  No Vocation: Retired Musician: No difficulties Dominant Hand: Right    Cognition  Overall Cognitive Status: Impaired Area of Impairment: Attention;Memory;Following commands;Safety/judgement;Awareness of errors;Awareness of deficits;Problem solving Arousal/Alertness: Awake/alert Orientation Level: Appears intact for tasks assessed;Disoriented to;Time Behavior During Session: [facility] for tasks performed Current Attention Level: Focused Memory Deficits: Needed incr time to answer questions Following Commands: Follows one step commands inconsistently;Follows one step commands with increased time Safety/Judgement: Decreased awareness of safety precautions;Decreased safety judgement for tasks assessed;Decreased awareness of need for assistance Awareness of Errors: Assistance required to identify errors made;Assistance required to correct errors made    Extremity/Trunk Assessment Right Lower Extremity Assessment RLE ROM/Strength/Tone: [facility] for tasks assessed Left Lower Extremity Assessment LLE ROM/Strength/Tone: [facility] for tasks assessed Trunk Assessment Trunk Assessment: Kyphotic   Balance Static Standing Balance Static Standing - Balance Support: Bilateral upper extremity supported;During functional activity Static Standing - Level of Assistance: 4: Min assist Static Standing - Comment/# of Minutes: 1 minute with support at bil UES  End of Session PT - End of Session Equipment Utilized During Treatment: Gait belt Activity Tolerance: Patient limited by fatigue Patient left: in chair;with call bell/phone within reach Nurse Communication: Mobility status       INGOLD,Cosima Prentiss 05/16/2012, 11:24 AM  [facility] [phone] (pager)

## 2012-05-17 ENCOUNTER — Encounter (HOSPITAL_COMMUNITY): Payer: Self-pay | Admitting: General Practice

## 2012-05-17 DIAGNOSIS — I10 Essential (primary) hypertension: Secondary | ICD-10-CM

## 2012-05-17 LAB — SODIUM, URINE, RANDOM: Sodium, Ur: 127 mEq/L

## 2012-05-17 LAB — GLUCOSE, CAPILLARY
Glucose-Capillary: 90 mg/dL (ref 70–99)
Glucose-Capillary: 127 mg/dL — ABNORMAL HIGH (ref 70–99)

## 2012-05-17 LAB — BASIC METABOLIC PANEL
BUN: 32 mg/dL — ABNORMAL HIGH (ref 6–23)
CO2: 34 mEq/L — ABNORMAL HIGH (ref 19–32)
Calcium: 10.1 mg/dL (ref 8.4–10.5)
Chloride: 93 mEq/L — ABNORMAL LOW (ref 96–112)
Creatinine, Ser: 1.74 mg/dL — ABNORMAL HIGH (ref 0.50–1.10)

## 2012-05-17 MED ORDER — AMLODIPINE BESYLATE 2.5 MG PO TABS: 2.5000 mg | ORAL_TABLET | Freq: Every day | ORAL | Status: DC

## 2012-05-17 MED ORDER — AMLODIPINE BESYLATE 5 MG PO TABS
5.0000 mg | ORAL_TABLET | Freq: Every day | ORAL | Status: DC
  Filled 2012-05-17: qty 1

## 2012-05-17 MED ORDER — SACCHAROMYCES BOULARDII 250 MG PO CAPS: 250.0000 mg | ORAL_CAPSULE | Freq: Two times a day (BID) | ORAL | Status: AC

## 2012-05-17 MED ORDER — AMLODIPINE BESYLATE 2.5 MG PO TABS
2.5000 mg | ORAL_TABLET | Freq: Every day | ORAL | Status: DC
  Administered 2012-05-17: 2.5 mg via ORAL
  Filled 2012-05-17: qty 1

## 2012-05-17 MED ORDER — LOPERAMIDE HCL 2 MG PO CAPS
2.0000 mg | ORAL_CAPSULE | ORAL | Status: DC | PRN
  Administered 2012-05-17: 2 mg via ORAL
  Filled 2012-05-17: qty 1

## 2012-05-17 NOTE — Care Management Note (Signed)
    Page 1 of 2   05/17/2012     12:07:16 PM   CARE MANAGEMENT NOTE 05/17/2012  Patient:  [name], [name]   Account Number:  [id number]  Date Initiated:  05/17/2012  Documentation initiated by:  Tera Mater  Subjective/Objective Assessment:   78yo female admitted from [facility], independent living facility, with CHF.     Action/Plan:   Discharge planning   Anticipated DC Date:  05/17/2012   Anticipated DC Plan:  HOME W HOME HEALTH SERVICES      DC Planning Services  DC out of service area      [facility] Choice  Resumption Of Svcs/PTA Provider   Choice offered to / List presented to:          [facility] arranged  HH-1 RN  HH-2 PT  HH-3 OT      [facility] agency  [facility]   Status of service:  Completed, signed off Medicare Important Message given?   (If response is "NO", the following Medicare IM given date fields will be blank) Date Medicare IM given:   Date Additional Medicare IM given:    Discharge Disposition:  HOME W HOME HEALTH SERVICES  Per UR Regulation:  Reviewed for med. necessity/level of care/duration of stay  If discussed at Long Length of Stay Meetings, dates discussed:    Comments:  05/17/12 1205 Tera Mater, RN, BSN NCM [phone] Pt. to be discharged back to [facility], independent living facility, today.  PTA pt. had [facility], HHPT/OT and resumption of these has been ordered.  Spoke with Corrie Dandy, with [facility] to make her aware of pt. discharge today.

## 2012-05-17 NOTE — Plan of Care (Signed)
Problem: Phase I Progression Outcomes Goal: EF % per last Echo/documented,Core Reminder form on chart Outcome: Completed/Met Date Met:  05/17/12 EF= 55%-60%

## 2012-05-17 NOTE — Progress Notes (Signed)
1540 report given  To Fleet Contras , from [facility] discharge instructions given to pt and daughter. Transfer paper works with daughter for the facility  1550 to lobby by whelechair by Nt

## 2012-05-17 NOTE — Progress Notes (Signed)
Occupational Therapy Evaluation Patient Details Name: [name] MRN: [id number] DOB: 1930/02/12 Today's Date: 05/17/2012 Time: 3086-5784 OT Time Calculation (min): 29 min  OT Assessment / Plan / Recommendation Clinical Impression  Pt admitted with respiratory distress and leg swelling.  Pt preparing for discharge this afternoon to return to [facility].  Pt's daughter reports that pt will have necessary level of assist from staff upon return to facility.  Will sign off at this time due to pt discharging today.     OT Assessment  All further OT needs can be met in the next venue of care    Follow Up Recommendations  Home health OT;Supervision/Assistance - 24 hour    Barriers to Discharge      Equipment Recommendations  None recommended by OT    Recommendations for Other Services    Frequency       Precautions / Restrictions Precautions Precautions: Fall Restrictions Weight Bearing Restrictions: No   Pertinent Vitals/Pain See vitals    ADL  Upper Body Dressing: Performed;Maximal assistance Where Assessed - Upper Body Dressing: Unsupported sitting Lower Body Dressing: Performed;+1 Total assistance Where Assessed - Lower Body Dressing: Supported sit to stand Toilet Transfer: Performed;Minimal assistance Toilet Transfer Method: Sit to Barista: Raised toilet seat with arms (or 3-in-1 over toilet) Toileting - Clothing Manipulation and Hygiene: Performed;Maximal assistance Where Assessed - Engineer, mining and Hygiene: Sit to stand from 3-in-1 or toilet Equipment Used: Gait belt;Rolling walker Transfers/Ambulation Related to ADLs: Min assist for sequencing and maneuvering RW.  Frequent verbal and tactile cueing to maintain upright posture. ADL Comments: Pt preparing to discharge this afternoon and requesting to use bathroom.  Pt performed UB/LB dressing while sitting on commode.    OT Diagnosis:    OT Problem List:   OT  Treatment Interventions:     OT Goals    Visit Information  Last OT Received On: 05/17/12 Assistance Needed: +1    Subjective Data      Prior Functioning  Vision/Perception  Home Living Lives With: Alone Available Help at Discharge: Available PRN/intermittently Type of Home: Assisted living Home Access: Level entry Home Layout: One level Bathroom Shower/Tub: Health visitor: Standard Home Adaptive Equipment: Walker - rolling;Bedside commode/3-in-1;Tub transfer bench Additional Comments: Pt's daughter reports that pt will have necessary level of assist with mobility and ADLs. Call buttons throughout pt's apt. Prior Function Level of Independence: Needs assistance Needs Assistance: Bathing;Dressing;Toileting;Gait Bath: Maximal Dressing: Maximal Toileting: Moderate Gait Assistance: Ambulated with RW with staff assist  Able to Take Stairs?: No Driving: No Vocation: Retired Musician: No difficulties Dominant Hand: Right      Cognition  Overall Cognitive Status: Impaired Area of Impairment: Attention;Memory;Following commands;Safety/judgement;Awareness of errors;Awareness of deficits;Problem solving Arousal/Alertness: Awake/alert Orientation Level: Appears intact for tasks assessed;Disoriented to;Time Behavior During Session: [facility] for tasks performed Current Attention Level: Focused Memory Deficits: Needed incr time to answer questions Following Commands: Follows one step commands inconsistently;Follows one step commands with increased time Safety/Judgement: Decreased safety judgement for tasks assessed Awareness of Errors: Assistance required to identify errors made;Assistance required to correct errors made Cognition - Other Comments: Pt's daughter present and reports cognitive deficits present at baseline.    Extremity/Trunk Assessment Right Upper Extremity Assessment RUE ROM/Strength/Tone: [facility] for tasks assessed Left Upper  Extremity Assessment LUE ROM/Strength/Tone: WFL for tasks assessed Trunk Assessment Trunk Assessment: Kyphotic   Mobility Bed Mobility Bed Mobility: Not assessed Transfers Sit to Stand: 4: Min assist;From chair/3-in-1;With upper extremity assist;With  armrests Stand to Sit: 4: Min assist;To chair/3-in-1;With armrests;With upper extremity assist Details for Transfer Assistance: Cues for safe hand placement & technique.  Assist to achieve standing, balance, RW management during pivot recliner>3-in-1, & controlled descent.  Used RW.     Exercise    Balance Static Standing Balance Static Standing - Balance Support: Bilateral upper extremity supported;During functional activity Static Standing - Level of Assistance: 4: Min assist Static Standing - Comment/# of Minutes: 2 minutes with bil UE supported on RW  End of Session OT - End of Session Equipment Utilized During Treatment: Gait belt Activity Tolerance: Patient limited by fatigue Patient left: in chair;with call bell/phone within reach;with family/visitor present Nurse Communication: Mobility status  GO   05/17/2012 Cipriano Mile OTR/L Pager [phone] Office [phone]   Cipriano Mile 05/17/2012, 3:55 PM

## 2012-05-17 NOTE — Discharge Summary (Addendum)
Physician Discharge Summary  VY BADLEY GNF:[id number] DOB: 04-29-1930 DOA: 05/12/2012  PCP: Cain Saupe, MD  Admit date: 05/12/2012 Discharge date: 05/17/2012  Recommendations for Outpatient Follow-up:  1. Please weight patient daily.  Also patient will need her creatinine checked.  Please refer to heart failure order sheet filled out by HF rounding team here at [facility].  Please also follow her Anemia.  Last check showed 9.1.  Ferrous sulfate was increased to BID.  Discharge Diagnoses:  Principal Problem:  *Chest pain Active Problems:  Generalized weakness  Chronic UTI  Hypothyroidism  Hyperlipidemia  Depression  Dementia  Chronic steroid use  Chronic anemia  Cardiomegaly - hypertensive  Diastolic CHF  S/P rotator cuff surgery  Temporal arteritis  Hypertension  Peripheral neuropathy  Acute exacerbation of CHF (congestive heart failure)  Volume overload  Bilateral leg edema  Hypoxia  Dependence on supplemental oxygen  Left ventricular hypertrophy due to hypertensive disease  Acute respiratory failure  Acute on chronic diastolic heart failure  Acute renal failure   Discharge Condition: Stable  Diet recommendation: Cardiac  Filed Weights   05/15/12 0547 05/16/12 0651 05/17/12 8469  Weight: 93.5 kg (206 lb 2.1 oz) 88.4 kg (194 lb 14.2 oz) 86.592 kg (190 lb 14.4 oz)    History of present illness:  From original HPI: [name] is an 78 y.o. female resident of nursing home with dementia, chronic diastolic CHF was sent to ER by primary care provider with complaints of persistent shortness of breath requiring additional supplemental oxygen. Pt had an ECHO in 2011 significant for diastolic CHF and LVH. The patient normally uses 2 L per minute of oxygen and had required additional oxygen support. Apparently had been diagnosed with pneumonia as an outpatient and started on treatment with no significant improvement. The patient is complaining of chest pain and shortness  of breath. The patient has had increasing edema in the lower extremities. The patient also has become pale and weak. She was seen in the emergency department and evaluated and found to be volume overloaded and her chest x-ray was suspicious for pneumonia and fluid overload. Her cardiac BNP was near 2000. Hospitalization was requested for further evaluation and management.   [name] is a 78 y.o. female who resides at [facility]. Medical history includes diastolic heart failure, dementia, TIAs, temporal arteritis and chronic UTIs (had been on prophylactic antibiotics for 1 year per records).  Repeat echo 05/13/12, showed LVEF 55-60%, grade 2 diastolic dysfunction. Mod TR  CT chest 05/15/12: No evidence of PNA. + edema/effusions. Procalcitonin is normal.  Hospital Course:   Acute exacerbation of chronic diastolic heart failure  -consulted Heart Failure team for further recommendations, will defer further management to them and follow up on their recommendations.  In their last note their Plan is as follows: Doing well. Volume status much better after 15 pound weight loss. Would resume home lasix at 80 daily. Ok for d/c today if creatinine stable.  Will need HHRN and PT. Will arrange for f/u in HF clinic in 1 week. We have filled out skilled facility HF orderset please attach to d/c summary.  BP up slightly can add norvasc 2.5 daily if needed.  D/w daughter.  While in house patient was placed on Lasix IV and patient lost 15 lbs while in house.  Healthcare associated pneumonia  -CT chest: Cardiomegaly with interstitial edema and moderate bilateral pleural effusions. Associated lower lobe atelectasis. No PNA reported.  As such her shortness of breath  was believed to be secondary to her Acute Diastolic CHF exacerbation.  Levaquin was discontinued at discharge.  Patient was afebrile with no productive cough and normal WBC count.  Volume overload  -Diuresis while in house and  HF team was following.  Pt with 15 lbs weight loss.  Dysphagia  - ordered MBS study  -SLP consult   Hypoxia  -Continue supplemental oxygen support at 2 L [place] PRN SOB  Hypertension  - Resume home medications and follow. Norvasc added due to above goal HTN despite home regimen.   Hypothyroidism  -Resumed home thyroid supplement  -TSH 0.531   Temporal Arteritis  - stress dosed with steroids on admission. Now is currently on home regimen.  Generalized weakness  - Will plan on discharging back to ALF with home health physical therapy   Dementia  -pt with increasing confusion, likely some delerium is a component of the increasing confusion  - CT head neg for acute findings   Anemia-chronic  Patient is on ferrous sulfate.  Will plan on increasing dose on discharge to po BID.  ARF: In the context of recent IV Lasix administration.  Once patient is placed back on home regimen suspect that her creatinine will continue to improve.  Patient has f/u appointment with cardiologist and as mentioned above should have her creatinine rechecked.  Has had great urine output while in house.  Procedures:  Swallowing evaluation  Consultations:  Speech therapist  Advanced Heart Failure Team: Dr. Arvilla Meres  Discharge Exam: Filed Vitals:   05/17/12 0839  BP: 132/46  Pulse: 70  Temp: 98.4 F (36.9 C)  Resp: 20   Filed Vitals:   05/16/12 2211 05/17/12 0633 05/17/12 0839 05/17/12 1042  BP: 150/50 149/49 132/46   Pulse: 64 66 70   Temp: 98.5 F (36.9 C) 98.4 F (36.9 C) 98.4 F (36.9 C)   TempSrc: Oral Oral Oral   Resp: 20 22 20    Height:      Weight:  86.592 kg (190 lb 14.4 oz)    SpO2: 95% 96% 97% 94%    General: Pt in NAD, Alert and Awake Cardiovascular: RRR, No rubs or gallops Respiratory: CTA BL, no wheezes  Discharge Instructions  Discharge Orders    Future Appointments: Provider: Department: Dept Phone: Center:   05/24/2012 9:30 AM [facility] [phone] None     Future Orders Please Complete By Expires   Diet - low sodium heart healthy      Increase activity slowly      Discharge instructions      Comments:   Patient will need physical therapy while at SNF.  Also please see SNF heart failure order sheet provided by the rounding heart failure team here at [facility].   Call MD for:  redness, tenderness, or signs of infection (pain, swelling, redness, odor or green/yellow discharge around incision site)      (HEART FAILURE PATIENTS) Call MD:  Anytime you have any of the following symptoms: 1) 3 pound weight gain in 24 hours or 5 pounds in 1 week 2) shortness of breath, with or without a dry hacking cough 3) swelling in the hands, feet or stomach 4) if you have to sleep on extra pillows at night in order to breathe.        Medication List  As of 05/17/2012 11:52 AM   STOP taking these medications         amoxicillin-clavulanate 875-125 MG per tablet  docusate sodium 100 MG capsule      polyethylene glycol packet      predniSONE 5 MG tablet      Vitamin D 2000 UNITS tablet         TAKE these medications         acetaminophen 325 MG tablet   Commonly known as: TYLENOL   Take 650 mg by mouth every 6 (six) hours as needed. For pain      amLODipine 2.5 MG tablet   Commonly known as: NORVASC   Take 1 tablet (2.5 mg total) by mouth daily.      cholecalciferol 1000 UNITS tablet   Commonly known as: VITAMIN D   Take 2,000 Units by mouth daily.      Cranberry 200 MG Caps   Take 200 mg by mouth 2 (two) times daily.      dipyridamole-aspirin 200-25 MG per 12 hr capsule   Commonly known as: AGGRENOX   Take 1 capsule by mouth 2 (two) times daily.      donepezil 10 MG tablet   Commonly known as: ARICEPT   Take 10 mg by mouth at bedtime.      ferrous sulfate 325 (65 FE) MG tablet   Take 325 mg by mouth twice daily.      furosemide 80 MG tablet   Commonly known as: LASIX   Take 80 mg by mouth daily.       levothyroxine 150 MCG tablet   Commonly known as: SYNTHROID, LEVOTHROID   Take 150 mcg by mouth daily.      metoprolol tartrate 25 MG tablet   Commonly known as: LOPRESSOR   Take 25 mg by mouth 2 (two) times daily.      multivitamin with minerals Tabs   Take 1 tablet by mouth daily.      omeprazole 20 MG capsule   Commonly known as: PRILOSEC   Take 20 mg by mouth daily.      predniSONE 10 MG tablet   Commonly known as: DELTASONE   Take 10 mg by mouth every other day. Prednisone 10mg  alternating with 5mg       saccharomyces boulardii 250 MG capsule   Commonly known as: FLORASTOR   Take 1 capsule (250 mg total) by mouth 2 (two) times daily.      sertraline 50 MG tablet   Commonly known as: ZOLOFT   Take 50 mg by mouth daily.      simvastatin 20 MG tablet   Commonly known as: ZOCOR   Take 20 mg by mouth at bedtime.           Follow-up Information    Follow up with Arvilla Meres, MD on 05/24/2012. (at 9:30  (gate code [phone]))    Contact information:   [address] [place] [address] [phone]           The results of significant diagnostics from this hospitalization (including imaging, microbiology, ancillary and laboratory) are listed below for reference.    Significant Diagnostic Studies: Ct Head Wo Contrast  05/14/2012  *RADIOLOGY REPORT*  Clinical Data: Decreased level of consciousness, unable to speak.  CT HEAD WITHOUT CONTRAST  Technique:  Contiguous axial images were obtained from the base of the skull through the vertex without contrast.  Comparison: 03/08/2012  Findings: The patient had difficulty remaining motionless for the study.  Images are suboptimal.  Small or subtle lesions could be overlooked.  There is no evidence for acute  infarction, intracranial hemorrhage, mass lesion, hydrocephalus, or extra-axial fluid. Moderate to severe atrophy is present.  Advanced chronic microvascular ischemic change is present in the  periventricular and subcortical white matter.  Calvarium intact.  Clear sinuses and mastoids.  Vascular calcification.  Unchanged from priors.  IMPRESSION: Atrophy with chronic microvascular ischemic change.  No visible acute stroke or bleed.   Original Report Authenticated By: Elsie Stain, M.D. ( 05/14/2012 08:34:44 )    Ct Chest Wo Contrast  05/14/2012  *RADIOLOGY REPORT*  Clinical Data: Shortness of breath, fever, increasing edema, CHF versus infiltrate  CT CHEST WITHOUT CONTRAST  Technique:  Multidetector CT imaging of the chest was performed following the standard protocol without IV contrast.  Comparison: Chest radiograph dated 05/14/2012  Findings: Bilateral perihilar/upper lobe interstitial opacities with associated interlobular septal thickening, compatible with interstitial edema.  No superimposed consolidation to suggest infection.  Moderate right and small to moderate left pleural effusions. Associated lower lobe compressive atelectasis.  No pneumothorax.  Cardiomegaly.  No pericardial effusion.  Coronary atherosclerosis. Atherosclerotic calcifications of the aortic arch.  Small mediastinal lymph nodes, including a 9 mm prevascular node (series 2/image 19) and a 12 mm precarinal node (series 2/image 22).  Visualized upper abdomen is unremarkable.  Mild degenerative changes of the visualized thoracolumbar spine.  IMPRESSION: Cardiomegaly with interstitial edema and moderate bilateral pleural effusions.  Associated lower lobe atelectasis.  No superimposed consolidation to suggest infection.   Original Report Authenticated By: Charline Bills, M.D.    Dg Chest Port 1 View  05/14/2012  *RADIOLOGY REPORT*  Clinical Data: Congestive heart failure exacerbation.  Cough. Vomiting.  Shortness of breath.  PORTABLE CHEST - 1 VIEW  Comparison: 05/13/2012  Findings: Artifact overlies chest.  Cardiomegaly persists. Interstitial and alveolar edema has worsened.  There is a small amount of pleural fluid.   IMPRESSION: Worsening pulmonary edema pattern.  Coexistent pneumonia is not excluded, but the findings may be explained completely by worsening congestive heart failure.   Original Report Authenticated By: Thomasenia Sales, M.D. ( 05/14/2012 07:52:34 )    Dg Chest Port 1 View  05/13/2012  *RADIOLOGY REPORT*  Clinical Data: CHF  PORTABLE CHEST - 1 VIEW  Comparison: Yesterday  Findings: Increasing edema which is moderate and predominately interstitial.  Patchy airspace disease at the lung bases has increased likely related to edema.  Mild cardiomegaly.  No pneumothorax.  IMPRESSION: Increasing edema and CHF.  Original Report Authenticated By: Donavan Burnet, M.D.   Dg Chest Port 1 View  05/12/2012  *RADIOLOGY REPORT*  Clinical Data: Weakness, shortness of breath.  Chest pain.  PORTABLE CHEST - 1 VIEW  Comparison: 03/08/2012  Findings: Heart is enlarged.  There is pulmonary vascular congestion.  Prominent interstitial markings are consistent with mild interstitial edema.  Left pleural effusion is present.  No focal consolidations are identified.  IMPRESSION:  1.  Cardiomegaly and changes of interstitial edema. 2.  Left pleural effusion.  Original Report Authenticated By: Patterson Hammersmith, M.D.   Dg Swallowing Func-no Report  05/16/2012  CLINICAL DATA: objectively evaluate swallow function   FLUOROSCOPY FOR SWALLOWING FUNCTION STUDY:  Fluoroscopy was provided for swallowing function study, which was  administered by a speech pathologist.  Final results and recommendations  from this study are contained within the speech pathology report.      Microbiology: Recent Results (from the past 240 hour(s))  CULTURE, BLOOD (ROUTINE X 2)     Status: Normal (Preliminary result)   Collection Time   05/12/12  12:43 PM      Component Value Range Status Comment   Specimen Description BLOOD RIGHT ARM   Final    Special Requests BOTTLES DRAWN AEROBIC ONLY 6CC   Final    Culture  Setup Time 05/12/2012 16:55   Final     Culture     Final    Value:        BLOOD CULTURE RECEIVED NO GROWTH TO DATE CULTURE WILL BE HELD FOR 5 DAYS BEFORE ISSUING A FINAL NEGATIVE REPORT   Report Status PENDING   Incomplete   CULTURE, BLOOD (ROUTINE X 2)     Status: Normal (Preliminary result)   Collection Time   05/12/12 12:50 PM      Component Value Range Status Comment   Specimen Description BLOOD RIGHT ARM   Final    Special Requests BOTTLES DRAWN AEROBIC AND ANAEROBIC 10CC   Final    Culture  Setup Time 05/12/2012 16:55   Final    Culture     Final    Value:        BLOOD CULTURE RECEIVED NO GROWTH TO DATE CULTURE WILL BE HELD FOR 5 DAYS BEFORE ISSUING A FINAL NEGATIVE REPORT   Report Status PENDING   Incomplete   URINE CULTURE     Status: Normal   Collection Time   05/12/12  1:30 PM      Component Value Range Status Comment   Specimen Description URINE, CATHETERIZED   Final    Special Requests NONE   Final    Culture  Setup Time 05/12/2012 16:55   Final    Colony Count NO GROWTH   Final    Culture NO GROWTH   Final    Report Status 05/13/2012 FINAL   Final   CLOSTRIDIUM DIFFICILE BY PCR     Status: Normal   Collection Time   05/13/12  9:58 PM      Component Value Range Status Comment   C difficile by pcr NEGATIVE  NEGATIVE Final      Labs: Basic Metabolic Panel:  Lab 05/17/12 7829 05/16/12 0610 05/15/12 0625 05/14/12 1652 05/14/12 0822 05/12/12 1809  NA 139 137 137 137 136 --  K 3.6 3.9 4.3 4.8 4.4 --  CL 93* 97 97 99 98 --  CO2 34* 32 30 29 28  --  GLUCOSE 131* 88 81 160* 95 --  BUN 32* 34* 32* 31* 29* --  CREATININE 1.74* 1.69* 1.74* 1.90* 1.86* --  CALCIUM 10.1 9.4 9.8 9.4 9.1 --  MG -- -- -- -- -- 1.7  PHOS -- -- -- -- -- --   Liver Function Tests:  Lab 05/13/12 0029 05/12/12 1232  AST 16 20  ALT 12 13  ALKPHOS 57 72  BILITOT 0.2* 0.3  PROT 6.4 7.4  ALBUMIN 2.4* 3.0*   No results found for this basename: LIPASE:5,AMYLASE:5 in the last 168 hours No results found for this basename: AMMONIA:5 in  the last 168 hours CBC:  Lab 05/16/12 0610 05/15/12 0625 05/14/12 0822 05/13/12 0029 05/12/12 1232  WBC 8.7 7.5 10.5 7.9 8.2  NEUTROABS -- -- -- -- 7.2  HGB 9.1* 10.0* 8.7* 8.4* 10.1*  HCT 27.2* 32.3* 27.6* 26.5* 31.5*  MCV 95.4 95.6 95.8 94.0 95.7  PLT 238 247 261 238 291   Cardiac Enzymes:  Lab 05/13/12 0845 05/13/12 0037 05/12/12 1809  CKTOTAL 25 27 26   CKMB 1.5 1.6 1.8  CKMBINDEX -- -- --  TROPONINI <0.30 <0.30 <0.30   BNP:  BNP (last 3 results)  Basename 05/16/12 0610 05/15/12 0625 05/14/12 0826  PROBNP 4914.0* 6703.0* 4323.0*   CBG:  Lab 05/17/12 1111 05/17/12 0552 05/16/12 2226 05/14/12 0828 05/14/12 0710  GLUCAP 127* 90 87 99 105*    Time coordinating discharge: > 30  minutes  Signed:  Penny Pia  [facility] 05/17/2012, 11:52 AM

## 2012-05-17 NOTE — Progress Notes (Signed)
Advanced Heart Failure Rounding Note   Subjective:    [name] is a 78 y.o. female who resides at [facility]. Medical history includes diastolic heart failure, dementia, TIAs, temporal arteritis and chronic UTIs (had been on prophylactic antibiotics for 1 year per records).   Repeat echo 05/13/12, showed LVEF 55-60%, grade 2 diastolic dysfunction. Mod TR CT chest 05/15/12: No evidence of PNA. + edema/effusions. Procalcitonin is normal.  Feels much better.  Slept well.  Weight down 15 pounds since admit.  She was up to chair with PT yesterday.  Labs pending.        Objective:   Weight Range:  Vital Signs:   Temp:  [98.4 F (36.9 C)-98.5 F (36.9 C)] 98.4 F (36.9 C) (08/22 0839) Pulse Rate:  [64-70] 70  (08/22 0839) Resp:  [20-22] 20  (08/22 0839) BP: (132-150)/(46-50) 132/46 mmHg (08/22 0839) SpO2:  [94 %-97 %] 94 % (08/22 1042) Weight:  [86.592 kg (190 lb 14.4 oz)] 86.592 kg (190 lb 14.4 oz) (08/22 0633) Last BM Date: 05/16/12  Weight change: Filed Weights   05/15/12 0547 05/16/12 0651 05/17/12 4696  Weight: 93.5 kg (206 lb 2.1 oz) 88.4 kg (194 lb 14.2 oz) 86.592 kg (190 lb 14.4 oz)    Intake/Output:   Intake/Output Summary (Last 24 hours) at 05/17/12 1105 Last data filed at 05/17/12 1000  Gross per 24 hour  Intake    542 ml  Output   3376 ml  Net  -2834 ml     Physical Exam:  General: Elderly. No resp difficulty. Awake/alert.   HEENT: normal  Neck: supple. JVP very hard to see. Carotids 2+ bilat; no bruits. No lymphadenopathy or thryomegaly appreciated.  Cor: PMI nondisplaced. Regular rate & rhythm. No rubs, gallops. 2/6 TR murmur.  Lungs: clear Abdomen: obese, soft, nontender, nondistended. No hepatosplenomegaly. No bruits or masses. Good bowel sounds.  Extremities: no cyanosis, clubbing, rash, trace edema.  Neuro: alert & orientedx1, cranial nerves grossly intact. moves all 4 extremities w/o difficulty. Affect  pleasant   Telemetry: Sinus rhythm 60s  Labs: Basic Metabolic Panel:  Lab 05/16/12 2952 05/15/12 0625 05/14/12 1652 05/14/12 0822 05/13/12 0029 05/12/12 1809  NA 137 137 137 136 135 --  K 3.9 4.3 4.8 4.4 4.0 --  CL 97 97 99 98 97 --  CO2 32 30 29 28 27  --  GLUCOSE 88 81 160* 95 128* --  BUN 34* 32* 31* 29* 13 --  CREATININE 1.69* 1.74* 1.90* 1.86* 1.07 --  CALCIUM 9.4 9.8 9.4 -- -- --  MG -- -- -- -- -- 1.7  PHOS -- -- -- -- -- --    Liver Function Tests:  Lab 05/13/12 0029 05/12/12 1232  AST 16 20  ALT 12 13  ALKPHOS 57 72  BILITOT 0.2* 0.3  PROT 6.4 7.4  ALBUMIN 2.4* 3.0*   No results found for this basename: LIPASE:5,AMYLASE:5 in the last 168 hours No results found for this basename: AMMONIA:3 in the last 168 hours  CBC:  Lab 05/16/12 0610 05/15/12 0625 05/14/12 0822 05/13/12 0029 05/12/12 1232  WBC 8.7 7.5 10.5 7.9 8.2  NEUTROABS -- -- -- -- 7.2  HGB 9.1* 10.0* 8.7* 8.4* 10.1*  HCT 27.2* 32.3* 27.6* 26.5* 31.5*  MCV 95.4 95.6 95.8 94.0 95.7  PLT 238 247 261 238 291    Cardiac Enzymes:  Lab 05/13/12 0845 05/13/12 0037 05/12/12 1809  CKTOTAL 25 27 26   CKMB 1.5 1.6 1.8  CKMBINDEX -- -- --  TROPONINI <0.30 <0.30 <0.30    BNP: BNP (last 3 results)  Basename 05/16/12 0610 05/15/12 0625 05/14/12 0826  PROBNP 4914.0* 6703.0* 4323.0*    Imaging: Dg Swallowing Func-no Report  05/16/2012  CLINICAL DATA: objectively evaluate swallow function   FLUOROSCOPY FOR SWALLOWING FUNCTION STUDY:  Fluoroscopy was provided for swallowing function study, which was  administered by a speech pathologist.  Final results and recommendations  from this study are contained within the speech pathology report.       Medications:     Scheduled Medications:    . amLODipine  2.5 mg Oral Daily  . cholecalciferol  2,000 Units Oral Daily  . dipyridamole-aspirin  1 capsule Oral BID  . docusate sodium  200 mg Oral Daily  . donepezil  10 mg Oral QHS  . feeding supplement  237  mL Oral BID BM  . ferrous sulfate  325 mg Oral Q breakfast  . furosemide  40 mg Intravenous Q8H  . heparin  5,000 Units Subcutaneous Q8H  . levothyroxine  150 mcg Oral Q breakfast  . metoprolol tartrate  25 mg Oral BID  . multivitamin with minerals  1 tablet Oral Daily  . pantoprazole  40 mg Oral Q1200  . predniSONE  10 mg Oral Q breakfast  . saccharomyces boulardii  250 mg Oral BID  . sertraline  50 mg Oral Daily  . simvastatin  20 mg Oral QHS  . sodium chloride  3 mL Intravenous Q12H  . DISCONTD: amLODipine  5 mg Oral Daily  . DISCONTD: levofloxacin (LEVAQUIN) IV  750 mg Intravenous Q48H    Infusions:    PRN Medications: sodium chloride, acetaminophen, ondansetron (ZOFRAN) IV, polyethylene glycol, sodium chloride   Assessment:   1. Acute respiratory failure   2. Acute on Chronic diastolic HF  4. Acute delirium  5. Dementia  6. Acute renal failure - Cr. 1.0-> 1.86  7. Anemia, acute on chronic  8. DNR/DNI  Plan/Discussion:    Doing well. Volume status much better after 15 pound weight loss. Would resume home lasix at 80 daily. Ok for d/c today if creatinine stable.   Will need HHRN and PT. Will arrange for f/u in HF clinic in 1 week. We have filled out skilled facility HF orderset please attach to d/c summary.  BP up slightly can add norvasc 2.5 daily if needed.   D/w daughter.  Length of Stay: 5  Renarda Mullinix,MD 11:08 AM

## 2012-05-17 NOTE — Progress Notes (Signed)
Physical Therapy Treatment Patient Details Name: [name] MRN: [id number] DOB: 12/23/1929 Today's Date: 05/17/2012 Time: 2725-3664 PT Time Calculation (min): 22 min  PT Assessment / Plan / Recommendation Comments on Treatment Session  Pt progressing with PT goals.  ambulated ~40' today with RW.      Follow Up Recommendations  Skilled nursing facility;Supervision/Assistance - 24 hour    Barriers to Discharge        Equipment Recommendations  None recommended by PT    Recommendations for Other Services    Frequency Min 3X/week   Plan Discharge plan remains appropriate    Precautions / Restrictions Precautions Precautions: Fall Restrictions Weight Bearing Restrictions: No       Mobility  Bed Mobility Bed Mobility: Not assessed Transfers Transfers: Sit to Stand;Stand to Sit;Stand Pivot Transfers Sit to Stand: 4: Min assist;With upper extremity assist;With armrests;From chair/3-in-1 Stand to Sit: 4: Min assist;With upper extremity assist;With armrests;To chair/3-in-1 Stand Pivot Transfers: 4: Min assist Details for Transfer Assistance: Cues for safe hand placement & technique.  Assist to achieve standing, balance, RW management during pivot recliner>3-in-1, & controlled descent.  Used RW.   Ambulation/Gait Ambulation/Gait Assistance: 4: Min assist (+2 to follow with recliner to encourage increased distance) Ambulation Distance (Feet): 50 Feet Assistive device: Rolling walker Ambulation/Gait Assistance Details: Cues for tall posture, safe RW management, & encouragement to increase distance.  Fatigues quickly.  +2 followed with recliner.   Gait Pattern: Step-through pattern;Decreased stride length;Trunk flexed Stairs: No Wheelchair Mobility Wheelchair Mobility: No      PT Goals Acute Rehab PT Goals Time For Goal Achievement: 05/30/12 PT Goal: Sit to Stand - Progress: Progressing toward goal PT Transfer Goal: Bed to Chair/Chair to Bed - Progress: Progressing  toward goal PT Goal: Ambulate - Progress: Progressing toward goal  Visit Information  Last PT Received On: 05/17/12 Assistance Needed: +2    Subjective Data      Cognition  Overall Cognitive Status: No family/caregiver present to determine baseline cognitive functioning Cognition - Other Comments: slow to process cues    Balance     End of Session PT - End of Session Equipment Utilized During Treatment: Gait belt Activity Tolerance: Patient tolerated treatment well;Patient limited by fatigue Patient left: in chair;with call bell/phone within reach;with chair alarm set Nurse Communication: Mobility status    [name], [place] [phone] 05/17/2012

## 2012-05-18 LAB — CULTURE, BLOOD (ROUTINE X 2): Culture: NO GROWTH

## 2012-05-24 ENCOUNTER — Ambulatory Visit (HOSPITAL_COMMUNITY)
Admit: 2012-05-24 | Discharge: 2012-05-24 | Disposition: A | Discharge status: Home / Self Care | Payer: Medicare Other | Attending: Internal Medicine | Admitting: Internal Medicine

## 2012-05-24 ENCOUNTER — Encounter (HOSPITAL_COMMUNITY): Payer: Self-pay

## 2012-05-24 VITALS — BP 150/58 | HR 64 | Ht 70.0 in | Wt 204.8 lb

## 2012-05-24 DIAGNOSIS — L89159 Pressure ulcer of sacral region, unspecified stage: Secondary | ICD-10-CM | POA: Insufficient documentation

## 2012-05-24 DIAGNOSIS — I5032 Chronic diastolic (congestive) heart failure: Secondary | ICD-10-CM | POA: Insufficient documentation

## 2012-05-24 DIAGNOSIS — I10 Essential (primary) hypertension: Secondary | ICD-10-CM | POA: Insufficient documentation

## 2012-05-24 DIAGNOSIS — L8990 Pressure ulcer of unspecified site, unspecified stage: Secondary | ICD-10-CM | POA: Insufficient documentation

## 2012-05-24 DIAGNOSIS — L89109 Pressure ulcer of unspecified part of back, unspecified stage: Secondary | ICD-10-CM | POA: Insufficient documentation

## 2012-05-24 LAB — BASIC METABOLIC PANEL WITH GFR
BUN: 21 mg/dL (ref 6–23)
CO2: 33 meq/L — ABNORMAL HIGH (ref 19–32)
Calcium: 9.6 mg/dL (ref 8.4–10.5)
Chloride: 94 meq/L — ABNORMAL LOW (ref 96–112)
Creatinine, Ser: 1.51 mg/dL — ABNORMAL HIGH (ref 0.50–1.10)
GFR calc Af Amer: 36 mL/min — ABNORMAL LOW
GFR calc non Af Amer: 31 mL/min — ABNORMAL LOW
Glucose, Bld: 102 mg/dL — ABNORMAL HIGH (ref 70–99)
Potassium: 3.2 meq/L — ABNORMAL LOW (ref 3.5–5.1)
Sodium: 137 meq/L (ref 135–145)

## 2012-05-24 NOTE — Progress Notes (Signed)
PCP: Dr. Cain Saupe   Weight Range   190-192 pounds  Baseline proBNP   1758 on 05/12/12   DNR/DNI  HPI: [name] is a 78 y.o. female who resides at [facility]. Medical history includes diastolic heart failure, dementia, TIAs, temporal arteritis and chronic UTIs (had been on prophylactic antibiotics for 1 year per records).   Echo 05/13/12, showed LVEF 55-60%, grade 2 diastolic dysfunction. Mod TR  Admitted [facility] 8/17-8/22 for exacerbation of diastolic heart failure and HCAP.  Treated with abx and IV diuresis.  Diuresed 15 pounds with improvement in her symptoms.  Discharged back to [facility] with [facility] and PT.    She returns for post hospital follow up today with her daughter.  She feels poorly due to sacral wound.  She says her breathing is ok.  She is not being weighed at the facility due to they are awaiting wheelchair scale, although patient can stand to weight with assistance.  She says her breathing is ok today, she is not on her O2 today.  Says they only use it periodically.  History is difficult to obtain as patient has dementia and is only concerned about sacral pain and daughter is unsure what the facility is doing day to day.  Heather, RN has called the facility and it appears she is receiving her meds prescribed at discharge.  And again no weights.  Patient has not started PT as she recalls.   ROS: All systems negative except as listed in HPI, PMH and Problem List.  Past Medical History  Diagnosis Date  . Peripheral neuropathy   . Hypertension   . TIA (transient ischemic attack)   . Elevated sed rate   . Temporal arteritis     chronic steroids  . Hypothyroidism   . Colon cancer     colon  . History of hernia repair   . S/P rotator cuff surgery     right  . Diastolic CHF   . Cardiomegaly - hypertensive   . Chronic anemia   . Chronic steroid use   . Dementia   . Shortness of breath   . Hyperlipidemia   . CHF (congestive  heart failure)   . Cardiomyopathy due to hypertension   . Dependence on supplemental oxygen   . Chronic UTI     Current Outpatient Prescriptions  Medication Sig Dispense Refill  . acetaminophen (TYLENOL) 325 MG tablet Take 650 mg by mouth every 6 (six) hours as needed. For pain      . amLODipine (NORVASC) 2.5 MG tablet Take 1 tablet (2.5 mg total) by mouth daily.  30 tablet  0  . cholecalciferol (VITAMIN D) 1000 UNITS tablet Take 2,000 Units by mouth daily.      . Cranberry 200 MG CAPS Take 200 mg by mouth 2 (two) times daily.      Marland Kitchen dipyridamole-aspirin (AGGRENOX) 25-200 MG per 12 hr capsule Take 1 capsule by mouth 2 (two) times daily.        . ferrous sulfate 325 (65 FE) MG tablet Take 325 mg by mouth daily.       . furosemide (LASIX) 80 MG tablet Take 80 mg by mouth daily.      Marland Kitchen levothyroxine (SYNTHROID, LEVOTHROID) 150 MCG tablet Take 150 mcg by mouth daily.        . metoprolol tartrate (LOPRESSOR) 25 MG tablet Take 25 mg by mouth 2 (two) times daily.      . Multiple Vitamin (MULITIVITAMIN  WITH MINERALS) TABS Take 1 tablet by mouth daily.        Marland Kitchen omeprazole (PRILOSEC) 20 MG capsule Take 20 mg by mouth daily.        . predniSONE (DELTASONE) 10 MG tablet Take 10 mg by mouth every other day. Prednisone 10mg  alternating with 5mg       . saccharomyces boulardii (FLORASTOR) 250 MG capsule Take 1 capsule (250 mg total) by mouth 2 (two) times daily.  14 capsule  0  . sertraline (ZOLOFT) 50 MG tablet Take 50 mg by mouth daily.        . simvastatin (ZOCOR) 20 MG tablet Take 20 mg by mouth at bedtime.        . donepezil (ARICEPT) 10 MG tablet Take 10 mg by mouth at bedtime.          PHYSICAL EXAM: Filed Vitals:   05/24/12 0944  BP: 150/58  Pulse: 64  Height: 5\' 10"  (1.778 m)  Weight: 204 lb 12.8 oz (92.897 kg)  SpO2: 98%    General: Elderly. No resp difficulty. Awake/alert.  HEENT: normal  Neck: supple. JVP very hard to see. Carotids 2+ bilat; no bruits. No lymphadenopathy or  thryomegaly appreciated.  Cor: PMI nondisplaced. Regular rate & rhythm. No rubs, gallops. 2/6 TR murmur.  Lungs: clear  Abdomen: obese, soft, nontender, nondistended. No hepatosplenomegaly. No bruits or masses. Good bowel sounds.  Extremities: no cyanosis, clubbing, rash, 1+ edema.  Neuro: alert & orientedx1, cranial nerves grossly intact. moves all 4 extremities w/o difficulty. Affect pleasant     ASSESSMENT & PLAN:

## 2012-05-24 NOTE — Assessment & Plan Note (Signed)
Volume status elevated.  The facility has not been weighing the patient and weight trending up.  Currently no dyspnea, orthopnea or PND, although patient is poor historian.  Will change lasix to demadex 60mg  daily.  Have sent orders to the facility to weigh patient every day and fax weights to the clinic every Monday.  Check BMET today.  Follow up 1 week.

## 2012-05-24 NOTE — Assessment & Plan Note (Signed)
Uncontrolled, would like SBP<140.  Will increase norvasc 5 mg daily.

## 2012-05-24 NOTE — Patient Instructions (Addendum)
Stop lasix  Start demadex  Labs today  DAILY WEIGHTS  Increase norvasc 5 mg daily

## 2012-05-24 NOTE — Assessment & Plan Note (Signed)
Patient is followed by [facility], will have wound care nurse evaluate and treat.

## 2012-05-30 ENCOUNTER — Ambulatory Visit (HOSPITAL_COMMUNITY)
Admission: RE | Admit: 2012-05-30 | Discharge: 2012-05-30 | Disposition: A | Discharge status: Home / Self Care | Payer: Medicare Other | Source: Ambulatory Visit | Attending: Internal Medicine | Admitting: Internal Medicine

## 2012-05-30 VITALS — BP 136/60 | HR 87 | Wt 199.0 lb

## 2012-05-30 DIAGNOSIS — I5032 Chronic diastolic (congestive) heart failure: Secondary | ICD-10-CM

## 2012-05-30 LAB — BASIC METABOLIC PANEL
BUN: 16 mg/dL (ref 6–23)
CO2: 30 mEq/L (ref 19–32)
Calcium: 9.5 mg/dL (ref 8.4–10.5)
Chloride: 95 mEq/L — ABNORMAL LOW (ref 96–112)
Creatinine, Ser: 1.72 mg/dL — ABNORMAL HIGH (ref 0.50–1.10)
Glucose, Bld: 92 mg/dL (ref 70–99)

## 2012-05-30 MED ORDER — POTASSIUM CHLORIDE ER 10 MEQ PO TBCR: 20.0000 meq | EXTENDED_RELEASE_TABLET | Freq: Two times a day (BID) | ORAL | Status: DC

## 2012-05-30 NOTE — Assessment & Plan Note (Signed)
Volume status improved. Continue current diuretic regimen. I have requested assisted living facility provide weekly weights and a list of his medications. Reviewed bmet from last week. Will repeat bmet today due to reduced potassium noted. I have provided written instructions to assisted living for daily weights. Also asked them to fax weights weekly and provide a current list of her medications.  Follow up in 1 month.

## 2012-05-30 NOTE — Patient Instructions (Addendum)
Follow up in 1 month   Please weigh daily

## 2012-05-30 NOTE — Progress Notes (Signed)
Patient ID: [name], female   DOB: 1930/01/20, 78 y.o.   MRN: [id number] HPI: [name] is a 78 y.o. female who resides at [facility]. Medical history includes diastolic heart failure, dementia, TIAs, temporal arteritis and chronic UTIs (had been on prophylactic antibiotics for 1 year per records).  Echo 05/13/12, showed LVEF 55-60%, grade 2 diastolic dysfunction. Mod TR   Admitted [facility] 8/17-8/22 for exacerbation of diastolic heart failure and HCAP. Treated with abx and IV diuresis. Diuresed 15 pounds with improvement in her symptoms. Discharged back to [facility] with [facility] and PT.   She returns for  follow up today with her daughter. Last visit lasix stopped, torsemide 60 mg daily, and norvasc 5 mg started. Feels much better. Daughter feels like she is doing much better. More alert and active at facility. According to her daughter her weight is down 5 pounds at assisted living [facility]) [name] what medications she is taking. Buttock wound treated by [facility].    ROS: All systems negative except as listed in HPI, PMH and Problem List.  Past Medical History  Diagnosis Date  . Peripheral neuropathy   . Hypertension   . TIA (transient ischemic attack)   . Elevated sed rate   . Temporal arteritis     chronic steroids  . Hypothyroidism   . Colon cancer     colon  . History of hernia repair   . S/P rotator cuff surgery     right  . Diastolic CHF   . Cardiomegaly - hypertensive   . Chronic anemia   . Chronic steroid use   . Dementia   . Shortness of breath   . Hyperlipidemia   . CHF (congestive heart failure)   . Cardiomyopathy due to hypertension   . Dependence on supplemental oxygen   . Chronic UTI     Current Outpatient Prescriptions  Medication Sig Dispense Refill  . acetaminophen (TYLENOL) 325 MG tablet Take 650 mg by mouth every 6 (six) hours as needed. For pain      . amLODipine (NORVASC)  2.5 MG tablet Take 1 tablet (2.5 mg total) by mouth daily.  30 tablet  0  . cholecalciferol (VITAMIN D) 1000 UNITS tablet Take 2,000 Units by mouth daily.      . Cranberry 200 MG CAPS Take 200 mg by mouth 2 (two) times daily.      Marland Kitchen dipyridamole-aspirin (AGGRENOX) 25-200 MG per 12 hr capsule Take 1 capsule by mouth 2 (two) times daily.        Marland Kitchen donepezil (ARICEPT) 10 MG tablet Take 10 mg by mouth at bedtime.       . ferrous sulfate 325 (65 FE) MG tablet Take 325 mg by mouth daily.       . furosemide (LASIX) 80 MG tablet Take 80 mg by mouth daily.      Marland Kitchen levothyroxine (SYNTHROID, LEVOTHROID) 150 MCG tablet Take 150 mcg by mouth daily.        . metoprolol tartrate (LOPRESSOR) 25 MG tablet Take 25 mg by mouth 2 (two) times daily.      . Multiple Vitamin (MULITIVITAMIN WITH MINERALS) TABS Take 1 tablet by mouth daily.        Marland Kitchen omeprazole (PRILOSEC) 20 MG capsule Take 20 mg by mouth daily.        . predniSONE (DELTASONE) 10 MG tablet Take 10 mg by mouth every other day. Prednisone 10mg  alternating with 5mg       .  sertraline (ZOLOFT) 50 MG tablet Take 50 mg by mouth daily.        . simvastatin (ZOCOR) 20 MG tablet Take 20 mg by mouth at bedtime.           PHYSICAL EXAM: Filed Vitals:   05/30/12 1227  BP: 136/60  Pulse: 87   Weight change:  199 (204 pounds) General:  Well appearing. No resp difficulty In wheelchair. Daughter present HEENT: normal Neck: supple. JVP 5-6  Carotids 2+ bilaterally; no bruits. No lymphadenopathy or thryomegaly appreciated. Cor: PMI normal. Regular rate & rhythm. No rubs, gallops or murmurs. Lungs: clear on 2 liters Abdomen: soft, nontender, nondistended. No hepatosplenomegaly. No bruits or masses. Good bowel sounds. Extremities: no cyanosis, clubbing, rash, edema R and LLE ted hose in place.  Neuro: alert & orientedx3, cranial nerves grossly intact. Moves all 4 extremities w/o difficulty. Affect pleasant.      ASSESSMENT & PLAN:

## 2012-06-15 ENCOUNTER — Other Ambulatory Visit: Payer: Self-pay | Admitting: Internal Medicine

## 2012-06-15 DIAGNOSIS — Z1231 Encounter for screening mammogram for malignant neoplasm of breast: Secondary | ICD-10-CM

## 2012-06-25 ENCOUNTER — Encounter (HOSPITAL_COMMUNITY): Payer: Self-pay

## 2012-06-25 ENCOUNTER — Ambulatory Visit (HOSPITAL_COMMUNITY)
Admission: RE | Admit: 2012-06-25 | Discharge: 2012-06-25 | Disposition: A | Discharge status: Home / Self Care | Payer: Medicare Other | Source: Ambulatory Visit | Attending: Internal Medicine | Admitting: Internal Medicine

## 2012-06-25 VITALS — BP 118/56 | HR 88 | Wt 197.0 lb

## 2012-06-25 DIAGNOSIS — I509 Heart failure, unspecified: Secondary | ICD-10-CM | POA: Insufficient documentation

## 2012-06-25 DIAGNOSIS — I5032 Chronic diastolic (congestive) heart failure: Secondary | ICD-10-CM | POA: Insufficient documentation

## 2012-06-25 NOTE — Progress Notes (Signed)
Patient ID: [name], female   DOB: 1929-10-22, 78 y.o.   MRN: [id number] HPI: [name] is a 78 y.o. female who resides at [facility]. Medical history includes diastolic heart failure, dementia, TIAs, temporal arteritis and chronic UTIs (had been on prophylactic antibiotics for 1 year per records).   Echo 05/13/12, showed LVEF 55-60%, grade 2 diastolic dysfunction. Mod TR   Admitted [facility] 8/17-8/22 for exacerbation of diastolic heart failure and HCAP. Treated with abx and IV diuresis. Diuresed 15 pounds with improvement in her symptoms. Discharged back to [facility] with [facility] and PT.   She returns for  follow up today with her daughter. Complains of intermittent nausea. Denies SOB/PND/Orthopnea. Complains of dizziness. Blood pressure improved 116-130s. Weight at facility down from 206 to 199 pounds. She has several days reported of refusing medications. At least one daily.      ROS: All systems negative except as listed in HPI, PMH and Problem List.  Past Medical History  Diagnosis Date  . Peripheral neuropathy   . Hypertension   . TIA (transient ischemic attack)   . Elevated sed rate   . Temporal arteritis     chronic steroids  . Hypothyroidism   . Colon cancer     colon  . History of hernia repair   . S/P rotator cuff surgery     right  . Diastolic CHF   . Cardiomegaly - hypertensive   . Chronic anemia   . Chronic steroid use   . Dementia   . Shortness of breath   . Hyperlipidemia   . CHF (congestive heart failure)   . Cardiomyopathy due to hypertension   . Dependence on supplemental oxygen   . Chronic UTI     Current Outpatient Prescriptions  Medication Sig Dispense Refill  . acetaminophen (TYLENOL) 325 MG tablet Take 650 mg by mouth every 6 (six) hours as needed. For pain      . amLODipine (NORVASC) 2.5 MG tablet Take 5 mg by mouth daily.      . cholecalciferol (VITAMIN D) 1000 UNITS tablet Take 2,000 Units by  mouth daily.      . Cranberry 200 MG CAPS Take 200 mg by mouth 2 (two) times daily.      Marland Kitchen dipyridamole-aspirin (AGGRENOX) 25-200 MG per 12 hr capsule Take 1 capsule by mouth 2 (two) times daily.        Marland Kitchen donepezil (ARICEPT) 10 MG tablet Take 10 mg by mouth at bedtime.       . ferrous sulfate 325 (65 FE) MG tablet Take 325 mg by mouth daily.       Marland Kitchen levothyroxine (SYNTHROID, LEVOTHROID) 150 MCG tablet Take 150 mcg by mouth daily.        . metoprolol tartrate (LOPRESSOR) 25 MG tablet Take 25 mg by mouth 2 (two) times daily.      . Multiple Vitamin (MULITIVITAMIN WITH MINERALS) TABS Take 1 tablet by mouth daily.        Marland Kitchen omeprazole (PRILOSEC) 20 MG capsule Take 20 mg by mouth daily.        . potassium chloride (K-DUR) 10 MEQ tablet Take 2 tablets (20 mEq total) by mouth 2 (two) times daily.  120 tablet  3  . predniSONE (DELTASONE) 10 MG tablet Take 10 mg by mouth every other day. Prednisone 10mg  alternating with 5mg       . sertraline (ZOLOFT) 50 MG tablet Take 50 mg by mouth daily.        Marland Kitchen  simvastatin (ZOCOR) 20 MG tablet Take 20 mg by mouth at bedtime.        . torsemide (DEMADEX) 20 MG tablet Take 60 mg by mouth daily.         PHYSICAL EXAM: Filed Vitals:   06/25/12 1450  BP: 118/56  Pulse: 88   Weight change:  197 (199 pounds)  General:  Well appearing. No resp difficulty In wheelchair. Daughter present HEENT: normal Neck: supple. JVP 5-6  Carotids 2+ bilaterally; no bruits. No lymphadenopathy or thryomegaly appreciated. Cor: PMI normal. Regular rate & rhythm. No rubs, gallops or murmurs. Lungs: clear on 2 liters Abdomen: soft, nontender, nondistended. No hepatosplenomegaly. No bruits or masses. Good bowel sounds. Extremities: no cyanosis, clubbing, rash, edema R and LLE ted hose in place.  Neuro: alert & orientedx3, cranial nerves grossly intact. Moves all 4 extremities w/o difficulty. Affect pleasant.      ASSESSMENT & PLAN:

## 2012-06-25 NOTE — Patient Instructions (Addendum)
Follow up in 3 months

## 2012-06-25 NOTE — Assessment & Plan Note (Signed)
Volume status stable. Continue current diuretic regimen. Difficult to manage due to dementia as she is refusing some medicaitons. Reinforced daily weights. Follow up in 3 months

## 2012-07-10 ENCOUNTER — Ambulatory Visit
Admission: RE | Admit: 2012-07-10 | Discharge: 2012-07-10 | Disposition: A | Discharge status: Home / Self Care | Payer: Medicare Other | Source: Ambulatory Visit | Attending: Internal Medicine | Admitting: Internal Medicine

## 2012-07-10 DIAGNOSIS — Z1231 Encounter for screening mammogram for malignant neoplasm of breast: Secondary | ICD-10-CM

## 2012-09-02 ENCOUNTER — Emergency Department (HOSPITAL_COMMUNITY): Payer: Medicare Other

## 2012-09-02 ENCOUNTER — Inpatient Hospital Stay (HOSPITAL_COMMUNITY)
Admission: EM | Admit: 2012-09-02 | Discharge: 2012-09-07 | DRG: 291 | Disposition: A | Discharge status: Home Health | Payer: Medicare Other | Attending: Family Medicine | Admitting: Family Medicine

## 2012-09-02 ENCOUNTER — Inpatient Hospital Stay (HOSPITAL_COMMUNITY): Payer: Medicare Other

## 2012-09-02 ENCOUNTER — Encounter (HOSPITAL_COMMUNITY): Payer: Self-pay | Admitting: Physical Medicine and Rehabilitation

## 2012-09-02 DIAGNOSIS — N183 Chronic kidney disease, stage 3 unspecified: Secondary | ICD-10-CM | POA: Diagnosis present

## 2012-09-02 DIAGNOSIS — R079 Chest pain, unspecified: Secondary | ICD-10-CM

## 2012-09-02 DIAGNOSIS — Z85038 Personal history of other malignant neoplasm of large intestine: Secondary | ICD-10-CM

## 2012-09-02 DIAGNOSIS — Z8673 Personal history of transient ischemic attack (TIA), and cerebral infarction without residual deficits: Secondary | ICD-10-CM

## 2012-09-02 DIAGNOSIS — I130 Hypertensive heart and chronic kidney disease with heart failure and stage 1 through stage 4 chronic kidney disease, or unspecified chronic kidney disease: Principal | ICD-10-CM | POA: Diagnosis present

## 2012-09-02 DIAGNOSIS — R600 Localized edema: Secondary | ICD-10-CM

## 2012-09-02 DIAGNOSIS — I5032 Chronic diastolic (congestive) heart failure: Secondary | ICD-10-CM

## 2012-09-02 DIAGNOSIS — F0390 Unspecified dementia without behavioral disturbance: Secondary | ICD-10-CM

## 2012-09-02 DIAGNOSIS — G459 Transient cerebral ischemic attack, unspecified: Secondary | ICD-10-CM

## 2012-09-02 DIAGNOSIS — E8770 Fluid overload, unspecified: Secondary | ICD-10-CM

## 2012-09-02 DIAGNOSIS — L89159 Pressure ulcer of sacral region, unspecified stage: Secondary | ICD-10-CM

## 2012-09-02 DIAGNOSIS — J9600 Acute respiratory failure, unspecified whether with hypoxia or hypercapnia: Secondary | ICD-10-CM

## 2012-09-02 DIAGNOSIS — E039 Hypothyroidism, unspecified: Secondary | ICD-10-CM

## 2012-09-02 DIAGNOSIS — E876 Hypokalemia: Secondary | ICD-10-CM

## 2012-09-02 DIAGNOSIS — C189 Malignant neoplasm of colon, unspecified: Secondary | ICD-10-CM

## 2012-09-02 DIAGNOSIS — I10 Essential (primary) hypertension: Secondary | ICD-10-CM

## 2012-09-02 DIAGNOSIS — Z9889 Other specified postprocedural states: Secondary | ICD-10-CM

## 2012-09-02 DIAGNOSIS — E785 Hyperlipidemia, unspecified: Secondary | ICD-10-CM

## 2012-09-02 DIAGNOSIS — F329 Major depressive disorder, single episode, unspecified: Secondary | ICD-10-CM | POA: Diagnosis present

## 2012-09-02 DIAGNOSIS — I509 Heart failure, unspecified: Secondary | ICD-10-CM

## 2012-09-02 DIAGNOSIS — D649 Anemia, unspecified: Secondary | ICD-10-CM

## 2012-09-02 DIAGNOSIS — G609 Hereditary and idiopathic neuropathy, unspecified: Secondary | ICD-10-CM | POA: Diagnosis present

## 2012-09-02 DIAGNOSIS — J984 Other disorders of lung: Secondary | ICD-10-CM | POA: Diagnosis present

## 2012-09-02 DIAGNOSIS — R4182 Altered mental status, unspecified: Secondary | ICD-10-CM

## 2012-09-02 DIAGNOSIS — M316 Other giant cell arteritis: Secondary | ICD-10-CM

## 2012-09-02 DIAGNOSIS — R531 Weakness: Secondary | ICD-10-CM

## 2012-09-02 DIAGNOSIS — R0603 Acute respiratory distress: Secondary | ICD-10-CM

## 2012-09-02 DIAGNOSIS — I5030 Unspecified diastolic (congestive) heart failure: Secondary | ICD-10-CM | POA: Diagnosis present

## 2012-09-02 DIAGNOSIS — Z8719 Personal history of other diseases of the digestive system: Secondary | ICD-10-CM

## 2012-09-02 DIAGNOSIS — I5033 Acute on chronic diastolic (congestive) heart failure: Secondary | ICD-10-CM

## 2012-09-02 DIAGNOSIS — F32A Depression, unspecified: Secondary | ICD-10-CM

## 2012-09-02 DIAGNOSIS — IMO0002 Reserved for concepts with insufficient information to code with codable children: Secondary | ICD-10-CM

## 2012-09-02 DIAGNOSIS — N179 Acute kidney failure, unspecified: Secondary | ICD-10-CM

## 2012-09-02 DIAGNOSIS — D72829 Elevated white blood cell count, unspecified: Secondary | ICD-10-CM

## 2012-09-02 DIAGNOSIS — I119 Hypertensive heart disease without heart failure: Secondary | ICD-10-CM

## 2012-09-02 DIAGNOSIS — Z9981 Dependence on supplemental oxygen: Secondary | ICD-10-CM

## 2012-09-02 DIAGNOSIS — J189 Pneumonia, unspecified organism: Secondary | ICD-10-CM

## 2012-09-02 DIAGNOSIS — N390 Urinary tract infection, site not specified: Secondary | ICD-10-CM

## 2012-09-02 DIAGNOSIS — A498 Other bacterial infections of unspecified site: Secondary | ICD-10-CM | POA: Diagnosis present

## 2012-09-02 DIAGNOSIS — I43 Cardiomyopathy in diseases classified elsewhere: Secondary | ICD-10-CM | POA: Diagnosis present

## 2012-09-02 DIAGNOSIS — G629 Polyneuropathy, unspecified: Secondary | ICD-10-CM

## 2012-09-02 DIAGNOSIS — Z79899 Other long term (current) drug therapy: Secondary | ICD-10-CM

## 2012-09-02 DIAGNOSIS — R0902 Hypoxemia: Secondary | ICD-10-CM

## 2012-09-02 LAB — CBC WITH DIFFERENTIAL/PLATELET
Basophils Absolute: 0 10*3/uL (ref 0.0–0.1)
Eosinophils Absolute: 0 10*3/uL (ref 0.0–0.7)
Eosinophils Relative: 0 % (ref 0–5)
HCT: 37.6 % (ref 36.0–46.0)
Lymphocytes Relative: 23 % (ref 12–46)
MCH: 29.9 pg (ref 26.0–34.0)
MCHC: 33.5 g/dL (ref 30.0–36.0)
MCV: 89.1 fL (ref 78.0–100.0)
Monocytes Absolute: 0.7 10*3/uL (ref 0.1–1.0)
Platelets: 293 10*3/uL (ref 150–400)
RDW: 15.7 % — ABNORMAL HIGH (ref 11.5–15.5)
WBC: 18.7 10*3/uL — ABNORMAL HIGH (ref 4.0–10.5)

## 2012-09-02 LAB — CREATININE, SERUM
Creatinine, Ser: 1.65 mg/dL — ABNORMAL HIGH (ref 0.50–1.10)
GFR calc non Af Amer: 28 mL/min — ABNORMAL LOW (ref >90)

## 2012-09-02 LAB — URINALYSIS, ROUTINE W REFLEX MICROSCOPIC
Ketones, ur: NEGATIVE mg/dL
Nitrite: POSITIVE — AB
Protein, ur: NEGATIVE mg/dL

## 2012-09-02 LAB — COMPREHENSIVE METABOLIC PANEL
AST: 21 U/L (ref 0–37)
CO2: 26 mEq/L (ref 19–32)
Calcium: 9.7 mg/dL (ref 8.4–10.5)
Creatinine, Ser: 1.55 mg/dL — ABNORMAL HIGH (ref 0.50–1.10)
GFR calc Af Amer: 35 mL/min — ABNORMAL LOW (ref >90)
GFR calc non Af Amer: 30 mL/min — ABNORMAL LOW (ref >90)
Total Protein: 7.9 g/dL (ref 6.0–8.3)

## 2012-09-02 LAB — CBC
MCHC: 32.8 g/dL (ref 30.0–36.0)
RDW: 15.9 % — ABNORMAL HIGH (ref 11.5–15.5)

## 2012-09-02 LAB — TROPONIN I: Troponin I: <0.3 ng/mL (ref <0.30)

## 2012-09-02 LAB — PROTIME-INR: INR: 0.91 (ref 0.00–1.49)

## 2012-09-02 MED ORDER — ONDANSETRON HCL 4 MG/2ML IJ SOLN: 4.0000 mg | Freq: Three times a day (TID) | INTRAMUSCULAR | Status: DC | PRN

## 2012-09-02 MED ORDER — PREDNISONE 20 MG PO TABS
30.0000 mg | ORAL_TABLET | Freq: Every day | ORAL | Status: DC
  Filled 2012-09-02: qty 1

## 2012-09-02 MED ORDER — SODIUM CHLORIDE 0.9 % IV SOLN: 250.0000 mL | INTRAVENOUS | Status: DC | PRN

## 2012-09-02 MED ORDER — SENNOSIDES-DOCUSATE SODIUM 8.6-50 MG PO TABS
1.0000 | ORAL_TABLET | Freq: Every evening | ORAL | Status: DC | PRN
  Filled 2012-09-02: qty 1

## 2012-09-02 MED ORDER — CALCIUM POLYCARBOPHIL 625 MG PO TABS
625.0000 mg | ORAL_TABLET | Freq: Every day | ORAL | Status: DC
  Administered 2012-09-03 – 2012-09-07 (×5): 625 mg via ORAL
  Filled 2012-09-02 (×7): qty 1

## 2012-09-02 MED ORDER — SODIUM CHLORIDE 0.9 % IJ SOLN: 3.0000 mL | INTRAMUSCULAR | Status: DC | PRN

## 2012-09-02 MED ORDER — ASPIRIN-DIPYRIDAMOLE ER 25-200 MG PO CP12
1.0000 | ORAL_CAPSULE | Freq: Two times a day (BID) | ORAL | Status: DC
  Administered 2012-09-02 – 2012-09-07 (×10): 1 via ORAL
  Filled 2012-09-02 (×12): qty 1

## 2012-09-02 MED ORDER — BISACODYL 10 MG RE SUPP: 10.0000 mg | Freq: Every day | RECTAL | Status: DC | PRN

## 2012-09-02 MED ORDER — ACETAMINOPHEN 325 MG PO TABS: 650.0000 mg | ORAL_TABLET | Freq: Four times a day (QID) | ORAL | Status: DC | PRN

## 2012-09-02 MED ORDER — SODIUM CHLORIDE 0.9 % IV SOLN
INTRAVENOUS | Status: DC
  Administered 2012-09-02: 16:00:00 via INTRAVENOUS

## 2012-09-02 MED ORDER — ONDANSETRON HCL 4 MG PO TABS: 4.0000 mg | ORAL_TABLET | Freq: Four times a day (QID) | ORAL | Status: DC | PRN

## 2012-09-02 MED ORDER — ENOXAPARIN SODIUM 40 MG/0.4ML ~~LOC~~ SOLN: 40.0000 mg | SUBCUTANEOUS | Status: DC

## 2012-09-02 MED ORDER — ENOXAPARIN SODIUM 40 MG/0.4ML ~~LOC~~ SOLN
40.0000 mg | SUBCUTANEOUS | Status: DC
  Administered 2012-09-02: 40 mg via SUBCUTANEOUS
  Filled 2012-09-02 (×2): qty 0.4

## 2012-09-02 MED ORDER — ACETAMINOPHEN 650 MG RE SUPP: 650.0000 mg | Freq: Four times a day (QID) | RECTAL | Status: DC | PRN

## 2012-09-02 MED ORDER — POTASSIUM CHLORIDE CRYS ER 20 MEQ PO TBCR
40.0000 meq | EXTENDED_RELEASE_TABLET | Freq: Once | ORAL | Status: AC
  Administered 2012-09-02: 40 meq via ORAL
  Filled 2012-09-02: qty 2

## 2012-09-02 MED ORDER — ONDANSETRON HCL 4 MG/2ML IJ SOLN: 4.0000 mg | Freq: Four times a day (QID) | INTRAMUSCULAR | Status: AC | PRN

## 2012-09-02 MED ORDER — LEVOTHYROXINE SODIUM 175 MCG PO TABS
175.0000 ug | ORAL_TABLET | Freq: Every day | ORAL | Status: DC
  Administered 2012-09-03 – 2012-09-07 (×5): 175 ug via ORAL
  Filled 2012-09-02 (×6): qty 1

## 2012-09-02 MED ORDER — ALBUTEROL SULFATE (5 MG/ML) 0.5% IN NEBU: 2.5000 mg | INHALATION_SOLUTION | RESPIRATORY_TRACT | Status: DC | PRN

## 2012-09-02 MED ORDER — DONEPEZIL HCL 10 MG PO TABS
10.0000 mg | ORAL_TABLET | Freq: Every day | ORAL | Status: DC
  Administered 2012-09-02 – 2012-09-06 (×5): 10 mg via ORAL
  Filled 2012-09-02 (×7): qty 1

## 2012-09-02 MED ORDER — FUROSEMIDE 10 MG/ML IJ SOLN
40.0000 mg | Freq: Two times a day (BID) | INTRAMUSCULAR | Status: DC
  Administered 2012-09-02 – 2012-09-03 (×2): 40 mg via INTRAVENOUS
  Filled 2012-09-02 (×4): qty 4

## 2012-09-02 MED ORDER — POTASSIUM CHLORIDE CRYS ER 20 MEQ PO TBCR
20.0000 meq | EXTENDED_RELEASE_TABLET | Freq: Two times a day (BID) | ORAL | Status: DC
  Administered 2012-09-02 – 2012-09-07 (×10): 20 meq via ORAL
  Filled 2012-09-02 (×10): qty 1

## 2012-09-02 MED ORDER — SACCHAROMYCES BOULARDII 250 MG PO CAPS
250.0000 mg | ORAL_CAPSULE | Freq: Two times a day (BID) | ORAL | Status: DC
  Administered 2012-09-02 – 2012-09-07 (×10): 250 mg via ORAL
  Filled 2012-09-02 (×11): qty 1

## 2012-09-02 MED ORDER — ONDANSETRON HCL 4 MG/2ML IJ SOLN: 4.0000 mg | Freq: Four times a day (QID) | INTRAMUSCULAR | Status: DC | PRN

## 2012-09-02 MED ORDER — ONDANSETRON HCL 4 MG/2ML IJ SOLN
4.0000 mg | Freq: Once | INTRAMUSCULAR | Status: AC
  Administered 2012-09-02: 4 mg via INTRAVENOUS
  Filled 2012-09-02: qty 2

## 2012-09-02 MED ORDER — SIMVASTATIN 20 MG PO TABS
20.0000 mg | ORAL_TABLET | Freq: Every day | ORAL | Status: DC
  Administered 2012-09-02 – 2012-09-06 (×5): 20 mg via ORAL
  Filled 2012-09-02 (×6): qty 1

## 2012-09-02 MED ORDER — IPRATROPIUM BROMIDE 0.02 % IN SOLN: 0.5000 mg | RESPIRATORY_TRACT | Status: DC | PRN

## 2012-09-02 MED ORDER — SERTRALINE HCL 50 MG PO TABS
50.0000 mg | ORAL_TABLET | Freq: Every day | ORAL | Status: DC
  Administered 2012-09-02 – 2012-09-07 (×6): 50 mg via ORAL
  Filled 2012-09-02 (×6): qty 1

## 2012-09-02 MED ORDER — ADULT MULTIVITAMIN W/MINERALS CH
1.0000 | ORAL_TABLET | Freq: Every day | ORAL | Status: DC
  Administered 2012-09-02 – 2012-09-07 (×6): 1 via ORAL
  Filled 2012-09-02 (×6): qty 1

## 2012-09-02 MED ORDER — FUROSEMIDE 10 MG/ML IJ SOLN
40.0000 mg | Freq: Once | INTRAMUSCULAR | Status: AC
  Administered 2012-09-02: 40 mg via INTRAVENOUS
  Filled 2012-09-02: qty 4

## 2012-09-02 MED ORDER — LORATADINE 10 MG PO TABS
10.0000 mg | ORAL_TABLET | Freq: Every day | ORAL | Status: DC
  Administered 2012-09-02 – 2012-09-07 (×6): 10 mg via ORAL
  Filled 2012-09-02 (×6): qty 1

## 2012-09-02 MED ORDER — PANTOPRAZOLE SODIUM 40 MG PO TBEC
40.0000 mg | DELAYED_RELEASE_TABLET | Freq: Every day | ORAL | Status: DC
  Administered 2012-09-03 – 2012-09-07 (×5): 40 mg via ORAL
  Filled 2012-09-02 (×4): qty 1

## 2012-09-02 MED ORDER — PIPERACILLIN-TAZOBACTAM 3.375 G IVPB
3.3750 g | Freq: Three times a day (TID) | INTRAVENOUS | Status: DC
  Administered 2012-09-02 – 2012-09-06 (×11): 3.375 g via INTRAVENOUS
  Filled 2012-09-02 (×14): qty 50

## 2012-09-02 MED ORDER — SODIUM CHLORIDE 0.9 % IJ SOLN: 3.0000 mL | Freq: Two times a day (BID) | INTRAMUSCULAR | Status: DC

## 2012-09-02 MED ORDER — SODIUM CHLORIDE 0.9 % IV SOLN
250.0000 mL | INTRAVENOUS | Status: DC | PRN
Start: 2012-09-02 — End: 2012-09-07

## 2012-09-02 MED ORDER — SODIUM CHLORIDE 0.9 % IJ SOLN
3.0000 mL | Freq: Two times a day (BID) | INTRAMUSCULAR | Status: DC
  Administered 2012-09-02 – 2012-09-04 (×3): 3 mL via INTRAVENOUS

## 2012-09-02 MED ORDER — SODIUM CHLORIDE 0.9 % IJ SOLN
3.0000 mL | Freq: Two times a day (BID) | INTRAMUSCULAR | Status: DC
  Administered 2012-09-02 – 2012-09-07 (×10): 3 mL via INTRAVENOUS

## 2012-09-02 MED ORDER — VANCOMYCIN HCL IN DEXTROSE 1-5 GM/200ML-% IV SOLN
1000.0000 mg | Freq: Once | INTRAVENOUS | Status: AC
  Administered 2012-09-02: 1000 mg via INTRAVENOUS
  Filled 2012-09-02: qty 200

## 2012-09-02 MED ORDER — PIPERACILLIN-TAZOBACTAM 3.375 G IVPB
3.3750 g | Freq: Once | INTRAVENOUS | Status: AC
  Administered 2012-09-02: 3.375 g via INTRAVENOUS
  Filled 2012-09-02: qty 50

## 2012-09-02 MED ORDER — ALUM & MAG HYDROXIDE-SIMETH 200-200-20 MG/5ML PO SUSP: 30.0000 mL | Freq: Four times a day (QID) | ORAL | Status: DC | PRN

## 2012-09-02 MED ORDER — VANCOMYCIN HCL 10 G IV SOLR
1250.0000 mg | INTRAVENOUS | Status: DC
  Administered 2012-09-03: 1250 mg via INTRAVENOUS
  Filled 2012-09-02: qty 1250

## 2012-09-02 NOTE — ED Provider Notes (Signed)
History     CSN: [id number]  Arrival date & time 09/02/12  1204   First MD Initiated Contact with Patient 09/02/12 1215      Chief Complaint  Patient presents with  . Emesis    (Consider location/radiation/quality/duration/timing/severity/associated sxs/prior treatment) HPI Comments: Patient arrives via EMS with shortness of breath and nausea and vomiting. She is a poor historian secondary to dementia. She states she has not felt well for some time. No fever. Denies chest pain, abdominal pain EMS reports she's had 2 episodes of vomiting. She appears dyspneic and short of breath.  The history is provided by the EMS personnel. The history is limited by the condition of the patient.    Past Medical History  Diagnosis Date  . Peripheral neuropathy   . Hypertension   . TIA (transient ischemic attack)   . Elevated sed rate   . Temporal arteritis     chronic steroids  . Hypothyroidism   . Colon cancer     colon  . History of hernia repair   . S/P rotator cuff surgery     right  . Diastolic CHF   . Cardiomegaly - hypertensive   . Chronic anemia   . Chronic steroid use   . Dementia   . Shortness of breath   . Hyperlipidemia   . CHF (congestive heart failure)   . Cardiomyopathy due to hypertension   . Dependence on supplemental oxygen   . Chronic UTI   . Dementia     Past Surgical History  Procedure Date  . Tonsillectomy   . Appendectomy   . Abdominal hysterectomy   . Colectomy     No family history on file.  History  Substance Use Topics  . Smoking status: Never Smoker   . Smokeless tobacco: Not on file  . Alcohol Use: No    OB History    Grav Para Term Preterm Abortions TAB SAB Ect Mult Living                  Review of Systems  Unable to perform ROS: Dementia  Gastrointestinal: Positive for vomiting.    Allergies  Ciprofloxacin; Codeine; Demerol; Morphine and related; Nitrofurantoin monohyd macro; Pregabalin; and Septra  Home Medications    Current Outpatient Rx  Name  Route  Sig  Dispense  Refill  . ACETAMINOPHEN 325 MG PO TABS   Oral   Take 650 mg by mouth every 6 (six) hours as needed. For pain         . AMLODIPINE BESYLATE 5 MG PO TABS   Oral   Take 5 mg by mouth daily.         Marland Kitchen CETIRIZINE HCL 10 MG PO TABS   Oral   Take 10 mg by mouth daily.         Marland Kitchen CRANBERRY 200 MG PO CAPS   Oral   Take 200 mg by mouth 2 (two) times daily.         . ASPIRIN-DIPYRIDAMOLE ER 25-200 MG PO CP12   Oral   Take 1 capsule by mouth 2 (two) times daily.           . DONEPEZIL HCL 10 MG PO TABS   Oral   Take 10 mg by mouth at bedtime.          Marland Kitchen LEVOTHYROXINE SODIUM 175 MCG PO TABS   Oral   Take 175 mcg by mouth every morning.         Marland Kitchen  ADULT MULTIVITAMIN W/MINERALS CH   Oral   Take 1 tablet by mouth daily.           Marland Kitchen OMEPRAZOLE 20 MG PO CPDR   Oral   Take 20 mg by mouth daily.           Jeralyn Bennett CALCIUM 500 MG PO TABS   Oral   Take 500 mg of elemental calcium by mouth 2 (two) times daily.         Marland Kitchen CALCIUM POLYCARBOPHIL 625 MG PO TABS   Oral   Take 625 mg by mouth daily.         Marland Kitchen POTASSIUM CHLORIDE CRYS ER 20 MEQ PO TBCR   Oral   Take 20 mEq by mouth 2 (two) times daily.         Marland Kitchen PREDNISONE 5 MG PO TABS   Oral   Take 15 mg by mouth daily.         Marland Kitchen SACCHAROMYCES BOULARDII 250 MG PO CAPS   Oral   Take 250 mg by mouth 2 (two) times daily.         . SERTRALINE HCL 50 MG PO TABS   Oral   Take 50 mg by mouth daily.           Marland Kitchen SIMVASTATIN 20 MG PO TABS   Oral   Take 20 mg by mouth at bedtime.           . SULFAMETHOXAZOLE-TMP DS 800-160 MG PO TABS   Oral   Take 1 tablet by mouth 2 (two) times daily. Take for 3 days. Began on 12/6 and should complete on 12/8         . SULFAMETHOXAZOLE-TRIMETHOPRIM 400-80 MG PO TABS   Oral   Take 1 tablet by mouth at bedtime.         . TORSEMIDE 20 MG PO TABS   Oral   Take 60 mg by mouth daily.           BP 142/67  Pulse  71  Temp 100.4 F (38 C) (Rectal)  Resp 20  SpO2 98%  Physical Exam  Constitutional: She appears well-developed and well-nourished. She appears distressed.       Moderate respiratory distress, speaking in short sentences  HENT:  Head: Normocephalic and atraumatic.  Mouth/Throat: Oropharynx is clear and moist. No oropharyngeal exudate.  Eyes: Conjunctivae normal and EOM are normal. Pupils are equal, round, and reactive to light.  Neck: Normal range of motion. Neck supple.  Cardiovascular: Normal rate and regular rhythm.   Murmur heard. Pulmonary/Chest: She is in respiratory distress. She has rales.  Abdominal: Soft. There is no tenderness. There is no rebound and no guarding.  Musculoskeletal: Normal range of motion. She exhibits no edema and no tenderness.  Neurological: She is alert. No cranial nerve deficit. She exhibits normal muscle tone. Coordination normal.    ED Course  Procedures (including critical care time)  Labs Reviewed  CBC WITH DIFFERENTIAL - Abnormal; Notable for the following:    WBC 18.7 (*)     RDW 15.7 (*)     Neutro Abs 13.7 (*)     Lymphs Abs 4.3 (*)     All other components within normal limits  COMPREHENSIVE METABOLIC PANEL - Abnormal; Notable for the following:    Potassium 3.4 (*)     Chloride 93 (*)     Glucose, Bld 106 (*)     Creatinine, Ser 1.55 (*)  GFR calc non Af Amer 30 (*)     GFR calc Af Amer 35 (*)     All other components within normal limits  URINALYSIS, ROUTINE W REFLEX MICROSCOPIC - Abnormal; Notable for the following:    Nitrite POSITIVE (*)     Leukocytes, UA MODERATE (*)     All other components within normal limits  PRO B NATRIURETIC PEPTIDE - Abnormal; Notable for the following:    Pro B Natriuretic peptide (BNP) 1222.0 (*)     All other components within normal limits  URINE MICROSCOPIC-ADD ON - Abnormal; Notable for the following:    Bacteria, UA FEW (*)     All other components within normal limits  PROTIME-INR   TROPONIN I  URINE CULTURE  CULTURE, BLOOD (ROUTINE X 2)  CULTURE, BLOOD (ROUTINE X 2)  INFLUENZA PANEL BY PCR  PRO B NATRIURETIC PEPTIDE   Dg Chest Portable 1 View  09/02/2012  *RADIOLOGY REPORT*  Clinical Data: Emesis.  Congestive heart failure.  PORTABLE CHEST - 1 VIEW  Comparison: CT chest 05/14/2012.  Findings: Borderline cardiac enlargement is present.  There is no significant edema.  A small left pleural effusion is noted.  There is some associated airspace disease. The lungs are otherwise clear. The visualized soft tissues and bony thorax are unremarkable.  IMPRESSION:  1.  Borderline cardiomegaly without evidence for failure. 2.  Small left pleural effusion versus airspace disease.   Original Report Authenticated By: Marin Roberts, M.D.      1. HCAP (healthcare-associated pneumonia)   2. CHF (congestive heart failure)   3. Acute respiratory distress   4. Acute on chronic diastolic heart failure   5. Acute renal failure   6. Urinary tract infection       MDM  Respiratory distress and difficulty breathing with nausea and vomiting this morning. tachypneic on exam but states not SOB.  "feels weak" denies chest pain, fever, cough.  Still feels nauseated.  IV lasix for pulmonary congestion Appears to be multifactorial dyspnea with HCAP as well. Antibioitics started. +UA as well with fever and leukocytosis. Meets criteria for sepsis.  BP and mental status stable. Admission d/w Dr. Janee Morn.    Date: 09/02/2012  Rate: 117  Rhythm: sinus tachycardia  QRS Axis: normal  Intervals: normal  ST/T Wave abnormalities: nonspecific ST/T changes  Conduction Disutrbances:right bundle branch block  Narrative Interpretation:   Old EKG Reviewed: unchanged       Glynn Octave, MD 09/02/12 1836

## 2012-09-02 NOTE — ED Notes (Addendum)
Pt presents to department from [facility] via [facility] for evaluation of vomiting. Onset this morning after eating breakfast. Denies pain. She is conscious alert and oriented x4. Able to move all extremities. NSR on monitor. BP 138/70. Wears 2L O2 at home.

## 2012-09-02 NOTE — H&P (Signed)
[facility] History and Physical  [name] VHQ:[id number] DOB: 25-Jun-1930 DOA: 09/02/2012  Referring physician: Dr Manus Gunning PCP: Cain Saupe, MD  Specialists: None  Chief Complaint: emesis/SOB  HPI: [name] is a 78 y.o. female with past medical history of hypertension, TIA, hypothyroidism, diastolic CHF, dementia, recurrent chronic UTIs who is a nursing home resident who had presented to the ED on a nursing home with emesis chills and in acute respiratory distress. Patient is a poor historian and a such history was obtained from patient's daughter and the ED physician. The patient's daughter patient did have an eye done on Friday for probable UTI. Patient's daughter stated that on the day of admission the patient did have an episode of emesis and chills with some subsequent shortness of breath and subsequently brought to the ED. Endorse dysuria abdominal pain and generalized weakness and a nonproductive cough. The patient and daughter no fevers, no chest pain, some shortness of breath, no diarrhea, no constipation, no melena, no hematemesis, no hematochezia. On presentation to the ED per ED physician patient was in acute respiratory distress was given 40 of IV Lasix x1 with clinical improvement. Chest x-ray done was worrisome for pneumonia. CBC obtained did have a white count of approximately 18,000. Patient was given IV vancomycin and Zosyn will call to admit the patient for further evaluation and management.  Review of Systems: The patient denies anorexia, fever, weight loss,, vision loss, decreased hearing, hoarseness, chest pain, syncope, dyspnea on exertion, peripheral edema, balance deficits, hemoptysis, abdominal pain, melena, hematochezia, severe indigestion/heartburn, hematuria, incontinence, genital sores, muscle weakness, suspicious skin lesions, transient blindness, difficulty walking, depression, unusual weight change, abnormal bleeding, enlarged lymph nodes,  angioedema, and breast masses.   Past Medical History  Diagnosis Date  . Peripheral neuropathy   . Hypertension   . TIA (transient ischemic attack)   . Elevated sed rate   . Temporal arteritis     chronic steroids  . Hypothyroidism   . Colon cancer     colon  . History of hernia repair   . S/P rotator cuff surgery     right  . Diastolic CHF   . Cardiomegaly - hypertensive   . Chronic anemia   . Chronic steroid use   . Dementia   . Shortness of breath   . Hyperlipidemia   . CHF (congestive heart failure)   . Cardiomyopathy due to hypertension   . Dependence on supplemental oxygen   . Chronic UTI   . Dementia    Past Surgical History  Procedure Date  . Tonsillectomy   . Appendectomy   . Abdominal hysterectomy   . Colectomy    Social History:  reports that she has never smoked. She does not have any smokeless tobacco history on file. She reports that she does not drink alcohol or use illicit drugs.  Allergies  Allergen Reactions  . Ciprofloxacin Nausea And Vomiting  . Codeine Other (See Comments)    halucinations  . Demerol Other (See Comments)    halucinations  . Morphine And Related Other (See Comments)    halucinations  . Nitrofurantoin Monohyd Macro Other (See Comments)    Per MAR  . Pregabalin Other (See Comments)    unknown  . Septra (Bactrim) Other (See Comments)    Unknown cant tolerate DS    No family history on file.   Prior to Admission medications   Medication Sig Start Date End Date Taking? Authorizing Provider  acetaminophen (TYLENOL) 325 MG tablet  Take 650 mg by mouth every 6 (six) hours as needed. For pain   Yes Historical Provider, MD  amLODipine (NORVASC) 5 MG tablet Take 5 mg by mouth daily.   Yes Historical Provider, MD  cetirizine (ZYRTEC) 10 MG tablet Take 10 mg by mouth daily.   Yes Historical Provider, MD  Cranberry 200 MG CAPS Take 200 mg by mouth 2 (two) times daily.   Yes Historical Provider, MD  dipyridamole-aspirin (AGGRENOX)  25-200 MG per 12 hr capsule Take 1 capsule by mouth 2 (two) times daily.     Yes Historical Provider, MD  donepezil (ARICEPT) 10 MG tablet Take 10 mg by mouth at bedtime.    Yes Historical Provider, MD  levothyroxine (SYNTHROID, LEVOTHROID) 175 MCG tablet Take 175 mcg by mouth every morning.   Yes Historical Provider, MD  Multiple Vitamin (MULITIVITAMIN WITH MINERALS) TABS Take 1 tablet by mouth daily.     Yes Historical Provider, MD  omeprazole (PRILOSEC) 20 MG capsule Take 20 mg by mouth daily.     Yes Historical Provider, MD  [name] (OYSTER CALCIUM) 500 MG TABS Take 500 mg of elemental calcium by mouth 2 (two) times daily.   Yes Historical Provider, MD  polycarbophil (FIBERCON) 625 MG tablet Take 625 mg by mouth daily.   Yes Historical Provider, MD  potassium chloride SA (K-DUR,KLOR-CON) 20 MEQ tablet Take 20 mEq by mouth 2 (two) times daily.   Yes Historical Provider, MD  predniSONE (DELTASONE) 5 MG tablet Take 15 mg by mouth daily.   Yes Historical Provider, MD  saccharomyces boulardii (FLORASTOR) 250 MG capsule Take 250 mg by mouth 2 (two) times daily.   Yes Historical Provider, MD  sertraline (ZOLOFT) 50 MG tablet Take 50 mg by mouth daily.     Yes Historical Provider, MD  simvastatin (ZOCOR) 20 MG tablet Take 20 mg by mouth at bedtime.     Yes Historical Provider, MD  sulfamethoxazole-trimethoprim (BACTRIM DS) 800-160 MG per tablet Take 1 tablet by mouth 2 (two) times daily. Take for 3 days. Began on 12/6 and should complete on 12/8 08/31/12 09/02/12 Yes Historical Provider, MD  sulfamethoxazole-trimethoprim (BACTRIM,SEPTRA) 400-80 MG per tablet Take 1 tablet by mouth at bedtime.   Yes Historical Provider, MD  torsemide (DEMADEX) 20 MG tablet Take 60 mg by mouth daily.   Yes Historical Provider, MD   Physical Exam: Filed Vitals:   09/02/12 1456 09/02/12 1515 09/02/12 1605 09/02/12 1615  BP: 123/56 148/55  142/67  Pulse:  85  71  Temp: 99.1 F (37.3 C)  100.4 F (38 C)   TempSrc:  Oral  Rectal   Resp: 16 21  20   SpO2: 100% 93%  98%     General:  Well-developed well-nourished laying in bed in no acute cardiopulmonary distress. Speaking in full sentences.  Eyes: Pupils equal round and reactive to light and accommodation. Extraocular movements intact.  ENT: Oropharynx is clear, no lesions, no exudates  Neck: Supple with no lymphadenopathy  Cardiovascular: Regular rate rhythm no murmurs rubs or gallops. Bilateral lower extremities with 1-2+ edema.  Respiratory: Coarse breath sounds right greater than left. No crackles noted.  Abdomen: Soft, nondistended, tenderness to palpation in the suprapubic region, positive bowel sounds.  Skin: No rashes or lesions.  Musculoskeletal: 5 out of 5 bilateral upper extremity strength. 5 bilateral lower extremity strength.  Psychiatric: Unable to assess as patient is a poor historian with a history of dementia.  Neurologic: Alert. Following commands. Cranial nerves II through  XII are grossly intact. No focal deficits.  Labs on Admission:  Basic Metabolic Panel:  Lab 09/02/12 1610  NA 137  K 3.4*  CL 93*  CO2 26  GLUCOSE 106*  BUN 18  CREATININE 1.55*  CALCIUM 9.7  MG --  PHOS --   Liver Function Tests:  Lab 09/02/12 1228  AST 21  ALT 18  ALKPHOS 81  BILITOT 0.3  PROT 7.9  ALBUMIN 3.5   No results found for this basename: LIPASE:5,AMYLASE:5 in the last 168 hours No results found for this basename: AMMONIA:5 in the last 168 hours CBC:  Lab 09/02/12 1228  WBC 18.7*  NEUTROABS 13.7*  HGB 12.6  HCT 37.6  MCV 89.1  PLT 293   Cardiac Enzymes:  Lab 09/02/12 1228  CKTOTAL --  CKMB --  CKMBINDEX --  TROPONINI <0.30    BNP (last 3 results)  Basename 09/02/12 1228 05/16/12 0610 05/15/12 0625  PROBNP 1222.0* 4914.0* 6703.0*   CBG: No results found for this basename: GLUCAP:5 in the last 168 hours  Radiological Exams on Admission: Dg Chest Portable 1 View  09/02/2012  *RADIOLOGY REPORT*   Clinical Data: Emesis.  Congestive heart failure.  PORTABLE CHEST - 1 VIEW  Comparison: CT chest 05/14/2012.  Findings: Borderline cardiac enlargement is present.  There is no significant edema.  A small left pleural effusion is noted.  There is some associated airspace disease. The lungs are otherwise clear. The visualized soft tissues and bony thorax are unremarkable.  IMPRESSION:  1.  Borderline cardiomegaly without evidence for failure. 2.  Small left pleural effusion versus airspace disease.   Original Report Authenticated By: Marin Roberts, M.D.     EKG: Independently reviewed. Right bundle branch block unchanged.  Assessment/Plan Principal Problem:  *Acute respiratory distress Active Problems:  Hypothyroidism  Hyperlipidemia  Depression  Dementia  Chronic anemia  Diastolic CHF  Hypertension  Acute exacerbation of CHF (congestive heart failure)  Acute on chronic diastolic heart failure  HCAP (healthcare-associated pneumonia)  Hypokalemia  Leukocytosis  ARF (acute renal failure)   #1 acute respiratory distress Likely multifactorial in nature secondary to healthcare associated pneumonia in the setting of acute on chronic diastolic CHF. Patient was given a dose of IV Lasix in the ED with clinical improvement significantly per ED physician. BNP is elevated at 1222. Patient does have some lower extremity edema. We'll cycle cardiac enzymes every 6 hours x3. Patient recent 2-D echo done in August of 2013 and a such we'll not repeat this. Will check a urine Legionella and urine pneumococcus antigen. Check a sputum Gram stain and culture. Placed empirically on IV vancomycin and Zosyn. Follow.  #2 probable healthcare associated pneumonia Patient does live at an assisted living facility was last hospitalized in August of 2013. Patient is present with emesis, chest x-ray worrisome for possible pneumonia. Patient does have a leukocytosis with a white count of 18.7 and a left shift. Will  check a sputum Gram stain and culture. Check a urine Legionella and pneumococcus antigen. Will check blood cultures x2. Placed empirically on IV vancomycin and Zosyn. Oxygen, nebs as needed.  #3 acute on chronic diastolic CHF Patient does have an elevated BNP of 1222. Patient did have some lower extremity edema on exam. Patient improved clinically with diuresis. Will diuresis gently with Lasix 40 mg IV every 12 hours. Cycle cardiac enzymes every 6 hours x3. Will not a repeat a 2-D echo as patient had one done in August of 2013. Strict I/O. Daily weights.  Continue home regimen of Zocor. Follow.  #4 hypothyroidism Check a TSH. Continue home dose Synthroid.  #5 leukocytosis Likely secondary to problem #2. Will check blood cultures x2. Check a UA with cultures and sensitivities. Sputum Gram stain and cultures are pending. Continue empiric IV vancomycin and Zosyn. Follow.  #6 hypokalemia Will replete. Check a magnesium level.  #7 questionable acute renal failure Patient does have a creatinine of 1.55. During hospitalization in August last creatinine on 05/24/2012 was 1.51. Creatinine 05/12/2012 was 0.96. Will check a urine sodium and a urine creatinine. Will check a renal ultrasound. Place a Foley catheter. Follow.  #8 dementia Continue home regimen.  #9 hypertension Stable. Follow. Resume home regimen.  #11 chronic steroid use We'll place on double home regimen of prednisone.  #12 history of TIA Continue home regimen of Aggrenox.  #13 prophylaxis PPI for GI prophylaxis. Lovenox for DVT prophylaxis.  Code Status: Full Family Communication: Updated patient and daughter at bedside. Disposition Plan: Admit to telemetry  Time spent: 70 mins  [facility] Pager [phone]  If 7PM-7AM, please contact night-coverage [email or web address] Password [facility] 09/02/2012, 4:37 PM

## 2012-09-02 NOTE — Progress Notes (Signed)
ANTIBIOTIC CONSULT NOTE - INITIAL  Pharmacy Consult for Vancomycin, Zosyn,   Indication: pneumonia  Allergies  Allergen Reactions  . Ciprofloxacin Nausea And Vomiting  . Codeine Other (See Comments)    halucinations  . Demerol Other (See Comments)    halucinations  . Morphine And Related Other (See Comments)    halucinations  . Nitrofurantoin Monohyd Macro Other (See Comments)    Per MAR  . Pregabalin Other (See Comments)    unknown  . Septra (Bactrim) Other (See Comments)    Unknown cant tolerate DS    Patient Measurements: Height: 5\' 9"  (175.3 cm) Weight: 186 lb 4.6 oz (84.5 kg) IBW/kg (Calculated) : 66.2   Vital Signs: Temp: 98.3 F (36.8 C) (12/08 1807) Temp src: Oral (12/08 1807) BP: 163/62 mmHg (12/08 1807) Pulse Rate: 73  (12/08 1807) Intake/Output from previous day:   Intake/Output from this shift:    Labs:  [facility] 09/02/12 1816 09/02/12 1228  WBC 10.8* 18.7*  HGB 11.1* 12.6  PLT 251 293  LABCREA -- --  CREATININE 1.65* 1.55*   Estimated Creatinine Clearance: 31.6 ml/min (by C-G formula based on Cr of 1.65). No results found for this basename: VANCOTROUGH:2,VANCOPEAK:2,VANCORANDOM:2,GENTTROUGH:2,GENTPEAK:2,GENTRANDOM:2,TOBRATROUGH:2,TOBRAPEAK:2,TOBRARND:2,AMIKACINPEAK:2,AMIKACINTROU:2,AMIKACIN:2, in the last 72 hours   Microbiology: No results found for this or any previous visit (from the past 720 hour(s)).  Medical History: Past Medical History  Diagnosis Date  . Peripheral neuropathy   . Hypertension   . TIA (transient ischemic attack)   . Elevated sed rate   . Temporal arteritis     chronic steroids  . Hypothyroidism   . Colon cancer     colon  . History of hernia repair   . S/P rotator cuff surgery     right  . Diastolic CHF   . Cardiomegaly - hypertensive   . Chronic anemia   . Chronic steroid use   . Dementia   . Shortness of breath   . Hyperlipidemia   . CHF (congestive heart failure)   . Cardiomyopathy due to hypertension    . Dependence on supplemental oxygen   . Chronic UTI   . Dementia     Assessment: 82yof with extensive PMH from SNF admitted with emesis, chills, dysuria and respiratory distress.  She will be treated with broad spectrum ABX for UTI/PNA  Goal of Therapy:  Vancomycin trough level 15-20 mcg/ml  Plan:  Zosyn 3.375 q8h EI Vancomycin 1250 q24 start 12/9 already had dose in ED 12/8  Marcelino Scot 09/02/2012,8:33 PM

## 2012-09-03 DIAGNOSIS — F0390 Unspecified dementia without behavioral disturbance: Secondary | ICD-10-CM

## 2012-09-03 DIAGNOSIS — N390 Urinary tract infection, site not specified: Secondary | ICD-10-CM

## 2012-09-03 LAB — COMPREHENSIVE METABOLIC PANEL
Alkaline Phosphatase: 70 U/L (ref 39–117)
BUN: 22 mg/dL (ref 6–23)
Calcium: 9.1 mg/dL (ref 8.4–10.5)
GFR calc Af Amer: 30 mL/min — ABNORMAL LOW (ref >90)
GFR calc non Af Amer: 26 mL/min — ABNORMAL LOW (ref >90)
Glucose, Bld: 84 mg/dL (ref 70–99)
Potassium: 3.7 mEq/L (ref 3.5–5.1)
Total Protein: 6.8 g/dL (ref 6.0–8.3)

## 2012-09-03 LAB — CBC WITH DIFFERENTIAL/PLATELET
Basophils Absolute: 0 10*3/uL (ref 0.0–0.1)
HCT: 35.7 % — ABNORMAL LOW (ref 36.0–46.0)
Lymphocytes Relative: 27 % (ref 12–46)
Monocytes Absolute: 0.5 10*3/uL (ref 0.1–1.0)
Neutro Abs: 5.7 10*3/uL (ref 1.7–7.7)
Neutrophils Relative %: 66 % (ref 43–77)
RDW: 16.2 % — ABNORMAL HIGH (ref 11.5–15.5)
WBC: 8.6 10*3/uL (ref 4.0–10.5)

## 2012-09-03 LAB — LEGIONELLA ANTIGEN, URINE

## 2012-09-03 LAB — CK TOTAL AND CKMB (NOT AT ARMC)
CK, MB: 2.2 ng/mL (ref 0.3–4.0)
Relative Index: INVALID (ref 0.0–2.5)
Relative Index: INVALID (ref 0.0–2.5)
Relative Index: INVALID (ref 0.0–2.5)
Total CK: 37 U/L (ref 7–177)

## 2012-09-03 LAB — SODIUM, URINE, RANDOM: Sodium, Ur: 85 mEq/L

## 2012-09-03 LAB — TSH: TSH: 0.067 u[IU]/mL — ABNORMAL LOW (ref 0.350–4.500)

## 2012-09-03 LAB — TROPONIN I
Troponin I: <0.3 ng/mL (ref <0.30)
Troponin I: <0.3 ng/mL (ref <0.30)

## 2012-09-03 LAB — STREP PNEUMONIAE URINARY ANTIGEN: Strep Pneumo Urinary Antigen: NEGATIVE

## 2012-09-03 LAB — INFLUENZA PANEL BY PCR (TYPE A & B): Influenza A By PCR: NEGATIVE

## 2012-09-03 LAB — CREATININE, URINE, RANDOM: Creatinine, Urine: 32.55 mg/dL

## 2012-09-03 MED ORDER — FUROSEMIDE 40 MG PO TABS
40.0000 mg | ORAL_TABLET | Freq: Every day | ORAL | Status: DC
  Administered 2012-09-04: 40 mg via ORAL
  Filled 2012-09-03: qty 1

## 2012-09-03 MED ORDER — LOPERAMIDE HCL 2 MG PO CAPS
2.0000 mg | ORAL_CAPSULE | ORAL | Status: DC | PRN
  Administered 2012-09-03: 2 mg via ORAL
  Filled 2012-09-03: qty 1

## 2012-09-03 MED ORDER — FUROSEMIDE 10 MG/ML IJ SOLN: 40.0000 mg | Freq: Every day | INTRAMUSCULAR | Status: DC

## 2012-09-03 MED ORDER — ENOXAPARIN SODIUM 30 MG/0.3ML ~~LOC~~ SOLN
30.0000 mg | SUBCUTANEOUS | Status: DC
  Administered 2012-09-03 – 2012-09-06 (×4): 30 mg via SUBCUTANEOUS
  Filled 2012-09-03 (×5): qty 0.3

## 2012-09-03 NOTE — Evaluation (Addendum)
Physical Therapy Evaluation Patient Details Name: [name] MRN: [id number] DOB: 1930/05/31 Today's Date: 09/03/2012 Time: 4098-1191 PT Time Calculation (min): 40 min  PT Assessment / Plan / Recommendation Clinical Impression  Ms. Calzadilla is 78 y/o female admitted for emesis/SOB. Currently being treated for UTI and PNA. Presents to PT today with generalized weakness and balance deficits limiting her functional independence and mobility. Will benefit physical therapy in the acute setting to maximize mobility and decrease BOC at next venue. Pt has a preference for a right lean in standing and sitting and had difficulty sequencing movement to the right. Spoke with Dr. Jomarie Longs about my concerns and she will evaluate.     PT Assessment  Patient needs continued PT services    Follow Up Recommendations  CIR    Does the patient have the potential to tolerate intense rehabilitation      Barriers to Discharge        Equipment Recommendations  None recommended by PT    Recommendations for Other Services     Frequency Min 3X/week    Precautions / Restrictions Precautions Precautions: Fall Restrictions Weight Bearing Restrictions: No         Mobility  Bed Mobility Bed Mobility: Supine to Sit;Sitting - Scoot to Edge of Bed Supine to Sit: 3: Mod assist;HOB flat Sitting - Scoot to Edge of Bed: 3: Mod assist Details for Bed Mobility Assistance: sequencing cues and follow through assist, pt falling to the right and fatiguing quickly needing facilitation for tall posture and scooting hips with pad  Transfers Transfers: Sit to Stand;Stand to Sit;Stand Pivot Transfers (x2) Sit to Stand: 3: Mod assist;With upper extremity assist;From bed Stand to Sit: 3: Mod assist;With upper extremity assist;To bed;To chair/3-in-1;With armrests Stand Pivot Transfers: 1: +2 Total assist Stand Pivot Transfers: Patient Percentage: 50% Details for Transfer Assistance: cues for safe hand placement and  body positioning; faciliation to bring trunk anteriorly over feet as well as facilitation to power up and maintain midline (stands with right/posterior lean); SPT bed->recliner and pt needing mod sequencing cues as well as stability assist bilaterally, appears to have difficulty sequencing her feet to pivot step to the right Ambulation/Gait Ambulation/Gait Assistance: Not tested (comment)              PT Diagnosis: Difficulty walking;Abnormality of gait;Generalized weakness;Altered mental status  PT Problem List: Decreased strength;Decreased activity tolerance;Decreased balance;Decreased mobility;Decreased coordination;Cardiopulmonary status limiting activity;Decreased cognition;Decreased knowledge of use of DME;Decreased safety awareness PT Treatment Interventions: DME instruction;Gait training;Functional mobility training;Therapeutic activities;Therapeutic exercise;Balance training;Neuromuscular re-education;Patient/family education;Cognitive remediation   PT Goals Acute Rehab PT Goals PT Goal Formulation: With patient Time For Goal Achievement: 09/10/12 Potential to Achieve Goals: Good Pt will Roll Supine to Right Side: with supervision PT Goal: Rolling Supine to Right Side - Progress: Goal set today Pt will Roll Supine to Left Side: with supervision PT Goal: Rolling Supine to Left Side - Progress: Goal set today Pt will go Supine/Side to Sit: with supervision PT Goal: Supine/Side to Sit - Progress: Goal set today Pt will Sit at Edge of Bed: with supervision;with bilateral upper extremity support;3-5 min PT Goal: Sit at Edge Of Bed - Progress: Goal set today Pt will go Sit to Supine/Side: with supervision PT Goal: Sit to Supine/Side - Progress: Goal set today Pt will go Sit to Stand: with supervision PT Goal: Sit to Stand - Progress: Goal set today Pt will go Stand to Sit: with supervision PT Goal: Stand to Sit - Progress: Goal set today  Pt will Transfer Bed to Chair/Chair to Bed:  with supervision PT Transfer Goal: Bed to Chair/Chair to Bed - Progress: Goal set today Pt will Stand: with min assist;with bilateral upper extremity support;3 - 5 min PT Goal: Stand - Progress: Goal set today Pt will Ambulate: 16 - 50 feet;with min assist;with rolling walker PT Goal: Ambulate - Progress: Goal set today Pt will Perform Home Exercise Program: with supervision, verbal cues required/provided PT Goal: Perform Home Exercise Program - Progress: Goal set today  Visit Information  Last PT Received On: 09/03/12 Assistance Needed: +2    Subjective Data  Subjective: You made me tired!    Prior Functioning  Home Living Available Help at Discharge: Available 24 hours/day Type of Home: Assisted living Home Access: Level entry Home Layout: One level Bathroom Shower/Tub: Health visitor: Handicapped height Home Adaptive Equipment: Shower chair with back;Walker - rolling;Grab bars around toilet;Grab bars in shower Prior Function Level of Independence: Needs assistance Needs Assistance: Bathing;Dressing;Gait;Transfers Bath: Minimal Dressing: Minimal Gait Assistance: supervision Transfer Assistance: supervision Able to Take Stairs?: No Driving: No Vocation: Retired Musician: No difficulties    Cognition  Overall Cognitive Status: History of cognitive impairments - at baseline (h/o dementia) Area of Impairment: Memory;Problem solving;Awareness of deficits Arousal/Alertness: Awake/alert Orientation Level: Disoriented to;Time;Situation Behavior During Session: WFL for tasks performed Cognition - Other Comments: very pleasant    Extremity/Trunk Assessment Right Upper Extremity Assessment RUE ROM/Strength/Tone: Deficits RUE ROM/Strength/Tone Deficits: 2 years ago pt with rotator cuff repair limiting shoulder range RUE Sensation: WFL - Light Touch Left Upper Extremity Assessment LUE ROM/Strength/Tone: WFL for tasks assessed Right Lower  Extremity Assessment RLE ROM/Strength/Tone: Deficits RLE ROM/Strength/Tone Deficits: grossly 4-/5 Left Lower Extremity Assessment LLE ROM/Strength/Tone: Deficits LLE ROM/Strength/Tone Deficits: grossly 4-/5 Trunk Assessment Trunk Assessment: Kyphotic   Balance Balance Balance Assessed: Yes Static Sitting Balance Static Sitting - Balance Support: Bilateral upper extremity supported Static Sitting - Level of Assistance: 4: Min assist;3: Mod assist Static Sitting - Comment/# of Minutes: sitting EOB 5 minutes pt unable to achieve or maintain midline posture without min-modA as she kept falling to the right and posteriorly, appears aware that she is falling but shows minimal attempts to correct Static Standing Balance Static Standing - Balance Support: Bilateral upper extremity supported Static Standing - Level of Assistance: 3: Mod assist Static Standing - Comment/# of Minutes: mod facilitation for midline and tall posture, pt with heavy posterior/lateral left lean with decreased proprioceptive awareness or attempts to correct  End of Session PT - End of Session Equipment Utilized During Treatment: Gait belt Activity Tolerance: Patient tolerated treatment well Patient left: in chair;with call bell/phone within reach Nurse Communication: Mobility status  GP     [facility] HELEN 09/03/2012, 2:55 PM

## 2012-09-03 NOTE — Progress Notes (Signed)
Pt has had four BM's since 0700.  Pts buttock is now red and painful.  Dr. Jomarie Longs text/paged.

## 2012-09-03 NOTE — Progress Notes (Signed)
Occupational Therapy Evaluation Patient Details Name: [name] MRN: [id number] DOB: 1929-10-03 Today's Date: 09/03/2012 Time: 1478-2956 OT Time Calculation (min): 28 min  OT Assessment / Plan / Recommendation Clinical Impression  78 yo resident of ALF [facility]), who was admitted due to emesis ,chills and respiratory distress. PTA, pt required a with B/D and at times with toileting and mobility. Pt used RW for [employer]. Pt demonstrates a decrease in functional status. Feel pt would benefit from rehab at SNF prior to return to ALF. Pt will benefit from skilled OT services to max independence with /adL and functional mobility for ADL to facilitate D/C to next venue due to below deficits.    OT Assessment  Patient needs continued OT Services    Follow Up Recommendations  SNF    Barriers to Discharge None    Equipment Recommendations  None recommended by OT    Recommendations for Other Services  none  Frequency  Min 2X/week    Precautions / Restrictions Precautions Precautions: Fall Restrictions Weight Bearing Restrictions: No   Pertinent Vitals/Pain  No c/o pain. Pt c/o not feeling well.   ADL  Eating/Feeding: Modified independent Where Assessed - Eating/Feeding: Edge of bed Grooming: Minimal assistance Where Assessed - Grooming: Unsupported sitting Upper Body Bathing: Minimal assistance Where Assessed - Upper Body Bathing: Unsupported sitting Lower Body Bathing: +1 Total assistance Where Assessed - Lower Body Bathing: Supported sit to stand Upper Body Dressing: Moderate assistance Where Assessed - Upper Body Dressing: Unsupported sitting Lower Body Dressing: +1 Total assistance Where Assessed - Lower Body Dressing: Supported sit to Pharmacist, hospital: +2 Total assistance Toilet Transfer: Patient Percentage: 50% Toilet Transfer Method: Sit to stand;Stand pivot Acupuncturist: Other (comment) (bed - chair) Toileting - Clothing Manipulation and  Hygiene: +1 Total assistance Where Assessed - Toileting Clothing Manipulation and Hygiene: Sit to stand from 3-in-1 or toilet Transfers/Ambulation Related to ADLs: Total A +2 for safety. R preference ADL Comments: limited primarily by weakness/fatigue    OT Diagnosis: Generalized weakness;Altered mental status  OT Problem List: Decreased strength;Decreased activity tolerance;Impaired balance (sitting and/or standing);Decreased cognition;Decreased safety awareness;Decreased knowledge of use of DME or AE;Cardiopulmonary status limiting activity;Obesity OT Treatment Interventions: Self-care/ADL training;Therapeutic exercise;DME and/or AE instruction;Energy conservation;Therapeutic activities;Cognitive remediation/compensation;Patient/family education;Balance training   OT Goals Acute Rehab OT Goals OT Goal Formulation: With patient Time For Goal Achievement: 09/17/12 Potential to Achieve Goals: Good ADL Goals Pt Will Perform Grooming: with set-up;with supervision;Sitting, edge of bed ADL Goal: Grooming - Progress: Goal set today Pt Will Perform Upper Body Bathing: with set-up;with supervision;Sitting, edge of bed;Unsupported ADL Goal: Upper Body Bathing - Progress: Goal set today Pt Will Perform Lower Body Bathing: with mod assist;Sit to stand from chair;Supported;with adaptive equipment;with cueing (comment type and amount) ADL Goal: Lower Body Bathing - Progress: Goal set today Pt Will Perform Upper Body Dressing: with set-up;with supervision;Unsupported ADL Goal: Upper Body Dressing - Progress: Goal set today Pt Will Transfer to Toilet: with min assist;Ambulation;with DME;3-in-1 ADL Goal: Toilet Transfer - Progress: Goal set today Pt Will Perform Toileting - Clothing Manipulation: with supervision;Sitting on 3-in-1 or toilet;Standing;with cueing (comment type and amount) ADL Goal: Toileting - Clothing Manipulation - Progress: Goal set today Pt Will Perform Toileting - Hygiene: with  supervision;Leaning right and/or left on 3-in-1/toilet;Sit to stand from 3-in-1/toilet ADL Goal: Toileting - Hygiene - Progress: Goal set today  Visit Information  Last OT Received On: 09/03/12 Assistance Needed: +2    Subjective Data  Prior Functioning     Home Living Lives With: Other (Comment) (ALF - [facility]) Available Help at Discharge: Available 24 hours/day Type of Home: Assisted living Home Access: Level entry Home Layout: One level Bathroom Shower/Tub: Health visitor: Handicapped height Bathroom Accessibility: Yes How Accessible: Accessible via wheelchair;Accessible via walker Home Adaptive Equipment: Shower chair with back;Walker - rolling;Grab bars around toilet;Grab bars in shower Prior Function Level of Independence: Needs assistance Needs Assistance: Bathing;Dressing;Toileting;Meal Prep;Light Housekeeping;Transfers Bath: Minimal Dressing: Minimal Toileting: Minimal Meal Prep: Total Light Housekeeping: Total Gait Assistance: supervision Transfer Assistance: supervision Able to Take Stairs?: No Driving: No Vocation: Retired Musician: No difficulties Dominant Hand: Right         Vision/Perception  WFL   Cognition  Overall Cognitive Status: History of cognitive impairments - at baseline Area of Impairment: Memory;Problem solving;Awareness of deficits Arousal/Alertness: Awake/alert Orientation Level: Disoriented to;Time Behavior During Session: WFL for tasks performed Memory: Decreased recall of precautions Problem Solving: min A functional basic Cognition - Other Comments: very pleasant    Extremity/Trunk Assessment Right Upper Extremity Assessment RUE ROM/Strength/Tone: Deficits RUE ROM/Strength/Tone Deficits: 2 years ago pt with rotator cuff repair limiting shoulder range RUE Sensation: WFL - Light Touch;WFL - Proprioception RUE Coordination: WFL - fine motor Left Upper Extremity Assessment LUE  ROM/Strength/Tone: WFL for tasks assessed LUE Sensation: WFL - Light Touch;WFL - Proprioception LUE Coordination: WFL - gross/fine motor Right Lower Extremity Assessment RLE ROM/Strength/Tone: Deficits RLE ROM/Strength/Tone Deficits:  (general waekness) RLE Sensation: WFL - Light Touch;WFL - Proprioception RLE Coordination: WFL - gross/fine motor Left Lower Extremity Assessment LLE ROM/Strength/Tone: Deficits LLE ROM/Strength/Tone Deficits: general waekness LLE Sensation: WFL - Light Touch;WFL - Proprioception LLE Coordination: WFL - gross/fine motor Trunk Assessment Trunk Assessment: Normal     Mobility Bed Mobility Bed Mobility: Supine to Sit;Sitting - Scoot to Edge of Bed Supine to Sit: 3: Mod assist;HOB flat Sitting - Scoot to Edge of Bed: 3: Mod assist Details for Bed Mobility Assistance: sequencing cues and follow through assist, pt falling to the right and fatiguing quickly needing facilitation for tall posture and scooting hips with pad  Transfers Sit to Stand: 3: Mod assist;With upper extremity assist;From bed Stand to Sit: 3: Mod assist;With upper extremity assist;To bed;To chair/3-in-1;With armrests Details for Transfer Assistance: /ruires B stability to keep upright posture   Shoulder Instructions     Exercise     Balance Balance Balance Assessed: Yes Static Sitting Balance Static Sitting - Balance Support: Right upper extremity supported;Feet supported (supporting with RUE. R preference of lean) Static Sitting - Level of Assistance: 5: Stand by assistance Static Sitting - Comment/# of Minutes: sitting EOB 5 minutes pt unable to achieve or maintain midline posture without min-modA as she kept falling to the right and posteriorly, appears aware that she is falling but shows minimal attempts to correct Static Standing Balance Static Standing - Balance Support: Bilateral upper extremity supported Static Standing - Level of Assistance: 3: Mod assist Static Standing -  Comment/# of Minutes: mod facilitation for midline and tall posture, pt with heavy posterior/lateral left lean with decreased proprioceptive awareness or attempts to correct   End of Session OT - End of Session Equipment Utilized During Treatment: Gait belt Activity Tolerance: Patient limited by fatigue Patient left: in bed;with call bell/phone within reach;with bed alarm set Nurse Communication: Mobility status  GO     Ladarrious Kirksey,HILLARY 09/03/2012, 3:29 PM [facility], OTR/L  [phone] 09/03/2012

## 2012-09-03 NOTE — Progress Notes (Signed)
Rehab Admissions Coordinator Note:  Patient was screened by Clois Dupes for appropriateness for an Inpatient Acute Rehab Consult. Reviewed both PT and OT evaluations. At this time, we are recommending Skilled Nursing Facility.  Clois Dupes, RN 09/03/2012, 7:44 PM  I can be reached at [phone]

## 2012-09-03 NOTE — Progress Notes (Signed)
[facility]             Progress Note   Subjective: Feels ok, breathing better, still with some burning micturition  Objective: Vital signs in last 24 hours: Temp:  [98.3 F (36.8 C)-100.4 F (38 C)] 99.1 F (37.3 C) (12/09 0550) Pulse Rate:  [71-85] 78  (12/09 0550) Resp:  [13-21] 18  (12/09 0550) BP: (123-163)/(50-67) 130/58 mmHg (12/09 0550) SpO2:  [93 %-100 %] 97 % (12/09 0222) Weight:  [83.553 kg (184 lb 3.2 oz)-84.5 kg (186 lb 4.6 oz)] 83.553 kg (184 lb 3.2 oz) (12/09 0550) Weight change:  Last BM Date: 09/03/12  Intake/Output from previous day: 12/08 0701 - 12/09 0700 In: -  Out: 300 [Urine:300] Total I/O In: 120 [P.O.:120] Out: -    Physical Exam: General: Alert, awake, oriented x3, in no acute distress. HEENT: No bruits, no goiter. Heart: Regular rate and rhythm, without murmurs, rubs, gallops. Lungs: Clear to auscultation bilaterally. Abdomen: Soft, nontender, nondistended, positive bowel sounds. Extremities: trace edema, No clubbing cyanosis with positive pedal pulses. Neuro: Grossly intact, nonfocal.    Lab Results: Basic Metabolic Panel:  Basename 09/03/12 0644 09/02/12 1816 09/02/12 1228  NA 134* -- 137  K 3.7 -- 3.4*  CL 94* -- 93*  CO2 29 -- 26  GLUCOSE 84 -- 106*  BUN 22 -- 18  CREATININE 1.78* 1.65* --  CALCIUM 9.1 -- 9.7  MG -- 1.9 --  PHOS -- -- --   Liver Function Tests:  [facility] 09/03/12 0644 09/02/12 1228  AST 18 21  ALT 16 18  ALKPHOS 70 81  BILITOT 0.4 0.3  PROT 6.8 7.9  ALBUMIN 2.9* 3.5   No results found for this basename: LIPASE:2,AMYLASE:2 in the last 72 hours No results found for this basename: AMMONIA:2 in the last 72 hours CBC:  Basename 09/03/12 0644 09/02/12 1816 09/02/12 1228  WBC 8.6 10.8* --  NEUTROABS 5.7 -- 13.7*  HGB 11.6* 11.1* --  HCT 35.7* 33.8* --  MCV 89.7 88.9 --  PLT 265 251 --   Cardiac Enzymes:  Basename 09/03/12 0644 09/03/12 0022 09/02/12 1816  CKTOTAL 37 32 26  CKMB  2.2 2.1 1.7  CKMBINDEX -- -- --  TROPONINI <0.30 <0.30 <0.30   BNP:  Basename 09/03/12 0644 09/02/12 1228  PROBNP 936.0* 1222.0*   D-Dimer: No results found for this basename: DDIMER:2 in the last 72 hours CBG: No results found for this basename: GLUCAP:6 in the last 72 hours Hemoglobin A1C: No results found for this basename: HGBA1C in the last 72 hours Fasting Lipid Panel: No results found for this basename: CHOL,HDL,LDLCALC,TRIG,CHOLHDL,LDLDIRECT in the last 72 hours Thyroid Function Tests:  Basename 09/02/12 1816  TSH 0.067*  T4TOTAL --  FREET4 --  T3FREE --  THYROIDAB --   Anemia Panel: No results found for this basename: VITAMINB12,FOLATE,FERRITIN,TIBC,IRON,RETICCTPCT in the last 72 hours Coagulation:  Basename 09/02/12 1228  LABPROT 12.2  INR 0.91   Urine Drug Screen: Drugs of Abuse  No results found for this basename: labopia, cocainscrnur, labbenz, amphetmu, thcu, labbarb    Alcohol Level: No results found for this basename: ETH:2 in the last 72 hours Urinalysis:  Basename 09/02/12 1609  COLORURINE YELLOW  LABSPEC 1.010  PHURINE 8.0  GLUCOSEU NEGATIVE  HGBUR NEGATIVE  BILIRUBINUR NEGATIVE  KETONESUR NEGATIVE  PROTEINUR NEGATIVE  UROBILINOGEN 0.2  NITRITE POSITIVE*  LEUKOCYTESUR MODERATE*    Recent Results (from the past 240 hour(s))  CULTURE, BLOOD (ROUTINE X 2)  Status: Normal (Preliminary result)   Collection Time   09/02/12  3:25 PM      Component Value Range Status Comment   Specimen Description BLOOD RIGHT HAND   Final    Special Requests BOTTLES DRAWN AEROBIC AND ANAEROBIC 3CC   Final    Culture  Setup Time 09/02/2012 20:57   Final    Culture     Final    Value:        BLOOD CULTURE RECEIVED NO GROWTH TO DATE CULTURE WILL BE HELD FOR 5 DAYS BEFORE ISSUING A FINAL NEGATIVE REPORT   Report Status PENDING   Incomplete   CULTURE, BLOOD (ROUTINE X 2)     Status: Normal (Preliminary result)   Collection Time   09/02/12  3:30 PM       Component Value Range Status Comment   Specimen Description BLOOD LEFT HAND   Final    Special Requests BOTTLES DRAWN AEROBIC ONLY 4CC   Final    Culture  Setup Time 09/02/2012 20:57   Final    Culture     Final    Value:        BLOOD CULTURE RECEIVED NO GROWTH TO DATE CULTURE WILL BE HELD FOR 5 DAYS BEFORE ISSUING A FINAL NEGATIVE REPORT   Report Status PENDING   Incomplete     Studies/Results: Dg Chest Portable 1 View  09/02/2012  *RADIOLOGY REPORT*  Clinical Data: Emesis.  Congestive heart failure.  PORTABLE CHEST - 1 VIEW  Comparison: CT chest 05/14/2012.  Findings: Borderline cardiac enlargement is present.  There is no significant edema.  A small left pleural effusion is noted.  There is some associated airspace disease. The lungs are otherwise clear. The visualized soft tissues and bony thorax are unremarkable.  IMPRESSION:  1.  Borderline cardiomegaly without evidence for failure. 2.  Small left pleural effusion versus airspace disease.   Original Report Authenticated By: Marin Roberts, M.D.    Dg Abd Portable 1v  09/02/2012  *RADIOLOGY REPORT*  Clinical Data: Abdominal pain.  PORTABLE ABDOMEN - 1 VIEW  Comparison: 01/24/2011.  Findings: Cholecystectomy clips are present in the right upper quadrant.  Nonobstructive bowel gas pattern.  Calcified phleboliths in the anatomic pelvis.  Stool and bowel gas are present within the rectosigmoid.  No plain film evidence of free air on this supine radiograph.  IMPRESSION: No acute abnormality.  Nonobstructive bowel gas pattern. Cholecystectomy.   Original Report Authenticated By: Andreas Newport, M.D.     Medications: Scheduled Meds:   . dipyridamole-aspirin  1 capsule Oral BID  . donepezil  10 mg Oral QHS  . enoxaparin (LOVENOX) injection  40 mg Subcutaneous Q24H  . furosemide  40 mg Intravenous Daily  . levothyroxine  175 mcg Oral QAC breakfast  . loratadine  10 mg Oral Daily  . multivitamin with minerals  1 tablet Oral Daily  .  pantoprazole  40 mg Oral Q0600  . [COMPLETED] piperacillin-tazobactam (ZOSYN)  IV  3.375 g Intravenous Once  . piperacillin-tazobactam (ZOSYN)  IV  3.375 g Intravenous Q8H  . polycarbophil  625 mg Oral Daily  . potassium chloride SA  20 mEq Oral BID  . [COMPLETED] potassium chloride  40 mEq Oral Once  . predniSONE  30 mg Oral QAC breakfast  . saccharomyces boulardii  250 mg Oral BID  . sertraline  50 mg Oral Daily  . simvastatin  20 mg Oral QHS  . sodium chloride  3 mL Intravenous Q12H  . sodium chloride  3 mL Intravenous Q12H  . vancomycin  1,250 mg Intravenous Q24H  . [COMPLETED] vancomycin  1,000 mg Intravenous Once  . [COMPLETED] sodium chloride   Intravenous STAT  . [DISCONTINUED] enoxaparin  40 mg Subcutaneous Q24H  . [DISCONTINUED] furosemide  40 mg Intravenous Q12H  . [DISCONTINUED] sodium chloride  3 mL Intravenous Q12H   Continuous Infusions:  PRN Meds:.sodium chloride, sodium chloride, acetaminophen, acetaminophen, albuterol, alum & mag hydroxide-simeth, ipratropium, loperamide, [EXPIRED] ondansetron (ZOFRAN) IV, ondansetron (ZOFRAN) IV, ondansetron, sodium chloride, sodium chloride, [DISCONTINUED] acetaminophen, [DISCONTINUED] bisacodyl, [DISCONTINUED] ondansetron (ZOFRAN) IV, [DISCONTINUED] senna-docusate  Assessment/Plan:  1. Acute respiratory distress : resolved  2. Suspected pneumonia: clinically do not suspect this, no cough, congestion, CXR not really suggestive of this. I will DC Vancomycin, continue Zosyn due to UTI  3. UTI: recurrent with dysuria and frequency now, UA suggestive fo this, continue Zosyn, FU urine CX sent from ER   4. Acute on chronic diastolic CHF : clinically improved, change lasix to PO, echo 8/13 reviewed, EF 55-60%, grade 2 diastolic dysfunction  Strict I/O. Daily weights. Continue home regimen of Zocor  #5 hypothyroidism  Check a TSH. Continue home dose Synthroid.   #6 hypokalemia  Will replete. Check a magnesium level.   #7 CKD 3:  creatinine slight bump from recent baseline 1.5, suspect due to diuresis, change lasix to PO, Urine output good, DC renal USG  #8 dementia  Continue home regimen.   #9 hypertension  Stable. Follow. Resume home regimen.   #10 chronic steroid use  We'll place on double home regimen of prednisone.   #11 history of TIA  Continue home regimen of Aggrenox.   #12 prophylaxis  PPI for GI prophylaxis. Lovenox for DVT prophylaxis.   Code Status: Full  Family Communication: Updated patient and daughter at bedside.  Disposition Plan: Admit to telemetry       Time spent coordinating care:   LOS: 1 day   [facility] Pager: [phone] 09/03/2012, 1:16 PM

## 2012-09-04 DIAGNOSIS — I10 Essential (primary) hypertension: Secondary | ICD-10-CM

## 2012-09-04 LAB — CK TOTAL AND CKMB (NOT AT ARMC)
Relative Index: INVALID (ref 0.0–2.5)
Relative Index: INVALID (ref 0.0–2.5)
Relative Index: INVALID (ref 0.0–2.5)
Total CK: 30 U/L (ref 7–177)
Total CK: 29 U/L (ref 7–177)
Total CK: 33 U/L (ref 7–177)

## 2012-09-04 LAB — URINE CULTURE

## 2012-09-04 LAB — BASIC METABOLIC PANEL
BUN: 18 mg/dL (ref 6–23)
GFR calc non Af Amer: 27 mL/min — ABNORMAL LOW (ref >90)
Glucose, Bld: 90 mg/dL (ref 70–99)
Potassium: 3.7 mEq/L (ref 3.5–5.1)

## 2012-09-04 MED ORDER — PREDNISONE 5 MG PO TABS
15.0000 mg | ORAL_TABLET | Freq: Every day | ORAL | Status: DC
  Administered 2012-09-04 – 2012-09-07 (×4): 15 mg via ORAL
  Filled 2012-09-04 (×6): qty 1

## 2012-09-04 MED ORDER — FUROSEMIDE 40 MG PO TABS
40.0000 mg | ORAL_TABLET | Freq: Two times a day (BID) | ORAL | Status: DC
  Administered 2012-09-04 – 2012-09-05 (×2): 40 mg via ORAL
  Filled 2012-09-04 (×4): qty 1

## 2012-09-04 NOTE — Progress Notes (Addendum)
[facility]             Progress Note   Subjective: Feels ok, breathing better as long as she uses her Oxygen, urinary symptoms improving  Objective: Vital signs in last 24 hours: Temp:  [98.6 F (37 C)-99.1 F (37.3 C)] 98.6 F (37 C) (12/10 0528) Pulse Rate:  [72-89] 75  (12/10 0528) Resp:  [18-20] 20  (12/10 0528) BP: (114-132)/(49-62) 114/58 mmHg (12/10 0528) SpO2:  [94 %-97 %] 94 % (12/10 0528) Weight change:  Last BM Date: 09/03/12  Intake/Output from previous day: 12/09 0701 - 12/10 0700 In: 600 [P.O.:600] Out: -  Total I/O In: 363 [P.O.:360; I.V.:3] Out: -    Physical Exam: General: Alert, awake, oriented x2, in no acute distress. HEENT: No bruits, no goiter. Heart: Regular rate and rhythm, without murmurs, rubs, gallops. Lungs: Clear to auscultation bilaterally. Abdomen: Soft, nontender, nondistended, positive bowel sounds. Extremities: trace edema, No clubbing cyanosis with positive pedal pulses. Neuro: Grossly intact, nonfocal.    Lab Results: Basic Metabolic Panel:  Basename 09/04/12 0630 09/03/12 0644 09/02/12 1816  NA 135 134* --  K 3.7 3.7 --  CL 97 94* --  CO2 29 29 --  GLUCOSE 90 84 --  BUN 18 22 --  CREATININE 1.70* 1.78* --  CALCIUM 8.8 9.1 --  MG -- -- 1.9  PHOS -- -- --   Liver Function Tests:  [facility] 09/03/12 0644 09/02/12 1228  AST 18 21  ALT 16 18  ALKPHOS 70 81  BILITOT 0.4 0.3  PROT 6.8 7.9  ALBUMIN 2.9* 3.5   No results found for this basename: LIPASE:2,AMYLASE:2 in the last 72 hours No results found for this basename: AMMONIA:2 in the last 72 hours CBC:  Basename 09/03/12 0644 09/02/12 1816 09/02/12 1228  WBC 8.6 10.8* --  NEUTROABS 5.7 -- 13.7*  HGB 11.6* 11.1* --  HCT 35.7* 33.8* --  MCV 89.7 88.9 --  PLT 265 251 --   Cardiac Enzymes:  Basename 09/04/12 1142 09/04/12 0630 09/04/12 0147 09/03/12 0644 09/03/12 0022 09/02/12 1816  CKTOTAL 29 30 31  -- -- --  CKMB 2.1 2.2 2.0 -- -- --   CKMBINDEX -- -- -- -- -- --  TROPONINI -- -- -- <0.30 <0.30 <0.30   BNP:  Basename 09/03/12 0644 09/02/12 1228  PROBNP 936.0* 1222.0*   D-Dimer: No results found for this basename: DDIMER:2 in the last 72 hours CBG: No results found for this basename: GLUCAP:6 in the last 72 hours Hemoglobin A1C: No results found for this basename: HGBA1C in the last 72 hours Fasting Lipid Panel: No results found for this basename: CHOL,HDL,LDLCALC,TRIG,CHOLHDL,LDLDIRECT in the last 72 hours Thyroid Function Tests:  Basename 09/02/12 1816  TSH 0.067*  T4TOTAL --  FREET4 --  T3FREE --  THYROIDAB --   Anemia Panel: No results found for this basename: VITAMINB12,FOLATE,FERRITIN,TIBC,IRON,RETICCTPCT in the last 72 hours Coagulation:  Basename 09/02/12 1228  LABPROT 12.2  INR 0.91   Urine Drug Screen: Drugs of Abuse  No results found for this basename: labopia,  cocainscrnur,  labbenz,  amphetmu,  thcu,  labbarb    Alcohol Level: No results found for this basename: ETH:2 in the last 72 hours Urinalysis:  Basename 09/02/12 1609  COLORURINE YELLOW  LABSPEC 1.010  PHURINE 8.0  GLUCOSEU NEGATIVE  HGBUR NEGATIVE  BILIRUBINUR NEGATIVE  KETONESUR NEGATIVE  PROTEINUR NEGATIVE  UROBILINOGEN 0.2  NITRITE POSITIVE*  LEUKOCYTESUR MODERATE*    Recent Results (from the past 240 hour(s))  CULTURE,  BLOOD (ROUTINE X 2)     Status: Normal (Preliminary result)   Collection Time   09/02/12  3:25 PM      Component Value Range Status Comment   Specimen Description BLOOD RIGHT HAND   Final    Special Requests BOTTLES DRAWN AEROBIC AND ANAEROBIC 3CC   Final    Culture  Setup Time 09/02/2012 20:57   Final    Culture     Final    Value:        BLOOD CULTURE RECEIVED NO GROWTH TO DATE CULTURE WILL BE HELD FOR 5 DAYS BEFORE ISSUING A FINAL NEGATIVE REPORT   Report Status PENDING   Incomplete   CULTURE, BLOOD (ROUTINE X 2)     Status: Normal (Preliminary result)   Collection Time   09/02/12  3:30 PM       Component Value Range Status Comment   Specimen Description BLOOD LEFT HAND   Final    Special Requests BOTTLES DRAWN AEROBIC ONLY 4CC   Final    Culture  Setup Time 09/02/2012 20:57   Final    Culture     Final    Value:        BLOOD CULTURE RECEIVED NO GROWTH TO DATE CULTURE WILL BE HELD FOR 5 DAYS BEFORE ISSUING A FINAL NEGATIVE REPORT   Report Status PENDING   Incomplete   URINE CULTURE     Status: Normal (Preliminary result)   Collection Time   09/02/12  4:09 PM      Component Value Range Status Comment   Specimen Description URINE, CATHETERIZED   Final    Special Requests NONE   Final    Culture  Setup Time 09/02/2012 19:10   Final    Colony Count >=100,000 COLONIES/ML   Final    Culture ESCHERICHIA COLI   Final    Report Status PENDING   Incomplete     Studies/Results: Dg Abd Portable 1v  09/02/2012  *RADIOLOGY REPORT*  Clinical Data: Abdominal pain.  PORTABLE ABDOMEN - 1 VIEW  Comparison: 01/24/2011.  Findings: Cholecystectomy clips are present in the right upper quadrant.  Nonobstructive bowel gas pattern.  Calcified phleboliths in the anatomic pelvis.  Stool and bowel gas are present within the rectosigmoid.  No plain film evidence of free air on this supine radiograph.  IMPRESSION: No acute abnormality.  Nonobstructive bowel gas pattern. Cholecystectomy.   Original Report Authenticated By: Andreas Newport, M.D.     Medications: Scheduled Meds:    . dipyridamole-aspirin  1 capsule Oral BID  . donepezil  10 mg Oral QHS  . enoxaparin (LOVENOX) injection  30 mg Subcutaneous Q24H  . furosemide  40 mg Oral Daily  . levothyroxine  175 mcg Oral QAC breakfast  . loratadine  10 mg Oral Daily  . multivitamin with minerals  1 tablet Oral Daily  . pantoprazole  40 mg Oral Q0600  . piperacillin-tazobactam (ZOSYN)  IV  3.375 g Intravenous Q8H  . polycarbophil  625 mg Oral Daily  . potassium chloride SA  20 mEq Oral BID  . predniSONE  15 mg Oral Q breakfast  . saccharomyces  boulardii  250 mg Oral BID  . sertraline  50 mg Oral Daily  . simvastatin  20 mg Oral QHS  . sodium chloride  3 mL Intravenous Q12H  . sodium chloride  3 mL Intravenous Q12H  . [DISCONTINUED] enoxaparin (LOVENOX) injection  40 mg Subcutaneous Q24H  . [DISCONTINUED] predniSONE  30 mg Oral QAC breakfast  . [  DISCONTINUED] vancomycin  1,250 mg Intravenous Q24H   Continuous Infusions:  PRN Meds:.sodium chloride, sodium chloride, acetaminophen, acetaminophen, albuterol, alum & mag hydroxide-simeth, ipratropium, loperamide, ondansetron (ZOFRAN) IV, ondansetron, sodium chloride, sodium chloride  Assessment/Plan:  1. Acute respiratory distress : resolved  2. Suspected pneumonia: clinically do not suspect this, no cough, congestion, CXR not really suggestive of this. I stopped Vancomycin, continue Zosyn due to UTI  3. Ecoli UTI: Recurrent with dysuria and frequency now, UA suggestive fo this, continue Zosyn, FU urine CX sensitivities   4. Acute on chronic diastolic CHF : clinically improved, change lasix to PO, echo 8/13 reviewed, EF 55-60%, grade 2 diastolic dysfunction  I/O inaccurate due to incontinence, follow weights   #5 hypothyroidism  Check a TSH. Continue home dose Synthroid.   #6 history of TIA  Continue home regimen of Aggrenox.   #7 CKD 3: creatinine slight bump from recent baseline 1.5, suspect due to diuresis, change lasix to PO, Urine output good, DC renal USG  #8 dementia  Continue home regimen.   #9 hypertension  Stable. Follow. Resume home regimen.   #10 h/o Temporal arteritis: back on home dose of Prednisone  #11 prophylaxis  PPI for GI prophylaxis. Lovenox for DVT prophylaxis.   Code Status: Full  Family Communication: Updated patient and daughter at bedside yesterday.  Disposition Plan: inpatient       Time spent coordinating care:   LOS: 2 days   [facility] Pager: [phone] 09/04/2012, 1:30 PM

## 2012-09-04 NOTE — Care Management Note (Signed)
    Page 1 of 1   09/04/2012     11:26:47 AM   CARE MANAGEMENT NOTE 09/04/2012  Patient:  [name], [name]   Account Number:  [id number]  Date Initiated:  09/04/2012  Documentation initiated by:  Tera Mater  Subjective/Objective Assessment:   78yo female admitted with PNA and UTI. Pt. currently resides at [facility].     Action/Plan:   PT is Recommending CIR, however CIR does not believe they could be of assistance and rec SNF instead. CSW has been consulted.   Anticipated DC Date:  09/06/2012   Anticipated DC Plan:  SKILLED NURSING FACILITY  In-house referral  Clinical Social Worker      DC Planning Services  CM consult      Choice offered to / List presented to:             [facility] agency  [facility]   Status of service:  In process, will continue to follow Medicare Important Message given?   (If response is "NO", the following Medicare IM given date fields will be blank) Date Medicare IM given:   Date Additional Medicare IM given:    Discharge Disposition:    Per UR Regulation:  Reviewed for med. necessity/level of care/duration of stay  If discussed at Long Length of Stay Meetings, dates discussed:    Comments:  09/04/12 1125 Noted that CIR has recommending SNF. CSW has been consulted. Tera Mater, RN, BSN [place] [phone]   09/03/12 1630 Spoke with Corrie Dandy, with [facility], and was told that pt. is currently being seen for [facility] services.  Made Mary aware that PT is recommending further rehab prior to returning to her home.  NCM to follow Tera Mater, RN, BSN NCM [phone]

## 2012-09-05 LAB — BASIC METABOLIC PANEL
BUN: 24 mg/dL — ABNORMAL HIGH (ref 6–23)
Chloride: 102 mEq/L (ref 96–112)
GFR calc Af Amer: 32 mL/min — ABNORMAL LOW (ref >90)
Potassium: 3.6 mEq/L (ref 3.5–5.1)
Sodium: 140 mEq/L (ref 135–145)

## 2012-09-05 MED ORDER — FUROSEMIDE 20 MG PO TABS
20.0000 mg | ORAL_TABLET | Freq: Two times a day (BID) | ORAL | Status: DC
  Administered 2012-09-05 – 2012-09-06 (×2): 20 mg via ORAL
  Filled 2012-09-05 (×4): qty 1

## 2012-09-05 NOTE — Progress Notes (Signed)
UR Completed.  [name] Jane [phone] 09/05/2012  

## 2012-09-05 NOTE — Progress Notes (Signed)
[facility]             Progress Note   Subjective: Feels ok, breathing better States she has pain at onset or urination, no fever no chills no abd pain She doesn't recall much of her medical h/o  Objective: Vital signs in last 24 hours: Temp:  [97.1 F (36.2 C)-97.6 F (36.4 C)] 97.2 F (36.2 C) (12/11 1357) Pulse Rate:  [70-80] 80  (12/11 1357) Resp:  [18-19] 19  (12/11 1357) BP: (121-147)/(66-74) 122/66 mmHg (12/11 1357) SpO2:  [98 %-99 %] 99 % (12/11 1357) Weight:  [183 lb 13.8 oz (83.4 kg)] 183 lb 13.8 oz (83.4 kg) (12/11 0431) Weight change:  Last BM Date: 09/03/12  Intake/Output from previous day: 12/10 0701 - 12/11 0700 In: 1246 [P.O.:840; I.V.:6; IV Piggyback:400] Out: -  Total I/O In: 600 [P.O.:600] Out: -    Physical Exam: General: Alert, awake, oriented x2, in no acute distress. Morbid obesity, Body mass index is 27.15 kg/(m^2). HEENT: No bruits, no goiter. Heart: Regular rate and rhythm, without murmurs, rubs, gallops. Lungs: Clear to auscultation bilaterally. Abdomen: Soft, nontender, nondistended, positive bowel sounds. Extremities: trace edema, No clubbing cyanosis with positive pedal pulses. Neuro: Grossly intact, nonfocal.    Lab Results: Basic Metabolic Panel:  Basename 09/05/12 0505 09/04/12 0630  NA 140 135  K 3.6 3.7  CL 102 97  CO2 28 29  GLUCOSE 82 90  BUN 24* 18  CREATININE 1.69* 1.70*  CALCIUM 8.9 8.8  MG -- --  PHOS -- --   Liver Function Tests:  Basename 09/03/12 0644  AST 18  ALT 16  ALKPHOS 70  BILITOT 0.4  PROT 6.8  ALBUMIN 2.9*   No results found for this basename: LIPASE:2,AMYLASE:2 in the last 72 hours No results found for this basename: AMMONIA:2 in the last 72 hours CBC:  Basename 09/03/12 0644  WBC 8.6  NEUTROABS 5.7  HGB 11.6*  HCT 35.7*  MCV 89.7  PLT 265   Cardiac Enzymes:  Basename 09/04/12 1730 09/04/12 1142 09/04/12 0630 09/03/12 0644 09/03/12 0022  CKTOTAL 33 29 30 -- --   CKMB 2.2 2.1 2.2 -- --  CKMBINDEX -- -- -- -- --  TROPONINI -- -- -- <0.30 <0.30   BNP:  Basename 09/03/12 0644  PROBNP 936.0*   D-Dimer: No results found for this basename: DDIMER:2 in the last 72 hours CBG: No results found for this basename: GLUCAP:6 in the last 72 hours Hemoglobin A1C: No results found for this basename: HGBA1C in the last 72 hours Fasting Lipid Panel: No results found for this basename: CHOL,HDL,LDLCALC,TRIG,CHOLHDL,LDLDIRECT in the last 72 hours Thyroid Function Tests: No results found for this basename: TSH,T4TOTAL,FREET4,T3FREE,THYROIDAB in the last 72 hours Anemia Panel: No results found for this basename: VITAMINB12,FOLATE,FERRITIN,TIBC,IRON,RETICCTPCT in the last 72 hours Coagulation: No results found for this basename: LABPROT:2,INR:2 in the last 72 hours Urine Drug Screen: Drugs of Abuse  No results found for this basename: labopia,  cocainscrnur,  labbenz,  amphetmu,  thcu,  labbarb    Alcohol Level: No results found for this basename: ETH:2 in the last 72 hours Urinalysis: No results found for this basename: COLORURINE:2,APPERANCEUR:2,LABSPEC:2,PHURINE:2,GLUCOSEU:2,HGBUR:2,BILIRUBINUR:2,KETONESUR:2,PROTEINUR:2,UROBILINOGEN:2,NITRITE:2,LEUKOCYTESUR:2 in the last 72 hours  Recent Results (from the past 240 hour(s))  CULTURE, BLOOD (ROUTINE X 2)     Status: Normal (Preliminary result)   Collection Time   09/02/12  3:25 PM      Component Value Range Status Comment   Specimen Description BLOOD RIGHT HAND   Final  Special Requests BOTTLES DRAWN AEROBIC AND ANAEROBIC 3CC   Final    Culture  Setup Time 09/02/2012 20:57   Final    Culture     Final    Value:        BLOOD CULTURE RECEIVED NO GROWTH TO DATE CULTURE WILL BE HELD FOR 5 DAYS BEFORE ISSUING A FINAL NEGATIVE REPORT   Report Status PENDING   Incomplete   CULTURE, BLOOD (ROUTINE X 2)     Status: Normal (Preliminary result)   Collection Time   09/02/12  3:30 PM      Component Value Range  Status Comment   Specimen Description BLOOD LEFT HAND   Final    Special Requests BOTTLES DRAWN AEROBIC ONLY 4CC   Final    Culture  Setup Time 09/02/2012 20:57   Final    Culture     Final    Value:        BLOOD CULTURE RECEIVED NO GROWTH TO DATE CULTURE WILL BE HELD FOR 5 DAYS BEFORE ISSUING A FINAL NEGATIVE REPORT   Report Status PENDING   Incomplete   URINE CULTURE     Status: Normal   Collection Time   09/02/12  4:09 PM      Component Value Range Status Comment   Specimen Description URINE, CATHETERIZED   Final    Special Requests NONE   Final    Culture  Setup Time 09/02/2012 19:10   Final    Colony Count >=100,000 COLONIES/ML   Final    Culture ESCHERICHIA COLI   Final    Report Status 09/04/2012 FINAL   Final    Organism ID, Bacteria ESCHERICHIA COLI   Final     Studies/Results: No results found.  Medications: Scheduled Meds:    . dipyridamole-aspirin  1 capsule Oral BID  . donepezil  10 mg Oral QHS  . enoxaparin (LOVENOX) injection  30 mg Subcutaneous Q24H  . furosemide  20 mg Oral BID  . levothyroxine  175 mcg Oral QAC breakfast  . loratadine  10 mg Oral Daily  . multivitamin with minerals  1 tablet Oral Daily  . pantoprazole  40 mg Oral Q0600  . piperacillin-tazobactam (ZOSYN)  IV  3.375 g Intravenous Q8H  . polycarbophil  625 mg Oral Daily  . potassium chloride SA  20 mEq Oral BID  . predniSONE  15 mg Oral Q breakfast  . saccharomyces boulardii  250 mg Oral BID  . sertraline  50 mg Oral Daily  . simvastatin  20 mg Oral QHS  . sodium chloride  3 mL Intravenous Q12H  . sodium chloride  3 mL Intravenous Q12H  . [DISCONTINUED] furosemide  40 mg Oral BID   Continuous Infusions:  PRN Meds:.sodium chloride, sodium chloride, acetaminophen, acetaminophen, albuterol, alum & mag hydroxide-simeth, ipratropium, loperamide, ondansetron (ZOFRAN) IV, ondansetron, sodium chloride, sodium chloride  Assessment/Plan:  1. Acute Multifactorial respiratory distress-probably  CHF>PNA : resolved   2. Suspected pneumonia: clinically do not suspect this, no cough, congestion, CXR not really suggestive of this. I stopped Vancomycin, continue Zosyn due to UTI  3. Ecoli UTI: Recurrent with dysuria and frequency now, UA suggestive fo this, continue Zosyn-UC=no good oral equivalent-Keep IV Zosyn x 1 days and reassess in am with ID guidance best alternative-?Fosphamycin?   4. Acute on chronic diastolic CHF : clinically improved, change lasix to PO, echo 8/13 reviewed, EF 55-60%, grade 2 diastolic dysfunction  I/O inaccurate due to incontinence, follow weights   #5 hypothyroidism  Check  a TSH. Continue home dose Synthroid.   #6 history of TIA  Continue home regimen of Aggrenox.   #7 CKD 3: creatinine slight bump from recent baseline 1.5, suspect due to diuresis, change lasix to PO, Urine output good, DC renal USG  #8 dementia  Continue home regimen.   #9 hypertension  Stable. Follow. Resume home regimen.   #10 h/o Temporal arteritis: back on home dose of Prednisone  #11 prophylaxis  PPI for GI prophylaxis. Lovenox for DVT prophylaxis.   Code Status: Full  Family Communication: none at bedside, will update daughter tomorrow Disposition Plan: inpatient       Time spent coordinating care:   LOS: 3 days   Rhetta Mura [facility] Pager: [phone] 09/05/2012, 6:43 PM

## 2012-09-05 NOTE — Plan of Care (Signed)
Problem: Discharge Progression Outcomes Goal: Home O2 if indicated Outcome: Completed/Met Date Met:  09/05/12 Pt states she has 02 at home that she wears PRN

## 2012-09-05 NOTE — Progress Notes (Signed)
Physical Therapy Treatment Patient Details Name: [name] MRN: [id number] DOB: 08-04-1930 Today's Date: 09/05/2012 Time: 1410-1443 PT Time Calculation (min): 33 min  PT Assessment / Plan / Recommendation Comments on Treatment Session  Improved balance today but still weak. Will need SNF rehab prior to d/c back to ALF.     Follow Up Recommendations  SNF     Does the patient have the potential to tolerate intense rehabilitation     Barriers to Discharge        Equipment Recommendations  None recommended by PT    Recommendations for Other Services    Frequency     Plan Frequency remains appropriate;Discharge plan needs to be updated    Precautions / Restrictions Precautions Precautions: Fall Restrictions Weight Bearing Restrictions: No       Mobility  Bed Mobility Bed Mobility: Not assessed Transfers Transfers: Sit to Stand;Stand to Sit (3 trials) Sit to Stand: 3: Mod assist;With upper extremity assist;From chair/3-in-1 Stand to Sit: 3: Mod assist;With upper extremity assist;To chair/3-in-1 Details for Transfer Assistance: facilitation to bring trunk anteriorly over BOS as well as upward facilitation for lift off and power through for full standing; cues for tall posture Ambulation/Gait Ambulation/Gait Assistance: 4: Min assist Ambulation Distance (Feet): 10 Feet Assistive device: Rolling walker Ambulation/Gait Assistance Details: cues for tall posture and negotiation of and postioning within the RW Gait Pattern: Step-through pattern;Trunk flexed    Exercises General Exercises - Lower Extremity Long Arc Quad: AROM;Both;10 reps;Seated Hip ABduction/ADduction: AROM;Both;10 reps;Seated Hip Flexion/Marching: AROM;Both;10 reps;Seated Toe Raises: AROM;Both;10 reps;Seated Heel Raises: AROM;Both;10 reps;Seated   PT Goals Acute Rehab PT Goals PT Goal: Sit to Stand - Progress: Progressing toward goal PT Goal: Stand to Sit - Progress: Progressing toward goal PT  Transfer Goal: Bed to Chair/Chair to Bed - Progress: Progressing toward goal PT Goal: Stand - Progress: Progressing toward goal PT Goal: Ambulate - Progress: Progressing toward goal PT Goal: Perform Home Exercise Program - Progress: Progressing toward goal  Visit Information  Last PT Received On: 09/05/12 Assistance Needed: +2 (for chair follow)    Subjective Data  Subjective: I like you, and her too.    Cognition  Overall Cognitive Status: History of cognitive impairments - at baseline Area of Impairment: Memory;Problem solving;Awareness of deficits;Attention Arousal/Alertness: Awake/alert Orientation Level: Appears intact for tasks assessed Behavior During Session: [facility] for tasks performed Current Attention Level: Selective    Balance  Static Standing Balance Static Standing - Balance Support: Bilateral upper extremity supported Static Standing - Level of Assistance: 4: Min assist Static Standing - Comment/# of Minutes: pt stood x3 for at least 2 minutes each stand, as she fatigues her trunk comes foward and she begins to flex elbows and trunk   End of Session PT - End of Session Equipment Utilized During Treatment: Gait belt Activity Tolerance: Patient tolerated treatment well Patient left: in chair Nurse Communication: Mobility status   GP     [facility] HELEN 09/05/2012, 3:27 PM

## 2012-09-06 LAB — RENAL FUNCTION PANEL
CO2: 26 mEq/L (ref 19–32)
Chloride: 100 mEq/L (ref 96–112)
GFR calc Af Amer: 31 mL/min — ABNORMAL LOW (ref >90)
GFR calc non Af Amer: 27 mL/min — ABNORMAL LOW (ref >90)
Potassium: 3.7 mEq/L (ref 3.5–5.1)
Sodium: 140 mEq/L (ref 135–145)

## 2012-09-06 MED ORDER — FOSFOMYCIN TROMETHAMINE 3 G PO PACK
3.0000 g | PACK | Freq: Once | ORAL | Status: AC
  Administered 2012-09-06: 3 g via ORAL
  Filled 2012-09-06: qty 3

## 2012-09-06 MED ORDER — FOSFOMYCIN TROMETHAMINE 3 G PO PACK: 3.0000 g | PACK | Freq: Once | ORAL | Status: DC

## 2012-09-06 MED ORDER — FUROSEMIDE 80 MG PO TABS
80.0000 mg | ORAL_TABLET | Freq: Two times a day (BID) | ORAL | Status: DC
  Administered 2012-09-06 – 2012-09-07 (×3): 80 mg via ORAL
  Filled 2012-09-06 (×4): qty 1

## 2012-09-06 NOTE — Progress Notes (Signed)
Occupational Therapy Treatment Patient Details Name: [name] MRN: [id number] DOB: Jun 19, 1930 Today's Date: 09/06/2012 Time: 1478-2956 OT Time Calculation (min): 24 min  OT Assessment / Plan / Recommendation Comments on Treatment Session Pt is progressing towards goals but continues to require significant assist for functional mobility and balance.  Continue to recommend SNF unless [facility] (where pt was before) is able to provide heavy assist for mobility which daughter reports they can.  Will need to verify this with [facility].    Follow Up Recommendations  SNF    Barriers to Discharge       Equipment Recommendations  None recommended by OT    Recommendations for Other Services    Frequency Min 2X/week   Plan Discharge plan remains appropriate    Precautions / Restrictions Precautions Precautions: Fall   Pertinent Vitals/Pain See vitals    ADL  Upper Body Dressing: Performed;Minimal assistance Where Assessed - Upper Body Dressing: Unsupported sitting Toilet Transfer: Performed;Moderate assistance Toilet Transfer Method: Stand pivot Toilet Transfer Equipment: Bedside commode Toileting - Clothing Manipulation and Hygiene: Performed;+1 Total assistance Where Assessed - Toileting Clothing Manipulation and Hygiene: Sit to stand from 3-in-1 or toilet Transfers/Ambulation Related to ADLs: Mod assist for stand pivot transfer.  Pt with anterior/posterior sway. ADL Comments: Daughter present during session and reports that pt is from "[facility]" which is technically an ALF and that pt is hoping to return to ALF.  Discussed with pt's daughter pt's need for heavy assist (varying between +1 - +2 assist at this time during transfers).  Daughter reports that [facility] ALF is able to provide pt with this level of heavy physical assist.     OT Diagnosis:    OT Problem List:   OT Treatment Interventions:     OT Goals ADL Goals Pt Will Perform Upper Body  Dressing: with set-up;with supervision;Unsupported ADL Goal: Upper Body Dressing - Progress: Progressing toward goals Pt Will Transfer to Toilet: with min assist;Ambulation;with DME;3-in-1 ADL Goal: Toilet Transfer - Progress: Progressing toward goals Pt Will Perform Toileting - Clothing Manipulation: with supervision;Sitting on 3-in-1 or toilet;Standing;with cueing (comment type and amount) ADL Goal: Toileting - Clothing Manipulation - Progress: Progressing toward goals Pt Will Perform Toileting - Hygiene: with supervision;Leaning right and/or left on 3-in-1/toilet;Sit to stand from 3-in-1/toilet ADL Goal: Toileting - Hygiene - Progress: Progressing toward goals  Visit Information  Last OT Received On: 09/06/12 Assistance Needed: +2 (for safety with ambulation/ chair follow)    Subjective Data      Prior Functioning       Cognition  Overall Cognitive Status: History of cognitive impairments - at baseline Area of Impairment: Memory;Problem solving;Awareness of deficits;Attention Arousal/Alertness: Awake/alert Orientation Level: Appears intact for tasks assessed Behavior During Session: [facility] for tasks performed    Mobility  Shoulder Instructions Bed Mobility Bed Mobility: Not assessed Transfers Transfers: Sit to Stand;Stand to Sit Sit to Stand: 3: Mod assist;From chair/3-in-1;With upper extremity assist;With armrests Stand to Sit: 3: Mod assist;To chair/3-in-1;With armrests;With upper extremity assist Details for Transfer Assistance: assist for power up from chair/3n1 and for balance as pt tends to lean anteriorly/posteriorly       Exercises      Balance     End of Session OT - End of Session Equipment Utilized During Treatment: Gait belt Activity Tolerance: Patient tolerated treatment well Patient left: in chair;with call bell/phone within reach;with family/visitor present Nurse Communication: Mobility status  GO    09/06/2012 Cipriano Mile OTR/L Pager  [phone] Office [phone]  Cipriano Mile 09/06/2012, 3:17 PM

## 2012-09-06 NOTE — Progress Notes (Signed)
[facility]             Progress Note   Subjective: Feels ok, breathing better States she has pain at onset or urination, no fever no chills no abd pain She doesn't recall much of her medical h/o Can orient to date, year, day, city, [place], time of year  Objective: Vital signs in last 24 hours: Temp:  [97.2 F (36.2 C)-98.2 F (36.8 C)] 97.2 F (36.2 C) (12/12 0832) Pulse Rate:  [61-80] 67  (12/12 0832) Resp:  [19-20] 20  (12/12 0832) BP: (122-128)/(46-66) 128/58 mmHg (12/12 0832) SpO2:  [94 %-100 %] 97 % (12/12 0832) Weight:  [193 lb 8 oz (87.771 kg)] 193 lb 8 oz (87.771 kg) (12/12 0449) Weight change: 9 lb 10.2 oz (4.371 kg) Last BM Date: 09/05/12  Intake/Output from previous day: 12/11 0701 - 12/12 0700 In: 600 [P.O.:600] Out: 150 [Urine:150] Total I/O In: 363 [P.O.:360; I.V.:3] Out: -    Physical Exam: General: Alert, awake, oriented x2, in no acute distress. Morbid obesity, Body mass index is 28.57 kg/(m^2). HEENT: No bruits, no goiter. Heart: Regular rate and rhythm, without murmurs, rubs, gallops. Lungs: Clear to auscultation bilaterally. Abdomen: Soft, nontender, nondistended, positive bowel sounds. Extremities: +1 edema, No clubbing cyanosis with positive pedal pulses. Neuro: Grossly intact, nonfocal.    Lab Results: Basic Metabolic Panel:  Basename 09/06/12 0425 09/05/12 0505  NA 140 140  K 3.7 3.6  CL 100 102  CO2 26 28  GLUCOSE 74 82  BUN 28* 24*  CREATININE 1.71* 1.69*  CALCIUM 9.0 8.9  MG -- --  PHOS 3.4 --   Liver Function Tests:  Basename 09/06/12 0425  AST --  ALT --  ALKPHOS --  BILITOT --  PROT --  ALBUMIN 2.7*   No results found for this basename: LIPASE:2,AMYLASE:2 in the last 72 hours No results found for this basename: AMMONIA:2 in the last 72 hours CBC: No results found for this basename: WBC:2,NEUTROABS:2,HGB:2,HCT:2,MCV:2,PLT:2 in the last 72 hours Cardiac Enzymes:  Basename 09/04/12 1730 09/04/12 1142  09/04/12 0630  CKTOTAL 33 29 30  CKMB 2.2 2.1 2.2  CKMBINDEX -- -- --  TROPONINI -- -- --   BNP: No results found for this basename: PROBNP:3 in the last 72 hours D-Dimer: No results found for this basename: DDIMER:2 in the last 72 hours CBG: No results found for this basename: GLUCAP:6 in the last 72 hours Hemoglobin A1C: No results found for this basename: HGBA1C in the last 72 hours Fasting Lipid Panel: No results found for this basename: CHOL,HDL,LDLCALC,TRIG,CHOLHDL,LDLDIRECT in the last 72 hours Thyroid Function Tests: No results found for this basename: TSH,T4TOTAL,FREET4,T3FREE,THYROIDAB in the last 72 hours Anemia Panel: No results found for this basename: VITAMINB12,FOLATE,FERRITIN,TIBC,IRON,RETICCTPCT in the last 72 hours Coagulation: No results found for this basename: LABPROT:2,INR:2 in the last 72 hours Urine Drug Screen: Drugs of Abuse  No results found for this basename: labopia,  cocainscrnur,  labbenz,  amphetmu,  thcu,  labbarb    Alcohol Level: No results found for this basename: ETH:2 in the last 72 hours Urinalysis: No results found for this basename: COLORURINE:2,APPERANCEUR:2,LABSPEC:2,PHURINE:2,GLUCOSEU:2,HGBUR:2,BILIRUBINUR:2,KETONESUR:2,PROTEINUR:2,UROBILINOGEN:2,NITRITE:2,LEUKOCYTESUR:2 in the last 72 hours  Recent Results (from the past 240 hour(s))  CULTURE, BLOOD (ROUTINE X 2)     Status: Normal (Preliminary result)   Collection Time   09/02/12  3:25 PM      Component Value Range Status Comment   Specimen Description BLOOD RIGHT HAND   Final    Special Requests BOTTLES DRAWN  AEROBIC AND ANAEROBIC 3CC   Final    Culture  Setup Time 09/02/2012 20:57   Final    Culture     Final    Value:        BLOOD CULTURE RECEIVED NO GROWTH TO DATE CULTURE WILL BE HELD FOR 5 DAYS BEFORE ISSUING A FINAL NEGATIVE REPORT   Report Status PENDING   Incomplete   CULTURE, BLOOD (ROUTINE X 2)     Status: Normal (Preliminary result)   Collection Time   09/02/12  3:30  PM      Component Value Range Status Comment   Specimen Description BLOOD LEFT HAND   Final    Special Requests BOTTLES DRAWN AEROBIC ONLY 4CC   Final    Culture  Setup Time 09/02/2012 20:57   Final    Culture     Final    Value:        BLOOD CULTURE RECEIVED NO GROWTH TO DATE CULTURE WILL BE HELD FOR 5 DAYS BEFORE ISSUING A FINAL NEGATIVE REPORT   Report Status PENDING   Incomplete   URINE CULTURE     Status: Normal   Collection Time   09/02/12  4:09 PM      Component Value Range Status Comment   Specimen Description URINE, CATHETERIZED   Final    Special Requests NONE   Final    Culture  Setup Time 09/02/2012 19:10   Final    Colony Count >=100,000 COLONIES/ML   Final    Culture ESCHERICHIA COLI   Final    Report Status 09/04/2012 FINAL   Final    Organism ID, Bacteria ESCHERICHIA COLI   Final     Studies/Results: No results found.  Medications: Scheduled Meds:    . dipyridamole-aspirin  1 capsule Oral BID  . donepezil  10 mg Oral QHS  . enoxaparin (LOVENOX) injection  30 mg Subcutaneous Q24H  . fosfomycin  3 g Oral Once  . furosemide  20 mg Oral BID  . levothyroxine  175 mcg Oral QAC breakfast  . loratadine  10 mg Oral Daily  . multivitamin with minerals  1 tablet Oral Daily  . pantoprazole  40 mg Oral Q0600  . polycarbophil  625 mg Oral Daily  . potassium chloride SA  20 mEq Oral BID  . predniSONE  15 mg Oral Q breakfast  . saccharomyces boulardii  250 mg Oral BID  . sertraline  50 mg Oral Daily  . simvastatin  20 mg Oral QHS  . sodium chloride  3 mL Intravenous Q12H  . sodium chloride  3 mL Intravenous Q12H  . [DISCONTINUED] fosfomycin  3 g Oral Once  . [DISCONTINUED] piperacillin-tazobactam (ZOSYN)  IV  3.375 g Intravenous Q8H   Continuous Infusions:  PRN Meds:.sodium chloride, sodium chloride, acetaminophen, acetaminophen, albuterol, alum & mag hydroxide-simeth, ipratropium, loperamide, ondansetron (ZOFRAN) IV, ondansetron, sodium chloride, sodium  chloride  Assessment/Plan:  1. Acute Multifactorial respiratory distress-probably CHF>PNA : resolved.  O2 sats were in the 97%ile  2. Suspected pneumonia: clinically do not suspect this, no cough, congestion, CXR not really suggestive of this. I stopped Vancomycin, continue Zosyn due to UTI  3. Ecoli UTI-pan-resistant: Recurrent with dysuria and frequency now, UA suggestive fo this, continue Zosyn-UC=no good oral equivalent-Fosphamycin 3 gm x one dose given today 09/06/12-will review clinically and if better potentially can be discharged to skilled nursing facility tomorrow   4. Acute on chronic diastolic CHF : clinically improved, change lasix to PO, echo 8/13 reviewed, EF  55-60%, grade 2 diastolic dysfunction  I/O inaccurate due to incontinence, follow weights  Filed Weights   09/03/12 0550 09/05/12 0431 09/06/12 0449  Weight: 184 lb 3.2 oz (83.553 kg) 183 lb 13.8 oz (83.4 kg) 193 lb 8 oz (87.771 kg)   Will increase by mouth Lasix to 80 mg twice a day x one day and reassess weight   #5 hypothyroidism  Check a TSH. Continue home dose Synthroid 175 mcg daily  #6 history of TIA  Continue home regimen of Aggrenox 200-25 every 12 hourly.  #7 CKD 3: creatinine slight bump from recent baseline 1.5, suspect due to diuresis, change lasix to PO, Urine output good, DC renal USG  #8 dementia  Continue home regimen-I. suspect she has mild dementia, but she is very clear and can orient to time place and person  #9 hypertension  Stable. Follow. Resume home regimen.   #10 h/o Temporal arteritis: back on home dose of Prednisone 50 mg daily  #11 prophylaxis  PPI for GI prophylaxis. Lovenox for DVT prophylaxis.   Code Status: Full  Family Communication: Discussed with [name], daughter at [phone] Disposition Plan: inpatient   Time spent coordinating care:   LOS: 4 days   Rhetta Mura [facility] Pager: [phone] 09/06/2012, 11:48 AM

## 2012-09-06 NOTE — Progress Notes (Signed)
Met with patient and her daughter [name] today [phone].  Patient is a resident of the [facility] and Physical Therapy had originally recommended increase in level of care to SNF. Patient has shown progression and both patient and daughter feel that she can manage well back at the [facility].  Daughter discussed with PT who now agrees with return to ALF. Fl2 placed on chart for MD's signature.  Attempting to reach staff at [facility]-- Message left for Alexis Frock at [facility] and will check back in the am. Also awaiting MD's decision regarding possible d/c.   Lorri Frederick. [place]  [phone]

## 2012-09-07 LAB — RENAL FUNCTION PANEL
CO2: 29 mEq/L (ref 19–32)
GFR calc Af Amer: 47 mL/min — ABNORMAL LOW (ref >90)
GFR calc non Af Amer: 41 mL/min — ABNORMAL LOW (ref >90)
Glucose, Bld: 77 mg/dL (ref 70–99)
Potassium: 3.5 mEq/L (ref 3.5–5.1)
Sodium: 141 mEq/L (ref 135–145)

## 2012-09-07 LAB — CBC
Hemoglobin: 10.9 g/dL — ABNORMAL LOW (ref 12.0–15.0)
Platelets: 263 10*3/uL (ref 150–400)
RBC: 3.72 MIL/uL — ABNORMAL LOW (ref 3.87–5.11)
WBC: 6.8 10*3/uL (ref 4.0–10.5)

## 2012-09-07 MED ORDER — ALBUTEROL SULFATE (5 MG/ML) 0.5% IN NEBU: 2.5000 mg | INHALATION_SOLUTION | RESPIRATORY_TRACT | Status: DC | PRN

## 2012-09-07 MED ORDER — IPRATROPIUM BROMIDE 0.02 % IN SOLN: 0.5000 mg | RESPIRATORY_TRACT | Status: DC | PRN

## 2012-09-07 MED ORDER — FUROSEMIDE 80 MG PO TABS: 80.0000 mg | ORAL_TABLET | Freq: Two times a day (BID) | ORAL | Status: DC

## 2012-09-07 MED ORDER — LOPERAMIDE HCL 2 MG PO CAPS: 2.0000 mg | ORAL_CAPSULE | ORAL | Status: DC | PRN

## 2012-09-07 NOTE — Clinical Social Work Psychosocial (Addendum)
    Clinical Social Work Department BRIEF PSYCHOSOCIAL ASSESSMENT 09/07/2012  Patient:  [name], [name]     Account Number:  [id number]     Admit date:  09/02/2012  Clinical Social Worker:  Tiburcio Pea  Date/Time:  09/04/2012 01:00 PM  Referred by:  Physician  Date Referred:  09/03/2012 Referred for  SNF Placement   Other Referral:   Interview type:  Other - See comment Other interview type:   Patient- has some dementia and daughterJaquita Harrison  [phone]    PSYCHOSOCIAL DATA Living Status:  FACILITY Admitted from facility:   Level of care:  Assisted Living Primary support name:  [name] Primary support relationship to patient:  CHILD, ADULT Degree of support available:   Strong support  Patient is a resident of [facility]    CURRENT CONCERNS Current Concerns  Post-Acute Placement   Other Concerns:   PT is recommending short term rehab    SOCIAL WORK ASSESSMENT / PLAN Patient is a resident of [address]- very pleasant lady who states that she has been very satisfied with the care at the Assisted Living.  Physical Therapy is recommending short term SNF and this was discussed with patient and daughter.  Will initiate SNF search and FL2 placed on shadow chart for MD's signature.   Assessment/plan status:  Psychosocial Support/Ongoing Assessment of Needs Other assessment/ plan:   Information/referral to community resources:   SNF list provided to patient. Discussed aftercare needs for possible return to ALF after rehab.    PATIENT'S/FAMILY'S RESPONSE TO PLAN OF CARE: Patient is alert and aware- very pleasant. She is agreeable to bed search but really hopes to return to [facility]. Patient's daughter hopes for the same. CSW will discuss with MD and PT and follow up with patient and daughter.

## 2012-09-07 NOTE — Discharge Summary (Signed)
Physician Discharge Summary  [name] JYN:[id number] DOB: [birth year] DOA: 09/02/2012  PCP: Cain Saupe, MD  Admit date: 09/02/2012 Discharge date: 09/07/2012  Time spent: 45 minutes  Recommendations for Outpatient Follow-up:  1. Recommend potential urology input for UTI prophylaxis. 2. Recommend home health PT OT at assisted living facility 3. Recommend loperamide 2 mg when necessary for diarrhea  4. Recommended daily weights and basic metabolic/renal panel in 3 days 5. Recommend consideration of ACE inhibitor or beta blocker if her blood pressure will allow the same as an outpatient  Discharge Diagnoses:  Principal Problem:  *Acute respiratory distress Active Problems:  Hypothyroidism  Hyperlipidemia  Depression  Dementia  Chronic anemia  Diastolic CHF  Hypertension  Acute exacerbation of CHF (congestive heart failure)  Acute on chronic diastolic heart failure  HCAP (healthcare-associated pneumonia)  Hypokalemia  Leukocytosis  ARF (acute renal failure)  UTI (urinary tract infection)   Discharge Condition: Stable  Diet recommendation: Heart healthy  Filed Weights   09/05/12 0431 09/06/12 0449 09/07/12 0456  Weight: 183 lb 13.8 oz (83.4 kg) 193 lb 8 oz (87.771 kg) 188 lb 11.4 oz (85.6 kg)    History of present illness:  This pleasant 78 year old Caucasian female was admitted 09/07/2012 with concerns for pneumonia. It was noted that she had a white count of 18,000. On followup chest x-rays during hospitalization it was found that her chest x-ray is actually clear at the source of sepsis of note he was determined to be recurring UTIs She did well from a clinical standpoint as per below  Hospital Course:  1. Acute Multifactorial respiratory distress-probably CHF>PNA : resolved. O2 sats were in the 97%ile.  2. Suspected pneumonia: clinically do not suspect this, no cough, congestion, CXR not really suggestive of this. I stopped Vancomycin, continued Zosyn due to  UTI as per dictation below.  3. Ecoli UTI-pan-resistant: Recurrent with dysuria and frequency now, UA suggestive fo this, continue Zosyn-UC=no good oral equivalent-Fosphamycin 3 gm x one dose given today 09/06/12-patient was afebrile and doing well subsequent to this and tolerated by mouth without issue.  4. Acute on chronic diastolic CHF : clinically improved, change lasix to PO, echo 8/13 reviewed, EF 55-60%, grade 2 diastolic dysfunction  I/O inaccurate due to incontinence, follow weights  Will increase by mouth Lasix to 80 mg twice a day x one day and reassess weight nursing facility. She seems to be in the 190 pound range usually and may need an increase in her dose of Lasix as an out patient.  #5 hypothyroidism  Check a TSH and 4-5 weeks. Continue home dose Synthroid 175 mcg daily.  #6 history of TIA  Continue home regimen of Aggrenox 200-25 every 12 hourly.   #7 CKD 3: creatinine slight bump from recent baseline 1.5, suspect due to diuresis, change lasix to PO, Urine output good, DC renal USG.  #8 dementia  Continue home regimen-I. suspect she has mild dementia, but she is very clear and can orient to time place and person-I. would recommend individualization of this dose and discontinuation if but not  #9 hypertension  Stable. Follow. Resume home regimen of amlodipine 5 mg   #10 h/o Temporal arteritis: back on home dose of Prednisone 50 mg daily.  #11 prophylaxis  PPI for GI prophylaxis. Lovenox for DVT prophylaxis.    Discharge Exam: Filed Vitals:   09/06/12 1440 09/06/12 2013 09/07/12 0439 09/07/12 0456  BP: 123/59 154/59 152/53 152/60  Pulse: 68 70 64 72  Temp: 97.2 F (36.2  C) 98.1 F (36.7 C)  97.9 F (36.6 C)  TempSrc: Oral Oral  Oral  Resp: 20 20  20   Height:      Weight:    188 lb 11.4 oz (85.6 kg)  SpO2: 97% 94%  100%   Doing well. The left. No nausea no vomiting no chest pain or shortness breath no fever no chills no other concerns  General: Alert  pleasant oriented Cardiovascular: S1-S2 no murmur rub or gallop Respiratory: Clinically clear  Discharge Instructions  Discharge Orders    Future Orders Please Complete By Expires   Diet - low sodium heart healthy      Increase activity slowly          Medication List     As of 09/07/2012 12:44 PM    STOP taking these medications         sulfamethoxazole-trimethoprim 400-80 MG per tablet   Commonly known as: BACTRIM,SEPTRA      sulfamethoxazole-trimethoprim 800-160 MG per tablet   Commonly known as: BACTRIM DS      torsemide 20 MG tablet   Commonly known as: DEMADEX      TAKE these medications         acetaminophen 325 MG tablet   Commonly known as: TYLENOL   Take 650 mg by mouth every 6 (six) hours as needed. For pain      albuterol (5 MG/ML) 0.5% nebulizer solution   Commonly known as: PROVENTIL   Take 0.5 mLs (2.5 mg total) by nebulization every 4 (four) hours as needed for wheezing or shortness of breath.      amLODipine 5 MG tablet   Commonly known as: NORVASC   Take 5 mg by mouth daily.      cetirizine 10 MG tablet   Commonly known as: ZYRTEC   Take 10 mg by mouth daily.      Cranberry 200 MG Caps   Take 200 mg by mouth 2 (two) times daily.      dipyridamole-aspirin 200-25 MG per 12 hr capsule   Commonly known as: AGGRENOX   Take 1 capsule by mouth 2 (two) times daily.      donepezil 10 MG tablet   Commonly known as: ARICEPT   Take 10 mg by mouth at bedtime.      furosemide 80 MG tablet   Commonly known as: LASIX   Take 1 tablet (80 mg total) by mouth 2 (two) times daily.      ipratropium 0.02 % nebulizer solution   Commonly known as: ATROVENT   Take 2.5 mLs (0.5 mg total) by nebulization every 4 (four) hours as needed.      levothyroxine 175 MCG tablet   Commonly known as: SYNTHROID, LEVOTHROID   Take 175 mcg by mouth every morning.      loperamide 2 MG capsule   Commonly known as: IMODIUM   Take 1 capsule (2 mg total) by mouth as needed  for diarrhea or loose stools.      multivitamin with minerals Tabs   Take 1 tablet by mouth daily.      omeprazole 20 MG capsule   Commonly known as: PRILOSEC   Take 20 mg by mouth daily.      oyster calcium 500 MG Tabs   Take 500 mg of elemental calcium by mouth 2 (two) times daily.      polycarbophil 625 MG tablet   Commonly known as: FIBERCON   Take 625 mg by mouth daily.  potassium chloride SA 20 MEQ tablet   Commonly known as: K-DUR,KLOR-CON   Take 20 mEq by mouth 2 (two) times daily.      predniSONE 5 MG tablet   Commonly known as: DELTASONE   Take 15 mg by mouth daily.      saccharomyces boulardii 250 MG capsule   Commonly known as: FLORASTOR   Take 250 mg by mouth 2 (two) times daily.      sertraline 50 MG tablet   Commonly known as: ZOLOFT   Take 50 mg by mouth daily.      simvastatin 20 MG tablet   Commonly known as: ZOCOR   Take 20 mg by mouth at bedtime.            The results of significant diagnostics from this hospitalization (including imaging, microbiology, ancillary and laboratory) are listed below for reference.    Significant Diagnostic Studies: Dg Chest Portable 1 View  09/02/2012  *RADIOLOGY REPORT*  Clinical Data: Emesis.  Congestive heart failure.  PORTABLE CHEST - 1 VIEW  Comparison: CT chest 05/14/2012.  Findings: Borderline cardiac enlargement is present.  There is no significant edema.  A small left pleural effusion is noted.  There is some associated airspace disease. The lungs are otherwise clear. The visualized soft tissues and bony thorax are unremarkable.  IMPRESSION:  1.  Borderline cardiomegaly without evidence for failure. 2.  Small left pleural effusion versus airspace disease.   Original Report Authenticated By: Marin Roberts, M.D.    Dg Abd Portable 1v  09/02/2012  *RADIOLOGY REPORT*  Clinical Data: Abdominal pain.  PORTABLE ABDOMEN - 1 VIEW  Comparison: 01/24/2011.  Findings: Cholecystectomy clips are present in the  right upper quadrant.  Nonobstructive bowel gas pattern.  Calcified phleboliths in the anatomic pelvis.  Stool and bowel gas are present within the rectosigmoid.  No plain film evidence of free air on this supine radiograph.  IMPRESSION: No acute abnormality.  Nonobstructive bowel gas pattern. Cholecystectomy.   Original Report Authenticated By: Andreas Newport, M.D.     Microbiology: Recent Results (from the past 240 hour(s))  CULTURE, BLOOD (ROUTINE X 2)     Status: Normal (Preliminary result)   Collection Time   09/02/12  3:25 PM      Component Value Range Status Comment   Specimen Description BLOOD RIGHT HAND   Final    Special Requests BOTTLES DRAWN AEROBIC AND ANAEROBIC 3CC   Final    Culture  Setup Time 09/02/2012 20:57   Final    Culture     Final    Value:        BLOOD CULTURE RECEIVED NO GROWTH TO DATE CULTURE WILL BE HELD FOR 5 DAYS BEFORE ISSUING A FINAL NEGATIVE REPORT   Report Status PENDING   Incomplete   CULTURE, BLOOD (ROUTINE X 2)     Status: Normal (Preliminary result)   Collection Time   09/02/12  3:30 PM      Component Value Range Status Comment   Specimen Description BLOOD LEFT HAND   Final    Special Requests BOTTLES DRAWN AEROBIC ONLY 4CC   Final    Culture  Setup Time 09/02/2012 20:57   Final    Culture     Final    Value:        BLOOD CULTURE RECEIVED NO GROWTH TO DATE CULTURE WILL BE HELD FOR 5 DAYS BEFORE ISSUING A FINAL NEGATIVE REPORT   Report Status PENDING   Incomplete   URINE CULTURE  Status: Normal   Collection Time   09/02/12  4:09 PM      Component Value Range Status Comment   Specimen Description URINE, CATHETERIZED   Final    Special Requests NONE   Final    Culture  Setup Time 09/02/2012 19:10   Final    Colony Count >=100,000 COLONIES/ML   Final    Culture ESCHERICHIA COLI   Final    Report Status 09/04/2012 FINAL   Final    Organism ID, Bacteria ESCHERICHIA COLI   Final      Labs: Basic Metabolic Panel:  Lab 09/07/12 9147 09/06/12 0425  09/05/12 0505 09/04/12 0630 09/03/12 0644 09/02/12 1816  NA 141 140 140 135 134* --  K 3.5 3.7 3.6 3.7 3.7 --  CL 101 100 102 97 94* --  CO2 29 26 28 29 29  --  GLUCOSE 77 74 82 90 84 --  BUN 22 28* 24* 18 22 --  CREATININE 1.22* 1.71* 1.69* 1.70* 1.78* --  CALCIUM 9.0 9.0 8.9 8.8 9.1 --  MG -- -- -- -- -- 1.9  PHOS 3.1 3.4 -- -- -- --   Liver Function Tests:  Lab 09/07/12 0535 09/06/12 0425 09/03/12 0644 09/02/12 1228  AST -- -- 18 21  ALT -- -- 16 18  ALKPHOS -- -- 70 81  BILITOT -- -- 0.4 0.3  PROT -- -- 6.8 7.9  ALBUMIN 2.8* 2.7* 2.9* 3.5   No results found for this basename: LIPASE:5,AMYLASE:5 in the last 168 hours No results found for this basename: AMMONIA:5 in the last 168 hours CBC:  Lab 09/07/12 0535 09/03/12 0644 09/02/12 1816 09/02/12 1228  WBC 6.8 8.6 10.8* 18.7*  NEUTROABS -- 5.7 -- 13.7*  HGB 10.9* 11.6* 11.1* 12.6  HCT 34.4* 35.7* 33.8* 37.6  MCV 92.5 89.7 88.9 89.1  PLT 263 265 251 293   Cardiac Enzymes:  Lab 09/04/12 1730 09/04/12 1142 09/04/12 0630 09/04/12 0147 09/03/12 1750 09/03/12 0644 09/03/12 0022 09/02/12 1816 09/02/12 1228  CKTOTAL 33 29 30 31  33 -- -- -- --  CKMB 2.2 2.1 2.2 2.0 2.3 -- -- -- --  CKMBINDEX -- -- -- -- -- -- -- -- --  TROPONINI -- -- -- -- -- <0.30 <0.30 <0.30 <0.30   BNP: BNP (last 3 results)  Basename 09/03/12 0644 09/02/12 1228 05/16/12 0610  PROBNP 936.0* 1222.0* 4914.0*   CBG: No results found for this basename: GLUCAP:5 in the last 168 hours     Signed:  Rhetta Mura  [facility] 09/07/2012, 12:38 PM

## 2012-09-07 NOTE — Progress Notes (Signed)
Spoke with Asher Muir at the [facility]- they want patient to return to their facility and feel that they can meet her needs.  Patient currently is followed by [facility] and RNCM will reset this service up for patient. OK per MD for d/c today to [facility]- spoke to daughter Angelique Blonder- she is out of town and requested EMS transport. Patient has dementia and is at risk for falls.  Nursing notified of d/c and FL2 updated;  EMS requested.Patient will require nebulize treatments at the facility;  Notified RNCM -Kim who will order. No further CSW needs identified.  Lorri Frederick. [place]  [phone]

## 2012-09-07 NOTE — Progress Notes (Signed)
Monitor tech notified RN that pt HR down to 38bpm non-sustaining. BP 152/53, HR 64. MD notified. Will continue to monitor and assess. Jobe Igo A RN, 09/07/2012 4:41 AM

## 2012-09-07 NOTE — Progress Notes (Signed)
Physical Therapy Treatment Patient Details Name: LIBERTI APPLETON MRN: [id number] DOB: 1929/10/14 Today's Date: 09/07/2012 Time: 4098-1191 PT Time Calculation (min): 14 min  PT Assessment / Plan / Recommendation Comments on Treatment Session  Improved mobility today. Will need 24 hour assist and minA for all transfers and gait when she discharges. If ALF able to provide this I would be OK with d/c back to ALF. Otherwise she will need SNF.     Follow Up Recommendations  Home health PT;Supervision/Assistance - 24 hour     Does the patient have the potential to tolerate intense rehabilitation     Barriers to Discharge        Equipment Recommendations  None recommended by PT    Recommendations for Other Services    Frequency Min 3X/week   Plan Frequency remains appropriate;Discharge plan needs to be updated    Precautions / Restrictions Precautions Precautions: Fall Restrictions Weight Bearing Restrictions: No       Mobility  Bed Mobility Bed Mobility: Not assessed Transfers Transfers: Sit to Stand;Stand to Sit Sit to Stand: 4: Min assist;With upper extremity assist;From chair/3-in-1 (sit<>stand x2) Stand to Sit: 4: Min assist;With upper extremity assist;To chair/3-in-1 Details for Transfer Assistance: cues for positioning and facilitation for power up Ambulation/Gait Ambulation/Gait Assistance: 4: Min assist Ambulation Distance (Feet): 80 Feet (40 ft + 32ft (seated rest)) Assistive device: Rolling walker Ambulation/Gait Assistance Details: cues for tall posture and safe maneuvering of RW, min tactile assist for stability; fatigues and c/o right LE weakness needing seated rest break but able to walk back to room after rest Gait Pattern: Step-through pattern;Trunk flexed     PT Goals Acute Rehab PT Goals PT Goal: Sit to Stand - Progress: Progressing toward goal PT Goal: Stand to Sit - Progress: Progressing toward goal PT Transfer Goal: Bed to Chair/Chair to Bed -  Progress: Progressing toward goal PT Goal: Stand - Progress: Progressing toward goal PT Goal: Ambulate - Progress: Progressing toward goal  Visit Information  Last PT Received On: 09/07/12 Assistance Needed: +1 (chair follow)    Subjective Data  Subjective: I get to go home today.    Cognition  Overall Cognitive Status: History of cognitive impairments - at baseline Arousal/Alertness: Awake/alert Orientation Level: Appears intact for tasks assessed Behavior During Session: [facility] for tasks performed    Balance     End of Session PT - End of Session Equipment Utilized During Treatment: Gait belt Activity Tolerance: Patient tolerated treatment well Patient left: in chair;with call bell/phone within reach Nurse Communication: Mobility status   GP     [facility] HELEN 09/07/2012, 1:56 PM

## 2012-09-07 NOTE — Progress Notes (Signed)
09/07/12 1430 Pt. wishes to go back to Assisted Living with [facility] services.  [facility] providing HH RN, and PT.  Pt. to be dc back to ALF today.  Tera Mater, RN, BSN NCM [phone]

## 2012-09-07 NOTE — Progress Notes (Signed)
Pt discharged to [facility] per ambulance daughter aware of transfer Pt has all her belongings. Marisa Cyphers RN

## 2012-09-08 LAB — CULTURE, BLOOD (ROUTINE X 2): Culture: NO GROWTH

## 2012-10-08 ENCOUNTER — Encounter (HOSPITAL_COMMUNITY): Payer: Self-pay | Admitting: Family Medicine

## 2012-10-08 ENCOUNTER — Emergency Department (HOSPITAL_COMMUNITY): Payer: Medicare Other

## 2012-10-08 ENCOUNTER — Inpatient Hospital Stay (HOSPITAL_COMMUNITY)
Admission: EM | Admit: 2012-10-08 | Discharge: 2012-10-12 | DRG: 871 | Disposition: A | Discharge status: Skilled Nursing Facility | Payer: Medicare Other | Attending: Internal Medicine | Admitting: Internal Medicine

## 2012-10-08 DIAGNOSIS — Z9889 Other specified postprocedural states: Secondary | ICD-10-CM

## 2012-10-08 DIAGNOSIS — Z8673 Personal history of transient ischemic attack (TIA), and cerebral infarction without residual deficits: Secondary | ICD-10-CM

## 2012-10-08 DIAGNOSIS — R0902 Hypoxemia: Secondary | ICD-10-CM

## 2012-10-08 DIAGNOSIS — C189 Malignant neoplasm of colon, unspecified: Secondary | ICD-10-CM

## 2012-10-08 DIAGNOSIS — R0603 Acute respiratory distress: Secondary | ICD-10-CM

## 2012-10-08 DIAGNOSIS — F329 Major depressive disorder, single episode, unspecified: Secondary | ICD-10-CM

## 2012-10-08 DIAGNOSIS — Z85038 Personal history of other malignant neoplasm of large intestine: Secondary | ICD-10-CM

## 2012-10-08 DIAGNOSIS — E876 Hypokalemia: Secondary | ICD-10-CM

## 2012-10-08 DIAGNOSIS — Z9981 Dependence on supplemental oxygen: Secondary | ICD-10-CM

## 2012-10-08 DIAGNOSIS — D72829 Elevated white blood cell count, unspecified: Secondary | ICD-10-CM

## 2012-10-08 DIAGNOSIS — I10 Essential (primary) hypertension: Secondary | ICD-10-CM

## 2012-10-08 DIAGNOSIS — R600 Localized edema: Secondary | ICD-10-CM

## 2012-10-08 DIAGNOSIS — G629 Polyneuropathy, unspecified: Secondary | ICD-10-CM

## 2012-10-08 DIAGNOSIS — I248 Other forms of acute ischemic heart disease: Secondary | ICD-10-CM | POA: Diagnosis present

## 2012-10-08 DIAGNOSIS — I2489 Other forms of acute ischemic heart disease: Secondary | ICD-10-CM

## 2012-10-08 DIAGNOSIS — G459 Transient cerebral ischemic attack, unspecified: Secondary | ICD-10-CM

## 2012-10-08 DIAGNOSIS — Z66 Do not resuscitate: Secondary | ICD-10-CM | POA: Diagnosis present

## 2012-10-08 DIAGNOSIS — G9349 Other encephalopathy: Secondary | ICD-10-CM | POA: Diagnosis present

## 2012-10-08 DIAGNOSIS — R6520 Severe sepsis without septic shock: Secondary | ICD-10-CM | POA: Diagnosis present

## 2012-10-08 DIAGNOSIS — M316 Other giant cell arteritis: Secondary | ICD-10-CM

## 2012-10-08 DIAGNOSIS — L89159 Pressure ulcer of sacral region, unspecified stage: Secondary | ICD-10-CM

## 2012-10-08 DIAGNOSIS — E785 Hyperlipidemia, unspecified: Secondary | ICD-10-CM

## 2012-10-08 DIAGNOSIS — G009 Bacterial meningitis, unspecified: Secondary | ICD-10-CM

## 2012-10-08 DIAGNOSIS — Z881 Allergy status to other antibiotic agents status: Secondary | ICD-10-CM

## 2012-10-08 DIAGNOSIS — R509 Fever, unspecified: Secondary | ICD-10-CM

## 2012-10-08 DIAGNOSIS — I5030 Unspecified diastolic (congestive) heart failure: Secondary | ICD-10-CM

## 2012-10-08 DIAGNOSIS — N179 Acute kidney failure, unspecified: Secondary | ICD-10-CM

## 2012-10-08 DIAGNOSIS — I5032 Chronic diastolic (congestive) heart failure: Secondary | ICD-10-CM

## 2012-10-08 DIAGNOSIS — G609 Hereditary and idiopathic neuropathy, unspecified: Secondary | ICD-10-CM | POA: Diagnosis present

## 2012-10-08 DIAGNOSIS — J9600 Acute respiratory failure, unspecified whether with hypoxia or hypercapnia: Secondary | ICD-10-CM

## 2012-10-08 DIAGNOSIS — R7989 Other specified abnormal findings of blood chemistry: Secondary | ICD-10-CM

## 2012-10-08 DIAGNOSIS — Z886 Allergy status to analgesic agent status: Secondary | ICD-10-CM

## 2012-10-08 DIAGNOSIS — A419 Sepsis, unspecified organism: Principal | ICD-10-CM

## 2012-10-08 DIAGNOSIS — IMO0002 Reserved for concepts with insufficient information to code with codable children: Secondary | ICD-10-CM

## 2012-10-08 DIAGNOSIS — Z79899 Other long term (current) drug therapy: Secondary | ICD-10-CM

## 2012-10-08 DIAGNOSIS — N390 Urinary tract infection, site not specified: Secondary | ICD-10-CM

## 2012-10-08 DIAGNOSIS — I509 Heart failure, unspecified: Secondary | ICD-10-CM

## 2012-10-08 DIAGNOSIS — F0390 Unspecified dementia without behavioral disturbance: Secondary | ICD-10-CM

## 2012-10-08 DIAGNOSIS — R079 Chest pain, unspecified: Secondary | ICD-10-CM

## 2012-10-08 DIAGNOSIS — F32A Depression, unspecified: Secondary | ICD-10-CM

## 2012-10-08 DIAGNOSIS — J189 Pneumonia, unspecified organism: Secondary | ICD-10-CM

## 2012-10-08 DIAGNOSIS — Z8719 Personal history of other diseases of the digestive system: Secondary | ICD-10-CM

## 2012-10-08 DIAGNOSIS — D649 Anemia, unspecified: Secondary | ICD-10-CM

## 2012-10-08 DIAGNOSIS — E039 Hypothyroidism, unspecified: Secondary | ICD-10-CM

## 2012-10-08 DIAGNOSIS — I5033 Acute on chronic diastolic (congestive) heart failure: Secondary | ICD-10-CM

## 2012-10-08 DIAGNOSIS — Z888 Allergy status to other drugs, medicaments and biological substances status: Secondary | ICD-10-CM

## 2012-10-08 DIAGNOSIS — J9620 Acute and chronic respiratory failure, unspecified whether with hypoxia or hypercapnia: Secondary | ICD-10-CM | POA: Diagnosis present

## 2012-10-08 DIAGNOSIS — I119 Hypertensive heart disease without heart failure: Secondary | ICD-10-CM

## 2012-10-08 DIAGNOSIS — R531 Weakness: Secondary | ICD-10-CM

## 2012-10-08 DIAGNOSIS — E8770 Fluid overload, unspecified: Secondary | ICD-10-CM

## 2012-10-08 DIAGNOSIS — R4182 Altered mental status, unspecified: Secondary | ICD-10-CM

## 2012-10-08 LAB — CBC WITH DIFFERENTIAL/PLATELET
Basophils Absolute: 0 10*3/uL (ref 0.0–0.1)
Basophils Relative: 0 % (ref 0–1)
Eosinophils Absolute: 0 10*3/uL (ref 0.0–0.7)
HCT: 45 % (ref 36.0–46.0)
Hemoglobin: 12.5 g/dL (ref 12.0–15.0)
Lymphocytes Relative: 46 % (ref 12–46)
Monocytes Relative: 4 % (ref 3–12)
Neutro Abs: 11.9 10*3/uL — ABNORMAL HIGH (ref 1.7–7.7)
Neutrophils Relative %: 50 % (ref 43–77)
RDW: 15.3 % (ref 11.5–15.5)
WBC: 23.8 10*3/uL — ABNORMAL HIGH (ref 4.0–10.5)

## 2012-10-08 LAB — CSF CELL COUNT WITH DIFFERENTIAL
Eosinophils, CSF: 0 % (ref 0–1)
Lymphs, CSF: 5 % — ABNORMAL LOW (ref 40–80)
Monocyte-Macrophage-Spinal Fluid: 6 % — ABNORMAL LOW (ref 15–45)
RBC Count, CSF: 23 /mm3 — ABNORMAL HIGH
Segmented Neutrophils-CSF: 89 % — ABNORMAL HIGH (ref 0–6)
Segmented Neutrophils-CSF: 93 % — ABNORMAL HIGH (ref 0–6)
WBC, CSF: 455 /mm3 (ref 0–5)
WBC, CSF: 480 /mm3 (ref 0–5)

## 2012-10-08 LAB — COMPREHENSIVE METABOLIC PANEL
ALT: 21 U/L (ref 0–35)
Albumin: 3.2 g/dL — ABNORMAL LOW (ref 3.5–5.2)
Alkaline Phosphatase: 85 U/L (ref 39–117)
BUN: 15 mg/dL (ref 6–23)
Chloride: 91 mEq/L — ABNORMAL LOW (ref 96–112)
Potassium: 3.9 mEq/L (ref 3.5–5.1)
Sodium: 136 mEq/L (ref 135–145)
Total Bilirubin: 0.2 mg/dL — ABNORMAL LOW (ref 0.3–1.2)
Total Protein: 7.5 g/dL (ref 6.0–8.3)

## 2012-10-08 LAB — POCT I-STAT 3, ART BLOOD GAS (G3+)
Acid-base deficit: 4 mmol/L — ABNORMAL HIGH (ref 0.0–2.0)
Patient temperature: 98.6

## 2012-10-08 LAB — URINALYSIS, ROUTINE W REFLEX MICROSCOPIC
Bilirubin Urine: NEGATIVE
Glucose, UA: NEGATIVE mg/dL
Ketones, ur: NEGATIVE mg/dL
Leukocytes, UA: NEGATIVE
Protein, ur: 100 mg/dL — AB
pH: 7.5 (ref 5.0–8.0)

## 2012-10-08 LAB — PROTIME-INR
INR: 1.04 (ref 0.00–1.49)
Prothrombin Time: 13.5 seconds (ref 11.6–15.2)

## 2012-10-08 LAB — POCT I-STAT TROPONIN I: Troponin i, poc: 1.06 ng/mL (ref 0.00–0.08)

## 2012-10-08 LAB — GRAM STAIN

## 2012-10-08 LAB — CG4 I-STAT (LACTIC ACID)
Lactic Acid, Venous: 5.53 mmol/L — ABNORMAL HIGH (ref 0.5–2.2)
Lactic Acid, Venous: 2.02 mmol/L (ref 0.5–2.2)

## 2012-10-08 MED ORDER — PIPERACILLIN-TAZOBACTAM 3.375 G IVPB 30 MIN
3.3750 g | Freq: Once | INTRAVENOUS | Status: AC
  Administered 2012-10-08: 3.375 g via INTRAVENOUS
  Filled 2012-10-08: qty 50

## 2012-10-08 MED ORDER — VANCOMYCIN HCL 10 G IV SOLR
1250.0000 mg | Freq: Once | INTRAVENOUS | Status: AC
  Administered 2012-10-08: 1250 mg via INTRAVENOUS
  Filled 2012-10-08: qty 1250

## 2012-10-08 MED ORDER — SODIUM CHLORIDE 0.9 % IV BOLUS (SEPSIS)
1000.0000 mL | Freq: Once | INTRAVENOUS | Status: AC
  Administered 2012-10-08: 1000 mL via INTRAVENOUS

## 2012-10-08 MED ORDER — SODIUM CHLORIDE 0.9 % IV SOLN
2.0000 g | Freq: Once | INTRAVENOUS | Status: AC
  Administered 2012-10-09: 2 g via INTRAVENOUS
  Filled 2012-10-08: qty 2000

## 2012-10-08 MED ORDER — SODIUM CHLORIDE 0.9 % IV BOLUS (SEPSIS)
500.0000 mL | Freq: Once | INTRAVENOUS | Status: AC
  Administered 2012-10-08: 1000 mL via INTRAVENOUS

## 2012-10-08 MED ORDER — DEXTROSE 5 % IV SOLN
2.0000 g | Freq: Once | INTRAVENOUS | Status: AC
  Administered 2012-10-08: 2 g via INTRAVENOUS
  Filled 2012-10-08: qty 2

## 2012-10-08 NOTE — Progress Notes (Signed)
Chaplain Note:  Chaplain visited with family of pt while they waited outside pt's room.  Chaplain provided spiritual comfort, support, and prayer for pt's family, who expressed appreciation for chaplain support.  Chaplain will follow up as needed.  10/08/12 1735  Clinical Encounter Type  Visited With Family  Visit Type Spiritual support  Referral From Nurse  Spiritual Encounters  Spiritual Needs Emotional  Stress Factors  Patient Stress Factors Major life changes;Health changes  Family Stress Factors Major life changes;Lack of knowledge   Verdie Shire, Chaplain [phone]

## 2012-10-08 NOTE — ED Notes (Signed)
Per EMS, pt from nursing facility and called out for unresponsive GCS of 3. Pt DNR. Febrile and started cooling measures. IO left shoulder.

## 2012-10-08 NOTE — ED Notes (Signed)
Critical labs reported to Dr.Pollina

## 2012-10-08 NOTE — ED Provider Notes (Signed)
History     CSN: [id number]  Arrival date & time 10/08/12  1709   First MD Initiated Contact with Patient 10/08/12 1724      No chief complaint on file.   (Consider location/radiation/quality/duration/timing/severity/associated sxs/prior treatment) Patient is a 79 y.o. female presenting with general illness and altered mental status. The history is provided by the EMS personnel. The history is limited by the condition of the patient.  Illness  The current episode started today. The problem occurs continuously. The problem has been rapidly worsening. The problem is severe. Nothing relieves the symptoms. Nothing aggravates the symptoms. Associated symptoms include a fever.  Altered Mental Status This is a new problem. The current episode started today. The problem occurs constantly. The problem has been unchanged. Associated symptoms include a fever. She has tried nothing for the symptoms. The treatment provided no relief.    Past Medical History  Diagnosis Date  . Peripheral neuropathy   . Hypertension   . TIA (transient ischemic attack)   . Elevated sed rate   . Temporal arteritis     chronic steroids  . Hypothyroidism   . Colon cancer     colon  . History of hernia repair   . S/P rotator cuff surgery     right  . Diastolic CHF   . Cardiomegaly - hypertensive   . Chronic anemia   . Chronic steroid use   . Dementia   . Shortness of breath   . Hyperlipidemia   . CHF (congestive heart failure)   . Cardiomyopathy due to hypertension   . Dependence on supplemental oxygen   . Chronic UTI   . Dementia     Past Surgical History  Procedure Date  . Tonsillectomy   . Appendectomy   . Abdominal hysterectomy   . Colectomy     No family history on file.  History  Substance Use Topics  . Smoking status: Never Smoker   . Smokeless tobacco: Not on file  . Alcohol Use: No    OB History    Grav Para Term Preterm Abortions TAB SAB Ect Mult Living                   Review of Systems  Unable to perform ROS Constitutional: Positive for fever.  Psychiatric/Behavioral: Positive for altered mental status.  All other systems reviewed and are negative.    Allergies  Apap; Ciprofloxacin; Codeine; Demerol; Morphine and related; Nitrofurantoin monohyd macro; Pregabalin; and Septra  Home Medications   Current Outpatient Rx  Name  Route  Sig  Dispense  Refill  . ACETAMINOPHEN 325 MG PO TABS   Oral   Take 650 mg by mouth every 6 (six) hours as needed. For pain         . ALBUTEROL SULFATE (2.5 MG/3ML) 0.083% IN NEBU   Nebulization   Take 2.5 mg by nebulization every 4 (four) hours as needed. For shortness of breath         . AMLODIPINE BESYLATE 5 MG PO TABS   Oral   Take 5 mg by mouth daily.         Marland Kitchen CRANBERRY 200 MG PO CAPS   Oral   Take 200 mg by mouth 2 (two) times daily.         . ASPIRIN-DIPYRIDAMOLE ER 25-200 MG PO CP12   Oral   Take 1 capsule by mouth 2 (two) times daily.           . DONEPEZIL  HCL 10 MG PO TABS   Oral   Take 10 mg by mouth at bedtime.          . IPRATROPIUM BROMIDE 0.02 % IN SOLN   Nebulization   Take 2.5 mLs (0.5 mg total) by nebulization every 4 (four) hours as needed.   75 mL   0   . LEVOTHYROXINE SODIUM 175 MCG PO TABS   Oral   Take 175 mcg by mouth every morning.         Marland Kitchen LOPERAMIDE HCL 2 MG PO CAPS   Oral   Take 1 capsule (2 mg total) by mouth as needed for diarrhea or loose stools.   30 capsule   0   . ADULT MULTIVITAMIN W/MINERALS CH   Oral   Take 1 tablet by mouth daily.           Marland Kitchen OMEPRAZOLE 20 MG PO CPDR   Oral   Take 20 mg by mouth daily.           Jeralyn Bennett CALCIUM 500 MG PO TABS   Oral   Take 500 mg of elemental calcium by mouth 2 (two) times daily.         Marland Kitchen CALCIUM POLYCARBOPHIL 625 MG PO TABS   Oral   Take 625 mg by mouth daily.         Marland Kitchen POTASSIUM CHLORIDE CRYS ER 20 MEQ PO TBCR   Oral   Take 20 mEq by mouth 2 (two) times daily.         Marland Kitchen  PREDNISONE 5 MG PO TABS   Oral   Take 10-15 mg by mouth daily. Alternate 10mg  and 15 mg doses         . SACCHAROMYCES BOULARDII 250 MG PO CAPS   Oral   Take 250 mg by mouth 2 (two) times daily.         . SERTRALINE HCL 50 MG PO TABS   Oral   Take 50 mg by mouth daily.           Marland Kitchen SIMVASTATIN 20 MG PO TABS   Oral   Take 20 mg by mouth at bedtime.           . SULFAMETHOXAZOLE-TRIMETHOPRIM 400-80 MG PO TABS   Oral   Take 1 tablet by mouth at bedtime.         . TORSEMIDE 20 MG PO TABS   Oral   Take 60 mg by mouth daily.           There were no vitals taken for this visit.  Physical Exam  Vitals reviewed. Constitutional: She appears well-developed and well-nourished. She has a sickly appearance. She appears ill. She appears distressed.  HENT:  Head: Normocephalic and atraumatic.  Eyes: EOM are normal. Pupils are equal, round, and reactive to light.  Neck: Normal range of motion. Neck supple. No JVD present.  Cardiovascular: Regular rhythm.  Tachycardia present.   Pulmonary/Chest: Effort normal. No respiratory distress. She has no decreased breath sounds. She has no wheezes. She has rhonchi. She has rales.  Abdominal: Soft. She exhibits no distension.  Musculoskeletal: Normal range of motion. She exhibits no edema.  Neurological: She is unresponsive. GCS eye subscore is 1. GCS verbal subscore is 1. GCS motor subscore is 1.  Skin: Skin is warm and dry. There is cyanosis.    ED Course  LUMBAR PUNCTURE Performed by: Audelia Hives Authorized by: Gilda Crease Consent: Verbal consent obtained. Risks and benefits:  risks, benefits and alternatives were discussed Consent given by: daughter - gave verbal consent. Time out: Immediately prior to procedure a "time out" was called to verify the correct patient, procedure, equipment, support staff and site/side marked as required. Indications: evaluation for infection Anesthesia: local infiltration Local  anesthetic: lidocaine 1% without epinephrine Anesthetic total: 10 ml Preparation: Patient was prepped and draped in the usual sterile fashion. Lumbar space: L3-L4 interspace Patient's position: sitting Needle gauge: 22 Needle type: spinal needle - Quincke tip Needle length: 3.5 in Number of attempts: 4 Fluid appearance: blood-tinged Tubes of fluid: 4 Total volume: 5 ml Post-procedure: site cleaned, pressure dressing applied and adhesive bandage applied Patient tolerance: Patient tolerated the procedure well with no immediate complications.   (including critical care time)  Results for orders placed during the hospital encounter of 10/08/12  CBC WITH DIFFERENTIAL      Component Value Range   WBC 23.8 (*) 4.0 - 10.5 K/uL   RBC 4.04  3.87 - 5.11 MIL/uL   Hemoglobin 12.5  12.0 - 15.0 g/dL   HCT 40.9  81.1 - 91.4 %   MCV 111.4 (*) 78.0 - 100.0 fL   MCH 30.9  26.0 - 34.0 pg   MCHC 27.8 (*) 30.0 - 36.0 g/dL   RDW 78.2  95.6 - 21.3 %   Platelets 231  150 - 400 K/uL   Neutrophils Relative 50  43 - 77 %   Lymphocytes Relative 46  12 - 46 %   Monocytes Relative 4  3 - 12 %   Eosinophils Relative 0  0 - 5 %   Basophils Relative 0  0 - 1 %   Neutro Abs 11.9 (*) 1.7 - 7.7 K/uL   Lymphs Abs 10.9 (*) 0.7 - 4.0 K/uL   Monocytes Absolute 1.0  0.1 - 1.0 K/uL   Eosinophils Absolute 0.0  0.0 - 0.7 K/uL   Basophils Absolute 0.0  0.0 - 0.1 K/uL   WBC Morphology ATYPICAL LYMPHOCYTES    COMPREHENSIVE METABOLIC PANEL      Component Value Range   Sodium 136  135 - 145 mEq/L   Potassium 3.9  3.5 - 5.1 mEq/L   Chloride 91 (*) 96 - 112 mEq/L   CO2 21  19 - 32 mEq/L   Glucose, Bld 241 (*) 70 - 99 mg/dL   BUN 15  6 - 23 mg/dL   Creatinine, Ser 0.86 (*) 0.50 - 1.10 mg/dL   Calcium 9.5  8.4 - 57.8 mg/dL   Total Protein 7.5  6.0 - 8.3 g/dL   Albumin 3.2 (*) 3.5 - 5.2 g/dL   AST 30  0 - 37 U/L   ALT 21  0 - 35 U/L   Alkaline Phosphatase 85  39 - 117 U/L   Total Bilirubin 0.2 (*) 0.3 - 1.2 mg/dL    GFR calc non Af Amer 30 (*) >90 mL/min   GFR calc Af Amer 35 (*) >90 mL/min  URINALYSIS, ROUTINE W REFLEX MICROSCOPIC      Component Value Range   Color, Urine YELLOW  YELLOW   APPearance CLEAR  CLEAR   Specific Gravity, Urine 1.010  1.005 - 1.030   pH 7.5  5.0 - 8.0   Glucose, UA NEGATIVE  NEGATIVE mg/dL   Hgb urine dipstick MODERATE (*) NEGATIVE   Bilirubin Urine NEGATIVE  NEGATIVE   Ketones, ur NEGATIVE  NEGATIVE mg/dL   Protein, ur 469 (*) NEGATIVE mg/dL   Urobilinogen, UA 0.2  0.0 -  1.0 mg/dL   Nitrite NEGATIVE  NEGATIVE   Leukocytes, UA NEGATIVE  NEGATIVE  PRO B NATRIURETIC PEPTIDE      Component Value Range   Pro B Natriuretic peptide (BNP) 1356.0 (*) 0 - 450 pg/mL  PROTIME-INR      Component Value Range   Prothrombin Time 13.5  11.6 - 15.2 seconds   INR 1.04  0.00 - 1.49  POCT I-STAT 3, BLOOD GAS (G3+)      Component Value Range   pH, Arterial 7.305 (*) 7.350 - 7.450   pCO2 arterial 45.7 (*) 35.0 - 45.0 mmHg   pO2, Arterial 58.0 (*) 80.0 - 100.0 mmHg   Bicarbonate 22.8  20.0 - 24.0 mEq/L   TCO2 24  0 - 100 mmol/L   O2 Saturation 87.0     Acid-base deficit 4.0 (*) 0.0 - 2.0 mmol/L   Patient temperature 98.6 F     Collection site RADIAL, ALLEN'S TEST ACCEPTABLE     Drawn by RT     Sample type ARTERIAL    URINE MICROSCOPIC-ADD ON      Component Value Range   Squamous Epithelial / LPF RARE  RARE   WBC, UA 0-2  <3 WBC/hpf   RBC / HPF 0-2  <3 RBC/hpf   Bacteria, UA RARE  RARE   Casts HYALINE CASTS (*) NEGATIVE   Urine-Other MUCOUS PRESENT    GLUCOSE, CSF      Component Value Range   Glucose, CSF 82 (*) 43 - 76 mg/dL  PROTEIN, CSF      Component Value Range   Total  Protein, CSF 50 (*) 15 - 45 mg/dL  CSF CELL COUNT WITH DIFFERENTIAL      Component Value Range   Tube # tube 1     Color, CSF COLORLESS  COLORLESS   Appearance, CSF HAZY (*) CLEAR   Supernatant NOT INDICATED     RBC Count, CSF 23 (*) 0 /cu mm   WBC, CSF 480 (*) 0 - 5 /cu mm   Segmented  Neutrophils-CSF 89 (*) 0 - 6 %   Lymphs, CSF 5 (*) 40 - 80 %   Monocyte-Macrophage-Spinal Fluid 6 (*) 15 - 45 %   Eosinophils, CSF 0  0 - 1 %  CSF CELL COUNT WITH DIFFERENTIAL      Component Value Range   Tube # tube 4     Color, CSF COLORLESS  COLORLESS   Appearance, CSF CLOUDY (*) CLEAR   Supernatant NOT INDICATED     RBC Count, CSF 2550 (*) 0 /cu mm   WBC, CSF 455 (*) 0 - 5 /cu mm   Segmented Neutrophils-CSF 93 (*) 0 - 6 %   Lymphs, CSF 2 (*) 40 - 80 %   Monocyte-Macrophage-Spinal Fluid 5 (*) 15 - 45 %   Eosinophils, CSF 0  0 - 1 %  GRAM STAIN      Component Value Range   Specimen Description CSF     Special Requests NO 2 1CC     Gram Stain       Value: CYTOSPIN SLIDE     WBC PRESENT,BOTH PMN AND MONONUCLEAR     NO ORGANISMS SEEN   Report Status 10/08/2012 FINAL    LACTIC ACID, PLASMA      Component Value Range   Lactic Acid, Venous 3.2 (*) 0.5 - 2.2 mmol/L  TROPONIN I      Component Value Range   Troponin I 1.18 (*) <0.30 ng/mL  CG4  I-STAT (LACTIC ACID)      Component Value Range   Lactic Acid, Venous 5.53 (*) 0.5 - 2.2 mmol/L  POCT I-STAT TROPONIN I      Component Value Range   Troponin i, poc 1.06 (*) 0.00 - 0.08 ng/mL   Comment NOTIFIED PHYSICIAN     Comment 3           CG4 I-STAT (LACTIC ACID)      Component Value Range   Lactic Acid, Venous 2.02  0.5 - 2.2 mmol/L  POCT I-STAT TROPONIN I      Component Value Range   Troponin i, poc 1.63 (*) 0.00 - 0.08 ng/mL   Comment NOTIFIED PHYSICIAN     Comment 3              CT Head Wo Contrast (Final result)   Result time:10/08/12 1927    Final result by Rad Results In Interface (10/08/12 19:27:40)    Narrative:   *RADIOLOGY REPORT*  Clinical Data: Fever, unresponsive.  CT HEAD WITHOUT CONTRAST  Technique: Contiguous axial images were obtained from the base of the skull through the vertex without contrast.  Comparison: 05/14/2012  Findings: Atherosclerotic and physiologic intracranial calcifications.  Motion degraded many images, requiring rescanning. Diffuse parenchymal atrophy. Patchy areas of hypoattenuation in deep and periventricular white matter bilaterally. Prominence of lateral and third ventricles as before. Negative for acute intracranial hemorrhage, mass lesion, acute infarction, midline shift, or mass-effect. Acute infarct may be inapparent on noncontrast CT. Ventricles and sulci symmetric. Bone windows demonstrate no focal lesion.  IMPRESSION:  1. Negative for bleed or other acute intracranial process.  2. Atrophy and nonspecific white matter changes.   Original Report Authenticated By: D. Andria Rhein, MD             DG Chest Portable 1 View (Final result)   Result time:10/08/12 1823    Final result by Rad Results In Interface (10/08/12 18:23:09)    Narrative:   *RADIOLOGY REPORT*  Clinical Data: CPR, fever.  PORTABLE CHEST - 1 VIEW  Comparison: 09/02/2012  Findings: Stable cardiomegaly. Atheromatous aorta. Some increase in mild interstitial edema and pulmonary vascular congestion. Persistent blunting of the left lateral costophrenic angle. Regional bones unremarkable.  IMPRESSION:  1. Stable cardiomegaly with some increase in mild interstitial edema.   Original Report Authenticated By: D. Andria Rhein, MD        Date: 10/08/2012  Rate: 103   Rhythm: sinus tachycardia  QRS Axis: normal  Intervals: normal  ST/T Wave abnormalities: ST depressions anteriorly and ST depressions laterally  Conduction Disutrbances:RBBB  Narrative Interpretation:   Old EKG Reviewed: changes noted    No results found.   No diagnosis found.    MDM  Pt w/ PMHx of CHF, TIA, lives in SNF, is DNR now w/ decreased mental status and fever. States normal state of health yesterday. Today noted to be confused and not following commands. EMS was called. Noted to be febrile - tympanic 102, EMS noted seizure like activity - 1 minute of generalized tonic clonic. GCS 3 en  route, resp assisted w/ BVM. Not intubated per family wishes. On arrival to ED GCS 3, normotensive, febrile, sinus tachycardic, hypoxic w/ sats in 60s, cyanotic. No LE edema or JVD, diminished bilaterally.   DDx/Plan: likely sepsis 2/2 resp or urinary source. However w/ seizure activity - possible meningitis. Possible CVA - pt is on aggrenox. Will check CXR, CT head, ECG, troponin, bnp, cbc, cmp, lactic acid, coags, blood gas  and place on bipap. Will start on vanc/zosyn/ceftriaxone and give 500CC IVF. D/w family and confirmed at this time no intubation - continue DNR.   Course: reassessed, vitals stable, mental status improved - GCS now 14, weaned off of bipap and stable on NRB. Lactic acid 5.5 - given additional 1L IVF, WBC 23k, BNP 1356, troponin 1.06, ECG reveals RBBB w/ TWI diffusely. Pt denies chest pain - troponin and ST depression likely 2/2 demand ischemia. U/a clear, CXR w/out acute patchy infiltrate. CT head - NAICA. Blood has mixed venous - pH 7.3.  LP performed to r/o meningitis - reveals elevated WBC and neutrophil, protein and glucose, significant concern for meningitis - ampicillin 2grams given.  Pt denies any complaints.  At this point - severe sepsis likely 2/2 meningitis. D/w intensivist, internal medicine and cardiology. Repeat lactic acid improved to 3.2. Repeat troponin 1.18. Pt admitted to step down unit in serious condition.    1. Severe sepsis   2. Fever   3. Leukocytosis   4. Elevated troponin            Audelia Hives, MD 10/09/12 0981  Audelia Hives, MD 10/09/12 [phone]

## 2012-10-08 NOTE — ED Notes (Signed)
Bipap d/c per EDP request. Respiratory called to d/c bipap. Pt placed on NRB, sats 100%. No respiratory distress noted

## 2012-10-08 NOTE — ED Notes (Signed)
Critical Labs reported to Dr.Pollina 

## 2012-10-09 ENCOUNTER — Inpatient Hospital Stay (HOSPITAL_COMMUNITY): Payer: Medicare Other

## 2012-10-09 DIAGNOSIS — R7989 Other specified abnormal findings of blood chemistry: Secondary | ICD-10-CM

## 2012-10-09 DIAGNOSIS — I248 Other forms of acute ischemic heart disease: Secondary | ICD-10-CM | POA: Diagnosis present

## 2012-10-09 DIAGNOSIS — G009 Bacterial meningitis, unspecified: Secondary | ICD-10-CM | POA: Diagnosis present

## 2012-10-09 DIAGNOSIS — G039 Meningitis, unspecified: Secondary | ICD-10-CM

## 2012-10-09 DIAGNOSIS — R4182 Altered mental status, unspecified: Secondary | ICD-10-CM

## 2012-10-09 DIAGNOSIS — J9600 Acute respiratory failure, unspecified whether with hypoxia or hypercapnia: Secondary | ICD-10-CM

## 2012-10-09 DIAGNOSIS — A419 Sepsis, unspecified organism: Secondary | ICD-10-CM | POA: Diagnosis present

## 2012-10-09 LAB — COMPREHENSIVE METABOLIC PANEL
AST: 40 U/L — ABNORMAL HIGH (ref 0–37)
CO2: 28 mEq/L (ref 19–32)
Calcium: 8.8 mg/dL (ref 8.4–10.5)
Creatinine, Ser: 1.39 mg/dL — ABNORMAL HIGH (ref 0.50–1.10)
GFR calc Af Amer: 40 mL/min — ABNORMAL LOW (ref >90)
GFR calc non Af Amer: 35 mL/min — ABNORMAL LOW (ref >90)
Glucose, Bld: 149 mg/dL — ABNORMAL HIGH (ref 70–99)
Sodium: 138 mEq/L (ref 135–145)
Total Protein: 6.7 g/dL (ref 6.0–8.3)

## 2012-10-09 LAB — CBC WITH DIFFERENTIAL/PLATELET
Eosinophils Relative: 0 % (ref 0–5)
HCT: 37.1 % (ref 36.0–46.0)
Hemoglobin: 11.9 g/dL — ABNORMAL LOW (ref 12.0–15.0)
Lymphocytes Relative: 4 % — ABNORMAL LOW (ref 12–46)
Lymphs Abs: 0.5 10*3/uL — ABNORMAL LOW (ref 0.7–4.0)
MCV: 92.1 fL (ref 78.0–100.0)
Monocytes Absolute: 0.1 10*3/uL (ref 0.1–1.0)
Monocytes Relative: 1 % — ABNORMAL LOW (ref 3–12)
RBC: 4.03 MIL/uL (ref 3.87–5.11)
WBC: 12.8 10*3/uL — ABNORMAL HIGH (ref 4.0–10.5)

## 2012-10-09 LAB — URINE CULTURE
Colony Count: NO GROWTH
Culture: NO GROWTH

## 2012-10-09 LAB — TSH: TSH: 0.023 u[IU]/mL — ABNORMAL LOW (ref 0.350–4.500)

## 2012-10-09 LAB — GLUCOSE, CAPILLARY
Glucose-Capillary: 167 mg/dL — ABNORMAL HIGH (ref 70–99)
Glucose-Capillary: 143 mg/dL — ABNORMAL HIGH (ref 70–99)

## 2012-10-09 LAB — INFLUENZA PANEL BY PCR (TYPE A & B): Influenza A By PCR: NEGATIVE

## 2012-10-09 LAB — TROPONIN I
Troponin I: 1.83 ng/mL (ref <0.30)
Troponin I: 1.35 ng/mL (ref <0.30)

## 2012-10-09 MED ORDER — AMPICILLIN SODIUM 1 G IJ SOLR
1.0000 g | Freq: Four times a day (QID) | INTRAMUSCULAR | Status: DC
  Administered 2012-10-09 (×2): 1 g via INTRAVENOUS
  Filled 2012-10-09 (×3): qty 1000

## 2012-10-09 MED ORDER — SODIUM CHLORIDE 0.9 % IV SOLN
INTRAVENOUS | Status: DC | PRN
  Administered 2012-10-09: 19:00:00 via INTRAVENOUS

## 2012-10-09 MED ORDER — CEFTRIAXONE SODIUM 2 G IJ SOLR
2.0000 g | Freq: Two times a day (BID) | INTRAMUSCULAR | Status: DC
  Administered 2012-10-09 – 2012-10-12 (×7): 2 g via INTRAVENOUS
  Filled 2012-10-09 (×9): qty 2

## 2012-10-09 MED ORDER — ASPIRIN-DIPYRIDAMOLE ER 25-200 MG PO CP12
1.0000 | ORAL_CAPSULE | Freq: Two times a day (BID) | ORAL | Status: DC
  Administered 2012-10-09 – 2012-10-12 (×7): 1 via ORAL
  Filled 2012-10-09 (×8): qty 1

## 2012-10-09 MED ORDER — AMLODIPINE BESYLATE 5 MG PO TABS
5.0000 mg | ORAL_TABLET | Freq: Every day | ORAL | Status: DC
  Administered 2012-10-09 – 2012-10-10 (×2): 5 mg via ORAL
  Filled 2012-10-09 (×2): qty 1

## 2012-10-09 MED ORDER — PSYLLIUM 95 % PO PACK
1.0000 | PACK | Freq: Every day | ORAL | Status: DC
  Administered 2012-10-09: 1 via ORAL
  Filled 2012-10-09 (×4): qty 1

## 2012-10-09 MED ORDER — TORSEMIDE 20 MG PO TABS
60.0000 mg | ORAL_TABLET | Freq: Every day | ORAL | Status: DC
  Administered 2012-10-09: 60 mg via ORAL
  Filled 2012-10-09: qty 3

## 2012-10-09 MED ORDER — SIMVASTATIN 20 MG PO TABS
20.0000 mg | ORAL_TABLET | Freq: Every day | ORAL | Status: DC
  Administered 2012-10-09 – 2012-10-11 (×3): 20 mg via ORAL
  Filled 2012-10-09 (×4): qty 1

## 2012-10-09 MED ORDER — VANCOMYCIN HCL IN DEXTROSE 1-5 GM/200ML-% IV SOLN
1000.0000 mg | INTRAVENOUS | Status: DC
  Administered 2012-10-09: 1000 mg via INTRAVENOUS
  Filled 2012-10-09: qty 200

## 2012-10-09 MED ORDER — ALBUTEROL SULFATE (5 MG/ML) 0.5% IN NEBU
2.5000 mg | INHALATION_SOLUTION | Freq: Four times a day (QID) | RESPIRATORY_TRACT | Status: DC
  Administered 2012-10-09 (×4): 2.5 mg via RESPIRATORY_TRACT
  Filled 2012-10-09 (×4): qty 0.5

## 2012-10-09 MED ORDER — PANTOPRAZOLE SODIUM 40 MG IV SOLR
40.0000 mg | Freq: Every day | INTRAVENOUS | Status: DC
  Administered 2012-10-09: 40 mg via INTRAVENOUS
  Filled 2012-10-09 (×2): qty 40

## 2012-10-09 MED ORDER — DONEPEZIL HCL 10 MG PO TABS
10.0000 mg | ORAL_TABLET | Freq: Every day | ORAL | Status: DC
  Administered 2012-10-09 – 2012-10-11 (×3): 10 mg via ORAL
  Filled 2012-10-09 (×4): qty 1

## 2012-10-09 MED ORDER — BIOTENE DRY MOUTH MT LIQD
15.0000 mL | Freq: Four times a day (QID) | OROMUCOSAL | Status: DC
  Administered 2012-10-09 – 2012-10-11 (×8): 15 mL via OROMUCOSAL

## 2012-10-09 MED ORDER — HEPARIN SODIUM (PORCINE) 5000 UNIT/ML IJ SOLN
5000.0000 [IU] | Freq: Three times a day (TID) | INTRAMUSCULAR | Status: DC
  Administered 2012-10-09 – 2012-10-12 (×10): 5000 [IU] via SUBCUTANEOUS
  Filled 2012-10-09 (×12): qty 1

## 2012-10-09 MED ORDER — DEXAMETHASONE SODIUM PHOSPHATE 10 MG/ML IJ SOLN
6.0000 mg | Freq: Four times a day (QID) | INTRAMUSCULAR | Status: DC
  Administered 2012-10-09 – 2012-10-12 (×12): 6 mg via INTRAVENOUS
  Filled 2012-10-09 (×15): qty 0.6

## 2012-10-09 MED ORDER — ALBUTEROL SULFATE (5 MG/ML) 0.5% IN NEBU: 2.5000 mg | INHALATION_SOLUTION | RESPIRATORY_TRACT | Status: DC | PRN

## 2012-10-09 MED ORDER — ADULT MULTIVITAMIN W/MINERALS CH
1.0000 | ORAL_TABLET | Freq: Every day | ORAL | Status: DC
  Administered 2012-10-09 – 2012-10-12 (×4): 1 via ORAL
  Filled 2012-10-09 (×4): qty 1

## 2012-10-09 MED ORDER — CEFEPIME HCL 2 G IJ SOLR
2.0000 g | Freq: Two times a day (BID) | INTRAMUSCULAR | Status: DC
  Administered 2012-10-09: 2 g via INTRAVENOUS
  Filled 2012-10-09 (×2): qty 2

## 2012-10-09 MED ORDER — SODIUM CHLORIDE 0.9 % IV SOLN
2.0000 g | INTRAVENOUS | Status: DC
  Administered 2012-10-09 – 2012-10-12 (×19): 2 g via INTRAVENOUS
  Filled 2012-10-09 (×26): qty 2000

## 2012-10-09 MED ORDER — CALCIUM POLYCARBOPHIL 625 MG PO TABS: 625.0000 mg | ORAL_TABLET | Freq: Every day | ORAL | Status: DC

## 2012-10-09 MED ORDER — SERTRALINE HCL 50 MG PO TABS
50.0000 mg | ORAL_TABLET | Freq: Every day | ORAL | Status: DC
  Administered 2012-10-09 – 2012-10-12 (×4): 50 mg via ORAL
  Filled 2012-10-09 (×4): qty 1

## 2012-10-09 MED ORDER — SACCHAROMYCES BOULARDII 250 MG PO CAPS
250.0000 mg | ORAL_CAPSULE | Freq: Two times a day (BID) | ORAL | Status: DC
  Administered 2012-10-09 – 2012-10-12 (×7): 250 mg via ORAL
  Filled 2012-10-09 (×8): qty 1

## 2012-10-09 MED ORDER — VANCOMYCIN HCL 10 G IV SOLR
1250.0000 mg | INTRAVENOUS | Status: DC
  Administered 2012-10-10 – 2012-10-12 (×3): 1250 mg via INTRAVENOUS
  Filled 2012-10-09 (×6): qty 1250

## 2012-10-09 MED ORDER — DEXAMETHASONE SODIUM PHOSPHATE 10 MG/ML IJ SOLN
10.0000 mg | Freq: Four times a day (QID) | INTRAMUSCULAR | Status: DC
  Administered 2012-10-09 (×2): 10 mg via INTRAVENOUS
  Filled 2012-10-09 (×6): qty 1

## 2012-10-09 MED ORDER — HYDRALAZINE HCL 20 MG/ML IJ SOLN
10.0000 mg | Freq: Four times a day (QID) | INTRAMUSCULAR | Status: DC | PRN
  Filled 2012-10-09: qty 0.5

## 2012-10-09 MED ORDER — ACETAMINOPHEN 650 MG RE SUPP: 650.0000 mg | Freq: Four times a day (QID) | RECTAL | Status: DC | PRN

## 2012-10-09 MED ORDER — INSULIN ASPART 100 UNIT/ML ~~LOC~~ SOLN
0.0000 [IU] | Freq: Three times a day (TID) | SUBCUTANEOUS | Status: DC
  Administered 2012-10-09: 2 [IU] via SUBCUTANEOUS
  Administered 2012-10-09 – 2012-10-10 (×3): 3 [IU] via SUBCUTANEOUS
  Administered 2012-10-10 – 2012-10-11 (×2): 2 [IU] via SUBCUTANEOUS
  Administered 2012-10-11 – 2012-10-12 (×2): 3 [IU] via SUBCUTANEOUS
  Administered 2012-10-12 (×2): 2 [IU] via SUBCUTANEOUS

## 2012-10-09 MED ORDER — SODIUM CHLORIDE 0.9 % IV SOLN
INTRAVENOUS | Status: DC
  Administered 2012-10-09: 04:00:00 via INTRAVENOUS

## 2012-10-09 MED ORDER — LEVOTHYROXINE SODIUM 175 MCG PO TABS
175.0000 ug | ORAL_TABLET | Freq: Every day | ORAL | Status: DC
  Administered 2012-10-09 – 2012-10-10 (×2): 175 ug via ORAL
  Filled 2012-10-09 (×4): qty 1

## 2012-10-09 MED ORDER — SODIUM CHLORIDE 0.9 % IJ SOLN
3.0000 mL | Freq: Two times a day (BID) | INTRAMUSCULAR | Status: DC
  Administered 2012-10-09 – 2012-10-11 (×4): 3 mL via INTRAVENOUS

## 2012-10-09 MED ORDER — DEXAMETHASONE SODIUM PHOSPHATE 4 MG/ML IJ SOLN
10.0000 mg | INTRAMUSCULAR | Status: AC
  Administered 2012-10-09: 10 mg via INTRAVENOUS
  Filled 2012-10-09: qty 3

## 2012-10-09 NOTE — ED Provider Notes (Signed)
Medical screening examination/treatment/procedure(s) were conducted as a shared visit with non-physician practitioner(s) and myself.  I personally evaluated the patient during the encounter.  Patient seen and evaluated with Dr. Gary Fleet. Evaluation reveals evidence of sepsis and possible meningitis. After appropriate workup, arrangements were made for the patient to be admitted for further treatment.  Gilda Crease, MD 10/09/12 [phone]

## 2012-10-09 NOTE — Consult Note (Signed)
INFECTIOUS DISEASE CONSULT NOTE  Date of Admission:  10/08/2012  Date of Consult:  10/09/2012  Reason for Consult: meningitis  Referring Physician: Butler Denmark  Impression/Recommendation Meningitis  Would- restart ceftriaxone, continue vanco and ampicillin. Continue decadron Stop cefepime Await her CSF Cx  Comment- There are a high number pathogens which are possible for this patient. Her high CSF white blood cell count raises the suspicion of meningitis. Given her age, her previous prednisone use, she is certainly at risk for listeria. THis typically has a high lymphocyte percent (which she does not have) than is not typical for bacterial pathogens. Certainly Strep pneumo would be very high on the differential as well. Per her daughter she had no previous exposure to antibiotics. We'll await her cultures. My suspicion she will amount of isolation within 48 hours however will defer this to the infection prevention team.  Thank you so much for this interesting consult,   [name] [phone]  [name] is an 79 y.o. female. (hx per her daughter) HPI: 64 F with hx of TIA, chronic UTI/incontence, temporal arteritis on chronic prednisone. Beginning on the morning of January 13 she began to feel poorly. She was evaluated by her physician at her skilled nursing facility. He began a workup focused on  her chronic incontinence, to get a urine culture as well as a chest x-ray. Her daughter came to see her within 2 hours and by that time she was unresponsive. She was taken via EMS to the hospital.  In route she had a fingerstick done which caused her to have seizure. By the time she came to the emergency room showed a temperature of 102. She should not have a white blood cell count 23,000. She had a lumbar puncture to her change in mental status which showed 480 white blood cells 23 red blood cells 89% neutrophils. Her glucose was 82 and her protein was 50. Her Gram stain was negative. She appears  to been started on cefepime as well as given one dose of ceftriaxone. She was also given ampicillin and vancomycin. He was started on Decadron as well.  The patient feels much better according to her daughter she is much more alert and interactive.    Past Medical History  Diagnosis Date  . Peripheral neuropathy   . Hypertension   . TIA (transient ischemic attack)   . Elevated sed rate   . Temporal arteritis     chronic steroids  . Hypothyroidism   . Colon cancer     colon  . History of hernia repair   . S/P rotator cuff surgery     right  . Diastolic CHF   . Cardiomegaly - hypertensive   . Chronic anemia   . Chronic steroid use   . Dementia   . Shortness of breath   . Hyperlipidemia   . CHF (congestive heart failure)   . Cardiomyopathy due to hypertension   . Dependence on supplemental oxygen   . Chronic UTI   . Dementia     Past Surgical History  Procedure Date  . Tonsillectomy   . Appendectomy   . Abdominal hysterectomy   . Colectomy      Allergies  Allergen Reactions  . Apap (Acetaminophen)     Unknown  . Ciprofloxacin Nausea And Vomiting  . Codeine Other (See Comments)    halucinations  . Demerol Other (See Comments)    halucinations  . Morphine And Related Other (See Comments)    halucinations  .  Nitrofurantoin Monohyd Macro Other (See Comments)    Per MAR  . Pregabalin Other (See Comments)    unknown  . Septra (Bactrim) Other (See Comments)    Unknown cant tolerate DS    Medications:  Scheduled:   . albuterol  2.5 mg Nebulization Q6H  . amLODipine  5 mg Oral Daily  . ampicillin (OMNIPEN) IV  1 g Intravenous Q6H  . ceFEPime (MAXIPIME) IV  2 g Intravenous BID  . dexamethasone  10 mg Intravenous Q6H  . dipyridamole-aspirin  1 capsule Oral BID  . donepezil  10 mg Oral QHS  . heparin  5,000 Units Subcutaneous Q8H  . insulin aspart  0-15 Units Subcutaneous TID WC  . levothyroxine  175 mcg Oral QAC breakfast  . multivitamin with minerals  1  tablet Oral Daily  . pantoprazole (PROTONIX) IV  40 mg Intravenous QHS  . psyllium  1 packet Oral Daily  . saccharomyces boulardii  250 mg Oral BID  . sertraline  50 mg Oral Daily  . simvastatin  20 mg Oral QHS  . sodium chloride  3 mL Intravenous Q12H  . torsemide  60 mg Oral Daily  . vancomycin  1,250 mg Intravenous Q24H    Total days of antibiotics vanco/amp/ceftraixone/decadron          Social History:  reports that she has never smoked. She does not have any smokeless tobacco history on file. She reports that she does not drink alcohol or use illicit drugs.  History reviewed. No pertinent family history.  General ROS: She denies photophobia. She states she has had a headache. She denies dysphagia. She has chronic urinary incontinence. She has not had diarrhea. She isn't have a sick contacts in the nursing facility. See history of present illness.  Blood pressure 116/55, pulse 85, temperature 99.5 F (37.5 C), temperature source Oral, resp. rate 24, height 5\' 9"  (1.753 m), weight 89.4 kg (197 lb 1.5 oz), SpO2 100.00%. General appearance: alert, cooperative and no distress Eyes: negative findings: pupils equal, round, reactive to light and accomodation and no photophobia Throat: normal findings: oropharynx pink & moist without lesions or evidence of thrush Neck: no adenopathy, supple, symmetrical, trachea midline and Mild tenderness with anterior flexion. Lungs: clear to auscultation bilaterally Heart: regular rate and rhythm Abdomen: normal findings: bowel sounds normal and soft, non-tender Extremities: edema trace Skin: She is a rash on her face her daughter states this is unchanged. She has multiple areas of ecchymosis on her upper extremities. This also is not new and is due to her being on Coumadin previously. She has no worsening rashes. Neuro exam: 5/5 grip strength, coordination she was grossly normal in her UE. Her light touch on extremities is grossly normal as well  .   Results for orders placed during the hospital encounter of 10/08/12 (from the past 48 hour(s))  CBC WITH DIFFERENTIAL     Status: Abnormal   Collection Time   10/08/12  5:23 PM      Component Value Range Comment   WBC 23.8 (*) 4.0 - 10.5 K/uL    RBC 4.04  3.87 - 5.11 MIL/uL    Hemoglobin 12.5  12.0 - 15.0 g/dL    HCT 16.1  09.6 - 04.5 %    MCV 111.4 (*) 78.0 - 100.0 fL    MCH 30.9  26.0 - 34.0 pg    MCHC 27.8 (*) 30.0 - 36.0 g/dL    RDW 40.9  81.1 - 91.4 %  Platelets 231  150 - 400 K/uL    Neutrophils Relative 50  43 - 77 %    Lymphocytes Relative 46  12 - 46 %    Monocytes Relative 4  3 - 12 %    Eosinophils Relative 0  0 - 5 %    Basophils Relative 0  0 - 1 %    Neutro Abs 11.9 (*) 1.7 - 7.7 K/uL    Lymphs Abs 10.9 (*) 0.7 - 4.0 K/uL    Monocytes Absolute 1.0  0.1 - 1.0 K/uL    Eosinophils Absolute 0.0  0.0 - 0.7 K/uL    Basophils Absolute 0.0  0.0 - 0.1 K/uL    WBC Morphology ATYPICAL LYMPHOCYTES     COMPREHENSIVE METABOLIC PANEL     Status: Abnormal   Collection Time   10/08/12  5:23 PM      Component Value Range Comment   Sodium 136  135 - 145 mEq/L    Potassium 3.9  3.5 - 5.1 mEq/L    Chloride 91 (*) 96 - 112 mEq/L    CO2 21  19 - 32 mEq/L    Glucose, Bld 241 (*) 70 - 99 mg/dL    BUN 15  6 - 23 mg/dL    Creatinine, Ser 1.61 (*) 0.50 - 1.10 mg/dL    Calcium 9.5  8.4 - 09.6 mg/dL    Total Protein 7.5  6.0 - 8.3 g/dL    Albumin 3.2 (*) 3.5 - 5.2 g/dL    AST 30  0 - 37 U/L    ALT 21  0 - 35 U/L    Alkaline Phosphatase 85  39 - 117 U/L    Total Bilirubin 0.2 (*) 0.3 - 1.2 mg/dL    GFR calc non Af Amer 30 (*) >90 mL/min    GFR calc Af Amer 35 (*) >90 mL/min   PRO B NATRIURETIC PEPTIDE     Status: Abnormal   Collection Time   10/08/12  5:23 PM      Component Value Range Comment   Pro B Natriuretic peptide (BNP) 1356.0 (*) 0 - 450 pg/mL   PROTIME-INR     Status: Normal   Collection Time   10/08/12  5:23 PM      Component Value Range Comment   Prothrombin Time  13.5  11.6 - 15.2 seconds    INR 1.04  0.00 - 1.49   PATHOLOGIST SMEAR REVIEW     Status: Normal   Collection Time   10/08/12  5:23 PM      Component Value Range Comment   Path Review            POCT I-STAT 3, BLOOD GAS (G3+)     Status: Abnormal   Collection Time   10/08/12  5:46 PM      Component Value Range Comment   pH, Arterial 7.305 (*) 7.350 - 7.450    pCO2 arterial 45.7 (*) 35.0 - 45.0 mmHg    pO2, Arterial 58.0 (*) 80.0 - 100.0 mmHg    Bicarbonate 22.8  20.0 - 24.0 mEq/L    TCO2 24  0 - 100 mmol/L    O2 Saturation 87.0      Acid-base deficit 4.0 (*) 0.0 - 2.0 mmol/L    Patient temperature 98.6 F      Collection site RADIAL, ALLEN'S TEST ACCEPTABLE      Drawn by RT      Sample type ARTERIAL     URINALYSIS,  ROUTINE W REFLEX MICROSCOPIC     Status: Abnormal   Collection Time   10/08/12  5:47 PM      Component Value Range Comment   Color, Urine YELLOW  YELLOW    APPearance CLEAR  CLEAR    Specific Gravity, Urine 1.010  1.005 - 1.030    pH 7.5  5.0 - 8.0    Glucose, UA NEGATIVE  NEGATIVE mg/dL    Hgb urine dipstick MODERATE (*) NEGATIVE    Bilirubin Urine NEGATIVE  NEGATIVE    Ketones, ur NEGATIVE  NEGATIVE mg/dL    Protein, ur 161 (*) NEGATIVE mg/dL    Urobilinogen, UA 0.2  0.0 - 1.0 mg/dL    Nitrite NEGATIVE  NEGATIVE    Leukocytes, UA NEGATIVE  NEGATIVE   URINE MICROSCOPIC-ADD ON     Status: Abnormal   Collection Time   10/08/12  5:47 PM      Component Value Range Comment   Squamous Epithelial / LPF RARE  RARE    WBC, UA 0-2  <3 WBC/hpf    RBC / HPF 0-2  <3 RBC/hpf    Bacteria, UA RARE  RARE    Casts HYALINE CASTS (*) NEGATIVE    Urine-Other MUCOUS PRESENT     POCT I-STAT TROPONIN I     Status: Abnormal   Collection Time   10/08/12  8:11 PM      Component Value Range Comment   Troponin i, poc 1.06 (*) 0.00 - 0.08 ng/mL    Comment NOTIFIED PHYSICIAN      Comment 3            CG4 I-STAT (LACTIC ACID)     Status: Abnormal   Collection Time   10/08/12  8:13 PM       Component Value Range Comment   Lactic Acid, Venous 5.53 (*) 0.5 - 2.2 mmol/L   LACTIC ACID, PLASMA     Status: Abnormal   Collection Time   10/08/12  9:57 PM      Component Value Range Comment   Lactic Acid, Venous 3.2 (*) 0.5 - 2.2 mmol/L   CSF CULTURE     Status: Normal (Preliminary result)   Collection Time   10/08/12  9:58 PM      Component Value Range Comment   Specimen Description CSF      Special Requests NONE      Gram Stain        Value: WBC PRESENT,BOTH PMN AND MONONUCLEAR     NO ORGANISMS SEEN     CYTOSPIN SLIDE Performed at [facility]   Culture PENDING      Report Status PENDING     GLUCOSE, CSF     Status: Abnormal   Collection Time   10/08/12  9:58 PM      Component Value Range Comment   Glucose, CSF 82 (*) 43 - 76 mg/dL   PROTEIN, CSF     Status: Abnormal   Collection Time   10/08/12  9:58 PM      Component Value Range Comment   Total  Protein, CSF 50 (*) 15 - 45 mg/dL   CSF CELL COUNT WITH DIFFERENTIAL     Status: Abnormal   Collection Time   10/08/12  9:58 PM      Component Value Range Comment   Tube # tube 1      Color, CSF COLORLESS  COLORLESS    Appearance, CSF HAZY (*) CLEAR    Supernatant NOT  INDICATED      RBC Count, CSF 23 (*) 0 /cu mm    WBC, CSF 480 (*) 0 - 5 /cu mm    Segmented Neutrophils-CSF 89 (*) 0 - 6 %    Lymphs, CSF 5 (*) 40 - 80 %    Monocyte-Macrophage-Spinal Fluid 6 (*) 15 - 45 %    Eosinophils, CSF 0  0 - 1 %   CSF CELL COUNT WITH DIFFERENTIAL     Status: Abnormal   Collection Time   10/08/12  9:58 PM      Component Value Range Comment   Tube # tube 4      Color, CSF COLORLESS  COLORLESS    Appearance, CSF CLOUDY (*) CLEAR    Supernatant NOT INDICATED      RBC Count, CSF 2550 (*) 0 /cu mm    WBC, CSF 455 (*) 0 - 5 /cu mm    Segmented Neutrophils-CSF 93 (*) 0 - 6 %    Lymphs, CSF 2 (*) 40 - 80 %    Monocyte-Macrophage-Spinal Fluid 5 (*) 15 - 45 %    Eosinophils, CSF 0  0 - 1 %   GRAM STAIN     Status: Normal    Collection Time   10/08/12  9:58 PM      Component Value Range Comment   Specimen Description CSF      Special Requests NO 2 1CC      Gram Stain        Value: CYTOSPIN SLIDE     WBC PRESENT,BOTH PMN AND MONONUCLEAR     NO ORGANISMS SEEN   Report Status 10/08/2012 FINAL     TROPONIN I     Status: Abnormal   Collection Time   10/08/12  9:58 PM      Component Value Range Comment   Troponin I 1.18 (*) <0.30 ng/mL   PATHOLOGIST SMEAR REVIEW     Status: Normal   Collection Time   10/08/12  9:58 PM      Component Value Range Comment   Path Review BOTH TUBES WITH ABUNDANT PMNs     POCT I-STAT TROPONIN I     Status: Abnormal   Collection Time   10/08/12 11:26 PM      Component Value Range Comment   Troponin i, poc 1.63 (*) 0.00 - 0.08 ng/mL    Comment NOTIFIED PHYSICIAN      Comment 3            CG4 I-STAT (LACTIC ACID)     Status: Normal   Collection Time   10/08/12 11:28 PM      Component Value Range Comment   Lactic Acid, Venous 2.02  0.5 - 2.2 mmol/L   MRSA PCR SCREENING     Status: Normal   Collection Time   10/09/12  4:22 AM      Component Value Range Comment   MRSA by PCR NEGATIVE  NEGATIVE   TSH     Status: Abnormal   Collection Time   10/09/12  4:55 AM      Component Value Range Comment   TSH 0.023 (*) 0.350 - 4.500 uIU/mL   COMPREHENSIVE METABOLIC PANEL     Status: Abnormal   Collection Time   10/09/12  4:55 AM      Component Value Range Comment   Sodium 138  135 - 145 mEq/L    Potassium 3.9  3.5 - 5.1 mEq/L    Chloride 99  96 -  112 mEq/L    CO2 28  19 - 32 mEq/L    Glucose, Bld 149 (*) 70 - 99 mg/dL    BUN 15  6 - 23 mg/dL    Creatinine, Ser 1.61 (*) 0.50 - 1.10 mg/dL    Calcium 8.8  8.4 - 09.6 mg/dL    Total Protein 6.7  6.0 - 8.3 g/dL    Albumin 2.7 (*) 3.5 - 5.2 g/dL    AST 40 (*) 0 - 37 U/L    ALT 22  0 - 35 U/L    Alkaline Phosphatase 71  39 - 117 U/L    Total Bilirubin 0.3  0.3 - 1.2 mg/dL    GFR calc non Af Amer 35 (*) >90 mL/min    GFR calc Af Amer 40  (*) >90 mL/min   TROPONIN I     Status: Abnormal   Collection Time   10/09/12  5:00 AM      Component Value Range Comment   Troponin I 2.33 (*) <0.30 ng/mL   CBC WITH DIFFERENTIAL     Status: Abnormal   Collection Time   10/09/12  7:00 AM      Component Value Range Comment   WBC 12.8 (*) 4.0 - 10.5 K/uL    RBC 4.03  3.87 - 5.11 MIL/uL    Hemoglobin 11.9 (*) 12.0 - 15.0 g/dL    HCT 04.5  40.9 - 81.1 %    MCV 92.1  78.0 - 100.0 fL    MCH 29.5  26.0 - 34.0 pg    MCHC 32.1  30.0 - 36.0 g/dL    RDW 91.4  78.2 - 95.6 %    Platelets 205  150 - 400 K/uL    Neutrophils Relative 96 (*) 43 - 77 %    Neutro Abs 12.2 (*) 1.7 - 7.7 K/uL    Lymphocytes Relative 4 (*) 12 - 46 %    Lymphs Abs 0.5 (*) 0.7 - 4.0 K/uL    Monocytes Relative 1 (*) 3 - 12 %    Monocytes Absolute 0.1  0.1 - 1.0 K/uL    Eosinophils Relative 0  0 - 5 %    Eosinophils Absolute 0.0  0.0 - 0.7 K/uL    Basophils Relative 0  0 - 1 %    Basophils Absolute 0.0  0.0 - 0.1 K/uL   GLUCOSE, CAPILLARY     Status: Abnormal   Collection Time   10/09/12  7:50 AM      Component Value Range Comment   Glucose-Capillary 171 (*) 70 - 99 mg/dL   TROPONIN I     Status: Abnormal   Collection Time   10/09/12 10:03 AM      Component Value Range Comment   Troponin I 1.83 (*) <0.30 ng/mL       Component Value Date/Time   SDES CSF 10/08/2012 2158   SDES CSF 10/08/2012 2158   SPECREQUEST NONE 10/08/2012 2158   SPECREQUEST NO 2 1CC 10/08/2012 2158   CULT PENDING 10/08/2012 2158   REPTSTATUS PENDING 10/08/2012 2158   REPTSTATUS 10/08/2012 FINAL 10/08/2012 2158   Ct Head Wo Contrast  10/08/2012  *RADIOLOGY REPORT*  Clinical Data: Fever, unresponsive.  CT HEAD WITHOUT CONTRAST  Technique:  Contiguous axial images were obtained from the base of the skull through the vertex without contrast.  Comparison: 05/14/2012  Findings: Atherosclerotic and physiologic intracranial calcifications.  Motion degraded many images, requiring rescanning. Diffuse  parenchymal atrophy. Patchy areas of hypoattenuation  in deep and periventricular white matter bilaterally.  Prominence of lateral and third ventricles as before.  Negative for acute intracranial hemorrhage, mass lesion, acute infarction, midline shift, or mass-effect. Acute infarct may be inapparent on noncontrast CT. Ventricles and sulci symmetric. Bone windows demonstrate no focal lesion.  IMPRESSION:  1. Negative for bleed or other acute intracranial process.  2. Atrophy and nonspecific white matter changes.   Original Report Authenticated By: D. Andria Rhein, MD    Dg Chest Portable 1 View  10/08/2012  *RADIOLOGY REPORT*  Clinical Data: CPR, fever.  PORTABLE CHEST - 1 VIEW  Comparison: 09/02/2012  Findings: Stable cardiomegaly.  Atheromatous aorta.  Some increase in mild interstitial edema and pulmonary vascular congestion. Persistent blunting of the left lateral costophrenic angle. Regional bones unremarkable.  IMPRESSION:  1.  Stable cardiomegaly with some increase in mild interstitial edema.   Original Report Authenticated By: D. Andria Rhein, MD    Recent Results (from the past 240 hour(s))  CSF CULTURE     Status: Normal (Preliminary result)   Collection Time   10/08/12  9:58 PM      Component Value Range Status Comment   Specimen Description CSF   Final    Special Requests NONE   Final    Gram Stain     Final    Value: WBC PRESENT,BOTH PMN AND MONONUCLEAR     NO ORGANISMS SEEN     CYTOSPIN SLIDE Performed at [facility]   Culture PENDING   Incomplete    Report Status PENDING   Incomplete   GRAM STAIN     Status: Normal   Collection Time   10/08/12  9:58 PM      Component Value Range Status Comment   Specimen Description CSF   Final    Special Requests NO 2 1CC   Final    Gram Stain     Final    Value: CYTOSPIN SLIDE     WBC PRESENT,BOTH PMN AND MONONUCLEAR     NO ORGANISMS SEEN   Report Status 10/08/2012 FINAL   Final   MRSA PCR SCREENING     Status: Normal   Collection  Time   10/09/12  4:22 AM      Component Value Range Status Comment   MRSA by PCR NEGATIVE  NEGATIVE Final       10/09/2012, 11:44 AM     LOS: 1 day

## 2012-10-09 NOTE — H&P (Signed)
[facility] History and Physical  [name]:[id number] DOB: March 03, 1930 DOA: 10/08/2012  Referring physician: ED PCP: Cain Saupe, MD   Chief Complaint:  Brought in by family for acute change in mental status  HPI:  79 year old female resident of assisted living with history of chronic diastole CHF on supplemental 02, temporal arteritis on chronic steroids, hypothyroidism, mild dementia, with recent hospitalization for her acute on chronic respiratory failure with? Pneumonia and was brought in by family after she was found to have acute change in mental status didn't yesterday morning. Vision was noted to be very confused and not following any commands. EMS arrived and noted to be febrile at 10 33F and also noted some seizure-like activity (with a short duration of generalized tonic-clonic seizure as per the ED) her GCS was 3 on arrival to the ED and required BiPAP for acute respiratory distress. She was tachycardic and hypoxic with sats in 60s and cyanotic on arrival. Given symptoms of severe sepsis workup was initiated including head CT , EKG and labs. Blood work showed significant leukocytosis of 23,000 with an elevated lactate of 5.3 and an elevated troponin greater than 1. Given concern for meningitis and LP was done in the ED which showed significant WBC elevated protein and glucose. She was given a dose of IV vancomycin Zosyn and ceftriaxone along with 500 cc normal saline. Given CSF findings, a dose of IV ampicillin was also given. Her mental status did improve with GCS of 14 and weaned off BiPAP and placed on nonrebreather. A repeat lactic acid improved to 2. Her EKG showed right bundle branch block with diffuse T-wave inversion. Patient is oriented to place and person on evaluation however is quite lethargic. Is not able to provide much history but does complain of headache and neck pain.  Of note , patient was admitted to the hospital 1 months back for acute respiratory  distress and? Pneumonia. Patient on questioning denies any symptoms of nausea, vomiting, chest pain, palpitations, dizziness, abdominal pain, bowel or urinary symptoms. Denies any weakness. She is on nonrebreather on my evaluation.  She seemed was consulted given severe sepsis however patient was somewhat improved clinically and since she's a DO NOT RESUSCITATE/DO NOT INTUBATE recommended appropriate for management in step down.   Review of Systems:  As outlined in history of present illness above. Patient although awake and alert to commands is lethargic and not able to provide a detailed history.   Past Medical History  Diagnosis Date  . Peripheral neuropathy   . Hypertension   . TIA (transient ischemic attack)   . Elevated sed rate   . Temporal arteritis     chronic steroids  . Hypothyroidism   . Colon cancer     colon  . History of hernia repair   . S/P rotator cuff surgery     right  . Diastolic CHF   . Cardiomegaly - hypertensive   . Chronic anemia   . Chronic steroid use   . Dementia   . Shortness of breath   . Hyperlipidemia   . CHF (congestive heart failure)   . Cardiomyopathy due to hypertension   . Dependence on supplemental oxygen   . Chronic UTI   . Dementia    Past Surgical History  Procedure Date  . Tonsillectomy   . Appendectomy   . Abdominal hysterectomy   . Colectomy    Social History:  reports that she has never smoked. She does not have any smokeless tobacco history  on file. She reports that she does not drink alcohol or use illicit drugs.  Allergies  Allergen Reactions  . Apap (Acetaminophen)     Unknown  . Ciprofloxacin Nausea And Vomiting  . Codeine Other (See Comments)    halucinations  . Demerol Other (See Comments)    halucinations  . Morphine And Related Other (See Comments)    halucinations  . Nitrofurantoin Monohyd Macro Other (See Comments)    Per MAR  . Pregabalin Other (See Comments)    unknown  . Septra (Bactrim) Other (See  Comments)    Unknown cant tolerate DS    History reviewed. No pertinent family history.  Prior to Admission medications   Medication Sig Start Date End Date Taking? Authorizing Provider  acetaminophen (TYLENOL) 325 MG tablet Take 650 mg by mouth every 6 (six) hours as needed. For pain   Yes Historical Provider, MD  albuterol (PROVENTIL) (2.5 MG/3ML) 0.083% nebulizer solution Take 2.5 mg by nebulization every 4 (four) hours as needed. For shortness of breath   Yes Historical Provider, MD  amLODipine (NORVASC) 5 MG tablet Take 5 mg by mouth daily.   Yes Historical Provider, MD  Cranberry 200 MG CAPS Take 200 mg by mouth 2 (two) times daily.   Yes Historical Provider, MD  dipyridamole-aspirin (AGGRENOX) 25-200 MG per 12 hr capsule Take 1 capsule by mouth 2 (two) times daily.     Yes Historical Provider, MD  donepezil (ARICEPT) 10 MG tablet Take 10 mg by mouth at bedtime.    Yes Historical Provider, MD  ipratropium (ATROVENT) 0.02 % nebulizer solution Take 2.5 mLs (0.5 mg total) by nebulization every 4 (four) hours as needed. 09/07/12  Yes Rhetta Mura, MD  levothyroxine (SYNTHROID, LEVOTHROID) 175 MCG tablet Take 175 mcg by mouth every morning.   Yes Historical Provider, MD  loperamide (IMODIUM) 2 MG capsule Take 1 capsule (2 mg total) by mouth as needed for diarrhea or loose stools. 09/07/12  Yes Rhetta Mura, MD  Multiple Vitamin (MULITIVITAMIN WITH MINERALS) TABS Take 1 tablet by mouth daily.     Yes Historical Provider, MD  omeprazole (PRILOSEC) 20 MG capsule Take 20 mg by mouth daily.     Yes Historical Provider, MD  [name] (OYSTER CALCIUM) 500 MG TABS Take 500 mg of elemental calcium by mouth 2 (two) times daily.   Yes Historical Provider, MD  polycarbophil (FIBERCON) 625 MG tablet Take 625 mg by mouth daily.   Yes Historical Provider, MD  potassium chloride SA (K-DUR,KLOR-CON) 20 MEQ tablet Take 20 mEq by mouth 2 (two) times daily.   Yes Historical Provider, MD    predniSONE (DELTASONE) 5 MG tablet Take 10-15 mg by mouth daily. Alternate 10mg  and 15 mg doses   Yes Historical Provider, MD  saccharomyces boulardii (FLORASTOR) 250 MG capsule Take 250 mg by mouth 2 (two) times daily.   Yes Historical Provider, MD  sertraline (ZOLOFT) 50 MG tablet Take 50 mg by mouth daily.     Yes Historical Provider, MD  simvastatin (ZOCOR) 20 MG tablet Take 20 mg by mouth at bedtime.     Yes Historical Provider, MD  sulfamethoxazole-trimethoprim (BACTRIM,SEPTRA) 400-80 MG per tablet Take 1 tablet by mouth at bedtime.   Yes Historical Provider, MD  torsemide (DEMADEX) 20 MG tablet Take 60 mg by mouth daily.   Yes Historical Provider, MD    Physical Exam:  Filed Vitals:   10/08/12 2130 10/08/12 2200 10/08/12 2230 10/08/12 2300  BP: 129/64 155/63 172/60 165/55  Pulse: 103 88 88 80  Temp: 99.1 F (37.3 C) 99.7 F (37.6 C) 99.9 F (37.7 C) 99.9 F (37.7 C)  TempSrc: Core (Comment)     Resp: 18 11 23 21   Weight:      SpO2: 100% 99% 100% 98%    Constitutional: Vital signs reviewed.  Patient is a well-developed and well-nourished female on a nonrebreather.  Head: Normocephalic and atraumatic Ear: TM normal bilaterally Mouth:  MMM Eyes: PERRL, EOMI, conjunctivae normal, No scleral icterus.  Neck: Complains of discomfort on neck movement with rigidity. Trachea midline normal ROM, No JVD, mass, thyromegaly, or carotid bruit present.  Cardiovascular: RRR, S1 normal, S2 normal, no MRG, pulses symmetric and intact bilaterally Pulmonary/Chest: Diminished breath sounds bilaterally, no crackles or wheezes noted. Abdominal: Soft. Non-tender, non-distended, bowel sounds are normal, no masses, organomegaly, or guarding present.  GU: no CVA tenderness Musculoskeletal: No joint deformities, erythema, or stiffness, ROM full and no nontender Ext: no edema and no cyanosis, pulses palpable bilaterally (DP and PT), Foley in place Hematology: no cervical, inginal, or axillary  adenopathy.  Neurological: She is alert awake and able to answer questions, oriented to place and person  but does get sleepy frequently during conversation. He is oriented to place and person cannot carry out a conversation for long. Pupils equal and reactive bilaterally. Cranial nerves exam limited. Is able to move all her extremities and seems to have good strength. Has neck rigidity.  Skin: Warm, dry and intact. No rash, cyanosis, or clubbing.  Psychiatric: Normal mood and affect. speech and behavior is normal. Judgment and thought content normal. Cognition and memory are normal.   Labs on Admission:  Basic Metabolic Panel:  Lab 10/08/12 2956  NA 136  K 3.9  CL 91*  CO2 21  GLUCOSE 241*  BUN 15  CREATININE 1.56*  CALCIUM 9.5  MG --  PHOS --   Liver Function Tests:  Lab 10/08/12 1723  AST 30  ALT 21  ALKPHOS 85  BILITOT 0.2*  PROT 7.5  ALBUMIN 3.2*   No results found for this basename: LIPASE:5,AMYLASE:5 in the last 168 hours No results found for this basename: AMMONIA:5 in the last 168 hours CBC:  Lab 10/08/12 1723  WBC 23.8*  NEUTROABS 11.9*  HGB 12.5  HCT 45.0  MCV 111.4*  PLT 231   Cardiac Enzymes:  Lab 10/08/12 2158  CKTOTAL --  CKMB --  CKMBINDEX --  TROPONINI 1.18*   BNP: No components found with this basename: POCBNP:5 CBG: No results found for this basename: GLUCAP:5 in the last 168 hours  Radiological Exams on Admission: Ct Head Wo Contrast  10/08/2012  *RADIOLOGY REPORT*  Clinical Data: Fever, unresponsive.  CT HEAD WITHOUT CONTRAST  Technique:  Contiguous axial images were obtained from the base of the skull through the vertex without contrast.  Comparison: 05/14/2012  Findings: Atherosclerotic and physiologic intracranial calcifications.  Motion degraded many images, requiring rescanning. Diffuse parenchymal atrophy. Patchy areas of hypoattenuation in deep and periventricular white matter bilaterally.  Prominence of lateral and third ventricles  as before.  Negative for acute intracranial hemorrhage, mass lesion, acute infarction, midline shift, or mass-effect. Acute infarct may be inapparent on noncontrast CT. Ventricles and sulci symmetric. Bone windows demonstrate no focal lesion.  IMPRESSION:  1. Negative for bleed or other acute intracranial process.  2. Atrophy and nonspecific white matter changes.   Original Report Authenticated By: D. Andria Rhein, MD    Dg Chest Portable 1 View  10/08/2012  *  RADIOLOGY REPORT*  Clinical Data: CPR, fever.  PORTABLE CHEST - 1 VIEW  Comparison: 09/02/2012  Findings: Stable cardiomegaly.  Atheromatous aorta.  Some increase in mild interstitial edema and pulmonary vascular congestion. Persistent blunting of the left lateral costophrenic angle. Regional bones unremarkable.  IMPRESSION:  1.  Stable cardiomegaly with some increase in mild interstitial edema.   Original Report Authenticated By: D. Andria Rhein, MD     EKG: Sinus Tachycardia at 103 with some wide-complex tachycardia.   Assessment/Plan Principal Problem:  *Severe sepsis Admitted to step down for close monitoring.  If in the setting of underlying bacterial meningitis as evident on lumbar puncture. -She received a dose of IV vancomycin, IV Zosyn, ceftriaxone and ampicillin in the ED. I will broadly cover her with IV vancomycin, IV cefepime and ampicillin. I have given her a dose of IV Decadron 10 mg I will place her on IV Decadron 10 mg every 6 hours. Continue PPI for GI prophylaxis and sliding scale coverage. -Monitor neurochecks. Monitor closely for any seizure activity. As per EMS patient was noted to have some seizure like activity on their arrival. This could be possibly related to her underlying infection. I will order for an EEG. -Follow blood culture and CSF culture. Check for influenza. Continue with Droplet precaution. -Monitor for any seizure activity    Active Problems:  Acute respiratory failure  related to severe sepsis. Continue  on nonrebreather. Will titrate as improved. Will place her on nebs once improved.    Demand ischemia of myocardium Patient has significant troponin leak on admission. She denies any chest pain and had nonspecific EKG findings. Cardiology consult has been called from the ED and will follow up with recommendation. Monitor serial troponins.   Altered mental status In the setting of severe sepsis and is improving. Continue with neuro checks   Hypothyroidism Continue Daily Synthroid. Monitor TSH in the morning   Diastolic CHF monitor I/O. We'll provide gentle hydration   Temporal arteritis Chronically on by mouth prednisone. Currently on Decadron and is being held.   Hypertension Pressure stable. Will order when necessary hydralazine   ARF (acute renal failure) Likely in the setting of sepsis. UA unremarkable. Monitor   Code Status: DO NOT RESUSCITATE/DO NOT INTUBATE. Discussed plan with the daughter at bedside  Disposition Plan: Currently inpatient. Will monitor  Eddie North [facility] Pager [phone]  If 7PM-7AM, please contact night-coverage [email or web address] Password [facility] 10/09/2012, 12:37 AM   total time spent: 70 minutes

## 2012-10-09 NOTE — ED Notes (Signed)
Abnormal result troponin 1.18 called by lab and reported to provider

## 2012-10-09 NOTE — Consult Note (Signed)
Cardiology Consult Note FULP, CAMMIE, MD No ref. provider found  Reason for consult: positive troponin  History of Present Illness (and review of medical records): [name] is a 79 y.o. female who presents for evaluation of altered mental status.  She resides at a assisted living facility.  She was in her usual state of health until day of presentation.  ALF called daughter and stated she did not participate in usual activities and felt weak.  She was then found to be minimally responsive, unable to follow commands and respond appropriately.  She has hx of dementia, however, this was a significant change for patient from baseline.  She was transferred to ED by EMS.  On presentation she was found to febrile, altered with leukocytosis and elevated lactic acid.  In addition, troponin returned positive at 1.18.  There were no reports of chest pain or shortness of breath leading up to presentation.  Review of Systems A comprehensive review of systems was negative other than stated in HPI. Patient Active Problem List   Diagnosis Date Noted  . Severe sepsis 10/09/2012  . Meningitis, bacterial 10/09/2012  . Demand ischemia of myocardium 10/09/2012  . UTI (urinary tract infection) 09/03/2012  . Acute respiratory distress 09/02/2012  . HCAP (healthcare-associated pneumonia) 09/02/2012  . Hypokalemia 09/02/2012  . Leukocytosis 09/02/2012  . ARF (acute renal failure) 09/02/2012  . Sacral pressure ulcer 05/24/2012  . Chronic diastolic heart failure 05/24/2012  . Acute respiratory failure 05/14/2012  . Acute on chronic diastolic heart failure 05/14/2012  . Acute renal failure 05/14/2012  . Chest pain 05/12/2012  . Acute exacerbation of CHF (congestive heart failure) 05/12/2012  . Volume overload 05/12/2012  . Bilateral leg edema 05/12/2012  . Hypoxia 05/12/2012  . Dependence on supplemental oxygen 05/12/2012  . Left ventricular hypertrophy due to hypertensive disease 05/12/2012  .  Chronic steroid use   . Chronic anemia   . Cardiomegaly - hypertensive   . Diastolic CHF   . S/P rotator cuff surgery   . History of hernia repair   . Colon cancer   . Temporal arteritis   . Hypertension   . Peripheral neuropathy   . TIA (transient ischemic attack)   . Generalized weakness 03/08/2012  . Altered mental status 03/08/2012  . Chronic UTI 03/08/2012  . Hypothyroidism 03/08/2012  . Hyperlipidemia 03/08/2012  . Depression 03/08/2012  . Dementia 03/08/2012   Past Medical History  Diagnosis Date  . Peripheral neuropathy   . Hypertension   . TIA (transient ischemic attack)   . Elevated sed rate   . Temporal arteritis     chronic steroids  . Hypothyroidism   . Colon cancer     colon  . History of hernia repair   . S/P rotator cuff surgery     right  . Diastolic CHF   . Cardiomegaly - hypertensive   . Chronic anemia   . Chronic steroid use   . Dementia   . Shortness of breath   . Hyperlipidemia   . CHF (congestive heart failure)   . Cardiomyopathy due to hypertension   . Dependence on supplemental oxygen   . Chronic UTI   . Dementia     Past Surgical History  Procedure Date  . Tonsillectomy   . Appendectomy   . Abdominal hysterectomy   . Colectomy     Prescriptions prior to admission  Medication Sig Dispense Refill  . acetaminophen (TYLENOL) 325 MG tablet Take 650 mg by mouth every 6 (six)  hours as needed. For pain      . albuterol (PROVENTIL) (2.5 MG/3ML) 0.083% nebulizer solution Take 2.5 mg by nebulization every 4 (four) hours as needed. For shortness of breath      . amLODipine (NORVASC) 5 MG tablet Take 5 mg by mouth daily.      . Cranberry 200 MG CAPS Take 200 mg by mouth 2 (two) times daily.      Marland Kitchen dipyridamole-aspirin (AGGRENOX) 25-200 MG per 12 hr capsule Take 1 capsule by mouth 2 (two) times daily.        Marland Kitchen donepezil (ARICEPT) 10 MG tablet Take 10 mg by mouth at bedtime.       Marland Kitchen ipratropium (ATROVENT) 0.02 % nebulizer solution Take 2.5 mLs  (0.5 mg total) by nebulization every 4 (four) hours as needed.  75 mL  0  . levothyroxine (SYNTHROID, LEVOTHROID) 175 MCG tablet Take 175 mcg by mouth every morning.      . loperamide (IMODIUM) 2 MG capsule Take 1 capsule (2 mg total) by mouth as needed for diarrhea or loose stools.  30 capsule  0  . Multiple Vitamin (MULITIVITAMIN WITH MINERALS) TABS Take 1 tablet by mouth daily.        Marland Kitchen omeprazole (PRILOSEC) 20 MG capsule Take 20 mg by mouth daily.        [name] (OYSTER CALCIUM) 500 MG TABS Take 500 mg of elemental calcium by mouth 2 (two) times daily.      . polycarbophil (FIBERCON) 625 MG tablet Take 625 mg by mouth daily.      . potassium chloride SA (K-DUR,KLOR-CON) 20 MEQ tablet Take 20 mEq by mouth 2 (two) times daily.      . predniSONE (DELTASONE) 5 MG tablet Take 10-15 mg by mouth daily. Alternate 10mg  and 15 mg doses      . saccharomyces boulardii (FLORASTOR) 250 MG capsule Take 250 mg by mouth 2 (two) times daily.      . sertraline (ZOLOFT) 50 MG tablet Take 50 mg by mouth daily.        . simvastatin (ZOCOR) 20 MG tablet Take 20 mg by mouth at bedtime.        . sulfamethoxazole-trimethoprim (BACTRIM,SEPTRA) 400-80 MG per tablet Take 1 tablet by mouth at bedtime.      . torsemide (DEMADEX) 20 MG tablet Take 60 mg by mouth daily.       Allergies  Allergen Reactions  . Apap (Acetaminophen)     Unknown  . Ciprofloxacin Nausea And Vomiting  . Codeine Other (See Comments)    halucinations  . Demerol Other (See Comments)    halucinations  . Morphine And Related Other (See Comments)    halucinations  . Nitrofurantoin Monohyd Macro Other (See Comments)    Per MAR  . Pregabalin Other (See Comments)    unknown  . Septra (Bactrim) Other (See Comments)    Unknown cant tolerate DS    History  Substance Use Topics  . Smoking status: Never Smoker   . Smokeless tobacco: Not on file  . Alcohol Use: No    History reviewed. No pertinent family history.   Objective: Patient  Vitals for the past 8 hrs:  BP Temp Temp src Pulse Resp SpO2 Height Weight  10/09/12 0600 117/55 mmHg 99.7 F (37.6 C) - 86  20  100 % - -  10/09/12 0515 - 99.9 F (37.7 C) - 86  19  100 % - -  10/09/12 0500 115/50 mmHg  99.9 F (37.7 C) - 88  22  100 % - -  10/09/12 0445 116/48 mmHg 99.9 F (37.7 C) - 89  22  100 % - -  10/09/12 0430 129/47 mmHg 99.9 F (37.7 C) - 88  23  100 % - -  10/09/12 0415 126/43 mmHg 100 F (37.8 C) - 87  22  99 % - -  10/09/12 0400 118/46 mmHg 100 F (37.8 C) - 86  25  98 % - -  10/09/12 0345 122/51 mmHg 100.2 F (37.9 C) - 89  24  97 % - -  10/09/12 0330 135/61 mmHg 100.2 F (37.9 C) - 87  20  97 % - -  10/09/12 0315 129/59 mmHg 100.4 F (38 C) - 85  21  99 % - -  10/09/12 0308 - - - - - 100 % - -  10/09/12 0300 139/52 mmHg 100.4 F (38 C) - 82  21  100 % - -  10/09/12 0230 136/66 mmHg 100.8 F (38.2 C) - 83  21  100 % - -  10/09/12 0227 144/72 mmHg 100.8 F (38.2 C) Core 90  23  99 % 5\' 9"  (1.753 m) 89.4 kg (197 lb 1.5 oz)  10/09/12 0200 124/39 mmHg 100.9 F (38.3 C) - 64  20  89 % - -  10/09/12 0130 132/47 mmHg 100.9 F (38.3 C) - 80  21  100 % - -  10/09/12 0100 135/51 mmHg 100.8 F (38.2 C) - 85  24  100 % - -  10/09/12 0030 167/53 mmHg 100.4 F (38 C) Core 83  22  100 % - -  10/08/12 2330 159/72 mmHg 100 F (37.8 C) - 79  20  98 % - -  10/08/12 2300 165/55 mmHg 99.9 F (37.7 C) - 80  21  98 % - -   General Appearance:    Awake, on NRB  Head:    Normocephalic, without obvious abnormality, atraumatic  Eyes:     PERRL, EOMI, anicteric sclerae  Neck:   Supple, no carotid bruit or JVD  Lungs:     Clear to auscultation bilaterally, respirations unlabored  Heart:    Regular rate and rhythm, S1 and S2 normal, no murmurs  Abdomen:     Soft, non-tender, normoactive bowel sounds  Extremities:   Extremities normal, atraumatic, no cyanosis or edema  Pulses:   2+ and symmetric all extremities  Skin:   Multiple areas of ecchymosis on extremities    Neurologic:   Does not follow commands, tracks to voice, open eyes   Results for orders placed during the hospital encounter of 10/08/12 (from the past 48 hour(s))  CBC WITH DIFFERENTIAL     Status: Abnormal   Collection Time   10/08/12  5:23 PM      Component Value Range Comment   WBC 23.8 (*) 4.0 - 10.5 K/uL    RBC 4.04  3.87 - 5.11 MIL/uL    Hemoglobin 12.5  12.0 - 15.0 g/dL    HCT 16.1  09.6 - 04.5 %    MCV 111.4 (*) 78.0 - 100.0 fL    MCH 30.9  26.0 - 34.0 pg    MCHC 27.8 (*) 30.0 - 36.0 g/dL    RDW 40.9  81.1 - 91.4 %    Platelets 231  150 - 400 K/uL    Neutrophils Relative 50  43 - 77 %    Lymphocytes Relative 46  12 - 46 %  Monocytes Relative 4  3 - 12 %    Eosinophils Relative 0  0 - 5 %    Basophils Relative 0  0 - 1 %    Neutro Abs 11.9 (*) 1.7 - 7.7 K/uL    Lymphs Abs 10.9 (*) 0.7 - 4.0 K/uL    Monocytes Absolute 1.0  0.1 - 1.0 K/uL    Eosinophils Absolute 0.0  0.0 - 0.7 K/uL    Basophils Absolute 0.0  0.0 - 0.1 K/uL    WBC Morphology ATYPICAL LYMPHOCYTES     COMPREHENSIVE METABOLIC PANEL     Status: Abnormal   Collection Time   10/08/12  5:23 PM      Component Value Range Comment   Sodium 136  135 - 145 mEq/L    Potassium 3.9  3.5 - 5.1 mEq/L    Chloride 91 (*) 96 - 112 mEq/L    CO2 21  19 - 32 mEq/L    Glucose, Bld 241 (*) 70 - 99 mg/dL    BUN 15  6 - 23 mg/dL    Creatinine, Ser 8.29 (*) 0.50 - 1.10 mg/dL    Calcium 9.5  8.4 - 56.2 mg/dL    Total Protein 7.5  6.0 - 8.3 g/dL    Albumin 3.2 (*) 3.5 - 5.2 g/dL    AST 30  0 - 37 U/L    ALT 21  0 - 35 U/L    Alkaline Phosphatase 85  39 - 117 U/L    Total Bilirubin 0.2 (*) 0.3 - 1.2 mg/dL    GFR calc non Af Amer 30 (*) >90 mL/min    GFR calc Af Amer 35 (*) >90 mL/min   PRO B NATRIURETIC PEPTIDE     Status: Abnormal   Collection Time   10/08/12  5:23 PM      Component Value Range Comment   Pro B Natriuretic peptide (BNP) 1356.0 (*) 0 - 450 pg/mL   PROTIME-INR     Status: Normal   Collection Time   10/08/12   5:23 PM      Component Value Range Comment   Prothrombin Time 13.5  11.6 - 15.2 seconds    INR 1.04  0.00 - 1.49   POCT I-STAT 3, BLOOD GAS (G3+)     Status: Abnormal   Collection Time   10/08/12  5:46 PM      Component Value Range Comment   pH, Arterial 7.305 (*) 7.350 - 7.450    pCO2 arterial 45.7 (*) 35.0 - 45.0 mmHg    pO2, Arterial 58.0 (*) 80.0 - 100.0 mmHg    Bicarbonate 22.8  20.0 - 24.0 mEq/L    TCO2 24  0 - 100 mmol/L    O2 Saturation 87.0      Acid-base deficit 4.0 (*) 0.0 - 2.0 mmol/L    Patient temperature 98.6 F      Collection site RADIAL, ALLEN'S TEST ACCEPTABLE      Drawn by RT      Sample type ARTERIAL     URINALYSIS, ROUTINE W REFLEX MICROSCOPIC     Status: Abnormal   Collection Time   10/08/12  5:47 PM      Component Value Range Comment   Color, Urine YELLOW  YELLOW    APPearance CLEAR  CLEAR    Specific Gravity, Urine 1.010  1.005 - 1.030    pH 7.5  5.0 - 8.0    Glucose, UA NEGATIVE  NEGATIVE mg/dL  Hgb urine dipstick MODERATE (*) NEGATIVE    Bilirubin Urine NEGATIVE  NEGATIVE    Ketones, ur NEGATIVE  NEGATIVE mg/dL    Protein, ur 811 (*) NEGATIVE mg/dL    Urobilinogen, UA 0.2  0.0 - 1.0 mg/dL    Nitrite NEGATIVE  NEGATIVE    Leukocytes, UA NEGATIVE  NEGATIVE   URINE MICROSCOPIC-ADD ON     Status: Abnormal   Collection Time   10/08/12  5:47 PM      Component Value Range Comment   Squamous Epithelial / LPF RARE  RARE    WBC, UA 0-2  <3 WBC/hpf    RBC / HPF 0-2  <3 RBC/hpf    Bacteria, UA RARE  RARE    Casts HYALINE CASTS (*) NEGATIVE    Urine-Other MUCOUS PRESENT     POCT I-STAT TROPONIN I     Status: Abnormal   Collection Time   10/08/12  8:11 PM      Component Value Range Comment   Troponin i, poc 1.06 (*) 0.00 - 0.08 ng/mL    Comment NOTIFIED PHYSICIAN      Comment 3            CG4 I-STAT (LACTIC ACID)     Status: Abnormal   Collection Time   10/08/12  8:13 PM      Component Value Range Comment   Lactic Acid, Venous 5.53 (*) 0.5 - 2.2  mmol/L   LACTIC ACID, PLASMA     Status: Abnormal   Collection Time   10/08/12  9:57 PM      Component Value Range Comment   Lactic Acid, Venous 3.2 (*) 0.5 - 2.2 mmol/L   GLUCOSE, CSF     Status: Abnormal   Collection Time   10/08/12  9:58 PM      Component Value Range Comment   Glucose, CSF 82 (*) 43 - 76 mg/dL   PROTEIN, CSF     Status: Abnormal   Collection Time   10/08/12  9:58 PM      Component Value Range Comment   Total  Protein, CSF 50 (*) 15 - 45 mg/dL   CSF CELL COUNT WITH DIFFERENTIAL     Status: Abnormal   Collection Time   10/08/12  9:58 PM      Component Value Range Comment   Tube # tube 1      Color, CSF COLORLESS  COLORLESS    Appearance, CSF HAZY (*) CLEAR    Supernatant NOT INDICATED      RBC Count, CSF 23 (*) 0 /cu mm    WBC, CSF 480 (*) 0 - 5 /cu mm    Segmented Neutrophils-CSF 89 (*) 0 - 6 %    Lymphs, CSF 5 (*) 40 - 80 %    Monocyte-Macrophage-Spinal Fluid 6 (*) 15 - 45 %    Eosinophils, CSF 0  0 - 1 %   CSF CELL COUNT WITH DIFFERENTIAL     Status: Abnormal   Collection Time   10/08/12  9:58 PM      Component Value Range Comment   Tube # tube 4      Color, CSF COLORLESS  COLORLESS    Appearance, CSF CLOUDY (*) CLEAR    Supernatant NOT INDICATED      RBC Count, CSF 2550 (*) 0 /cu mm    WBC, CSF 455 (*) 0 - 5 /cu mm    Segmented Neutrophils-CSF 93 (*) 0 - 6 %  Lymphs, CSF 2 (*) 40 - 80 %    Monocyte-Macrophage-Spinal Fluid 5 (*) 15 - 45 %    Eosinophils, CSF 0  0 - 1 %   GRAM STAIN     Status: Normal   Collection Time   10/08/12  9:58 PM      Component Value Range Comment   Specimen Description CSF      Special Requests NO 2 1CC      Gram Stain        Value: CYTOSPIN SLIDE     WBC PRESENT,BOTH PMN AND MONONUCLEAR     NO ORGANISMS SEEN   Report Status 10/08/2012 FINAL     TROPONIN I     Status: Abnormal   Collection Time   10/08/12  9:58 PM      Component Value Range Comment   Troponin I 1.18 (*) <0.30 ng/mL   POCT I-STAT TROPONIN I      Status: Abnormal   Collection Time   10/08/12 11:26 PM      Component Value Range Comment   Troponin i, poc 1.63 (*) 0.00 - 0.08 ng/mL    Comment NOTIFIED PHYSICIAN      Comment 3            CG4 I-STAT (LACTIC ACID)     Status: Normal   Collection Time   10/08/12 11:28 PM      Component Value Range Comment   Lactic Acid, Venous 2.02  0.5 - 2.2 mmol/L   MRSA PCR SCREENING     Status: Normal   Collection Time   10/09/12  4:22 AM      Component Value Range Comment   MRSA by PCR NEGATIVE  NEGATIVE   COMPREHENSIVE METABOLIC PANEL     Status: Abnormal   Collection Time   10/09/12  4:55 AM      Component Value Range Comment   Sodium 138  135 - 145 mEq/L    Potassium 3.9  3.5 - 5.1 mEq/L    Chloride 99  96 - 112 mEq/L    CO2 28  19 - 32 mEq/L    Glucose, Bld 149 (*) 70 - 99 mg/dL    BUN 15  6 - 23 mg/dL    Creatinine, Ser 4.09 (*) 0.50 - 1.10 mg/dL    Calcium 8.8  8.4 - 81.1 mg/dL    Total Protein 6.7  6.0 - 8.3 g/dL    Albumin 2.7 (*) 3.5 - 5.2 g/dL    AST 40 (*) 0 - 37 U/L    ALT 22  0 - 35 U/L    Alkaline Phosphatase 71  39 - 117 U/L    Total Bilirubin 0.3  0.3 - 1.2 mg/dL    GFR calc non Af Amer 35 (*) >90 mL/min    GFR calc Af Amer 40 (*) >90 mL/min   TROPONIN I     Status: Abnormal   Collection Time   10/09/12  5:00 AM      Component Value Range Comment   Troponin I 2.33 (*) <0.30 ng/mL    Ct Head Wo Contrast  10/08/2012  *RADIOLOGY REPORT*  Clinical Data: Fever, unresponsive.  CT HEAD WITHOUT CONTRAST  Technique:  Contiguous axial images were obtained from the base of the skull through the vertex without contrast.  Comparison: 05/14/2012  Findings: Atherosclerotic and physiologic intracranial calcifications.  Motion degraded many images, requiring rescanning. Diffuse parenchymal atrophy. Patchy areas of hypoattenuation in deep and periventricular white matter  bilaterally.  Prominence of lateral and third ventricles as before.  Negative for acute intracranial hemorrhage, mass  lesion, acute infarction, midline shift, or mass-effect. Acute infarct may be inapparent on noncontrast CT. Ventricles and sulci symmetric. Bone windows demonstrate no focal lesion.  IMPRESSION:  1. Negative for bleed or other acute intracranial process.  2. Atrophy and nonspecific white matter changes.   Original Report Authenticated By: D. Andria Rhein, MD    Dg Chest Portable 1 View  10/08/2012  *RADIOLOGY REPORT*  Clinical Data: CPR, fever.  PORTABLE CHEST - 1 VIEW  Comparison: 09/02/2012  Findings: Stable cardiomegaly.  Atheromatous aorta.  Some increase in mild interstitial edema and pulmonary vascular congestion. Persistent blunting of the left lateral costophrenic angle. Regional bones unremarkable.  IMPRESSION:  1.  Stable cardiomegaly with some increase in mild interstitial edema.   Original Report Authenticated By: D. Andria Rhein, MD     ECG:  Sinus rhythm, RBBB   Impression: 73F presents with altered mental status, febrile, with acute respiratory failure requiring BiPap with nonspecific troponin elevation probably likely to demand ischemia.  Less likely acute coronary syndrome.  She has known hx of heart failure with preserved systolic function.  She does have elevated BNP but does not appear to be in volume overload at this time.  Recommendations: 1. Trend cardiac biomarkers till peak. 2.  ECG prn if chest pain, rhythm changes. 3.  ASA 325mg , no anticoagulation at this time. 4.  May consider TTE reassess if any change in systolic function or wall motion abnormalities. 5.  Work up for acute causes of AMS per primary team. 6.  Would not aggressively diurese at this time until sepsis ruled out and patient resuscitated. 7.  Monitor strict I/Os, daily weights to prevent significant volume overload.  8. DNR/DNI

## 2012-10-09 NOTE — Progress Notes (Signed)
[facility]  Pt admitted this AM- she is alert, having EEG performed, c/o being cold and wet (sweating). No other complaints  A/P 1. Meningitis- likely bacterial - I have requested an ID eval and Dr Ninetta Lights has modified antibiotics. Cont Decadron- will decrease dose.  2. Severe Sepsis- due to above 3. Acute resp failure- due to sepsis or pulm edema?- CXr revealed mild CHF-  Will be holding fluids- she received a dose of Demedex this AM but will hold - f/u in AM for resumption 4. Diastoilc CHF- on Demadex at home which has been resumed- also on IVF- will hold Demedex and IVF and monitor 5. Elevated troponins- agree that this is demand ischemia 6. Encephalopathy- due to sepsis- follow 7. Temporal arteritis- on chronic Prednisone  Calvert Cantor, MD [phone]

## 2012-10-09 NOTE — Progress Notes (Signed)
ANTIBIOTIC CONSULT NOTE - INITIAL  Pharmacy Consult for Vancocmycin/Cefepime Indication: meningitis  Allergies  Allergen Reactions  . Apap (Acetaminophen)     Unknown  . Ciprofloxacin Nausea And Vomiting  . Codeine Other (See Comments)    halucinations  . Demerol Other (See Comments)    halucinations  . Morphine And Related Other (See Comments)    halucinations  . Nitrofurantoin Monohyd Macro Other (See Comments)    Per MAR  . Pregabalin Other (See Comments)    unknown  . Septra (Bactrim) Other (See Comments)    Unknown cant tolerate DS    Patient Measurements: Height: 5\' 9"  (175.3 cm) Weight: 197 lb 1.5 oz (89.4 kg) IBW/kg (Calculated) : 66.2   Vital Signs: Temp: 100.8 F (38.2 C) (01/14 0230) Temp src: Core (Comment) (01/14 0227) BP: 136/66 mmHg (01/14 0230) Pulse Rate: 83  (01/14 0230) Intake/Output from previous day: 01/13 0701 - 01/14 0700 In: 75 [I.V.:75] Out: -  Intake/Output from this shift: Total I/O In: 75 [I.V.:75] Out: -   Labs:  Basename 10/08/12 1723  WBC 23.8*  HGB 12.5  PLT 231  LABCREA --  CREATININE 1.56*   Estimated Creatinine Clearance: 34.3 ml/min (by C-G formula based on Cr of 1.56). No results found for this basename: VANCOTROUGH:2,VANCOPEAK:2,VANCORANDOM:2,GENTTROUGH:2,GENTPEAK:2,GENTRANDOM:2,TOBRATROUGH:2,TOBRAPEAK:2,TOBRARND:2,AMIKACINPEAK:2,AMIKACINTROU:2,AMIKACIN:2, in the last 72 hours   Microbiology: Recent Results (from the past 720 hour(s))  GRAM STAIN     Status: Normal   Collection Time   10/08/12  9:58 PM      Component Value Range Status Comment   Specimen Description CSF   Final    Special Requests NO 2 1CC   Final    Gram Stain     Final    Value: CYTOSPIN SLIDE     WBC PRESENT,BOTH PMN AND MONONUCLEAR     NO ORGANISMS SEEN   Report Status 10/08/2012 FINAL   Final     Medical History: Past Medical History  Diagnosis Date  . Peripheral neuropathy   . Hypertension   . TIA (transient ischemic attack)   .  Elevated sed rate   . Temporal arteritis     chronic steroids  . Hypothyroidism   . Colon cancer     colon  . History of hernia repair   . S/P rotator cuff surgery     right  . Diastolic CHF   . Cardiomegaly - hypertensive   . Chronic anemia   . Chronic steroid use   . Dementia   . Shortness of breath   . Hyperlipidemia   . CHF (congestive heart failure)   . Cardiomyopathy due to hypertension   . Dependence on supplemental oxygen   . Chronic UTI   . Dementia     Medications:  Prescriptions prior to admission  Medication Sig Dispense Refill  . acetaminophen (TYLENOL) 325 MG tablet Take 650 mg by mouth every 6 (six) hours as needed. For pain      . albuterol (PROVENTIL) (2.5 MG/3ML) 0.083% nebulizer solution Take 2.5 mg by nebulization every 4 (four) hours as needed. For shortness of breath      . amLODipine (NORVASC) 5 MG tablet Take 5 mg by mouth daily.      . Cranberry 200 MG CAPS Take 200 mg by mouth 2 (two) times daily.      Marland Kitchen dipyridamole-aspirin (AGGRENOX) 25-200 MG per 12 hr capsule Take 1 capsule by mouth 2 (two) times daily.        Marland Kitchen donepezil (ARICEPT) 10 MG tablet  Take 10 mg by mouth at bedtime.       Marland Kitchen ipratropium (ATROVENT) 0.02 % nebulizer solution Take 2.5 mLs (0.5 mg total) by nebulization every 4 (four) hours as needed.  75 mL  0  . levothyroxine (SYNTHROID, LEVOTHROID) 175 MCG tablet Take 175 mcg by mouth every morning.      . loperamide (IMODIUM) 2 MG capsule Take 1 capsule (2 mg total) by mouth as needed for diarrhea or loose stools.  30 capsule  0  . Multiple Vitamin (MULITIVITAMIN WITH MINERALS) TABS Take 1 tablet by mouth daily.        Marland Kitchen omeprazole (PRILOSEC) 20 MG capsule Take 20 mg by mouth daily.        [name] (OYSTER CALCIUM) 500 MG TABS Take 500 mg of elemental calcium by mouth 2 (two) times daily.      . polycarbophil (FIBERCON) 625 MG tablet Take 625 mg by mouth daily.      . potassium chloride SA (K-DUR,KLOR-CON) 20 MEQ tablet Take 20 mEq  by mouth 2 (two) times daily.      . predniSONE (DELTASONE) 5 MG tablet Take 10-15 mg by mouth daily. Alternate 10mg  and 15 mg doses      . saccharomyces boulardii (FLORASTOR) 250 MG capsule Take 250 mg by mouth 2 (two) times daily.      . sertraline (ZOLOFT) 50 MG tablet Take 50 mg by mouth daily.        . simvastatin (ZOCOR) 20 MG tablet Take 20 mg by mouth at bedtime.        . sulfamethoxazole-trimethoprim (BACTRIM,SEPTRA) 400-80 MG per tablet Take 1 tablet by mouth at bedtime.      . torsemide (DEMADEX) 20 MG tablet Take 60 mg by mouth daily.       Assessment: 79 yo female with AMS, proable bacterial meningitis, for empiric antibiotics.  Vancomycin 1 g IV given in ED at 6pm yesterday  Goal of Therapy:  Vancomycin trough level 15-20 mcg/ml  Plan:  Vancomycin 1 g IV q48h, next dose at 6 am. Cefepime 2 g IV q12h  [name] 10/09/2012,2:55 AM

## 2012-10-09 NOTE — ED Notes (Signed)
Family notified of pt's inpatient bed

## 2012-10-09 NOTE — Care Management Note (Unsigned)
    Page 1 of 1   10/11/2012     3:09:55 PM   CARE MANAGEMENT NOTE 10/11/2012  Patient:  MYRON, LONA A   Account Number:  [id number]  Date Initiated:  10/09/2012  Documentation initiated by:  [facility]  Subjective/Objective Assessment:   AMS - Septic  From AL.     Action/Plan:   WILL CONSULT CSW TO FACILITATE RETURN TO ALF WHEN MEDICALLY STABLE.   Anticipated DC Date:  10/16/2012   Anticipated DC Plan:  ASSISTED LIVING / REST HOME  In-house referral  Clinical Social Worker      DC Planning Services  CM consult      Choice offered to / List presented to:             Status of service:  In process, will continue to follow Medicare Important Message given?   (If response is "NO", the following Medicare IM given date fields will be blank) Date Medicare IM given:   Date Additional Medicare IM given:    Discharge Disposition:    Per UR Regulation:  Reviewed for med. necessity/level of care/duration of stay  If discussed at Long Length of Stay Meetings, dates discussed:    Comments:  Contact:  [name],[name] Daughter [phone]   [phone]                 [name],Jim Relative     [phone]   10/11/12 Roslyn Else,RN,BSN [phone] PT IS FROM [facility].

## 2012-10-09 NOTE — Progress Notes (Signed)
ANTIBIOTIC CONSULT NOTE - FOLLOW UP  Pharmacy Consult for vancomycin Indication: r/o meningitis  Allergies  Allergen Reactions  . Apap (Acetaminophen)     Unknown  . Ciprofloxacin Nausea And Vomiting  . Codeine Other (See Comments)    halucinations  . Demerol Other (See Comments)    halucinations  . Morphine And Related Other (See Comments)    halucinations  . Nitrofurantoin Monohyd Macro Other (See Comments)    Per MAR  . Pregabalin Other (See Comments)    unknown  . Septra (Bactrim) Other (See Comments)    Unknown cant tolerate DS    Patient Measurements: Height: 5\' 9"  (175.3 cm) Weight: 197 lb 1.5 oz (89.4 kg) IBW/kg (Calculated) : 66.2    Vital Signs: Temp: 99.7 F (37.6 C) (01/14 0700) Temp src: Core (Comment) (01/14 0227) BP: 117/55 mmHg (01/14 0600) Pulse Rate: 86  (01/14 0700) Intake/Output from previous day: 01/13 0701 - 01/14 0700 In: 585 [I.V.:285; IV Piggyback:300] Out: 500 [Urine:500] Intake/Output from this shift:    Labs:  Basename 10/09/12 0455 10/08/12 1723  WBC -- 23.8*  HGB -- 12.5  PLT -- 231  LABCREA -- --  CREATININE 1.39* 1.56*   Estimated Creatinine Clearance: 38.5 ml/min (by C-G formula based on Cr of 1.39). No results found for this basename: VANCOTROUGH:2,VANCOPEAK:2,VANCORANDOM:2,GENTTROUGH:2,GENTPEAK:2,GENTRANDOM:2,TOBRATROUGH:2,TOBRAPEAK:2,TOBRARND:2,AMIKACINPEAK:2,AMIKACINTROU:2,AMIKACIN:2, in the last 72 hours     Assessment: 79 yo female with concern for meningitis (CSF with significant WBC and neutrophils) on vancomycin/cefepime. SCr=1.39 and CrCl~40  Goal of Therapy:  Vancomycin trough level 15-20 mcg/ml  Plan:  -Will change vanc to 1250mg  iv q24h -No cefepime changes needed -Will follow cultures  Harland German, Pharm D 10/09/2012 8:10 AM

## 2012-10-09 NOTE — Progress Notes (Signed)
EEG completed.

## 2012-10-09 NOTE — Clinical Social Work Psychosocial (Signed)
     Clinical Social Work Department BRIEF PSYCHOSOCIAL ASSESSMENT 10/09/2012  Patient:  [name], [name]     Account Number:  [id number]     Admit date:  10/08/2012  Clinical Social Worker:  Hulan Fray  Date/Time:  10/09/2012 03:30 PM  Referred by:  Care Management  Date Referred:  10/09/2012 Referred for  Other - See comment   Other Referral:   admit from facility   Interview type:  Family Other interview type:   Daughter- [name]  Jamie-admissions coordinator at facility.    PSYCHOSOCIAL DATA Living Status:  FACILITY Admitted from facility:  OTHER Level of care:  Assisted Living Primary support name:  [name] Primary support relationship to patient:  CHILD, ADULT Degree of support available:   supportive    CURRENT CONCERNS Current Concerns  None Noted   Other Concerns:    SOCIAL WORK ASSESSMENT / PLAN Clinical Social Worker received referral for patient being admitted from facility. CSW called daughter,[name] and introduced self and explained reason for call. Daughter confirmed patient is a resident of [address] [facility] [phone])  facility and plans to return at discharge.  CSW spoke with Asher Muir, representative from [facility] and when patient is medically stable, they will need discharge summary to be faxed to [phone]. CSW will continue to follow and complete FL2 for MD's signature.   Assessment/plan status:  Psychosocial Support/Ongoing Assessment of Needs Other assessment/ plan:   Information/referral to community resources:   Patient is from facility    PATIENTS/FAMILYS RESPONSE TO PLAN OF CARE: Daughter reported that the plan is for patient to return back to the facility, [facility] at discharge.

## 2012-10-09 NOTE — H&P (Signed)
eeg completed at bedside

## 2012-10-09 NOTE — ED Notes (Signed)
Attempted to call report. Floor RN unable to accept report.  

## 2012-10-10 DIAGNOSIS — I248 Other forms of acute ischemic heart disease: Secondary | ICD-10-CM

## 2012-10-10 DIAGNOSIS — A419 Sepsis, unspecified organism: Principal | ICD-10-CM

## 2012-10-10 DIAGNOSIS — J984 Other disorders of lung: Secondary | ICD-10-CM

## 2012-10-10 DIAGNOSIS — G009 Bacterial meningitis, unspecified: Secondary | ICD-10-CM

## 2012-10-10 LAB — BASIC METABOLIC PANEL
CO2: 29 mEq/L (ref 19–32)
Calcium: 8.9 mg/dL (ref 8.4–10.5)
Chloride: 100 mEq/L (ref 96–112)
GFR calc Af Amer: 53 mL/min — ABNORMAL LOW (ref >90)
Sodium: 143 mEq/L (ref 135–145)

## 2012-10-10 LAB — CBC
Platelets: 218 10*3/uL (ref 150–400)
RBC: 3.5 MIL/uL — ABNORMAL LOW (ref 3.87–5.11)
RDW: 14.6 % (ref 11.5–15.5)
WBC: 18.1 10*3/uL — ABNORMAL HIGH (ref 4.0–10.5)

## 2012-10-10 LAB — GLUCOSE, CAPILLARY: Glucose-Capillary: 144 mg/dL — ABNORMAL HIGH (ref 70–99)

## 2012-10-10 LAB — T4, FREE: Free T4: 1.77 ng/dL (ref 0.80–1.80)

## 2012-10-10 LAB — T3, FREE: T3, Free: 1.9 pg/mL — ABNORMAL LOW (ref 2.3–4.2)

## 2012-10-10 MED ORDER — ALBUTEROL SULFATE (5 MG/ML) 0.5% IN NEBU: 2.5000 mg | INHALATION_SOLUTION | RESPIRATORY_TRACT | Status: DC | PRN

## 2012-10-10 MED ORDER — AMLODIPINE BESYLATE 10 MG PO TABS
10.0000 mg | ORAL_TABLET | Freq: Every day | ORAL | Status: DC
  Administered 2012-10-11 – 2012-10-12 (×2): 10 mg via ORAL
  Filled 2012-10-10 (×2): qty 1

## 2012-10-10 MED ORDER — POTASSIUM CHLORIDE CRYS ER 20 MEQ PO TBCR
40.0000 meq | EXTENDED_RELEASE_TABLET | Freq: Two times a day (BID) | ORAL | Status: AC
  Administered 2012-10-10 – 2012-10-11 (×3): 40 meq via ORAL
  Filled 2012-10-10 (×3): qty 2

## 2012-10-10 MED ORDER — LEVOTHYROXINE SODIUM 150 MCG PO TABS
150.0000 ug | ORAL_TABLET | Freq: Every day | ORAL | Status: DC
  Administered 2012-10-12: 150 ug via ORAL
  Filled 2012-10-10 (×2): qty 1

## 2012-10-10 MED ORDER — POTASSIUM CHLORIDE CRYS ER 20 MEQ PO TBCR: 40.0000 meq | EXTENDED_RELEASE_TABLET | ORAL | Status: DC

## 2012-10-10 MED ORDER — ACETAMINOPHEN 325 MG PO TABS: 650.0000 mg | ORAL_TABLET | Freq: Four times a day (QID) | ORAL | Status: DC | PRN

## 2012-10-10 MED ORDER — PANTOPRAZOLE SODIUM 40 MG PO TBEC
40.0000 mg | DELAYED_RELEASE_TABLET | Freq: Every day | ORAL | Status: DC
  Administered 2012-10-10 – 2012-10-12 (×3): 40 mg via ORAL
  Filled 2012-10-10 (×3): qty 1

## 2012-10-10 MED ORDER — ALBUTEROL SULFATE (5 MG/ML) 0.5% IN NEBU: 2.5000 mg | INHALATION_SOLUTION | Freq: Four times a day (QID) | RESPIRATORY_TRACT | Status: DC | PRN

## 2012-10-10 NOTE — Progress Notes (Signed)
INFECTIOUS DISEASE PROGRESS NOTE  ID: [name] is a 79 y.o. female with  Principal Problem:  *Severe sepsis Active Problems:  Altered mental status  Hypothyroidism  Diastolic CHF  Temporal arteritis  Hypertension  Acute respiratory failure  ARF (acute renal failure)  Meningitis, bacterial  Demand ischemia of myocardium  Subjective: Awake and alert.   Abtx:  Anti-infectives     Start     Dose/Rate Route Frequency Ordered Stop   10/09/12 1600   ampicillin (OMNIPEN) 2 g in sodium chloride 0.9 % 50 mL IVPB        2 g 150 mL/hr over 20 Minutes Intravenous 6 times per day 10/09/12 1356     10/09/12 1300   cefTRIAXone (ROCEPHIN) 2 g in dextrose 5 % 50 mL IVPB        2 g 100 mL/hr over 30 Minutes Intravenous Every 12 hours 10/09/12 1202     10/09/12 0600   ampicillin (OMNIPEN) 1 g in sodium chloride 0.9 % 50 mL IVPB  Status:  Discontinued        1 g 150 mL/hr over 20 Minutes Intravenous 4 times per day 10/09/12 0239 10/09/12 1356   10/09/12 0600   vancomycin (VANCOCIN) IVPB 1000 mg/200 mL premix  Status:  Discontinued        1,000 mg 200 mL/hr over 60 Minutes Intravenous Every 48 hours 10/09/12 0305 10/09/12 0811   10/09/12 0600   ceFEPIme (MAXIPIME) 2 g in dextrose 5 % 50 mL IVPB  Status:  Discontinued        2 g 100 mL/hr over 30 Minutes Intravenous 2 times daily 10/09/12 0305 10/09/12 1202   10/09/12 0600   vancomycin (VANCOCIN) 1,250 mg in sodium chloride 0.9 % 250 mL IVPB        1,250 mg 166.7 mL/hr over 90 Minutes Intravenous Every 24 hours 10/09/12 0811     10/08/12 2330   ampicillin (OMNIPEN) 2 g in sodium chloride 0.9 % 50 mL IVPB        2 g 150 mL/hr over 20 Minutes Intravenous  Once 10/08/12 2320 10/09/12 0041   10/08/12 1800   vancomycin (VANCOCIN) 1,250 mg in sodium chloride 0.9 % 250 mL IVPB     Comments: (15mg /kg)      1,250 mg 166.7 mL/hr over 90 Minutes Intravenous  Once 10/08/12 1736 10/08/12 2025   10/08/12 1745   cefTRIAXone (ROCEPHIN) 2  g in dextrose 5 % 50 mL IVPB        2 g 100 mL/hr over 30 Minutes Intravenous  Once 10/08/12 1736 10/08/12 2122   10/08/12 1745  piperacillin-tazobactam (ZOSYN) IVPB 3.375 g       3.375 g 100 mL/hr over 30 Minutes Intravenous  Once 10/08/12 1736 10/08/12 2236          Medications:  Scheduled:   . amLODipine  10 mg Oral Daily  . ampicillin (OMNIPEN) IV  2 g Intravenous Q4H  . antiseptic oral rinse  15 mL Mouth Rinse QID  . cefTRIAXone (ROCEPHIN)  IV  2 g Intravenous Q12H  . dexamethasone  6 mg Intravenous Q6H  . dipyridamole-aspirin  1 capsule Oral BID  . donepezil  10 mg Oral QHS  . heparin  5,000 Units Subcutaneous Q8H  . insulin aspart  0-15 Units Subcutaneous TID WC  . levothyroxine  150 mcg Oral QAC breakfast  . multivitamin with minerals  1 tablet Oral Daily  . pantoprazole  40 mg Oral Daily  .  potassium chloride  40 mEq Oral BID  . psyllium  1 packet Oral Daily  . saccharomyces boulardii  250 mg Oral BID  . sertraline  50 mg Oral Daily  . simvastatin  20 mg Oral QHS  . sodium chloride  3 mL Intravenous Q12H  . vancomycin  1,250 mg Intravenous Q24H    Objective: Vital signs in last 24 hours: Temp:  [98.6 F (37 C)-99.7 F (37.6 C)] 99.1 F (37.3 C) (01/15 1200) Pulse Rate:  [30-102] 89  (01/15 1200) Resp:  [13-24] 21  (01/15 1200) BP: (121-154)/(42-63) 141/55 mmHg (01/15 1200) SpO2:  [64 %-100 %] 97 % (01/15 1200)   General appearance: alert, cooperative and no distress Neck: able to ante-flex her neck with minimal pain. Resp: clear to auscultation bilaterally Cardio: regular rate and rhythm GI: normal findings: bowel sounds normal and soft, non-tender  Lab Results  Basename 10/10/12 0859 10/09/12 0700 10/09/12 0455  WBC 18.1* 12.8* --  HGB 10.6* 11.9* --  HCT 32.1* 37.1 --  NA 143 -- 138  K 3.0* -- 3.9  CL 100 -- 99  CO2 29 -- 28  BUN 18 -- 15  CREATININE 1.11* -- 1.39*  GLU -- -- --   Liver Panel  Basename 10/09/12 0455 10/08/12 1723    PROT 6.7 7.5  ALBUMIN 2.7* 3.2*  AST 40* 30  ALT 22 21  ALKPHOS 71 85  BILITOT 0.3 0.2*  BILIDIR -- --  IBILI -- --   Sedimentation Rate No results found for this basename: ESRSEDRATE in the last 72 hours C-Reactive Protein No results found for this basename: CRP:2 in the last 72 hours  Microbiology: Recent Results (from the past 240 hour(s))  CULTURE, BLOOD (ROUTINE X 2)     Status: Normal (Preliminary result)   Collection Time   10/08/12  5:45 PM      Component Value Range Status Comment   Specimen Description BLOOD LEFT ARM   Final    Special Requests BOTTLES DRAWN AEROBIC ONLY 4CC   Final    Culture  Setup Time 10/09/2012 02:11   Final    Culture     Final    Value:        BLOOD CULTURE RECEIVED NO GROWTH TO DATE CULTURE WILL BE HELD FOR 5 DAYS BEFORE ISSUING A FINAL NEGATIVE REPORT   Report Status PENDING   Incomplete   URINE CULTURE     Status: Normal   Collection Time   10/08/12  5:48 PM      Component Value Range Status Comment   Specimen Description URINE, CLEAN CATCH   Final    Special Requests NONE   Final    Culture  Setup Time 10/08/2012 18:42   Final    Colony Count NO GROWTH   Final    Culture NO GROWTH   Final    Report Status 10/09/2012 FINAL   Final   CULTURE, BLOOD (ROUTINE X 2)     Status: Normal (Preliminary result)   Collection Time   10/08/12  6:00 PM      Component Value Range Status Comment   Specimen Description BLOOD LEFT FOOT   Final    Special Requests BOTTLES DRAWN AEROBIC ONLY 1CC   Final    Culture  Setup Time 10/09/2012 02:11   Final    Culture     Final    Value:        BLOOD CULTURE RECEIVED NO GROWTH TO DATE CULTURE WILL BE  HELD FOR 5 DAYS BEFORE ISSUING A FINAL NEGATIVE REPORT   Report Status PENDING   Incomplete   CSF CULTURE     Status: Normal (Preliminary result)   Collection Time   10/08/12  9:58 PM      Component Value Range Status Comment   Specimen Description CSF   Final    Special Requests NONE   Final    Gram Stain      Final    Value: WBC PRESENT,BOTH PMN AND MONONUCLEAR     NO ORGANISMS SEEN     CYTOSPIN SLIDE Performed at [facility]   Culture NO GROWTH 1 DAY   Final    Report Status PENDING   Incomplete   GRAM STAIN     Status: Normal   Collection Time   10/08/12  9:58 PM      Component Value Range Status Comment   Specimen Description CSF   Final    Special Requests NO 2 1CC   Final    Gram Stain     Final    Value: CYTOSPIN SLIDE     WBC PRESENT,BOTH PMN AND MONONUCLEAR     NO ORGANISMS SEEN   Report Status 10/08/2012 FINAL   Final   MRSA PCR SCREENING     Status: Normal   Collection Time   10/09/12  4:22 AM      Component Value Range Status Comment   MRSA by PCR NEGATIVE  NEGATIVE Final     Studies/Results: Ct Head Wo Contrast  10/08/2012  *RADIOLOGY REPORT*  Clinical Data: Fever, unresponsive.  CT HEAD WITHOUT CONTRAST  Technique:  Contiguous axial images were obtained from the base of the skull through the vertex without contrast.  Comparison: 05/14/2012  Findings: Atherosclerotic and physiologic intracranial calcifications.  Motion degraded many images, requiring rescanning. Diffuse parenchymal atrophy. Patchy areas of hypoattenuation in deep and periventricular white matter bilaterally.  Prominence of lateral and third ventricles as before.  Negative for acute intracranial hemorrhage, mass lesion, acute infarction, midline shift, or mass-effect. Acute infarct may be inapparent on noncontrast CT. Ventricles and sulci symmetric. Bone windows demonstrate no focal lesion.  IMPRESSION:  1. Negative for bleed or other acute intracranial process.  2. Atrophy and nonspecific white matter changes.   Original Report Authenticated By: D. Andria Rhein, MD    Dg Chest Portable 1 View  10/08/2012  *RADIOLOGY REPORT*  Clinical Data: CPR, fever.  PORTABLE CHEST - 1 VIEW  Comparison: 09/02/2012  Findings: Stable cardiomegaly.  Atheromatous aorta.  Some increase in mild interstitial edema and pulmonary  vascular congestion. Persistent blunting of the left lateral costophrenic angle. Regional bones unremarkable.  IMPRESSION:  1.  Stable cardiomegaly with some increase in mild interstitial edema.   Original Report Authenticated By: D. Andria Rhein, MD      Assessment/Plan: ? Meningitis Sepsis Total days of antibiotics:  3 Amp/Vanco/Ceftriaxone/Decadron WBC up, probably due to decadron. Afebrile (could be due to decadron as well).  Await CSF Cx. BCx are NGTD. UCx is negative final.  Off isolation after 48h anbx?   [name] Infectious Diseases [phone] 10/10/2012, 2:25 PM   LOS: 2 days

## 2012-10-10 NOTE — Progress Notes (Signed)
[facility] Progress Note [place] TEAM 1 - Stepdown/ICU TEAM   [name] WUX:[id number] DOB: May 29, 1930 DOA: 10/08/2012 PCP: Cain Saupe, MD  Brief narrative: 79 year old female resident of assisted living with history of chronic diastole CHF on supplemental 02, temporal arteritis on chronic steroids, hypothyroidism, mild dementia, with recent hospitalization for acute on chronic respiratory failure with? pneumonia who was brought in by family after she was found to have acute change in mental status. EMS arrived and noted she was febrile at 10 8F and also noted some seizure-like activity (with a short duration of generalized tonic-clonic seizure as per the ED).  Her GCS was 3 on arrival to the ED and she required BiPAP for acute respiratory distress. She was tachycardic and hypoxic with sats in 60s and cyanotic on arrival. Given symptoms of severe sepsis, workup was initiated including head CT , EKG and labs. Blood work showed significant leukocytosis of 23,000 with an elevated lactate of 5.3 and an elevated troponin greater than 1. Given concern for meningitis an LP was done in the ED which showed significant WBC elevation, elevated protein, and elevated glucose. She was given a dose of IV vancomycin, Zosyn, and ceftriaxone along with 500 cc normal saline. Given CSF findings, a dose of IV ampicillin was also given. Her mental status did improve with GCS of 14 and she was weaned off BiPAP and placed on nonrebreather. A repeat lactic acid improved to 2. Her EKG showed right bundle branch block with diffuse T-wave inversion. Patient became oriented to place and person on evaluation however remained quite lethargic.  Of note, patient was admitted to the hospital 1 month back for acute respiratory distress and? pneumonia.   Assessment/Plan:  ?Bacterial meningitis Remains on empiric abx as per ID service - clinically much improved - no cx date at this time  Encephalopathy EEG pending -  pt now back to baseline mental status - suspect this is due to meningitis in setting of baseline dementia  Demand ischemia  Troponin is trending down - Cardiology is following   Acute resp failure Resolved - due to meningitis related sepsis in setting of diastolic chf   Acute renal failure Renal function improving nicely - cont to follow   Chronic diastolic CHF Holding on further diuresis at this time - no indication clinically of volume overload today   HTN BP trending upward - will increase Norvasc dose and follow trend  Hypokalemia Replete - check Mg in AM  Temporal arteritis on chronic steroid tx Will need to taper back to baseline dose as pt improves clinically   Hypothyroidism TSH is significantly suppressed, and was suppressed 09/03/12 as well - hold synthroid for 72hrs then resume at lowered dose  Mild dementia Cont usual outpt meds  Recent pneumonia  Code Status: DNR/NO CODE BLUE Family Communication: spoke w/ pt and family at bedside Disposition Plan: transfer to tele bed  Consultants: ID Cardiology - [place]  Antibiotics: rocephin 1/14 >> ampicillin 1/14 >> Vancomycin 1/14 >>  DVT prophylaxis: Sub Q heparin  HPI/Subjective: Pt is much more alert and conversant today.  She denies ha, n/v, abdom pain, or cp.  She is very hungry, and motivated to be d/c from the hospital.   Objective: Blood pressure 141/55, pulse 89, temperature 99.1 F (37.3 C), temperature source Core (Comment), resp. rate 21, height 5\' 9"  (1.753 m), weight 89.4 kg (197 lb 1.5 oz), SpO2 97.00%.  Intake/Output Summary (Last 24 hours) at 10/10/12 1356 Last data filed at 10/10/12  1300  Gross per 24 hour  Intake   1235 ml  Output   2150 ml  Net   -915 ml     Exam: General: No acute respiratory distress Lungs: Clear to auscultation bilaterally without wheezes or crackles Cardiovascular: Regular rate and rhythm without murmur gallop or rub normal S1 and S2 Abdomen: Nontender,  nondistended, soft, bowel sounds positive, no rebound, no ascites, no appreciable mass Extremities: No significant cyanosis, clubbing, or edema bilateral lower extremities  Data Reviewed: Basic Metabolic Panel:  Lab 10/10/12 9604 10/09/12 0455 10/08/12 1723  NA 143 138 136  K 3.0* 3.9 3.9  CL 100 99 91*  CO2 29 28 21   GLUCOSE 143* 149* 241*  BUN 18 15 15   CREATININE 1.11* 1.39* 1.56*  CALCIUM 8.9 8.8 9.5  MG -- -- --  PHOS -- -- --   Liver Function Tests:  Lab 10/09/12 0455 10/08/12 1723  AST 40* 30  ALT 22 21  ALKPHOS 71 85  BILITOT 0.3 0.2*  PROT 6.7 7.5  ALBUMIN 2.7* 3.2*   CBC:  Lab 10/10/12 0859 10/09/12 0700 10/08/12 1723  WBC 18.1* 12.8* 23.8*  NEUTROABS -- 12.2* 11.9*  HGB 10.6* 11.9* 12.5  HCT 32.1* 37.1 45.0  MCV 91.7 92.1 111.4*  PLT 218 205 231   Cardiac Enzymes:  Lab 10/09/12 1712 10/09/12 1003 10/09/12 0500 10/08/12 2158  CKTOTAL -- -- -- --  CKMB -- -- -- --  CKMBINDEX -- -- -- --  TROPONINI 1.35* 1.83* 2.33* 1.18*   BNP (last 3 results)  Basename 10/08/12 1723 09/03/12 0644 09/02/12 1228  PROBNP 1356.0* 936.0* 1222.0*   CBG:  Lab 10/10/12 1121 10/10/12 0805 10/09/12 2147 10/09/12 1629 10/09/12 1147  GLUCAP 154* 138* 139* 143* 167*    Recent Results (from the past 240 hour(s))  CULTURE, BLOOD (ROUTINE X 2)     Status: Normal (Preliminary result)   Collection Time   10/08/12  5:45 PM      Component Value Range Status Comment   Specimen Description BLOOD LEFT ARM   Final    Special Requests BOTTLES DRAWN AEROBIC ONLY 4CC   Final    Culture  Setup Time 10/09/2012 02:11   Final    Culture     Final    Value:        BLOOD CULTURE RECEIVED NO GROWTH TO DATE CULTURE WILL BE HELD FOR 5 DAYS BEFORE ISSUING A FINAL NEGATIVE REPORT   Report Status PENDING   Incomplete   URINE CULTURE     Status: Normal   Collection Time   10/08/12  5:48 PM      Component Value Range Status Comment   Specimen Description URINE, CLEAN CATCH   Final    Special  Requests NONE   Final    Culture  Setup Time 10/08/2012 18:42   Final    Colony Count NO GROWTH   Final    Culture NO GROWTH   Final    Report Status 10/09/2012 FINAL   Final   CULTURE, BLOOD (ROUTINE X 2)     Status: Normal (Preliminary result)   Collection Time   10/08/12  6:00 PM      Component Value Range Status Comment   Specimen Description BLOOD LEFT FOOT   Final    Special Requests BOTTLES DRAWN AEROBIC ONLY 1CC   Final    Culture  Setup Time 10/09/2012 02:11   Final    Culture     Final  Value:        BLOOD CULTURE RECEIVED NO GROWTH TO DATE CULTURE WILL BE HELD FOR 5 DAYS BEFORE ISSUING A FINAL NEGATIVE REPORT   Report Status PENDING   Incomplete   CSF CULTURE     Status: Normal (Preliminary result)   Collection Time   10/08/12  9:58 PM      Component Value Range Status Comment   Specimen Description CSF   Final    Special Requests NONE   Final    Gram Stain     Final    Value: WBC PRESENT,BOTH PMN AND MONONUCLEAR     NO ORGANISMS SEEN     CYTOSPIN SLIDE Performed at [facility]   Culture NO GROWTH 1 DAY   Final    Report Status PENDING   Incomplete   GRAM STAIN     Status: Normal   Collection Time   10/08/12  9:58 PM      Component Value Range Status Comment   Specimen Description CSF   Final    Special Requests NO 2 1CC   Final    Gram Stain     Final    Value: CYTOSPIN SLIDE     WBC PRESENT,BOTH PMN AND MONONUCLEAR     NO ORGANISMS SEEN   Report Status 10/08/2012 FINAL   Final   MRSA PCR SCREENING     Status: Normal   Collection Time   10/09/12  4:22 AM      Component Value Range Status Comment   MRSA by PCR NEGATIVE  NEGATIVE Final      Studies:  Recent x-ray studies have been reviewed in detail by the Attending Physician  Scheduled Meds:  Reviewed in detail by the Attending Physician   Lonia Blood, MD [facility] Office  [phone] Pager [phone]  On-Call/Text Page:      [email or web address]      password [email or web address]  If 7PM-7AM,  please contact night-coverage [email or web address] Password [email or web address] 10/10/2012, 1:56 PM   LOS: 2 days

## 2012-10-10 NOTE — Progress Notes (Signed)
Cardiology Progress Note Patient Name: [name] Date of Encounter: 10/10/2012, 9:00 AM     Subjective  Patient awake, alert, and without complaints of chest pain or sob.   Objective   Telemetry: sinus rhythm 80-90s  Medications: . amLODipine  5 mg Oral Daily  . ampicillin (OMNIPEN) IV  2 g Intravenous Q4H  . antiseptic oral rinse  15 mL Mouth Rinse QID  . cefTRIAXone (ROCEPHIN)  IV  2 g Intravenous Q12H  . dexamethasone  6 mg Intravenous Q6H  . dipyridamole-aspirin  1 capsule Oral BID  . donepezil  10 mg Oral QHS  . heparin  5,000 Units Subcutaneous Q8H  . insulin aspart  0-15 Units Subcutaneous TID WC  . levothyroxine  175 mcg Oral QAC breakfast  . multivitamin with minerals  1 tablet Oral Daily  . pantoprazole (PROTONIX) IV  40 mg Intravenous QHS  . psyllium  1 packet Oral Daily  . saccharomyces boulardii  250 mg Oral BID  . sertraline  50 mg Oral Daily  . simvastatin  20 mg Oral QHS  . sodium chloride  3 mL Intravenous Q12H  . vancomycin  1,250 mg Intravenous Q24H   . sodium chloride 10 mL/hr at 10/09/12 1900    Physical Exam: Temp:  [98.6 F (37 C)-99.9 F (37.7 C)] 99.1 F (37.3 C) (01/15 0800) Pulse Rate:  [30-102] 88  (01/15 0800) Resp:  [13-26] 16  (01/15 0800) BP: (101-154)/(42-62) 143/57 mmHg (01/15 0800) SpO2:  [64 %-100 %] 94 % (01/15 0800)  General: Pleasantly confused elderly white female, in no acute distress. Head: Normocephalic, atraumatic, sclera non-icteric, nares are without discharge.  Neck: Supple. No JVD Lungs: Clear bilaterally to auscultation without wheezes, rales, or rhonchi. Breathing is unlabored. Heart: RRR S1 S2 without murmurs, rubs, or gallops.  Abdomen: Soft, non-tender, non-distended with normoactive bowel sounds. No rebound/guarding. No obvious abdominal masses. Msk:  Strength and tone appear normal for age. Extremities: Scattered ecchymosis with dressings to BUE. No edema. No clubbing or cyanosis. Distal pedal  pulses are intact and equal bilaterally. Neuro: Alert and oriented to person. Moves all extremities spontaneously. Psych:  Responds to questions appropriately with a normal affect.   Intake/Output Summary (Last 24 hours) at 10/10/12 0900 Last data filed at 10/10/12 0700  Gross per 24 hour  Intake   1465 ml  Output   2150 ml  Net   -685 ml    Labs:  [facility] 10/09/12 0455 10/08/12 1723  NA 138 136  K 3.9 3.9  CL 99 91*  CO2 28 21  GLUCOSE 149* 241*  BUN 15 15  CREATININE 1.39* 1.56*  CALCIUM 8.8 9.5  MG -- --  PHOS -- --   Basename 10/09/12 0455 10/08/12 1723  AST 40* 30  ALT 22 21  ALKPHOS 71 85  BILITOT 0.3 0.2*  PROT 6.7 7.5  ALBUMIN 2.7* 3.2*    Basename 10/09/12 0700 10/08/12 1723  WBC 12.8* 23.8*  NEUTROABS 12.2* 11.9*  HGB 11.9* 12.5  HCT 37.1 45.0  MCV 92.1 111.4*  PLT 205 231   Basename 10/09/12 1712 10/09/12 1003 10/09/12 0500 10/08/12 2158  TROPONINI 1.35* 1.83* 2.33* 1.18*   Basename 10/09/12 0455  TSH 0.023*    Radiology/Studies:   10/08/2012 - CT HEAD WITHOUT CONTRAST  Findings: Atherosclerotic and physiologic intracranial calcifications.  Motion degraded many images, requiring rescanning. Diffuse parenchymal atrophy. Patchy areas of hypoattenuation in deep and periventricular white matter bilaterally.  Prominence of  lateral and third ventricles as before.  Negative for acute intracranial hemorrhage, mass lesion, acute infarction, midline shift, or mass-effect. Acute infarct may be inapparent on noncontrast CT. Ventricles and sulci symmetric. Bone windows demonstrate no focal lesion.  IMPRESSION:  1. Negative for bleed or other acute intracranial process.  2. Atrophy and nonspecific white matter changes.      10/08/2012 - PORTABLE CHEST - 1 VIEW   Findings: Stable cardiomegaly.  Atheromatous aorta.  Some increase in mild interstitial edema and pulmonary vascular congestion. Persistent blunting of the left lateral costophrenic angle. Regional bones  unremarkable.  IMPRESSION:  1.  Stable cardiomegaly with some increase in mild interstitial edema.       Assessment and Plan   1. Meningitis: CSF Cx pending. On abx. Management per primary team and ID. 2. Sepsis: due to above.  3. Encephalopathy: In the setting of meningitis and sepsis. Improving. Has baseline dementia. EEG results pending. 4. Acute on Chronic Respiratory Failure: In the setting of #1-2. Initially required BiPAP. Oxygenating well on [place] (wears chronic O2).   5. Chronic Diastolic CHF: EF 09-81%. Elevated BNP (1356) and CXR with mild edema. No overt signs of volume overload. Would hold diuretics at this point with ARF and be conservative with IVF. Daily weights and strict I/Os.  6. Elevated Troponin: Felt to be demand ischemia in the setting of sepsis. EKG without acute ischemic changes. Trending down. No anginal symptoms. Cont ASA. 7. Acute Renal Failure: Crt improving. BMET pending this am. 8. Decreased TSH: TSH 0.023. On Synthroid. Free T4/5 pending.  Signed, Cassell Clement MD Agree with above assessment by [facility], PA-C.  The patient is much improved this am.  No chest pain. Oxygenating well on nasal O2. No chest pain or dyspnea. Rhythm stable NSR. Plan: as above.

## 2012-10-11 DIAGNOSIS — I509 Heart failure, unspecified: Secondary | ICD-10-CM

## 2012-10-11 DIAGNOSIS — R7989 Other specified abnormal findings of blood chemistry: Secondary | ICD-10-CM

## 2012-10-11 LAB — BASIC METABOLIC PANEL
Chloride: 102 mEq/L (ref 96–112)
Creatinine, Ser: 1.07 mg/dL (ref 0.50–1.10)
GFR calc Af Amer: 55 mL/min — ABNORMAL LOW (ref >90)
Potassium: 4.3 mEq/L (ref 3.5–5.1)
Sodium: 142 mEq/L (ref 135–145)

## 2012-10-11 LAB — GLUCOSE, CAPILLARY
Glucose-Capillary: 144 mg/dL — ABNORMAL HIGH (ref 70–99)
Glucose-Capillary: 127 mg/dL — ABNORMAL HIGH (ref 70–99)

## 2012-10-11 LAB — CBC
HCT: 30.8 % — ABNORMAL LOW (ref 36.0–46.0)
Hemoglobin: 10.1 g/dL — ABNORMAL LOW (ref 12.0–15.0)
RDW: 14.5 % (ref 11.5–15.5)
WBC: 13.9 10*3/uL — ABNORMAL HIGH (ref 4.0–10.5)

## 2012-10-11 MED ORDER — ASPIRIN EC 325 MG PO TBEC: 325.0000 mg | DELAYED_RELEASE_TABLET | Freq: Every day | ORAL | Status: DC

## 2012-10-11 MED ORDER — ASPIRIN EC 81 MG PO TBEC: 81.0000 mg | DELAYED_RELEASE_TABLET | Freq: Every day | ORAL | Status: DC

## 2012-10-11 NOTE — Progress Notes (Signed)
[facility]             Progress Note   Subjective: Feels much better. Is alert and oriented.  Objective: Vital signs in last 24 hours: Temp:  [98.3 F (36.8 C)-99.1 F (37.3 C)] 98.4 F (36.9 C) (01/15 2012) Pulse Rate:  [69-90] 78  (01/15 2012) Resp:  [17-24] 20  (01/15 2012) BP: (103-146)/(51-75) 127/51 mmHg (01/15 2012) SpO2:  [90 %-100 %] 94 % (01/15 2012) Weight change:  Last BM Date: 10/10/12  Intake/Output from previous day: 01/15 0701 - 01/16 0700 In: 360 [P.O.:240; I.V.:120] Out: 1400 [Urine:1400]     Physical Exam: General: Alert, awake, oriented x3, in no acute distress. HEENT: No bruits, no goiter. Heart: Regular rate and rhythm, without murmurs, rubs, gallops. Lungs: Clear to auscultation bilaterally. Abdomen: Soft, nontender, nondistended, positive bowel sounds. Extremities: No clubbing cyanosis or edema with positive pedal pulses. Neuro: Grossly intact, nonfocal.    Lab Results: Basic Metabolic Panel:  Basename 10/11/12 0615 10/10/12 0859  NA 142 143  K 4.3 3.0*  CL 102 100  CO2 30 29  GLUCOSE 124* 143*  BUN 23 18  CREATININE 1.07 1.11*  CALCIUM 8.9 8.9  MG 2.2 --  PHOS -- --   Liver Function Tests:  [facility] 10/09/12 0455 10/08/12 1723  AST 40* 30  ALT 22 21  ALKPHOS 71 85  BILITOT 0.3 0.2*  PROT 6.7 7.5  ALBUMIN 2.7* 3.2*   CBC:  Basename 10/11/12 0615 10/10/12 0859 10/09/12 0700 10/08/12 1723  WBC 13.9* 18.1* -- --  NEUTROABS -- -- 12.2* 11.9*  HGB 10.1* 10.6* -- --  HCT 30.8* 32.1* -- --  MCV 91.7 91.7 -- --  PLT 206 218 -- --   Cardiac Enzymes:  Basename 10/09/12 1712 10/09/12 1003 10/09/12 0500  CKTOTAL -- -- --  CKMB -- -- --  CKMBINDEX -- -- --  TROPONINI 1.35* 1.83* 2.33*   BNP:  Basename 10/08/12 1723  PROBNP 1356.0*   CBG:  Basename 10/11/12 0702 10/10/12 2113 10/10/12 1651 10/10/12 1121 10/10/12 0805 10/09/12 2147  GLUCAP 118* 144* 116* 154* 138* 139*   Thyroid Function  Tests:  Basename 10/10/12 0859 10/09/12 0455  TSH -- 0.023*  T4TOTAL -- --  FREET4 1.77 --  T3FREE 1.9* --  THYROIDAB -- --   Coagulation:  Basename 10/08/12 1723  LABPROT 13.5  INR 1.04   Urinalysis:  Basename 10/08/12 1747  COLORURINE YELLOW  LABSPEC 1.010  PHURINE 7.5  GLUCOSEU NEGATIVE  HGBUR MODERATE*  BILIRUBINUR NEGATIVE  KETONESUR NEGATIVE  PROTEINUR 100*  UROBILINOGEN 0.2  NITRITE NEGATIVE  LEUKOCYTESUR NEGATIVE    Recent Results (from the past 240 hour(s))  CULTURE, BLOOD (ROUTINE X 2)     Status: Normal (Preliminary result)   Collection Time   10/08/12  5:45 PM      Component Value Range Status Comment   Specimen Description BLOOD LEFT ARM   Final    Special Requests BOTTLES DRAWN AEROBIC ONLY 4CC   Final    Culture  Setup Time 10/09/2012 02:11   Final    Culture     Final    Value:        BLOOD CULTURE RECEIVED NO GROWTH TO DATE CULTURE WILL BE HELD FOR 5 DAYS BEFORE ISSUING A FINAL NEGATIVE REPORT   Report Status PENDING   Incomplete   URINE CULTURE     Status: Normal   Collection Time   10/08/12  5:48 PM  Component Value Range Status Comment   Specimen Description URINE, CLEAN CATCH   Final    Special Requests NONE   Final    Culture  Setup Time 10/08/2012 18:42   Final    Colony Count NO GROWTH   Final    Culture NO GROWTH   Final    Report Status 10/09/2012 FINAL   Final   CULTURE, BLOOD (ROUTINE X 2)     Status: Normal (Preliminary result)   Collection Time   10/08/12  6:00 PM      Component Value Range Status Comment   Specimen Description BLOOD LEFT FOOT   Final    Special Requests BOTTLES DRAWN AEROBIC ONLY 1CC   Final    Culture  Setup Time 10/09/2012 02:11   Final    Culture     Final    Value:        BLOOD CULTURE RECEIVED NO GROWTH TO DATE CULTURE WILL BE HELD FOR 5 DAYS BEFORE ISSUING A FINAL NEGATIVE REPORT   Report Status PENDING   Incomplete   CSF CULTURE     Status: Normal (Preliminary result)   Collection Time   10/08/12   9:58 PM      Component Value Range Status Comment   Specimen Description CSF   Final    Special Requests NONE   Final    Gram Stain     Final    Value: WBC PRESENT,BOTH PMN AND MONONUCLEAR     NO ORGANISMS SEEN     CYTOSPIN SLIDE Performed at [facility]   Culture NO GROWTH 1 DAY   Final    Report Status PENDING   Incomplete   GRAM STAIN     Status: Normal   Collection Time   10/08/12  9:58 PM      Component Value Range Status Comment   Specimen Description CSF   Final    Special Requests NO 2 1CC   Final    Gram Stain     Final    Value: CYTOSPIN SLIDE     WBC PRESENT,BOTH PMN AND MONONUCLEAR     NO ORGANISMS SEEN   Report Status 10/08/2012 FINAL   Final   MRSA PCR SCREENING     Status: Normal   Collection Time   10/09/12  4:22 AM      Component Value Range Status Comment   MRSA by PCR NEGATIVE  NEGATIVE Final     Studies/Results: No results found.  Medications: Scheduled Meds:   . amLODipine  10 mg Oral Daily  . ampicillin (OMNIPEN) IV  2 g Intravenous Q4H  . antiseptic oral rinse  15 mL Mouth Rinse QID  . cefTRIAXone (ROCEPHIN)  IV  2 g Intravenous Q12H  . dexamethasone  6 mg Intravenous Q6H  . dipyridamole-aspirin  1 capsule Oral BID  . donepezil  10 mg Oral QHS  . heparin  5,000 Units Subcutaneous Q8H  . insulin aspart  0-15 Units Subcutaneous TID WC  . levothyroxine  150 mcg Oral QAC breakfast  . multivitamin with minerals  1 tablet Oral Daily  . pantoprazole  40 mg Oral Daily  . potassium chloride  40 mEq Oral BID  . psyllium  1 packet Oral Daily  . saccharomyces boulardii  250 mg Oral BID  . sertraline  50 mg Oral Daily  . simvastatin  20 mg Oral QHS  . sodium chloride  3 mL Intravenous Q12H  . vancomycin  1,250 mg Intravenous Q24H  Continuous Infusions:   . sodium chloride Stopped (10/10/12 1317)   PRN Meds:.sodium chloride, acetaminophen, acetaminophen, hydrALAZINE  Assessment/Plan:  Principal Problem:  *Severe sepsis Active Problems:   Meningitis, bacterial  Altered mental status  Hypothyroidism  Diastolic CHF  Temporal arteritis  Hypertension  Acute respiratory failure  ARF (acute renal failure)  Demand ischemia of myocardium   ?Bacterial meningitis  -Remains on empiric abx as per ID service (vanc/rocephin/ampicillin)  - clinically much improved  - cx data is negative to date.   Encephalopathy  EEG pending - pt now back to baseline mental status - suspect this is due to meningitis in setting of baseline dementia   Demand ischemia  -Troponin is trending down  - Cardiology is following  -Likely 2/2 severe sepsis in setting of probable bacterial meningitis.  Acute resp failure  -Resolved  - due to meningitis related sepsis in setting of diastolic chf   Acute renal failure  -Renal function improving nicely - cont to follow   Chronic diastolic CHF  -Holding on further diuresis at this time - no indication clinically of volume overload today   HTN  -Well controlled on increased dose of norvasc.  Hypokalemia  -Repleted  -Mg ok at 2.2  Temporal arteritis on chronic steroid tx  -Will need to taper back to baseline dose as pt improves clinically   Hypothyroidism  -TSH is significantly suppressed. -Dose has been decreased to 150 mcg daily.  Mild dementia  -Cont usual outpt meds     Time spent coordinating care: 35 minutes.   LOS: 3 days   HERNANDEZ ACOSTA,ESTELA [facility] Pager: [phone] 10/11/2012, 8:16 AM

## 2012-10-11 NOTE — Progress Notes (Signed)
INFECTIOUS DISEASE PROGRESS NOTE  ID: [name] is a 79 y.o. female with   Principal Problem:  *Severe sepsis Active Problems:  Altered mental status  Hypothyroidism  Diastolic CHF  Temporal arteritis  Hypertension  Acute respiratory failure  ARF (acute renal failure)  Meningitis, bacterial  Demand ischemia of myocardium  Subjective: Without complaints  Abtx:  Anti-infectives     Start     Dose/Rate Route Frequency Ordered Stop   10/09/12 1600   ampicillin (OMNIPEN) 2 g in sodium chloride 0.9 % 50 mL IVPB        2 g 150 mL/hr over 20 Minutes Intravenous 6 times per day 10/09/12 1356     10/09/12 1300   cefTRIAXone (ROCEPHIN) 2 g in dextrose 5 % 50 mL IVPB        2 g 100 mL/hr over 30 Minutes Intravenous Every 12 hours 10/09/12 1202     10/09/12 0600   ampicillin (OMNIPEN) 1 g in sodium chloride 0.9 % 50 mL IVPB  Status:  Discontinued        1 g 150 mL/hr over 20 Minutes Intravenous 4 times per day 10/09/12 0239 10/09/12 1356   10/09/12 0600   vancomycin (VANCOCIN) IVPB 1000 mg/200 mL premix  Status:  Discontinued        1,000 mg 200 mL/hr over 60 Minutes Intravenous Every 48 hours 10/09/12 0305 10/09/12 0811   10/09/12 0600   ceFEPIme (MAXIPIME) 2 g in dextrose 5 % 50 mL IVPB  Status:  Discontinued        2 g 100 mL/hr over 30 Minutes Intravenous 2 times daily 10/09/12 0305 10/09/12 1202   10/09/12 0600   vancomycin (VANCOCIN) 1,250 mg in sodium chloride 0.9 % 250 mL IVPB        1,250 mg 166.7 mL/hr over 90 Minutes Intravenous Every 24 hours 10/09/12 0811     10/08/12 2330   ampicillin (OMNIPEN) 2 g in sodium chloride 0.9 % 50 mL IVPB        2 g 150 mL/hr over 20 Minutes Intravenous  Once 10/08/12 2320 10/09/12 0041   10/08/12 1800   vancomycin (VANCOCIN) 1,250 mg in sodium chloride 0.9 % 250 mL IVPB     Comments: (15mg /kg)      1,250 mg 166.7 mL/hr over 90 Minutes Intravenous  Once 10/08/12 1736 10/08/12 2025   10/08/12 1745   cefTRIAXone (ROCEPHIN)  2 g in dextrose 5 % 50 mL IVPB        2 g 100 mL/hr over 30 Minutes Intravenous  Once 10/08/12 1736 10/08/12 2122   10/08/12 1745  piperacillin-tazobactam (ZOSYN) IVPB 3.375 g       3.375 g 100 mL/hr over 30 Minutes Intravenous  Once 10/08/12 1736 10/08/12 2236          Medications:  Scheduled:   . amLODipine  10 mg Oral Daily  . ampicillin (OMNIPEN) IV  2 g Intravenous Q4H  . antiseptic oral rinse  15 mL Mouth Rinse QID  . cefTRIAXone (ROCEPHIN)  IV  2 g Intravenous Q12H  . dexamethasone  6 mg Intravenous Q6H  . dipyridamole-aspirin  1 capsule Oral BID  . donepezil  10 mg Oral QHS  . heparin  5,000 Units Subcutaneous Q8H  . insulin aspart  0-15 Units Subcutaneous TID WC  . levothyroxine  150 mcg Oral QAC breakfast  . multivitamin with minerals  1 tablet Oral Daily  . pantoprazole  40 mg Oral Daily  .  potassium chloride  40 mEq Oral BID  . psyllium  1 packet Oral Daily  . saccharomyces boulardii  250 mg Oral BID  . sertraline  50 mg Oral Daily  . simvastatin  20 mg Oral QHS  . sodium chloride  3 mL Intravenous Q12H  . vancomycin  1,250 mg Intravenous Q24H    Objective: Vital signs in last 24 hours: Temp:  [98.3 F (36.8 C)-99.1 F (37.3 C)] 98.4 F (36.9 C) (01/15 2012) Pulse Rate:  [69-90] 78  (01/15 2012) Resp:  [17-24] 20  (01/15 2012) BP: (103-141)/(51-75) 127/51 mmHg (01/15 2012) SpO2:  [90 %-100 %] 94 % (01/15 2012)   General appearance: alert, appears stated age and no distress Neck: FROM, NT Resp: clear to auscultation bilaterally Cardio: regular rate and rhythm GI: normal findings: bowel sounds normal and soft, non-tender  Lab Results  Basename 10/11/12 0615 10/10/12 0859  WBC 13.9* 18.1*  HGB 10.1* 10.6*  HCT 30.8* 32.1*  NA 142 143  K 4.3 3.0*  CL 102 100  CO2 30 29  BUN 23 18  CREATININE 1.07 1.11*  GLU -- --   Liver Panel  Basename 10/09/12 0455 10/08/12 1723  PROT 6.7 7.5  ALBUMIN 2.7* 3.2*  AST 40* 30  ALT 22 21  ALKPHOS 71 85   BILITOT 0.3 0.2*  BILIDIR -- --  IBILI -- --   Sedimentation Rate No results found for this basename: ESRSEDRATE in the last 72 hours C-Reactive Protein No results found for this basename: CRP:2 in the last 72 hours  Microbiology: Recent Results (from the past 240 hour(s))  CULTURE, BLOOD (ROUTINE X 2)     Status: Normal (Preliminary result)   Collection Time   10/08/12  5:45 PM      Component Value Range Status Comment   Specimen Description BLOOD LEFT ARM   Final    Special Requests BOTTLES DRAWN AEROBIC ONLY 4CC   Final    Culture  Setup Time 10/09/2012 02:11   Final    Culture     Final    Value:        BLOOD CULTURE RECEIVED NO GROWTH TO DATE CULTURE WILL BE HELD FOR 5 DAYS BEFORE ISSUING A FINAL NEGATIVE REPORT   Report Status PENDING   Incomplete   URINE CULTURE     Status: Normal   Collection Time   10/08/12  5:48 PM      Component Value Range Status Comment   Specimen Description URINE, CLEAN CATCH   Final    Special Requests NONE   Final    Culture  Setup Time 10/08/2012 18:42   Final    Colony Count NO GROWTH   Final    Culture NO GROWTH   Final    Report Status 10/09/2012 FINAL   Final   CULTURE, BLOOD (ROUTINE X 2)     Status: Normal (Preliminary result)   Collection Time   10/08/12  6:00 PM      Component Value Range Status Comment   Specimen Description BLOOD LEFT FOOT   Final    Special Requests BOTTLES DRAWN AEROBIC ONLY 1CC   Final    Culture  Setup Time 10/09/2012 02:11   Final    Culture     Final    Value:        BLOOD CULTURE RECEIVED NO GROWTH TO DATE CULTURE WILL BE HELD FOR 5 DAYS BEFORE ISSUING A FINAL NEGATIVE REPORT   Report Status PENDING  Incomplete   CSF CULTURE     Status: Normal (Preliminary result)   Collection Time   10/08/12  9:58 PM      Component Value Range Status Comment   Specimen Description CSF   Final    Special Requests NONE   Final    Gram Stain     Final    Value: WBC PRESENT,BOTH PMN AND MONONUCLEAR     NO ORGANISMS SEEN       CYTOSPIN SLIDE Performed at [facility]   Culture NO GROWTH 1 DAY   Final    Report Status PENDING   Incomplete   GRAM STAIN     Status: Normal   Collection Time   10/08/12  9:58 PM      Component Value Range Status Comment   Specimen Description CSF   Final    Special Requests NO 2 1CC   Final    Gram Stain     Final    Value: CYTOSPIN SLIDE     WBC PRESENT,BOTH PMN AND MONONUCLEAR     NO ORGANISMS SEEN   Report Status 10/08/2012 FINAL   Final   MRSA PCR SCREENING     Status: Normal   Collection Time   10/09/12  4:22 AM      Component Value Range Status Comment   MRSA by PCR NEGATIVE  NEGATIVE Final     Studies/Results: No results found.   Assessment/Plan: Meningitis Day 4 anbx (amp/ceftriaxone/vanco/decadron)  Would aim for 10 days of therapy if Cx are negative (Cx neg so far per lab) Will d/c isolation, d/i IP   [name] Infectious Diseases [phone] 10/11/2012, 10:26 AM   LOS: 3 days

## 2012-10-11 NOTE — Progress Notes (Addendum)
Nutrition Brief Note  Patient identified on the Malnutrition Screening Tool (MST) Report.  Patient reports a good appetite and no recent weight loss.  Wt Readings from Last 10 Encounters:  10/09/12 197 lb 1.5 oz (89.4 kg)  09/07/12 188 lb 11.4 oz (85.6 kg)  06/25/12 197 lb (89.359 kg)  05/30/12 199 lb (90.266 kg)  05/24/12 204 lb 12.8 oz (92.897 kg)  05/17/12 190 lb 14.4 oz (86.592 kg)  03/08/12 202 lb 2.6 oz (91.7 kg)  09/28/11 200 lb (90.719 kg)    Body mass index is 29.11 kg/(m^2).  Patient meets criteria for Overweight based on current BMI.   Current diet order is Carbohydrate Modified Medium Calorie.  Labs and medications reviewed.   No nutrition interventions warranted at this time. Please consult RD as needed.  Maureen Chatters, RD, LDN Pager #: [phone] After-Hours Pager #: [phone]

## 2012-10-11 NOTE — Progress Notes (Signed)
Patient Name: [name] Date of Encounter: 10/11/2012  Principal Problem:  *Severe sepsis Active Problems:  Altered mental status  Hypothyroidism  Diastolic CHF  Temporal arteritis  Hypertension  Acute respiratory failure  ARF (acute renal failure)  Meningitis, bacterial  Demand ischemia of myocardium    SUBJECTIVE:  Breathing better today, no problem lying flat on O2 at 2 LPM. No chest pain.  OBJECTIVE Filed Vitals:   10/10/12 1500 10/10/12 1600 10/10/12 1655 10/10/12 2012  BP:   128/70 127/51  Pulse: 88  69 78  Temp:   98.3 F (36.8 C) 98.4 F (36.9 C)  TempSrc:   Oral Oral  Resp: 18 24 22 20   Height:      Weight:      SpO2: 90%  97% 94%    Intake/Output Summary (Last 24 hours) at 10/11/12 0753 Last data filed at 10/11/12 0700  Gross per 24 hour  Intake    360 ml  Output   1400 ml  Net  -1040 ml   Filed Weights   10/08/12 1758 10/09/12 0227  Weight: 188 lb 11.4 oz (85.6 kg) 197 lb 1.5 oz (89.4 kg)    PHYSICAL EXAM General: Well developed, well nourished, elderly female in no acute distress. Head: Normocephalic, atraumatic.  Neck: Supple without bruits, JVD 8-10 cm. Lungs:  Resp regular and unlabored, rales bilaterally, no crackles. Heart: RRR, S1, S2, no S3, S4, 2/6 murmur. Abdomen: Soft, non-tender, non-distended, BS + x 4.  Extremities: No clubbing, cyanosis, No edema.  Neuro: Alert and oriented X 3. Moves all extremities spontaneously. Psych: Normal affect.  LABS: CBC: Basename 10/11/12 0615 10/10/12 0859 10/09/12 0700 10/08/12 1723  WBC 13.9* 18.1* -- --  NEUTROABS -- -- 12.2* 11.9*  HGB 10.1* 10.6* -- --  HCT 30.8* 32.1* -- --  MCV 91.7 91.7 -- --  PLT 206 218 -- --   INR: Basename 10/08/12 1723  INR 1.04   Basic Metabolic Panel: Basename 10/11/12 0615 10/10/12 0859  NA 142 143  K 4.3 3.0*  CL 102 100  CO2 30 29  GLUCOSE 124* 143*  BUN 23 18  CREATININE 1.07 1.11*  CALCIUM 8.9 8.9  MG 2.2 --  PHOS -- --   Liver  Function Tests: [facility] 10/09/12 0455 10/08/12 1723  AST 40* 30  ALT 22 21  ALKPHOS 71 85  BILITOT 0.3 0.2*  PROT 6.7 7.5  ALBUMIN 2.7* 3.2*   Cardiac Enzymes: Basename 10/09/12 1712 10/09/12 1003 10/09/12 0500  CKTOTAL -- -- --  CKMB -- -- --  CKMBINDEX -- -- --  TROPONINI 1.35* 1.83* 2.33*    Basename 10/08/12 2326 10/08/12 2011  TROPIPOC 1.63* 1.06*   BNP: Pro B Natriuretic peptide (BNP)  Date/Time Value Range Status  10/08/2012  5:23 PM 1356.0* 0 - 450 pg/mL Final  09/03/2012  6:44 AM 936.0* 0 - 450 pg/mL Final   Thyroid Function Tests: Basename 10/10/12 0859 10/09/12 0455  TSH -- 0.023*  T4TOTAL -- --  T3FREE 1.9* --  THYROIDAB -- --    TELE:        ECG:   Radiology/Studies: Ct Head Wo Contrast 10/08/2012  *RADIOLOGY REPORT*  Clinical Data: Fever, unresponsive.  CT HEAD WITHOUT CONTRAST  Technique:  Contiguous axial images were obtained from the base of the skull through the vertex without contrast.  Comparison: 05/14/2012  Findings: Atherosclerotic and physiologic intracranial calcifications.  Motion degraded many images, requiring rescanning. Diffuse parenchymal atrophy. Patchy areas of hypoattenuation in deep  and periventricular white matter bilaterally.  Prominence of lateral and third ventricles as before.  Negative for acute intracranial hemorrhage, mass lesion, acute infarction, midline shift, or mass-effect. Acute infarct may be inapparent on noncontrast CT. Ventricles and sulci symmetric. Bone windows demonstrate no focal lesion.  IMPRESSION:  1. Negative for bleed or other acute intracranial process.  2. Atrophy and nonspecific white matter changes.   Original Report Authenticated By: D. Andria Rhein, MD    Dg Chest Portable 1 View 10/08/2012  *RADIOLOGY REPORT*  Clinical Data: CPR, fever.  PORTABLE CHEST - 1 VIEW  Comparison: 09/02/2012  Findings: Stable cardiomegaly.  Atheromatous aorta.  Some increase in mild interstitial edema and pulmonary vascular  congestion. Persistent blunting of the left lateral costophrenic angle. Regional bones unremarkable.  IMPRESSION:  1.  Stable cardiomegaly with some increase in mild interstitial edema.   Original Report Authenticated By: D. Andria Rhein, MD     Current Medications:    . amLODipine  10 mg Oral Daily  . ampicillin (OMNIPEN) IV  2 g Intravenous Q4H  . antiseptic oral rinse  15 mL Mouth Rinse QID  . cefTRIAXone (ROCEPHIN)  IV  2 g Intravenous Q12H  . dexamethasone  6 mg Intravenous Q6H  . dipyridamole-aspirin  1 capsule Oral BID  . donepezil  10 mg Oral QHS  . heparin  5,000 Units Subcutaneous Q8H  . insulin aspart  0-15 Units Subcutaneous TID WC  . levothyroxine  150 mcg Oral QAC breakfast  . multivitamin with minerals  1 tablet Oral Daily  . pantoprazole  40 mg Oral Daily  . potassium chloride  40 mEq Oral BID  . psyllium  1 packet Oral Daily  . saccharomyces boulardii  250 mg Oral BID  . sertraline  50 mg Oral Daily  . simvastatin  20 mg Oral QHS  . sodium chloride  3 mL Intravenous Q12H  . vancomycin  1,250 mg Intravenous Q24H      . sodium chloride Stopped (10/10/12 1317)    ASSESSMENT AND PLAN: Diastolic CHF - weights not done, I/O negative, volume status appears OK by exam, has DOE but suspect chronic, no acute symptoms, no diuresis for now with acute illness, sepsis. EF 55-60% 8/13, MD advise if recheck needed or further eval needed.   Demand ischemia of myocardium - troponin has some elevation but no ischemic symptoms and enzymes are fairly flat. Will recheck to make sure coming down. MD advise on adding ASA 81 since on Aggrenox. Check ECHO.  Otherwise, per primary MD Principal Problem:  *Severe sepsis - possible meningitis Active Problems:  Altered mental status  Hypothyroidism  Temporal arteritis  Hypertension  Acute respiratory failure  ARF (acute renal failure)  Meningitis, bacterial    Signed, Theodore Demark , PA-C 7:53 AM 10/11/2012  Patient seen and  examined.  Agree with findings of R Barrett above Patient denies CP  Breathing OK ON exam lungs are rel clear. Neck shows JVP is increased  Cardiac exam RRR.  No murmurs  Ext  No edema  Volume status is not bad  May be up a little bit. Recheck BNP Plan for echo today.

## 2012-10-12 DIAGNOSIS — F0390 Unspecified dementia without behavioral disturbance: Secondary | ICD-10-CM

## 2012-10-12 DIAGNOSIS — E039 Hypothyroidism, unspecified: Secondary | ICD-10-CM

## 2012-10-12 DIAGNOSIS — R509 Fever, unspecified: Secondary | ICD-10-CM

## 2012-10-12 DIAGNOSIS — R5383 Other fatigue: Secondary | ICD-10-CM

## 2012-10-12 DIAGNOSIS — Z9981 Dependence on supplemental oxygen: Secondary | ICD-10-CM

## 2012-10-12 DIAGNOSIS — I059 Rheumatic mitral valve disease, unspecified: Secondary | ICD-10-CM

## 2012-10-12 LAB — BASIC METABOLIC PANEL
CO2: 27 mEq/L (ref 19–32)
Chloride: 102 mEq/L (ref 96–112)
Creatinine, Ser: 1.04 mg/dL (ref 0.50–1.10)

## 2012-10-12 LAB — CBC
HCT: 33.8 % — ABNORMAL LOW (ref 36.0–46.0)
Hemoglobin: 10.8 g/dL — ABNORMAL LOW (ref 12.0–15.0)
MCV: 91.1 fL (ref 78.0–100.0)
RBC: 3.71 MIL/uL — ABNORMAL LOW (ref 3.87–5.11)
RDW: 14.2 % (ref 11.5–15.5)
WBC: 13.4 10*3/uL — ABNORMAL HIGH (ref 4.0–10.5)

## 2012-10-12 LAB — GLUCOSE, CAPILLARY: Glucose-Capillary: 143 mg/dL — ABNORMAL HIGH (ref 70–99)

## 2012-10-12 LAB — CSF CULTURE W GRAM STAIN: Culture: NO GROWTH

## 2012-10-12 MED ORDER — SODIUM CHLORIDE 0.9 % IV SOLN: 2.0000 g | Freq: Four times a day (QID) | INTRAVENOUS | Status: DC

## 2012-10-12 MED ORDER — DEXTROSE 5 % IV SOLN: 2.0000 g | Freq: Two times a day (BID) | INTRAVENOUS | Status: DC

## 2012-10-12 MED ORDER — LEVOTHYROXINE SODIUM 150 MCG PO TABS: 150.0000 ug | ORAL_TABLET | Freq: Every day | ORAL | Status: DC

## 2012-10-12 MED ORDER — VANCOMYCIN HCL 10 G IV SOLR: 1250.0000 mg | INTRAVENOUS | Status: DC

## 2012-10-12 NOTE — Progress Notes (Signed)
ANTIBIOTIC CONSULT NOTE - FOLLOW UP  Pharmacy Consult for vancomycin Indication: meningitis  Labs:  [facility] 10/12/12 0530 10/11/12 0615 10/10/12 0859  WBC 13.4* 13.9* 18.1*  HGB 10.8* 10.1* 10.6*  PLT 217 206 218  LABCREA -- -- --  CREATININE 1.04 1.07 1.11*   Estimated Creatinine Clearance: 51.4 ml/min (by C-G formula based on Cr of 1.04).  Basename 10/12/12 0530  VANCOTROUGH 16.1  VANCOPEAK --  VANCORANDOM --  GENTTROUGH --  GENTPEAK --  GENTRANDOM --  TOBRATROUGH --  TOBRAPEAK --  TOBRARND --  AMIKACINPEAK --  AMIKACINTROU --  AMIKACIN --     Microbiology: Recent Results (from the past 720 hour(s))  CULTURE, BLOOD (ROUTINE X 2)     Status: Normal (Preliminary result)   Collection Time   10/08/12  5:45 PM      Component Value Range Status Comment   Specimen Description BLOOD LEFT ARM   Final    Special Requests BOTTLES DRAWN AEROBIC ONLY 4CC   Final    Culture  Setup Time 10/09/2012 02:11   Final    Culture     Final    Value:        BLOOD CULTURE RECEIVED NO GROWTH TO DATE CULTURE WILL BE HELD FOR 5 DAYS BEFORE ISSUING A FINAL NEGATIVE REPORT   Report Status PENDING   Incomplete   URINE CULTURE     Status: Normal   Collection Time   10/08/12  5:48 PM      Component Value Range Status Comment   Specimen Description URINE, CLEAN CATCH   Final    Special Requests NONE   Final    Culture  Setup Time 10/08/2012 18:42   Final    Colony Count NO GROWTH   Final    Culture NO GROWTH   Final    Report Status 10/09/2012 FINAL   Final   CULTURE, BLOOD (ROUTINE X 2)     Status: Normal (Preliminary result)   Collection Time   10/08/12  6:00 PM      Component Value Range Status Comment   Specimen Description BLOOD LEFT FOOT   Final    Special Requests BOTTLES DRAWN AEROBIC ONLY 1CC   Final    Culture  Setup Time 10/09/2012 02:11   Final    Culture     Final    Value:        BLOOD CULTURE RECEIVED NO GROWTH TO DATE CULTURE WILL BE HELD FOR 5 DAYS BEFORE ISSUING A FINAL  NEGATIVE REPORT   Report Status PENDING   Incomplete   CSF CULTURE     Status: Normal (Preliminary result)   Collection Time   10/08/12  9:58 PM      Component Value Range Status Comment   Specimen Description CSF   Final    Special Requests NONE   Final    Gram Stain     Final    Value: WBC PRESENT,BOTH PMN AND MONONUCLEAR     NO ORGANISMS SEEN     CYTOSPIN SLIDE Performed at [facility]   Culture NO GROWTH 2 DAYS   Final    Report Status PENDING   Incomplete   GRAM STAIN     Status: Normal   Collection Time   10/08/12  9:58 PM      Component Value Range Status Comment   Specimen Description CSF   Final    Special Requests NO 2 1CC   Final    Gram Stain  Final    Value: CYTOSPIN SLIDE     WBC PRESENT,BOTH PMN AND MONONUCLEAR     NO ORGANISMS SEEN   Report Status 10/08/2012 FINAL   Final   MRSA PCR SCREENING     Status: Normal   Collection Time   10/09/12  4:22 AM      Component Value Range Status Comment   MRSA by PCR NEGATIVE  NEGATIVE Final     Anti-infectives     Start     Dose/Rate Route Frequency Ordered Stop   10/09/12 1600   ampicillin (OMNIPEN) 2 g in sodium chloride 0.9 % 50 mL IVPB        2 g 150 mL/hr over 20 Minutes Intravenous 6 times per day 10/09/12 1356     10/09/12 1300   cefTRIAXone (ROCEPHIN) 2 g in dextrose 5 % 50 mL IVPB        2 g 100 mL/hr over 30 Minutes Intravenous Every 12 hours 10/09/12 1202     10/09/12 0600   ampicillin (OMNIPEN) 1 g in sodium chloride 0.9 % 50 mL IVPB  Status:  Discontinued        1 g 150 mL/hr over 20 Minutes Intravenous 4 times per day 10/09/12 0239 10/09/12 1356   10/09/12 0600   vancomycin (VANCOCIN) IVPB 1000 mg/200 mL premix  Status:  Discontinued        1,000 mg 200 mL/hr over 60 Minutes Intravenous Every 48 hours 10/09/12 0305 10/09/12 0811   10/09/12 0600   ceFEPIme (MAXIPIME) 2 g in dextrose 5 % 50 mL IVPB  Status:  Discontinued        2 g 100 mL/hr over 30 Minutes Intravenous 2 times daily 10/09/12  0305 10/09/12 1202   10/09/12 0600   vancomycin (VANCOCIN) 1,250 mg in sodium chloride 0.9 % 250 mL IVPB        1,250 mg 166.7 mL/hr over 90 Minutes Intravenous Every 24 hours 10/09/12 0811     10/08/12 2330   ampicillin (OMNIPEN) 2 g in sodium chloride 0.9 % 50 mL IVPB        2 g 150 mL/hr over 20 Minutes Intravenous  Once 10/08/12 2320 10/09/12 0041   10/08/12 1800   vancomycin (VANCOCIN) 1,250 mg in sodium chloride 0.9 % 250 mL IVPB     Comments: (15mg /kg)      1,250 mg 166.7 mL/hr over 90 Minutes Intravenous  Once 10/08/12 1736 10/08/12 2025   10/08/12 1745   cefTRIAXone (ROCEPHIN) 2 g in dextrose 5 % 50 mL IVPB        2 g 100 mL/hr over 30 Minutes Intravenous  Once 10/08/12 1736 10/08/12 2122   10/08/12 1745  piperacillin-tazobactam (ZOSYN) IVPB 3.375 g       3.375 g 100 mL/hr over 30 Minutes Intravenous  Once 10/08/12 1736 10/08/12 2236          Assessment/Plan: 79yo female therapeutic on vancomycin for meningitis; level was drawn after just two doses with last dose hung two hours late, both of which could affect true level.  Has been clinically improving.  Will continue at current dose and monitor closely.   Colleen Can PharmD BCPS 10/12/2012,6:42 AM

## 2012-10-12 NOTE — Discharge Summary (Signed)
Physician Discharge Summary  Patient ID: [name] MRN: [id number] DOB/AGE: 02-18-1930 79 y.o.  Admit date: 10/08/2012 Discharge date: 10/12/2012  Primary Care Physician:  Cain Saupe, MD   Discharge Diagnoses:    Principal Problem:  *Meningitis, bacterial Active Problems:  Altered mental status  Hypothyroidism  Diastolic CHF  Temporal arteritis  Hypertension  Acute respiratory failure  ARF (acute renal failure)  Severe sepsis  Demand ischemia of myocardium      Medication List     As of 10/12/2012  1:25 PM    STOP taking these medications         albuterol (2.5 MG/3ML) 0.083% nebulizer solution   Commonly known as: PROVENTIL      ipratropium 0.02 % nebulizer solution   Commonly known as: ATROVENT      sulfamethoxazole-trimethoprim 400-80 MG per tablet   Commonly known as: BACTRIM,SEPTRA      torsemide 20 MG tablet   Commonly known as: DEMADEX      TAKE these medications         acetaminophen 325 MG tablet   Commonly known as: TYLENOL   Take 650 mg by mouth every 6 (six) hours as needed. For pain      amLODipine 5 MG tablet   Commonly known as: NORVASC   Take 5 mg by mouth daily.      Cranberry 200 MG Caps   Take 200 mg by mouth 2 (two) times daily.      dextrose 5 % SOLN 50 mL with cefTRIAXone 2 G SOLR 2 g   Inject 2 g into the vein 2 times daily at 12 noon and 4 pm. Until 1/21.      dipyridamole-aspirin 200-25 MG per 12 hr capsule   Commonly known as: AGGRENOX   Take 1 capsule by mouth 2 (two) times daily.      donepezil 10 MG tablet   Commonly known as: ARICEPT   Take 10 mg by mouth at bedtime.      levothyroxine 150 MCG tablet   Commonly known as: SYNTHROID, LEVOTHROID   Take 1 tablet (150 mcg total) by mouth daily before breakfast.      loperamide 2 MG capsule   Commonly known as: IMODIUM   Take 1 capsule (2 mg total) by mouth as needed for diarrhea or loose stools.      multivitamin with minerals Tabs   Take 1 tablet by mouth  daily.      omeprazole 20 MG capsule   Commonly known as: PRILOSEC   Take 20 mg by mouth daily.      oyster calcium 500 MG Tabs   Take 500 mg of elemental calcium by mouth 2 (two) times daily.      polycarbophil 625 MG tablet   Commonly known as: FIBERCON   Take 625 mg by mouth daily.      potassium chloride SA 20 MEQ tablet   Commonly known as: K-DUR,KLOR-CON   Take 20 mEq by mouth 2 (two) times daily.      predniSONE 5 MG tablet   Commonly known as: DELTASONE   Take 10-15 mg by mouth daily. Alternate 10mg  and 15 mg doses      saccharomyces boulardii 250 MG capsule   Commonly known as: FLORASTOR   Take 250 mg by mouth 2 (two) times daily.      sertraline 50 MG tablet   Commonly known as: ZOLOFT   Take 50 mg by mouth daily.  simvastatin 20 MG tablet   Commonly known as: ZOCOR   Take 20 mg by mouth at bedtime.      sodium chloride 0.9 % SOLN 250 mL with vancomycin 10 G SOLR 1,250 mg   Inject 1,250 mg into the vein daily. Until 1/21.      sodium chloride 0.9 % SOLN 50 mL with ampicillin 2 G SOLR 2 g   Inject 2 g into the vein every 6 (six) hours. Until 1/21.         Disposition and Follow-up:  Will be discharged to SNF today in stable and improved condition. Will complete antibiotics for bacterial meningitis on 10/16/12.  Consults:  ID, Dr. Ninetta Lights.   Significant Diagnostic Studies:  No results found.  Brief H and P: For complete details please refer to admission H and P, but in brief patient is an 79 y/o woman resident of an assisted living with history of chronic diastolic CHF on supplemental 02, temporal arteritis on chronic steroids, hypothyroidism, mild dementia, with recent hospitalization for her acute on chronic respiratory failure with Pneumonia and was brought in by family after she was found to have acute change in mental status beginning the morning prior to admission. Was noted to be very confused and not following any commands. EMS arrived and noted  to be febrile at 102F and also noted some seizure-like activity (with a short duration of generalized tonic-clonic seizure as per the ED) her GCS was 3 on arrival to the ED and required BiPAP for acute respiratory distress. She was tachycardic and hypoxic with sats in 60s and cyanotic on arrival. Given symptoms of severe sepsis workup was initiated including head CT , EKG and labs. Blood work showed significant leukocytosis of 23,000 with an elevated lactate of 5.3 and an elevated troponin greater than 1. Given concern for meningitis and LP was done in the ED which showed significant WBC elevated protein and glucose. She was given a dose of IV vancomycin Zosyn and ceftriaxone along with 500 cc normal saline. Given CSF findings, a dose of IV ampicillin was also given. Her mental status did improve with GCS of 14 and weaned off BiPAP and placed on nonrebreather. A repeat lactic acid improved to 2. Her EKG showed right bundle branch block with diffuse T-wave inversion. Patient is oriented to place and person on evaluation however is quite lethargic. Is not able to provide much history but does complain of headache and neck pain.  We were asked to admit her for further evaluation and management.    Hospital Course:  Principal Problem:  *Meningitis, bacterial Active Problems:  Altered mental status  Hypothyroidism  Diastolic CHF  Temporal arteritis  Hypertension  Acute respiratory failure  ARF (acute renal failure)  Severe sepsis  Demand ischemia of myocardium   Bacterial meningitis  -Per ID recommendations, will complete 10 days of broad-spectrum antibiotics to include vanc/rocephin/ampicillin. Course to be completed on 10/16/12. - cx data is negative to date.   Encephalopathy   - pt now back to baseline mental status - suspect this is due to meningitis in setting of baseline dementia  -EEG negative for seizure-like activity.  Demand ischemia  -Troponin is trending down  - Has been seen by  cardiology and no further cardiac work up is being entertained at this point. -Likely 2/2 severe sepsis in setting of probable bacterial meningitis.  Acute resp failure  -Resolved  - due to meningitis related sepsis in setting of diastolic chf   Acute renal  failure  -Renal function improving nicely and back to her baseline Cr of around 1.10.  Chronic diastolic CHF  -Holding on further diuresis at this time - no indication clinically of volume overload.  HTN  -Well controlled on increased dose of norvasc.   Hypokalemia  -Repleted  -Mg ok at 2.2   Temporal arteritis on chronic steroid tx  -Continue home dose of prednisone.  Hypothyroidism  -TSH is significantly suppressed.  -Dose has been decreased to 150 mcg daily.   Mild dementia  -Cont usual outpt meds    Time spent on Discharge: Greater than 30 minutes.  SignedChaya Jan [facility] Pager: [phone] 10/12/2012, 1:25 PM

## 2012-10-12 NOTE — Clinical Social Work Note (Signed)
CSW following for dc planning. Pt is from ALF [facility] and is being discharged on IV antibiotics. MD notified CSW that Pt will need to dc with iv antibiotics that will not be able to be maintained at ALF level of care. CSW discussed concern with Pt and Pt's daughter. Pt has been to SNF in the past and was pleased with the care. Both Pt and Pt's daughter were agreeable to dc to snf for 5 days to continue IV antibiotics before returning to ALF. Pt's daughter requested [facility], CSW contacted facility and sent Pt's information. SNF has bed for Pt. MD updated and Pt's daughter Angelique Blonder will transport Pt to SNF today.    CSW notified [facility] of level of care change.   Frederico Hamman, LCSW [phone]

## 2012-10-12 NOTE — Progress Notes (Signed)
INFECTIOUS DISEASE PROGRESS NOTE  ID: [name] is a 79 y.o. female with  Principal Problem:  *Severe sepsis Active Problems:  Altered mental status  Hypothyroidism  Diastolic CHF  Temporal arteritis  Hypertension  Acute respiratory failure  ARF (acute renal failure)  Meningitis, bacterial  Demand ischemia of myocardium  Subjective: C/o headache with laying flat.  C/o pain in her bottom from being bathed (per staff she has a lot of irritation)  Abtx:  Anti-infectives     Start     Dose/Rate Route Frequency Ordered Stop   10/09/12 1600   ampicillin (OMNIPEN) 2 g in sodium chloride 0.9 % 50 mL IVPB        2 g 150 mL/hr over 20 Minutes Intravenous 6 times per day 10/09/12 1356     10/09/12 1300   cefTRIAXone (ROCEPHIN) 2 g in dextrose 5 % 50 mL IVPB        2 g 100 mL/hr over 30 Minutes Intravenous Every 12 hours 10/09/12 1202     10/09/12 0600   ampicillin (OMNIPEN) 1 g in sodium chloride 0.9 % 50 mL IVPB  Status:  Discontinued        1 g 150 mL/hr over 20 Minutes Intravenous 4 times per day 10/09/12 0239 10/09/12 1356   10/09/12 0600   vancomycin (VANCOCIN) IVPB 1000 mg/200 mL premix  Status:  Discontinued        1,000 mg 200 mL/hr over 60 Minutes Intravenous Every 48 hours 10/09/12 0305 10/09/12 0811   10/09/12 0600   ceFEPIme (MAXIPIME) 2 g in dextrose 5 % 50 mL IVPB  Status:  Discontinued        2 g 100 mL/hr over 30 Minutes Intravenous 2 times daily 10/09/12 0305 10/09/12 1202   10/09/12 0600   vancomycin (VANCOCIN) 1,250 mg in sodium chloride 0.9 % 250 mL IVPB        1,250 mg 166.7 mL/hr over 90 Minutes Intravenous Every 24 hours 10/09/12 0811     10/08/12 2330   ampicillin (OMNIPEN) 2 g in sodium chloride 0.9 % 50 mL IVPB        2 g 150 mL/hr over 20 Minutes Intravenous  Once 10/08/12 2320 10/09/12 0041   10/08/12 1800   vancomycin (VANCOCIN) 1,250 mg in sodium chloride 0.9 % 250 mL IVPB     Comments: (15mg /kg)      1,250 mg 166.7 mL/hr over 90  Minutes Intravenous  Once 10/08/12 1736 10/08/12 2025   10/08/12 1745   cefTRIAXone (ROCEPHIN) 2 g in dextrose 5 % 50 mL IVPB        2 g 100 mL/hr over 30 Minutes Intravenous  Once 10/08/12 1736 10/08/12 2122   10/08/12 1745  piperacillin-tazobactam (ZOSYN) IVPB 3.375 g       3.375 g 100 mL/hr over 30 Minutes Intravenous  Once 10/08/12 1736 10/08/12 2236          Medications:  Scheduled:   . amLODipine  10 mg Oral Daily  . ampicillin (OMNIPEN) IV  2 g Intravenous Q4H  . antiseptic oral rinse  15 mL Mouth Rinse QID  . cefTRIAXone (ROCEPHIN)  IV  2 g Intravenous Q12H  . dexamethasone  6 mg Intravenous Q6H  . dipyridamole-aspirin  1 capsule Oral BID  . donepezil  10 mg Oral QHS  . heparin  5,000 Units Subcutaneous Q8H  . insulin aspart  0-15 Units Subcutaneous TID WC  . levothyroxine  150 mcg Oral QAC breakfast  .  multivitamin with minerals  1 tablet Oral Daily  . pantoprazole  40 mg Oral Daily  . psyllium  1 packet Oral Daily  . saccharomyces boulardii  250 mg Oral BID  . sertraline  50 mg Oral Daily  . simvastatin  20 mg Oral QHS  . sodium chloride  3 mL Intravenous Q12H  . vancomycin  1,250 mg Intravenous Q24H    Objective: Vital signs in last 24 hours: Temp:  [97.3 F (36.3 C)-98.3 F (36.8 C)] 97.3 F (36.3 C) (01/17 0451) Pulse Rate:  [65-87] 87  (01/17 0451) Resp:  [18] 18  (01/17 0451) BP: (115-153)/(59-79) 153/79 mmHg (01/17 0451) SpO2:  [93 %-98 %] 95 % (01/17 0451) Weight:  [89.2 kg (196 lb 10.4 oz)] 89.2 kg (196 lb 10.4 oz) (01/17 0451)   General appearance: alert, cooperative and no distress Neck: FROM Resp: clear to auscultation bilaterally Cardio: regular rate and rhythm GI: normal findings: bowel sounds normal and soft, non-tender  Lab Results  Basename 10/12/12 0530 10/11/12 0615  WBC 13.4* 13.9*  HGB 10.8* 10.1*  HCT 33.8* 30.8*  NA 140 142  K 4.5 4.3  CL 102 102  CO2 27 30  BUN 29* 23  CREATININE 1.04 1.07  GLU -- --   Liver  Panel No results found for this basename: PROT:2,ALBUMIN:2,AST:2,ALT:2,ALKPHOS:2,BILITOT:2,BILIDIR:2,IBILI:2 in the last 72 hours Sedimentation Rate No results found for this basename: ESRSEDRATE in the last 72 hours C-Reactive Protein No results found for this basename: CRP:2 in the last 72 hours  Microbiology: Recent Results (from the past 240 hour(s))  CULTURE, BLOOD (ROUTINE X 2)     Status: Normal (Preliminary result)   Collection Time   10/08/12  5:45 PM      Component Value Range Status Comment   Specimen Description BLOOD LEFT ARM   Final    Special Requests BOTTLES DRAWN AEROBIC ONLY 4CC   Final    Culture  Setup Time 10/09/2012 02:11   Final    Culture     Final    Value:        BLOOD CULTURE RECEIVED NO GROWTH TO DATE CULTURE WILL BE HELD FOR 5 DAYS BEFORE ISSUING A FINAL NEGATIVE REPORT   Report Status PENDING   Incomplete   URINE CULTURE     Status: Normal   Collection Time   10/08/12  5:48 PM      Component Value Range Status Comment   Specimen Description URINE, CLEAN CATCH   Final    Special Requests NONE   Final    Culture  Setup Time 10/08/2012 18:42   Final    Colony Count NO GROWTH   Final    Culture NO GROWTH   Final    Report Status 10/09/2012 FINAL   Final   CULTURE, BLOOD (ROUTINE X 2)     Status: Normal (Preliminary result)   Collection Time   10/08/12  6:00 PM      Component Value Range Status Comment   Specimen Description BLOOD LEFT FOOT   Final    Special Requests BOTTLES DRAWN AEROBIC ONLY 1CC   Final    Culture  Setup Time 10/09/2012 02:11   Final    Culture     Final    Value:        BLOOD CULTURE RECEIVED NO GROWTH TO DATE CULTURE WILL BE HELD FOR 5 DAYS BEFORE ISSUING A FINAL NEGATIVE REPORT   Report Status PENDING   Incomplete   CSF CULTURE  Status: Normal (Preliminary result)   Collection Time   10/08/12  9:58 PM      Component Value Range Status Comment   Specimen Description CSF   Final    Special Requests NONE   Final    Gram Stain      Final    Value: WBC PRESENT,BOTH PMN AND MONONUCLEAR     NO ORGANISMS SEEN     CYTOSPIN SLIDE Performed at [facility]   Culture NO GROWTH 2 DAYS   Final    Report Status PENDING   Incomplete   GRAM STAIN     Status: Normal   Collection Time   10/08/12  9:58 PM      Component Value Range Status Comment   Specimen Description CSF   Final    Special Requests NO 2 1CC   Final    Gram Stain     Final    Value: CYTOSPIN SLIDE     WBC PRESENT,BOTH PMN AND MONONUCLEAR     NO ORGANISMS SEEN   Report Status 10/08/2012 FINAL   Final   MRSA PCR SCREENING     Status: Normal   Collection Time   10/09/12  4:22 AM      Component Value Range Status Comment   MRSA by PCR NEGATIVE  NEGATIVE Final     Studies/Results: No results found.   Assessment/Plan: Meningitis Day 4 anbx (amp/ceftriaxone/vanco/decadron)  Could start to taper off her decadron (4 days total) Plan for 10 days of rx Topicals and pressure relief for her bottom.  Available if questions   [name] Infectious Diseases [phone] 10/12/2012, 10:22 AM   LOS: 4 days

## 2012-10-12 NOTE — Progress Notes (Signed)
  Echocardiogram 2D Echocardiogram has been performed.  Georgian Co 10/12/2012, 10:10 AM

## 2012-10-13 NOTE — Procedures (Signed)
EEG report.  Brief clinical history: 79 years old female of assisted living brought in for acute mental status changes. She had apparent seizure like activity noted by EMS. Has a history of dementia. No prior history of frank epileptic seizures.  Technique: this is a 17 channel routine scalp EEG performed at the bedside with bipolar and monopolar montages arranged in accordance to the international 10/20 system of electrode placement. One channel was dedicated to EKG recording.  Intermittent photic stimulation was the sole activating procedure employed during the test.  Description: Patient spent a very limited time in the wakeful state and the best background consisted of a medium amplitude 7 Hz rhythm that is poorly sustained by symmetric and reactive. The overall study is dominated by high amplitude, generalized, continuous theta-delta activity that doesn't follow an ictal pattern and at times seems to be more prominent over the anterior head regions bilaterally. There is no evidence of focal or generalized epileptiform discharges.   EKG showed sinus rhythm.  Impression: this is an abnormal EEG with findings consistent with a moderate encephalopathy, non specific as to cause. Please, be aware that the absence of epileptiform discharges does not exclude the possibility of epilepsy.  Clinical correlation is advised.  Wyatt Portela, MD

## 2012-10-15 LAB — CULTURE, BLOOD (ROUTINE X 2): Culture: NO GROWTH

## 2012-12-04 ENCOUNTER — Encounter (HOSPITAL_COMMUNITY): Payer: Self-pay | Admitting: *Deleted

## 2012-12-04 ENCOUNTER — Inpatient Hospital Stay (HOSPITAL_COMMUNITY)
Admission: EM | Admit: 2012-12-04 | Discharge: 2012-12-09 | DRG: 872 | Disposition: A | Discharge status: Skilled Nursing Facility | Payer: Medicare Other | Attending: Internal Medicine | Admitting: Internal Medicine

## 2012-12-04 ENCOUNTER — Inpatient Hospital Stay (HOSPITAL_COMMUNITY): Payer: Medicare Other

## 2012-12-04 DIAGNOSIS — R509 Fever, unspecified: Secondary | ICD-10-CM

## 2012-12-04 DIAGNOSIS — E039 Hypothyroidism, unspecified: Secondary | ICD-10-CM | POA: Diagnosis present

## 2012-12-04 DIAGNOSIS — R6520 Severe sepsis without septic shock: Secondary | ICD-10-CM

## 2012-12-04 DIAGNOSIS — R001 Bradycardia, unspecified: Secondary | ICD-10-CM

## 2012-12-04 DIAGNOSIS — I43 Cardiomyopathy in diseases classified elsewhere: Secondary | ICD-10-CM | POA: Diagnosis present

## 2012-12-04 DIAGNOSIS — R197 Diarrhea, unspecified: Secondary | ICD-10-CM

## 2012-12-04 DIAGNOSIS — Z66 Do not resuscitate: Secondary | ICD-10-CM | POA: Diagnosis present

## 2012-12-04 DIAGNOSIS — I498 Other specified cardiac arrhythmias: Secondary | ICD-10-CM | POA: Diagnosis present

## 2012-12-04 DIAGNOSIS — R112 Nausea with vomiting, unspecified: Secondary | ICD-10-CM

## 2012-12-04 DIAGNOSIS — N179 Acute kidney failure, unspecified: Secondary | ICD-10-CM | POA: Diagnosis present

## 2012-12-04 DIAGNOSIS — R4182 Altered mental status, unspecified: Secondary | ICD-10-CM

## 2012-12-04 DIAGNOSIS — F0390 Unspecified dementia without behavioral disturbance: Secondary | ICD-10-CM | POA: Diagnosis present

## 2012-12-04 DIAGNOSIS — Z79899 Other long term (current) drug therapy: Secondary | ICD-10-CM

## 2012-12-04 DIAGNOSIS — A0472 Enterocolitis due to Clostridium difficile, not specified as recurrent: Secondary | ICD-10-CM | POA: Diagnosis present

## 2012-12-04 DIAGNOSIS — E871 Hypo-osmolality and hyponatremia: Secondary | ICD-10-CM | POA: Diagnosis present

## 2012-12-04 DIAGNOSIS — Z8673 Personal history of transient ischemic attack (TIA), and cerebral infarction without residual deficits: Secondary | ICD-10-CM

## 2012-12-04 DIAGNOSIS — A414 Sepsis due to anaerobes: Principal | ICD-10-CM | POA: Diagnosis present

## 2012-12-04 DIAGNOSIS — N390 Urinary tract infection, site not specified: Secondary | ICD-10-CM | POA: Diagnosis present

## 2012-12-04 DIAGNOSIS — A419 Sepsis, unspecified organism: Secondary | ICD-10-CM | POA: Diagnosis present

## 2012-12-04 DIAGNOSIS — IMO0002 Reserved for concepts with insufficient information to code with codable children: Secondary | ICD-10-CM

## 2012-12-04 DIAGNOSIS — Z9981 Dependence on supplemental oxygen: Secondary | ICD-10-CM

## 2012-12-04 DIAGNOSIS — M316 Other giant cell arteritis: Secondary | ICD-10-CM

## 2012-12-04 DIAGNOSIS — I509 Heart failure, unspecified: Secondary | ICD-10-CM | POA: Diagnosis present

## 2012-12-04 DIAGNOSIS — B9620 Unspecified Escherichia coli [E. coli] as the cause of diseases classified elsewhere: Secondary | ICD-10-CM | POA: Diagnosis present

## 2012-12-04 DIAGNOSIS — E876 Hypokalemia: Secondary | ICD-10-CM | POA: Diagnosis present

## 2012-12-04 DIAGNOSIS — I110 Hypertensive heart disease with heart failure: Secondary | ICD-10-CM | POA: Diagnosis present

## 2012-12-04 DIAGNOSIS — I5032 Chronic diastolic (congestive) heart failure: Secondary | ICD-10-CM | POA: Diagnosis present

## 2012-12-04 DIAGNOSIS — E860 Dehydration: Secondary | ICD-10-CM

## 2012-12-04 DIAGNOSIS — E785 Hyperlipidemia, unspecified: Secondary | ICD-10-CM | POA: Diagnosis present

## 2012-12-04 DIAGNOSIS — F32A Depression, unspecified: Secondary | ICD-10-CM | POA: Diagnosis present

## 2012-12-04 DIAGNOSIS — R531 Weakness: Secondary | ICD-10-CM | POA: Diagnosis present

## 2012-12-04 DIAGNOSIS — Z8661 Personal history of infections of the central nervous system: Secondary | ICD-10-CM

## 2012-12-04 LAB — URINALYSIS, ROUTINE W REFLEX MICROSCOPIC
Glucose, UA: NEGATIVE mg/dL
Nitrite: NEGATIVE
Protein, ur: 30 mg/dL — AB
Urobilinogen, UA: 0.2 mg/dL (ref 0.0–1.0)

## 2012-12-04 LAB — CBC
HCT: 42.7 % (ref 36.0–46.0)
MCHC: 32.6 g/dL (ref 30.0–36.0)
MCV: 90.5 fL (ref 78.0–100.0)
RDW: 14.6 % (ref 11.5–15.5)

## 2012-12-04 LAB — URINE MICROSCOPIC-ADD ON

## 2012-12-04 LAB — GRAM STAIN

## 2012-12-04 LAB — COMPREHENSIVE METABOLIC PANEL
Albumin: 3.4 g/dL — ABNORMAL LOW (ref 3.5–5.2)
BUN: 25 mg/dL — ABNORMAL HIGH (ref 6–23)
Chloride: 100 mEq/L (ref 96–112)
Creatinine, Ser: 1.36 mg/dL — ABNORMAL HIGH (ref 0.50–1.10)
Total Bilirubin: 0.3 mg/dL (ref 0.3–1.2)
Total Protein: 7.7 g/dL (ref 6.0–8.3)

## 2012-12-04 LAB — CSF CELL COUNT WITH DIFFERENTIAL
RBC Count, CSF: 5 /mm3 — ABNORMAL HIGH
Tube #: 4

## 2012-12-04 LAB — PHOSPHORUS: Phosphorus: 4.8 mg/dL — ABNORMAL HIGH (ref 2.3–4.6)

## 2012-12-04 LAB — URINALYSIS, DIPSTICK ONLY
Specific Gravity, Urine: 1.024 (ref 1.005–1.030)
Urobilinogen, UA: 0.2 mg/dL (ref 0.0–1.0)

## 2012-12-04 LAB — MAGNESIUM: Magnesium: 1.8 mg/dL (ref 1.5–2.5)

## 2012-12-04 LAB — GLUCOSE, CSF: Glucose, CSF: 83 mg/dL — ABNORMAL HIGH (ref 43–76)

## 2012-12-04 LAB — PROCALCITONIN: Procalcitonin: 4.48 ng/mL

## 2012-12-04 LAB — LIPASE, BLOOD: Lipase: 35 U/L (ref 11–59)

## 2012-12-04 MED ORDER — LEVOTHYROXINE SODIUM 150 MCG PO TABS
150.0000 ug | ORAL_TABLET | Freq: Every day | ORAL | Status: DC
  Administered 2012-12-05 – 2012-12-08 (×3): 150 ug via ORAL
  Filled 2012-12-04 (×6): qty 1

## 2012-12-04 MED ORDER — HYDROCORTISONE SOD SUCCINATE 100 MG IJ SOLR
100.0000 mg | Freq: Once | INTRAMUSCULAR | Status: AC
  Administered 2012-12-04: 100 mg via INTRAVENOUS
  Filled 2012-12-04: qty 2

## 2012-12-04 MED ORDER — SODIUM CHLORIDE 0.9 % IV BOLUS (SEPSIS)
1000.0000 mL | Freq: Once | INTRAVENOUS | Status: DC
  Administered 2012-12-04: 1000 mL via INTRAVENOUS

## 2012-12-04 MED ORDER — DEXTROSE 5 % IV SOLN
900.0000 mg | Freq: Two times a day (BID) | INTRAVENOUS | Status: DC
  Administered 2012-12-04 – 2012-12-05 (×2): 900 mg via INTRAVENOUS
  Filled 2012-12-04 (×3): qty 18

## 2012-12-04 MED ORDER — POTASSIUM CHLORIDE CRYS ER 20 MEQ PO TBCR: 20.0000 meq | EXTENDED_RELEASE_TABLET | Freq: Two times a day (BID) | ORAL | Status: DC

## 2012-12-04 MED ORDER — SODIUM CHLORIDE 0.9 % IV SOLN
2.0000 g | Freq: Four times a day (QID) | INTRAVENOUS | Status: DC
  Administered 2012-12-04 – 2012-12-05 (×3): 2 g via INTRAVENOUS
  Filled 2012-12-04 (×6): qty 2000

## 2012-12-04 MED ORDER — KCL IN DEXTROSE-NACL 20-5-0.45 MEQ/L-%-% IV SOLN
INTRAVENOUS | Status: DC
  Administered 2012-12-04: 22:00:00 via INTRAVENOUS
  Filled 2012-12-04 (×3): qty 1000

## 2012-12-04 MED ORDER — SODIUM CHLORIDE 0.9 % IV SOLN
2.0000 g | INTRAVENOUS | Status: DC
  Filled 2012-12-04 (×3): qty 2000

## 2012-12-04 MED ORDER — DEXTROSE 5 % IV SOLN
1.0000 g | Freq: Once | INTRAVENOUS | Status: AC
  Administered 2012-12-04: 1 g via INTRAVENOUS
  Filled 2012-12-04: qty 10

## 2012-12-04 MED ORDER — VANCOMYCIN HCL 10 G IV SOLR
1250.0000 mg | INTRAVENOUS | Status: DC
  Administered 2012-12-04: 1250 mg via INTRAVENOUS
  Filled 2012-12-04 (×2): qty 1250

## 2012-12-04 MED ORDER — ONDANSETRON HCL 4 MG PO TABS: 4.0000 mg | ORAL_TABLET | Freq: Four times a day (QID) | ORAL | Status: DC | PRN

## 2012-12-04 MED ORDER — SODIUM CHLORIDE 0.9 % IV BOLUS (SEPSIS)
250.0000 mL | Freq: Once | INTRAVENOUS | Status: AC
  Administered 2012-12-04: 250 mL via INTRAVENOUS

## 2012-12-04 MED ORDER — PANTOPRAZOLE SODIUM 40 MG PO TBEC: 40.0000 mg | DELAYED_RELEASE_TABLET | Freq: Every day | ORAL | Status: DC

## 2012-12-04 MED ORDER — VANCOMYCIN 50 MG/ML ORAL SOLUTION
125.0000 mg | Freq: Four times a day (QID) | ORAL | Status: DC
  Administered 2012-12-05 – 2012-12-09 (×18): 125 mg via ORAL
  Filled 2012-12-04 (×23): qty 2.5

## 2012-12-04 MED ORDER — SODIUM CHLORIDE 0.9 % IJ SOLN
10.0000 mL | INTRAMUSCULAR | Status: DC | PRN
  Administered 2012-12-07 – 2012-12-09 (×3): 10 mL

## 2012-12-04 MED ORDER — HYDROCORTISONE SOD SUCCINATE 100 MG IJ SOLR
50.0000 mg | Freq: Four times a day (QID) | INTRAMUSCULAR | Status: DC
  Administered 2012-12-05 – 2012-12-09 (×19): 50 mg via INTRAVENOUS
  Filled 2012-12-04 (×10): qty 1
  Filled 2012-12-04: qty 2
  Filled 2012-12-04 (×12): qty 1

## 2012-12-04 MED ORDER — ASPIRIN-DIPYRIDAMOLE ER 25-200 MG PO CP12
1.0000 | ORAL_CAPSULE | Freq: Two times a day (BID) | ORAL | Status: DC
  Administered 2012-12-04 – 2012-12-09 (×10): 1 via ORAL
  Filled 2012-12-04 (×12): qty 1

## 2012-12-04 MED ORDER — DONEPEZIL HCL 10 MG PO TABS
10.0000 mg | ORAL_TABLET | Freq: Every day | ORAL | Status: DC
  Administered 2012-12-04 – 2012-12-08 (×5): 10 mg via ORAL
  Filled 2012-12-04 (×6): qty 1

## 2012-12-04 MED ORDER — VANCOMYCIN 50 MG/ML ORAL SOLUTION
125.0000 mg | Freq: Four times a day (QID) | ORAL | Status: DC
  Administered 2012-12-04: 125 mg via ORAL
  Filled 2012-12-04 (×3): qty 2.5

## 2012-12-04 MED ORDER — PREDNISONE 10 MG PO TABS
15.0000 mg | ORAL_TABLET | ORAL | Status: DC
  Filled 2012-12-04: qty 1.5

## 2012-12-04 MED ORDER — SIMVASTATIN 20 MG PO TABS
20.0000 mg | ORAL_TABLET | Freq: Every day | ORAL | Status: DC
  Administered 2012-12-04 – 2012-12-08 (×5): 20 mg via ORAL
  Filled 2012-12-04 (×7): qty 1

## 2012-12-04 MED ORDER — SACCHAROMYCES BOULARDII 250 MG PO CAPS
250.0000 mg | ORAL_CAPSULE | Freq: Two times a day (BID) | ORAL | Status: DC
  Administered 2012-12-04 – 2012-12-09 (×10): 250 mg via ORAL
  Filled 2012-12-04 (×11): qty 1

## 2012-12-04 MED ORDER — PROMETHAZINE HCL 25 MG/ML IJ SOLN
12.5000 mg | Freq: Once | INTRAMUSCULAR | Status: AC
  Administered 2012-12-04: 12.5 mg via INTRAVENOUS
  Filled 2012-12-04: qty 1

## 2012-12-04 MED ORDER — DEXTROSE 5 % IV SOLN
2.0000 g | Freq: Two times a day (BID) | INTRAVENOUS | Status: DC
  Administered 2012-12-04 – 2012-12-05 (×2): 2 g via INTRAVENOUS
  Filled 2012-12-04 (×3): qty 2

## 2012-12-04 MED ORDER — IBUPROFEN 800 MG PO TABS
800.0000 mg | ORAL_TABLET | Freq: Once | ORAL | Status: AC
  Administered 2012-12-04: 800 mg via ORAL
  Filled 2012-12-04: qty 1

## 2012-12-04 MED ORDER — SODIUM CHLORIDE 0.9 % IV BOLUS (SEPSIS)
1000.0000 mL | Freq: Once | INTRAVENOUS | Status: AC
  Administered 2012-12-04: 1000 mL via INTRAVENOUS

## 2012-12-04 MED ORDER — ONDANSETRON HCL 4 MG/2ML IJ SOLN: 4.0000 mg | Freq: Four times a day (QID) | INTRAMUSCULAR | Status: DC | PRN

## 2012-12-04 MED ORDER — PREDNISONE 10 MG PO TABS: 10.0000 mg | ORAL_TABLET | ORAL | Status: DC

## 2012-12-04 MED ORDER — PREDNISONE 10 MG PO TABS
10.0000 mg | ORAL_TABLET | Freq: Once | ORAL | Status: DC
  Filled 2012-12-04: qty 1

## 2012-12-04 MED ORDER — SERTRALINE HCL 50 MG PO TABS
50.0000 mg | ORAL_TABLET | Freq: Every day | ORAL | Status: DC
  Administered 2012-12-04 – 2012-12-09 (×6): 50 mg via ORAL
  Filled 2012-12-04 (×6): qty 1

## 2012-12-04 MED ORDER — HYDROCORTISONE SOD SUCCINATE 100 MG PF FOR IT USE: 100.0000 mg | Freq: Once | INTRAMUSCULAR | Status: DC

## 2012-12-04 MED ORDER — METRONIDAZOLE IN NACL 5-0.79 MG/ML-% IV SOLN
500.0000 mg | Freq: Three times a day (TID) | INTRAVENOUS | Status: DC
  Administered 2012-12-04 – 2012-12-05 (×2): 500 mg via INTRAVENOUS
  Filled 2012-12-04 (×3): qty 100

## 2012-12-04 NOTE — H&P (Signed)
[facility] History and Physical  [name] VWU:[id number] DOB: Jul 10, 1930 DOA: 12/04/2012  Referring physician: Dr. Blinda Leatherwood PCP: Cain Saupe, MD  Specialists: Consulted ID [name]  Chief Complaint: weakness and altered mental status  HPI: [name] is a 79 y.o. female  With history of recent Bacterial meningitis 6 wks ago treated with amp/ceftriaxone/vanco.  Presents to the ED after a recent suspected viral GI outbreak at ALF where multiple residents were brought into the ED with diarrhea and nausea.  Patient has had nausea with emesis while in house as well as profuse diarrhea.  She is unable to provide history and much of the history is obtained from the medical records and daughter present.  While in the ED patient was found to have a wbc of 8.1, temp of 100.6, and UTI based on U/A.  Received rocephin prior to blood cultures or LP. Discussed with ED physician neurological status as well as history and as such we both agreed that CSF evaluation was warranted.  Review of Systems: Unable to obtain due to altered mental status  Past Medical History  Diagnosis Date  . Peripheral neuropathy   . Hypertension   . TIA (transient ischemic attack)   . Elevated sed rate   . Temporal arteritis     chronic steroids  . Hypothyroidism   . Colon cancer     colon  . History of hernia repair   . S/P rotator cuff surgery     right  . Diastolic CHF   . Cardiomegaly - hypertensive   . Chronic anemia   . Chronic steroid use   . Dementia   . Shortness of breath   . Hyperlipidemia   . CHF (congestive heart failure)   . Cardiomyopathy due to hypertension   . Dependence on supplemental oxygen   . Chronic UTI   . Dementia    Past Surgical History  Procedure Laterality Date  . Tonsillectomy    . Appendectomy    . Abdominal hysterectomy    . Colectomy     Social History:  reports that she has never smoked. She has never used smokeless tobacco. She reports that  she does not drink alcohol or use illicit drugs. Patient lives at ALF  Can patient participate in ADLs? yes  Allergies  Allergen Reactions  . Apap (Acetaminophen)     Unknown  . Ciprofloxacin Nausea And Vomiting  . Codeine Other (See Comments)    halucinations  . Demerol Other (See Comments)    halucinations  . Morphine And Related Other (See Comments)    halucinations  . Nitrofurantoin Monohyd Macro Other (See Comments)    Per MAR  . Pregabalin Other (See Comments)    unknown  . Septra (Bactrim) Other (See Comments)    Unknown cant tolerate DS    History reviewed. No pertinent family history. None other reported by daughter  Prior to Admission medications   Medication Sig Start Date End Date Taking? Authorizing Provider  acetaminophen (TYLENOL) 325 MG tablet Take 650 mg by mouth every 6 (six) hours as needed. For pain   Yes Historical Provider, MD  amLODipine (NORVASC) 5 MG tablet Take 5 mg by mouth daily.   Yes Historical Provider, MD  Cranberry 200 MG CAPS Take 200 mg by mouth 2 (two) times daily.   Yes Historical Provider, MD  dipyridamole-aspirin (AGGRENOX) 25-200 MG per 12 hr capsule Take 1 capsule by mouth 2 (two) times daily.     Yes Historical Provider,  MD  donepezil (ARICEPT) 10 MG tablet Take 10 mg by mouth at bedtime.    Yes Historical Provider, MD  levothyroxine (SYNTHROID, LEVOTHROID) 150 MCG tablet Take 1 tablet (150 mcg total) by mouth daily before breakfast. 10/12/12  Yes Estela Isaiah Blakes, MD  loperamide (IMODIUM) 2 MG capsule Take 1 capsule (2 mg total) by mouth as needed for diarrhea or loose stools. 09/07/12  Yes Rhetta Mura, MD  Multiple Vitamin (MULITIVITAMIN WITH MINERALS) TABS Take 1 tablet by mouth daily.     Yes Historical Provider, MD  omeprazole (PRILOSEC) 20 MG capsule Take 20 mg by mouth daily.     Yes Historical Provider, MD  polycarbophil (FIBERCON) 625 MG tablet Take 625 mg by mouth daily.   Yes Historical Provider, MD  potassium  chloride SA (K-DUR,KLOR-CON) 20 MEQ tablet Take 20 mEq by mouth 2 (two) times daily.   Yes Historical Provider, MD  predniSONE (DELTASONE) 5 MG tablet Take 10-15 mg by mouth daily. Alternate 10mg  and 15 mg doses   Yes Historical Provider, MD  saccharomyces boulardii (FLORASTOR) 250 MG capsule Take 250 mg by mouth 2 (two) times daily.   Yes Historical Provider, MD  sertraline (ZOLOFT) 50 MG tablet Take 50 mg by mouth daily.     Yes Historical Provider, MD  simvastatin (ZOCOR) 20 MG tablet Take 20 mg by mouth at bedtime.     Yes Historical Provider, MD  sulfamethoxazole-trimethoprim (BACTRIM,SEPTRA) 400-80 MG per tablet Take 1 tablet by mouth at bedtime.   Yes Historical Provider, MD   Physical Exam: Filed Vitals:   12/04/12 1030 12/04/12 1130 12/04/12 1223 12/04/12 1255  BP: 174/87 151/69 123/71   Pulse: 107 108 110   Temp:    100.6 F (38.1 C)  TempSrc:    Oral  Resp: 22 22 24    SpO2: 97% 96% 96%      General:  Patient somnolent but arrousable, does not interact well with examiner. Does not maintain gaze  Eyes: non icteric  ENT: dry mucous membranes  Neck: supple, no goiter  Cardiovascular: RRR, No MRG  Respiratory: CTA BL, no wheezes  Abdomen: soft, NT, ND  Skin: warm and dry. Has irritated skin at perineal area  Musculoskeletal: no cyanosis  Psychiatric: unable to properly assess due to mentation  Neurologic: unable to assess due to limited interaction with examiner.  Labs on Admission:  Basic Metabolic Panel:  Recent Labs Lab 12/04/12 0640  NA 138  K 3.9  CL 100  CO2 21  GLUCOSE 140*  BUN 25*  CREATININE 1.36*  CALCIUM 9.2   Liver Function Tests:  Recent Labs Lab 12/04/12 0640  AST 19  ALT 18  ALKPHOS 81  BILITOT 0.3  PROT 7.7  ALBUMIN 3.4*    Recent Labs Lab 12/04/12 0640  LIPASE 35   No results found for this basename: AMMONIA,  in the last 168 hours CBC:  Recent Labs Lab 12/04/12 0640  WBC 8.1  HGB 13.9  HCT 42.7  MCV 90.5   PLT 262   Cardiac Enzymes: No results found for this basename: CKTOTAL, CKMB, CKMBINDEX, TROPONINI,  in the last 168 hours  BNP (last 3 results)  Recent Labs  09/03/12 0644 10/08/12 1723 10/12/12 0530  PROBNP 936.0* 1356.0* 8213.0*   CBG: No results found for this basename: GLUCAP,  in the last 168 hours  Radiological Exams on Admission: No results found.   Assessment/Plan Active Problems:   Altered mental status   Generalized weakness  Hypothyroidism   Depression   Temporal arteritis   Acute on chronic diastolic heart failure   UTI (urinary tract infection)   UTI (lower urinary tract infection) Nausea, vomiting, and diarrhea  1. AMS - At this juncture not etiology uncertain.  May be due to dehydration and sepsis vs bacterial meningitis - LP obtained in the ED - ID consult placed for further evaluation and recommendations from an infectious disease standpoint - At this juncture will cover empirically for bacterial meningitis  2. UTI - Will f/u with Urine culture - Will be placed on Rocephin and Vancomycin  3. Nausea, vomiting, and diarrhea - ? Etiology - stool cultures - c diff pcr - antiemetics - MIVF's  4. Hypothyroidism - continue home regimen if patient able to take medication - Will have to hold if patient too somnolent  5. Depression - Would like to avoid abrupt withdrawal of medication - if patient able to take medication will have nursing administer.  6. Patient has history of acute on chronic diastolic heart failure - currently looks intravascularly depleted - will place on IVF's and reassess fluids next am.   Code Status: DNR Family Communication: Daughter at bedside  Disposition Plan: Pending further work up and recommendations from specialist  Time spent: > 70 minutes  Penny Pia [facility] Pager [phone]  If 7PM-7AM, please contact night-coverage [email or web address] Password [facility] 12/04/2012, 2:53 PM

## 2012-12-04 NOTE — Progress Notes (Signed)
Peripherally Inserted Central Catheter/Midline Placement  The IV Nurse has discussed with the patient and/or persons authorized to consent for the patient, the purpose of this procedure and the potential benefits and risks involved with this procedure.  The benefits include less needle sticks, lab draws from the catheter and patient may be discharged home with the catheter.  Risks include, but not limited to, infection, bleeding, blood clot (thrombus formation), and puncture of an artery; nerve damage and irregular heat beat.  Alternatives to this procedure were also discussed.  PICC/Midline Placement Documentation  PICC / Midline Double Lumen 12/04/12 PICC Right Basilic (Active)       Christeen Douglas 12/04/2012, 9:31 PM

## 2012-12-04 NOTE — ED Provider Notes (Signed)
History     CSN: [id number]  Arrival date & time 12/04/12  4098   First MD Initiated Contact with Patient 12/04/12 [phone]      Chief Complaint  Patient presents with  . Nausea  . Emesis  . Diarrhea    (Consider location/radiation/quality/duration/timing/severity/associated sxs/prior treatment) HPI Comments: Patient is an 79 year old female with a past medical history of dementia and hypertension who presents from [facility] nursing home with nausea, vomiting and diarrhea since last night. Patient reports gradual onset and progressive worsening of symptoms since the onset. No associated symptoms. No aggravating/alleviating factors. Patient did not try anything for symptoms. Patient reports other residents of her nursing home have the same symptoms.     Past Medical History  Diagnosis Date  . Peripheral neuropathy   . Hypertension   . TIA (transient ischemic attack)   . Elevated sed rate   . Temporal arteritis     chronic steroids  . Hypothyroidism   . Colon cancer     colon  . History of hernia repair   . S/P rotator cuff surgery     right  . Diastolic CHF   . Cardiomegaly - hypertensive   . Chronic anemia   . Chronic steroid use   . Dementia   . Shortness of breath   . Hyperlipidemia   . CHF (congestive heart failure)   . Cardiomyopathy due to hypertension   . Dependence on supplemental oxygen   . Chronic UTI   . Dementia     Past Surgical History  Procedure Laterality Date  . Tonsillectomy    . Appendectomy    . Abdominal hysterectomy    . Colectomy      No family history on file.  History  Substance Use Topics  . Smoking status: Never Smoker   . Smokeless tobacco: Not on file  . Alcohol Use: No    OB History   Grav Para Term Preterm Abortions TAB SAB Ect Mult Living                  Review of Systems  Gastrointestinal: Positive for nausea, vomiting and diarrhea.  All other systems reviewed and are negative.    Allergies  Apap;  Ciprofloxacin; Codeine; Demerol; Morphine and related; Nitrofurantoin monohyd macro; Pregabalin; and Septra  Home Medications   Current Outpatient Rx  Name  Route  Sig  Dispense  Refill  . acetaminophen (TYLENOL) 325 MG tablet   Oral   Take 650 mg by mouth every 6 (six) hours as needed. For pain         . amLODipine (NORVASC) 5 MG tablet   Oral   Take 5 mg by mouth daily.         . Cranberry 200 MG CAPS   Oral   Take 200 mg by mouth 2 (two) times daily.         Marland Kitchen dipyridamole-aspirin (AGGRENOX) 25-200 MG per 12 hr capsule   Oral   Take 1 capsule by mouth 2 (two) times daily.           Marland Kitchen donepezil (ARICEPT) 10 MG tablet   Oral   Take 10 mg by mouth at bedtime.          Marland Kitchen levothyroxine (SYNTHROID, LEVOTHROID) 150 MCG tablet   Oral   Take 1 tablet (150 mcg total) by mouth daily before breakfast.         . loperamide (IMODIUM) 2 MG capsule   Oral  Take 1 capsule (2 mg total) by mouth as needed for diarrhea or loose stools.   30 capsule   0   . Multiple Vitamin (MULITIVITAMIN WITH MINERALS) TABS   Oral   Take 1 tablet by mouth daily.           Marland Kitchen omeprazole (PRILOSEC) 20 MG capsule   Oral   Take 20 mg by mouth daily.           . polycarbophil (FIBERCON) 625 MG tablet   Oral   Take 625 mg by mouth daily.         . potassium chloride SA (K-DUR,KLOR-CON) 20 MEQ tablet   Oral   Take 20 mEq by mouth 2 (two) times daily.         . predniSONE (DELTASONE) 5 MG tablet   Oral   Take 10-15 mg by mouth daily. Alternate 10mg  and 15 mg doses         . saccharomyces boulardii (FLORASTOR) 250 MG capsule   Oral   Take 250 mg by mouth 2 (two) times daily.         . sertraline (ZOLOFT) 50 MG tablet   Oral   Take 50 mg by mouth daily.           . simvastatin (ZOCOR) 20 MG tablet   Oral   Take 20 mg by mouth at bedtime.           . sulfamethoxazole-trimethoprim (BACTRIM,SEPTRA) 400-80 MG per tablet   Oral   Take 1 tablet by mouth at bedtime.            BP 189/87  Pulse 112  Temp(Src) 98.1 F (36.7 C)  Resp 23  SpO2 95%  Physical Exam  Nursing note and vitals reviewed. Constitutional: She is oriented to person, place, and time. She appears well-developed and well-nourished. No distress.  HENT:  Head: Normocephalic and atraumatic.  Eyes: Conjunctivae and EOM are normal. Pupils are equal, round, and reactive to light. No scleral icterus.  Neck: Normal range of motion. Neck supple.  Cardiovascular: Normal rate and regular rhythm.  Exam reveals no gallop and no friction rub.   No murmur heard. Pulmonary/Chest: Effort normal and breath sounds normal. She has no wheezes. She has no rales. She exhibits no tenderness.  Abdominal: Soft. She exhibits no distension. There is no tenderness. There is no rebound and no guarding.  Musculoskeletal: Normal range of motion.  Neurological: She is alert and oriented to person, place, and time. Coordination normal.  Speech is goal-oriented. Moves limbs without ataxia.   Skin: Skin is warm and dry.  Psychiatric: She has a normal mood and affect. Her behavior is normal.    ED Course  Procedures (including critical care time)  Labs Reviewed  COMPREHENSIVE METABOLIC PANEL - Abnormal; Notable for the following:    Glucose, Bld 140 (*)    BUN 25 (*)    Creatinine, Ser 1.36 (*)    Albumin 3.4 (*)    GFR calc non Af Amer 36 (*)    GFR calc Af Amer 41 (*)    All other components within normal limits  URINALYSIS, DIPSTICK ONLY - Abnormal; Notable for the following:    Hgb urine dipstick SMALL (*)    Bilirubin Urine SMALL (*)    Protein, ur 30 (*)    Leukocytes, UA LARGE (*)    All other components within normal limits  URINALYSIS, ROUTINE W REFLEX MICROSCOPIC - Abnormal; Notable for the following:  APPearance TURBID (*)    Hgb urine dipstick SMALL (*)    Bilirubin Urine SMALL (*)    Protein, ur 30 (*)    Leukocytes, UA LARGE (*)    All other components within normal limits  URINE  MICROSCOPIC-ADD ON - Abnormal; Notable for the following:    Bacteria, UA MANY (*)    Casts HYALINE CASTS (*)    All other components within normal limits  CSF CELL COUNT WITH DIFFERENTIAL - Abnormal; Notable for the following:    Appearance, CSF CLEAR (*)    RBC Count, CSF 5 (*)    All other components within normal limits  GLUCOSE, CSF - Abnormal; Notable for the following:    Glucose, CSF 83 (*)    All other components within normal limits  BASIC METABOLIC PANEL - Abnormal; Notable for the following:    Glucose, Bld 158 (*)    BUN 33 (*)    Creatinine, Ser 1.98 (*)    Calcium 7.9 (*)    GFR calc non Af Amer 23 (*)    GFR calc Af Amer 26 (*)    All other components within normal limits  CBC - Abnormal; Notable for the following:    Hemoglobin 11.9 (*)    All other components within normal limits  PHOSPHORUS - Abnormal; Notable for the following:    Phosphorus 4.8 (*)    All other components within normal limits  LACTIC ACID, PLASMA - Abnormal; Notable for the following:    Lactic Acid, Venous 4.1 (*)    All other components within normal limits  GRAM STAIN  MRSA PCR SCREENING  URINE CULTURE  CSF CULTURE  STOOL CULTURE  CLOSTRIDIUM DIFFICILE BY PCR  CULTURE, BLOOD (ROUTINE X 2)  CULTURE, BLOOD (ROUTINE X 2)  CLOSTRIDIUM DIFFICILE BY PCR  CBC  LIPASE, BLOOD  PROTEIN, CSF  MAGNESIUM  PROCALCITONIN  PROCALCITONIN  GI PATHOGEN PANEL BY PCR, STOOL  LACTIC ACID, PLASMA   Dg Chest Port 1 View  12/04/2012  *RADIOLOGY REPORT*  Clinical Data: Shortness of breath.  Congestive heart failure.  PORTABLE CHEST - 1 VIEW  Comparison: 10/08/2012  Findings: Cardiomegaly remains stable.  Mild pleural thickening in the left costophrenic sulcus is unchanged.  No evidence of acute infiltrate or edema.  IMPRESSION: Stable cardiomegaly.  No acute findings.   Original Report Authenticated By: Myles Rosenthal, M.D.      1. Altered mental status   2. Chronic diastolic heart failure   3.  Hypothyroidism   4. Nausea vomiting and diarrhea   5. Temporal arteritis   6. UTI (urinary tract infection)       MDM  7:08 AM Patient given fluids and zofran. Patient reports feeling better. Labs pending. Urinalysis shows UTI.   1:26 PM Patient started on IV Rocephin and is now febrile. Patient likely has pyelonephritis. I will call for admission.       Emilia Beck, [place] 12/05/12 [phone]

## 2012-12-04 NOTE — Progress Notes (Addendum)
ANTIBIOTIC CONSULT NOTE - INITIAL  Pharmacy Consult for Vancomycin, Rocephin, Acyclovir, Ampicillin Indication: meningitis  Allergies  Allergen Reactions  . Apap (Acetaminophen)     Unknown  . Ciprofloxacin Nausea And Vomiting  . Codeine Other (See Comments)    halucinations  . Demerol Other (See Comments)    halucinations  . Morphine And Related Other (See Comments)    halucinations  . Nitrofurantoin Monohyd Macro Other (See Comments)    Per MAR  . Pregabalin Other (See Comments)    unknown  . Septra (Bactrim) Other (See Comments)    Unknown cant tolerate DS    Patient Measurements: Height: 5' 8.9" (175 cm) (as documented 10/12/12) Weight: 196 lb 10.4 oz (89.2 kg) (as documented 10/12/12) IBW/kg (Calculated) : 65.97  Vital Signs: Temp: 102.9 F (39.4 C) (03/11 1510) Temp src: Rectal (03/11 1510) BP: 123/71 mmHg (03/11 1223) Pulse Rate: 110 (03/11 1223) Intake/Output from previous day: 03/10 0701 - 03/11 0700 In: -  Out: 1 [Stool:1] Intake/Output from this shift:    Labs:  Recent Labs  12/04/12 0640  WBC 8.1  HGB 13.9  PLT 262  CREATININE 1.36*   Estimated Creatinine Clearance: 39.2 ml/min (by C-G formula based on Cr of 1.36). No results found for this basename: VANCOTROUGH, VANCOPEAK, VANCORANDOM, GENTTROUGH, GENTPEAK, GENTRANDOM, TOBRATROUGH, TOBRAPEAK, TOBRARND, AMIKACINPEAK, AMIKACINTROU, AMIKACIN,  in the last 72 hours   Microbiology: No results found for this or any previous visit (from the past 720 hour(s)).  Medical History: Past Medical History  Diagnosis Date  . Peripheral neuropathy   . Hypertension   . TIA (transient ischemic attack)   . Elevated sed rate   . Temporal arteritis     chronic steroids  . Hypothyroidism   . Colon cancer     colon  . History of hernia repair   . S/P rotator cuff surgery     right  . Diastolic CHF   . Cardiomegaly - hypertensive   . Chronic anemia   . Chronic steroid use   . Dementia   . Shortness of  breath   . Hyperlipidemia   . CHF (congestive heart failure)   . Cardiomyopathy due to hypertension   . Dependence on supplemental oxygen   . Chronic UTI   . Dementia     Assessment:  67 yof with recent bacterial meningitis 6 weeks ago treated with ampicillin/ceftriaxone/vancomycin (10 days given negative cultures) presented 3/11 with N/V/D.  UA in the ED c/w UTI.  Patient received Rocephin 1gm IV x 1 in the ED prior to blood cultures or LP.  MD ordered for pharmacy to dose Vanc, Rocephin, Ampicillin and Acyclovir for meningitis coverage.    Scr 1.36, CG CrCl 39 ml/min, normalized CrCl 37.5 ml/min.  Pending LP and culture, urine cx and C.diff PCR.  Patient is afebrile, WBC wnl.    Goal of Therapy:  Vancomycin trough level 15-20 mcg/ml Appropriate renal dosing of other antibiotics  Plan:   Rocephin 2gm IV q12h  Ampicillin 2gm IV q6h  Vancomycin 1250 mg IV q24h  Acyclovir 10mg /kg (900 mg) IV q12h   Follow up new weight/height and adjust  Geoffry Paradise, PharmD, BCPS Pager: [phone] 3:27 PM Pharmacy #: [id number]

## 2012-12-04 NOTE — Progress Notes (Signed)
CSW attempted to assess patient. Pt currently soundly sleeping, and being admitted to the inpatient floor. CSW will inform unit csw of patient needs. Per chart review pt is resident at [facility].   Catha Gosselin, LCSWA  [phone] .12/04/2012 1658pm

## 2012-12-04 NOTE — ED Notes (Signed)
Pt to ED via EMS from [facility]; PT c/o N/V/D x 2 days; worse this am; also c/o chills and general malaise.

## 2012-12-04 NOTE — ED Notes (Signed)
Report given to kim, rn on floor

## 2012-12-04 NOTE — ED Notes (Signed)
Pt more alert, daughter reports pt now knows where she is at.

## 2012-12-04 NOTE — Progress Notes (Signed)
Consulted PCCM due to hypotension in setting of active infection meeting SIRs criteria in patient with pulmonary hypertension and h/o CHF(diastolic) with increase in SOB after IVF administration.  PCCM will follow with [place] and make further recommendations  VEGA, Pamala Hurry

## 2012-12-04 NOTE — Progress Notes (Signed)
Diarrhea   Flexiseal ordered

## 2012-12-04 NOTE — Consult Note (Signed)
INFECTIOUS DISEASE CONSULT NOTE  Date of Admission:  12/04/2012  Date of Consult:  12/04/2012  Reason for Consult: fever, mental status change Referring Physician: vega  Impression/Recommendation Fever Metal status change Worsened renal function Recent CSF leukocytosis Urinary incontinence  Would:  ICU admission Check C diff Stool for Noro, enteric pathogens Enteric/contact isolation Await LP Empiric meningitis anbx ordered Empiric C diff rx  Comment- hesitant to say that her urine is causing this given her UA and her chronic problems. I suspect that the GI illness that is going around her community is most likely cause of her syndrome, she is quite ill. She was recently treated for meningitis and a recurrence seems unlikely. Her predominence of GI symptoms does not correlate with this (although enterovirus can cause aseptic meningitis). Her recent anbx treatment does raise the spectre of C diff.   Thank you so much for this interesting consult,   [name] [phone]  [name] is an 79 y.o. female.  HPI: 43 F with hx of TIA, chronic UTI/incontence, temporal arteritis on chronic prednisone. Recently admitted 1-14 to 1-17 for mental status change. She was treated with vanco/ceftriaxone/amp/decadron for 10 days. Her CSF showed 480 WBC (89% N) but Cx and BCx were (-). She returns today with diarrhea and nausea/vommitting. Apparently there is an outbreak of the same at her SNF (6 other patients).  In ED she has temp 102.9 and is hypotensive.  I spoke with SNF and "I don't know anymore about it than you do".   Past Medical History  Diagnosis Date  . Peripheral neuropathy   . Hypertension   . TIA (transient ischemic attack)   . Elevated sed rate   . Temporal arteritis     chronic steroids  . Hypothyroidism   . Colon cancer     colon  . History of hernia repair   . S/P rotator cuff surgery     right  . Diastolic CHF   . Cardiomegaly - hypertensive   . Chronic  anemia   . Chronic steroid use   . Dementia   . Shortness of breath   . Hyperlipidemia   . CHF (congestive heart failure)   . Cardiomyopathy due to hypertension   . Dependence on supplemental oxygen   . Chronic UTI   . Dementia     Past Surgical History  Procedure Laterality Date  . Tonsillectomy    . Appendectomy    . Abdominal hysterectomy    . Colectomy       Allergies  Allergen Reactions  . Apap (Acetaminophen)     Unknown  . Ciprofloxacin Nausea And Vomiting  . Codeine Other (See Comments)    halucinations  . Demerol Other (See Comments)    halucinations  . Morphine And Related Other (See Comments)    halucinations  . Nitrofurantoin Monohyd Macro Other (See Comments)    Per MAR  . Pregabalin Other (See Comments)    unknown  . Septra (Bactrim) Other (See Comments)    Unknown cant tolerate DS    Medications: Scheduled:   Total days of antibiotics    Social History:  reports that she has never smoked. She has never used smokeless tobacco. She reports that she does not drink alcohol or use illicit drugs.  History reviewed. No pertinent family history.  pt awakens onlyminimmally, unobtainable. SNF does not know her hx and neither does her dauther. SNF does not know if other residents have been tested for noro or c diff.  Blood pressure 123/71, pulse 110, temperature 102.9 F (39.4 C), temperature source Rectal, resp. rate 21, height 5' 8.9" (1.75 m), weight 89.2 kg (196 lb 10.4 oz), SpO2 96.00%. General appearance: severe distress, pale and awakens transiently, makes no meaningful speech.  Eyes: pupils unequal, R slightly larger than L. EOMI. puils reactive.  Throat: abnormal findings: dry Neck: no adenopathy, supple, symmetrical, trachea midline and shemoves her head (passivley) from side to side without difficulty. she resists anterior flexion butwith repeated attempts isable to flex further and further (passively) Lungs: clear to auscultation  bilaterally Heart: regular rate and rhythm Abdomen: normal findings: bowel sounds normal and soft, non-tender Extremities: edema 1-2+ and LE warm.   Results for orders placed during the hospital encounter of 12/04/12 (from the past 48 hour(s))  URINALYSIS, DIPSTICK ONLY     Status: Abnormal   Collection Time    12/04/12  6:19 AM      Result Value Range   Specific Gravity, Urine 1.024  1.005 - 1.030   pH 5.5  5.0 - 8.0   Glucose, UA NEGATIVE  NEGATIVE mg/dL   Hgb urine dipstick SMALL (*) NEGATIVE   Bilirubin Urine SMALL (*) NEGATIVE   Ketones, ur NEGATIVE  NEGATIVE mg/dL   Protein, ur 30 (*) NEGATIVE mg/dL   Urobilinogen, UA 0.2  0.0 - 1.0 mg/dL   Nitrite NEGATIVE  NEGATIVE   Leukocytes, UA LARGE (*) NEGATIVE  URINALYSIS, ROUTINE W REFLEX MICROSCOPIC     Status: Abnormal   Collection Time    12/04/12  6:19 AM      Result Value Range   Color, Urine YELLOW  YELLOW   APPearance TURBID (*) CLEAR   Specific Gravity, Urine 1.024  1.005 - 1.030   pH 5.5  5.0 - 8.0   Glucose, UA NEGATIVE  NEGATIVE mg/dL   Hgb urine dipstick SMALL (*) NEGATIVE   Bilirubin Urine SMALL (*) NEGATIVE   Ketones, ur NEGATIVE  NEGATIVE mg/dL   Protein, ur 30 (*) NEGATIVE mg/dL   Urobilinogen, UA 0.2  0.0 - 1.0 mg/dL   Nitrite NEGATIVE  NEGATIVE   Leukocytes, UA LARGE (*) NEGATIVE  URINE MICROSCOPIC-ADD ON     Status: Abnormal   Collection Time    12/04/12  6:19 AM      Result Value Range   Squamous Epithelial / LPF RARE  RARE   WBC, UA 21-50  <3 WBC/hpf   RBC / HPF 3-6  <3 RBC/hpf   Bacteria, UA MANY (*) RARE   Casts HYALINE CASTS (*) NEGATIVE   Urine-Other MUCOUS PRESENT    CBC     Status: None   Collection Time    12/04/12  6:40 AM      Result Value Range   WBC 8.1  4.0 - 10.5 K/uL   RBC 4.72  3.87 - 5.11 MIL/uL   Hemoglobin 13.9  12.0 - 15.0 g/dL   HCT 16.1  09.6 - 04.5 %   MCV 90.5  78.0 - 100.0 fL   MCH 29.4  26.0 - 34.0 pg   MCHC 32.6  30.0 - 36.0 g/dL   RDW 40.9  81.1 - 91.4 %    Platelets 262  150 - 400 K/uL  COMPREHENSIVE METABOLIC PANEL     Status: Abnormal   Collection Time    12/04/12  6:40 AM      Result Value Range   Sodium 138  135 - 145 mEq/L   Potassium 3.9  3.5 - 5.1 mEq/L  Chloride 100  96 - 112 mEq/L   CO2 21  19 - 32 mEq/L   Glucose, Bld 140 (*) 70 - 99 mg/dL   BUN 25 (*) 6 - 23 mg/dL   Creatinine, Ser 1.61 (*) 0.50 - 1.10 mg/dL   Calcium 9.2  8.4 - 09.6 mg/dL   Total Protein 7.7  6.0 - 8.3 g/dL   Albumin 3.4 (*) 3.5 - 5.2 g/dL   AST 19  0 - 37 U/L   ALT 18  0 - 35 U/L   Alkaline Phosphatase 81  39 - 117 U/L   Total Bilirubin 0.3  0.3 - 1.2 mg/dL   GFR calc non Af Amer 36 (*) >90 mL/min   GFR calc Af Amer 41 (*) >90 mL/min   Comment:            The eGFR has been calculated     using the CKD EPI equation.     This calculation has not been     validated in all clinical     situations.     eGFR's persistently     <90 mL/min signify     possible Chronic Kidney Disease.  LIPASE, BLOOD     Status: None   Collection Time    12/04/12  6:40 AM      Result Value Range   Lipase 35  11 - 59 U/L      Component Value Date/Time   SDES CSF 10/08/2012 2158   SDES CSF 10/08/2012 2158   SPECREQUEST NONE 10/08/2012 2158   SPECREQUEST NO 2 1CC 10/08/2012 2158   CULT NO GROWTH 3 DAYS 10/08/2012 2158   REPTSTATUS 10/12/2012 FINAL 10/08/2012 2158   REPTSTATUS 10/08/2012 FINAL 10/08/2012 2158   No results found. No results found for this or any previous visit (from the past 240 hour(s)).    12/04/2012, 3:39 PM     LOS: 0 days

## 2012-12-04 NOTE — ED Notes (Signed)
hospitalist at bedside. Aware of blood pressure

## 2012-12-04 NOTE — ED Notes (Signed)
ED tech in room to draw blood and reports to nurse that pt had an episode of vomiting; reports that pt was making a "gurgling" sound and had emesis coming out of the corners of her mouth; pt was turned to the side and a yaunker was used to clear the emesis; pt denies difficulty breathing at present.

## 2012-12-04 NOTE — ED Notes (Signed)
Infection control at bedside.

## 2012-12-04 NOTE — ED Notes (Signed)
Pt vomiting yellow bile. Bile running out both sides of her mouth. rn cleaned pt up and changed gown.

## 2012-12-05 DIAGNOSIS — A0472 Enterocolitis due to Clostridium difficile, not specified as recurrent: Secondary | ICD-10-CM

## 2012-12-05 DIAGNOSIS — N179 Acute kidney failure, unspecified: Secondary | ICD-10-CM

## 2012-12-05 DIAGNOSIS — A419 Sepsis, unspecified organism: Secondary | ICD-10-CM

## 2012-12-05 DIAGNOSIS — E860 Dehydration: Secondary | ICD-10-CM

## 2012-12-05 LAB — BASIC METABOLIC PANEL
CO2: 21 mEq/L (ref 19–32)
Calcium: 7.9 mg/dL — ABNORMAL LOW (ref 8.4–10.5)
Chloride: 106 mEq/L (ref 96–112)
Creatinine, Ser: 1.98 mg/dL — ABNORMAL HIGH (ref 0.50–1.10)
Glucose, Bld: 158 mg/dL — ABNORMAL HIGH (ref 70–99)
Sodium: 140 mEq/L (ref 135–145)

## 2012-12-05 LAB — CBC
HCT: 37.8 % (ref 36.0–46.0)
MCH: 28.9 pg (ref 26.0–34.0)
MCV: 91.7 fL (ref 78.0–100.0)
Platelets: 264 10*3/uL (ref 150–400)
RBC: 4.12 MIL/uL (ref 3.87–5.11)

## 2012-12-05 LAB — LACTIC ACID, PLASMA: Lactic Acid, Venous: 2 mmol/L (ref 0.5–2.2)

## 2012-12-05 MED ORDER — AMLODIPINE BESYLATE 5 MG PO TABS
5.0000 mg | ORAL_TABLET | Freq: Every day | ORAL | Status: DC
  Administered 2012-12-05 – 2012-12-06 (×2): 5 mg via ORAL
  Filled 2012-12-05 (×2): qty 1

## 2012-12-05 MED ORDER — CEFTRIAXONE SODIUM 1 G IJ SOLR
1.0000 g | INTRAMUSCULAR | Status: DC
  Administered 2012-12-06 – 2012-12-07 (×2): 1 g via INTRAVENOUS
  Filled 2012-12-05 (×2): qty 10

## 2012-12-05 MED ORDER — SODIUM CHLORIDE 0.9 % IV SOLN: INTRAVENOUS | Status: DC

## 2012-12-05 MED ORDER — METRONIDAZOLE IN NACL 5-0.79 MG/ML-% IV SOLN
500.0000 mg | Freq: Three times a day (TID) | INTRAVENOUS | Status: DC
  Administered 2012-12-05 – 2012-12-06 (×3): 500 mg via INTRAVENOUS
  Filled 2012-12-05 (×4): qty 100

## 2012-12-05 NOTE — Clinical Social Work Psychosocial (Signed)
Clinical Social Work Department BRIEF PSYCHOSOCIAL ASSESSMENT 12/05/2012  Patient:  [name], [name]     Account Number:  [id number]     Admit date:  12/04/2012  Clinical Social Worker:  Jodelle Red  Date/Time:  12/05/2012 10:15 AM  Referred by:  Physician  Date Referred:  12/05/2012 Referred for  Other - See comment   Other Referral:   ADMITTED FROM ALF, [facility]   Interview type:  Family Other interview type:   DISCUSSION WITH DAUGHTER OVER THE PHONE.  CHART REVIEW.    PSYCHOSOCIAL DATA Living Status:  FACILITY Admitted from facility:  [facility] Level of care:  Assisted Living Primary support name:  [name] Primary support relationship to patient:  CHILD, ADULT Degree of support available:   GOOD FROM DAUGHTER    CURRENT CONCERNS Current Concerns  Post-Acute Placement   Other Concerns:   RETURN TO ALF WHEN STABLE    SOCIAL WORK ASSESSMENT / PLAN CSW spoke with daughter, [name] over the phione to check in and assess needs. Pt resides at ALF, [facility] since July of last year. Per daughter, Pt loves it there and plans to return once stable.  Daughter reports Pt uses a walker there and was doing well.   Assessment/plan status:  Psychosocial Support/Ongoing Assessment of Needs Other assessment/ plan:   assist with return to ALF   Information/referral to community resources:    PATIENT'S/FAMILY'S RESPONSE TO PLAN OF CARE: Daughter agrees to CSW following for return to ALF. If Pt needs more assistance, SNF may be more appropriate.    Karen Salvage, LCSW ICU/Stepdown Clinical Social Worker [facility] Cell [phone] Hours 8am-1200pm M-F

## 2012-12-05 NOTE — Progress Notes (Signed)
CARE MANAGEMENT NOTE 12/05/2012  Patient:  [name], [name]   Account Number:  [id number]  Date Initiated:  12/05/2012  Documentation initiated by:  DAVIS,RHONDA  Subjective/Objective Assessment:   patient with poss sepsis ,gi viral infection versus c.diff. admitted with hypotension requiring iv blous,     Action/Plan:   from assisted living at [facility] house   Anticipated DC Date:  12/08/2012   Anticipated DC Plan:  ASSISTED LIVING / REST HOME  In-house referral  Clinical Social Worker      DC Planning Services  NA      [facility] Choice  NA   Choice offered to / List presented to:  NA   DME arranged  NA      DME agency  NA     HH arranged  NA      HH agency  NA   Status of service:  In process, will continue to follow Medicare Important Message given?  NA - LOS <3 / Initial given by admissions (If response is "NO", the following Medicare IM given date fields will be blank) Date Medicare IM given:   Date Additional Medicare IM given:    Discharge Disposition:    Per UR Regulation:  Reviewed for med. necessity/level of care/duration of stay  If discussed at Long Length of Stay Meetings, dates discussed:    Comments:  [id number]/Rhonda Earlene Plater, RN, BSN, CCM:  CHART REVIEWED AND UPDATED.  Next chart review due on [id number]. NO DISCHARGE NEEDS PRESENT AT THIS TIME. CASE MANAGEMENT [phone]

## 2012-12-05 NOTE — Progress Notes (Signed)
Nutrition Brief Note  Patient identified on the Malnutrition Screening Tool (MST) Report  Body mass index is 28.22 kg/(m^2). Patient meets criteria for Overweight based on current BMI. Pt thinks she usually weighs around 180 lbs. Per recorded wt history pt has maintained wt at approximately 200 lbs for the past year. Weight 12/04/12 was 196 lbs. Current diet order is Full Liquids, patient is consuming approximately 100% of meals at this time. Pt is tolerating full liquids well. Pt reports having a good appetite and eating well PTA- 3 meals daily. Pt continues to have diarrhea; discussed diet tips to help relieve diarrhea. Labs and medications reviewed.   No nutrition interventions warranted at this time. If nutrition issues arise, please consult RD.   Ian Malkin RD, LDN Inpatient Clinical Dietitian Pager: [phone] After Hours Pager: [phone]

## 2012-12-05 NOTE — Progress Notes (Signed)
INFECTIOUS DISEASE PROGRESS NOTE  ID: [name] is a 79 y.o. female with  Active Problems:   Generalized weakness   Altered mental status   Hypothyroidism   Depression   Temporal arteritis   Acute on chronic diastolic heart failure   UTI (urinary tract infection)   UTI (lower urinary tract infection)   Nausea vomiting and diarrhea   Dehydration   Diarrhea  Subjective: More awake and interactive. Denies nausea vomiting. She is unclear she's had the loose bowel movements. She has a flexiseal continues to have loose watery stools into this.  Abtx:  Anti-infectives   Start     Dose/Rate Route Frequency Ordered Stop   12/06/12 0300  cefTRIAXone (ROCEPHIN) 1 g in dextrose 5 % 50 mL IVPB     1 g 100 mL/hr over 30 Minutes Intravenous Every 24 hours 12/05/12 0911     12/05/12 0600  vancomycin (VANCOCIN) 50 mg/mL oral solution 125 mg     125 mg Oral 4 times per day 12/04/12 2246     12/04/12 1700  vancomycin (VANCOCIN) 50 mg/mL oral solution 125 mg  Status:  Discontinued     125 mg Oral 4 times per day 12/04/12 1610 12/04/12 2246   12/04/12 1615  metroNIDAZOLE (FLAGYL) IVPB 500 mg  Status:  Discontinued     500 mg 100 mL/hr over 60 Minutes Intravenous Every 8 hours 12/04/12 1610 12/05/12 0904   12/04/12 1600  cefTRIAXone (ROCEPHIN) 2 g in dextrose 5 % 50 mL IVPB  Status:  Discontinued     2 g 100 mL/hr over 30 Minutes Intravenous Every 12 hours 12/04/12 1528 12/05/12 0911   12/04/12 1600  vancomycin (VANCOCIN) 1,250 mg in sodium chloride 0.9 % 250 mL IVPB  Status:  Discontinued     1,250 mg 166.7 mL/hr over 90 Minutes Intravenous Every 24 hours 12/04/12 1528 12/05/12 0911   12/04/12 1600  acyclovir (ZOVIRAX) 900 mg in dextrose 5 % 150 mL IVPB  Status:  Discontinued     900 mg 168 mL/hr over 60 Minutes Intravenous Every 12 hours 12/04/12 1528 12/05/12 0911   12/04/12 1600  ampicillin (OMNIPEN) 2 g in sodium chloride 0.9 % 50 mL IVPB  Status:  Discontinued     2 g 150 mL/hr  over 20 Minutes Intravenous 6 times per day 12/04/12 1528 12/04/12 1540   12/04/12 1600  ampicillin (OMNIPEN) 2 g in sodium chloride 0.9 % 50 mL IVPB  Status:  Discontinued     2 g 150 mL/hr over 20 Minutes Intravenous Every 6 hours 12/04/12 1540 12/05/12 0911   12/04/12 0830  cefTRIAXone (ROCEPHIN) 1 g in dextrose 5 % 50 mL IVPB     1 g 100 mL/hr over 30 Minutes Intravenous  Once 12/04/12 0823 12/04/12 1020      Medications:  Scheduled: . [START ON 12/06/2012] cefTRIAXone (ROCEPHIN)  IV  1 g Intravenous Q24H  . dipyridamole-aspirin  1 capsule Oral BID  . donepezil  10 mg Oral QHS  . hydrocortisone sod succinate (SOLU-CORTEF) injection  50 mg Intravenous Q6H  . levothyroxine  150 mcg Oral QAC breakfast  . saccharomyces boulardii  250 mg Oral BID  . sertraline  50 mg Oral Daily  . simvastatin  20 mg Oral QHS  . vancomycin  125 mg Oral Q6H    Objective: Vital signs in last 24 hours: Temp:  [98.2 F (36.8 C)-102.9 F (39.4 C)] 98.2 F (36.8 C) (03/12 1200) Pulse Rate:  [72-115] 91 (  03/12 1306) Resp:  [13-29] 19 (03/12 1306) BP: (81-181)/(35-97) 169/93 mmHg (03/12 1306) SpO2:  [89 %-100 %] 100 % (03/12 1306) Weight:  [89.2 kg (196 lb 10.4 oz)] 89.2 kg (196 lb 10.4 oz) (03/11 1500)   General appearance: alert, cooperative, no distress and She is significantly more alert and cooperative today. Resp: clear to auscultation bilaterally Cardio: regular rate and rhythm GI: normal findings: bowel sounds normal and soft, non-tender  Lab Results  Recent Labs  12/04/12 0640 12/05/12 0530  WBC 8.1 9.9  HGB 13.9 11.9*  HCT 42.7 37.8  NA 138 140  K 3.9 4.4  CL 100 106  CO2 21 21  BUN 25* 33*  CREATININE 1.36* 1.98*   Liver Panel  Recent Labs  12/04/12 0640  PROT 7.7  ALBUMIN 3.4*  AST 19  ALT 18  ALKPHOS 81  BILITOT 0.3   Sedimentation Rate No results found for this basename: ESRSEDRATE,  in the last 72 hours C-Reactive Protein No results found for this basename:  CRP,  in the last 72 hours  Microbiology: Recent Results (from the past 240 hour(s))  URINE CULTURE     Status: None   Collection Time    12/04/12  6:19 AM      Result Value Range Status   Specimen Description URINE, CLEAN CATCH   Final   Special Requests NONE   Final   Culture  Setup Time 12/04/2012 16:30   Final   Colony Count >=100,000 COLONIES/ML   Final   Culture ESCHERICHIA COLI   Final   Report Status PENDING   Incomplete  CSF CULTURE     Status: None   Collection Time    12/04/12  3:17 PM      Result Value Range Status   Specimen Description CSF   Final   Special Requests NONE   Final   Gram Stain     Final   Value: WBC PRESENT, PREDOMINANTLY MONONUCLEAR     NO ORGANISMS SEEN     CYTOSPUN   Culture PENDING   Incomplete   Report Status PENDING   Incomplete  GRAM STAIN     Status: None   Collection Time    12/04/12  3:17 PM      Result Value Range Status   Specimen Description CSF   Final   Special Requests NONE   Final   Gram Stain     Final   Value: CYTOSPIN     NO WBC SEEN     NO ORGANISMS SEEN   Report Status 12/04/2012 FINAL   Final  MRSA PCR SCREENING     Status: None   Collection Time    12/04/12  8:03 PM      Result Value Range Status   MRSA by PCR NEGATIVE  NEGATIVE Final   Comment:            The GeneXpert MRSA Assay (FDA     approved for NASAL specimens     only), is one component of a     comprehensive MRSA colonization     surveillance program. It is not     intended to diagnose MRSA     infection nor to guide or     monitor treatment for     MRSA infections.  CLOSTRIDIUM DIFFICILE BY PCR     Status: Abnormal   Collection Time    12/04/12 10:53 PM      Result Value Range Status   C difficile  by pcr POSITIVE (*) NEGATIVE Final   Comment: CRITICAL RESULT CALLED TO, READ BACK BY AND VERIFIED WITH:     G [facility] 12/05/12 0905 BY K SCHULTZ    Studies/Results: Dg Chest Port 1 View  12/04/2012  *RADIOLOGY REPORT*  Clinical Data: Shortness  of breath.  Congestive heart failure.  PORTABLE CHEST - 1 VIEW  Comparison: 10/08/2012  Findings: Cardiomegaly remains stable.  Mild pleural thickening in the left costophrenic sulcus is unchanged.  No evidence of acute infiltrate or edema.  IMPRESSION: Stable cardiomegaly.  No acute findings.   Original Report Authenticated By: Myles Rosenthal, M.D.      Assessment/Plan: Sepsis C diff ? UTI: urine culture greater than 100,000 colonies of Escherichia coli Acute Renal failure Total days of antibiotics 2 (oral vancomycin, ceftriaxone) We will add IV Flagyl. Await sensitivities from her urine cx. Plan for short course of antibiotics for her urine, 5 days. She appears to be improving  I left a message with her skilled facility of her diagnosis and need to screen other patients who had diarrhea recently         [name] Infectious Diseases [phone] 12/05/2012, 2:26 PM   LOS: 1 day

## 2012-12-05 NOTE — Progress Notes (Signed)
[facility] PROGRESS NOTE  [name] HYQ:[id number] DOB: 1930/02/05 DOA: 12/04/2012 PCP: Cain Saupe, MD  Brief narrative  79 year old female with a history off hypertension, hypothyroidism, dementia, diastolic CHF recently treated bacterial meningitis was sent from skilled nursing facility with nausea and vomiting and profuse diarrhea secondary to suspected viral GI outbreak admitted with fever and altered mental status.   Assessment/Plan: Altered mental status Likely in the setting of dehydration and sepsis secondary to diarrhea. Patient presented with low blood pressure, fever and elevated lactate which has now resolved. An LP was done in the ED which is unremarkable. Gram stain is negative. Cultures pending. Patient was empirically started on IV vancomycin, ceftriaxone, ampicillin and acyclovir. Appreciate ID recommendations. LP done on admission is unremarkable for bacterial meningitis. I will discontinue empiric coverage. Given her diarrhea and being recently on antibiotics, C. difficile is very likely. Patient was started on IV Flagyl on admission. Ordered oral vancomycin by [facility] this morning. Will continue her on oral vancomycin given her sepsis and discontinue Flagyl. - Follow stool for C. difficile, blood culture and CSF culture. UA on admission also suggestive of UTI. I will continue with Rocephin. Follow urine culture. -Mental status since improved this morning. Continue step down monitoring -I. will continue her on IV hydration.   Acute kidney injury Worsening creatinine noted this morning. Likely prerenal in the setting of dehydration and UTI. Continue IV hydration and monitor.  Nausea and vomiting Since to have improved I will start her on clears Continue antiemetics  Diastolic dysfunction Or any volume depleted and has received IV normal saline boluses on admission. Fluids were held as patient was short of breath yesterday. Currently stable. I will place  her on IV hydration  Depression Resume home medications.  Hypothyroidism Continue home medications  DVT prophylaxis  Code Status: DO NOT RESUSCITATE Family Communication: None at bedside  Disposition Plan: return to SNF once stable   Consultants:  ID  Procedures:  None  Antibiotics:  IV vancomycin, ampicillin, ceftriaxone, IV acyclovir (3/11-3/12)  IV Flagyl (3/11--)  HPI/Subjective: Patient appears to be more alert and oriented today although sleepy. Noted to have persistent diarrhea and flexible sigmoidoscopy ordered overnight. Vitals stable.  Objective: Filed Vitals:   12/05/12 0500 12/05/12 0600 12/05/12 0700 12/05/12 0800  BP: 124/60 103/50 147/58 167/67  Pulse: 104 100 91 91  Temp:      TempSrc:      Resp: 19 17 18 16   Height:      Weight:      SpO2: 91% 96% 96% 97%    Intake/Output Summary (Last 24 hours) at 12/05/12 0851 Last data filed at 12/05/12 0800  Gross per 24 hour  Intake   1288 ml  Output    465 ml  Net    823 ml   Filed Weights   12/04/12 1500  Weight: 89.2 kg (196 lb 10.4 oz)    Exam:   General:  Elderly female lying in bed sleepy.  Cardiovascular: Normal S1 and S2 no murmurs rub or gallop  Respiratory: Clear to auscultation bilaterally, no added sounds Abdomen: Rough, nondistended, nontender, bowel sounds present, Flexiseal   with liquid stool.  Extremities Warm, no edema, sweaty  CNS: Sleepy but awake to commands and oriented to place and person.  Data Reviewed: Basic Metabolic Panel:  Recent Labs Lab 12/04/12 0640 12/04/12 1843 12/05/12 0530  NA 138  --  140  K 3.9  --  4.4  CL 100  --  106  CO2 21  --  21  GLUCOSE 140*  --  158*  BUN 25*  --  33*  CREATININE 1.36*  --  1.98*  CALCIUM 9.2  --  7.9*  MG  --  1.8  --   PHOS  --  4.8*  --    Liver Function Tests:  Recent Labs Lab 12/04/12 0640  AST 19  ALT 18  ALKPHOS 81  BILITOT 0.3  PROT 7.7  ALBUMIN 3.4*    Recent Labs Lab 12/04/12 0640   LIPASE 35   No results found for this basename: AMMONIA,  in the last 168 hours CBC:  Recent Labs Lab 12/04/12 0640 12/05/12 0530  WBC 8.1 9.9  HGB 13.9 11.9*  HCT 42.7 37.8  MCV 90.5 91.7  PLT 262 264   Cardiac Enzymes: No results found for this basename: CKTOTAL, CKMB, CKMBINDEX, TROPONINI,  in the last 168 hours BNP (last 3 results)  Recent Labs  09/03/12 0644 10/08/12 1723 10/12/12 0530  PROBNP 936.0* 1356.0* 8213.0*   CBG: No results found for this basename: GLUCAP,  in the last 168 hours  Recent Results (from the past 240 hour(s))  URINE CULTURE     Status: None   Collection Time    12/04/12  6:19 AM      Result Value Range Status   Specimen Description URINE, CLEAN CATCH   Final   Special Requests NONE   Final   Culture  Setup Time 12/04/2012 16:30   Final   Colony Count >=100,000 COLONIES/ML   Final   Culture ESCHERICHIA COLI   Final   Report Status PENDING   Incomplete  GRAM STAIN     Status: None   Collection Time    12/04/12  3:17 PM      Result Value Range Status   Specimen Description CSF   Final   Special Requests NONE   Final   Gram Stain     Final   Value: CYTOSPIN     NO WBC SEEN     NO ORGANISMS SEEN   Report Status 12/04/2012 FINAL   Final  MRSA PCR SCREENING     Status: None   Collection Time    12/04/12  8:03 PM      Result Value Range Status   MRSA by PCR NEGATIVE  NEGATIVE Final   Comment:            The GeneXpert MRSA Assay (FDA     approved for NASAL specimens     only), is one component of a     comprehensive MRSA colonization     surveillance program. It is not     intended to diagnose MRSA     infection nor to guide or     monitor treatment for     MRSA infections.     Studies: Dg Chest Port 1 View  12/04/2012  *RADIOLOGY REPORT*  Clinical Data: Shortness of breath.  Congestive heart failure.  PORTABLE CHEST - 1 VIEW  Comparison: 10/08/2012  Findings: Cardiomegaly remains stable.  Mild pleural thickening in the left  costophrenic sulcus is unchanged.  No evidence of acute infiltrate or edema.  IMPRESSION: Stable cardiomegaly.  No acute findings.   Original Report Authenticated By: Myles Rosenthal, M.D.     Scheduled Meds: . acyclovir  900 mg Intravenous Q12H  . ampicillin (OMNIPEN) IV  2 g Intravenous Q6H  . cefTRIAXone (ROCEPHIN)  IV  2 g Intravenous Q12H  . dipyridamole-aspirin  1 capsule Oral BID  . donepezil  10 mg Oral QHS  . hydrocortisone sod succinate (SOLU-CORTEF) injection  50 mg Intravenous Q6H  . levothyroxine  150 mcg Oral QAC breakfast  . metronidazole  500 mg Intravenous Q8H  . saccharomyces boulardii  250 mg Oral BID  . sertraline  50 mg Oral Daily  . simvastatin  20 mg Oral QHS  . vancomycin  1,250 mg Intravenous Q24H  . vancomycin  125 mg Oral Q6H   Continuous Infusions: . sodium chloride    . dextrose 5 % and 0.45 % NaCl with KCl 20 mEq/L 50 mL/hr at 12/04/12 2206      Time spent: 35 minutes    DHUNGEL, NISHANT  [facility] Pager [phone] If 7PM-7AM, please contact night-coverage at [email or web address], password [facility] 12/05/2012, 8:51 AM  LOS: 1 day

## 2012-12-06 DIAGNOSIS — A0472 Enterocolitis due to Clostridium difficile, not specified as recurrent: Secondary | ICD-10-CM | POA: Diagnosis present

## 2012-12-06 DIAGNOSIS — A498 Other bacterial infections of unspecified site: Secondary | ICD-10-CM

## 2012-12-06 LAB — BASIC METABOLIC PANEL
BUN: 22 mg/dL (ref 6–23)
Chloride: 105 mEq/L (ref 96–112)
GFR calc Af Amer: 54 mL/min — ABNORMAL LOW (ref >90)
Potassium: 3.3 mEq/L — ABNORMAL LOW (ref 3.5–5.1)

## 2012-12-06 LAB — URINE CULTURE: Colony Count: >=100000

## 2012-12-06 MED ORDER — HALOPERIDOL LACTATE 5 MG/ML IJ SOLN
5.0000 mg | Freq: Four times a day (QID) | INTRAMUSCULAR | Status: DC | PRN
  Administered 2012-12-06: 5 mg via INTRAVENOUS
  Filled 2012-12-06: qty 1

## 2012-12-06 MED ORDER — POTASSIUM CHLORIDE CRYS ER 20 MEQ PO TBCR
40.0000 meq | EXTENDED_RELEASE_TABLET | Freq: Once | ORAL | Status: AC
  Administered 2012-12-06: 40 meq via ORAL
  Filled 2012-12-06: qty 2

## 2012-12-06 NOTE — Progress Notes (Signed)
INFECTIOUS DISEASE PROGRESS NOTE  [name] is a 79 y.o. female with   Active Problems:   Generalized weakness   Altered mental status   Hypothyroidism   Depression   Temporal arteritis   Acute on chronic diastolic heart failure   UTI (urinary tract infection)   UTI (lower urinary tract infection)   Nausea vomiting and diarrhea   Dehydration   Diarrhea   Clostridium difficile diarrhea   E-coli UTI  Subjective: No distress, alert and oriented.   Abtx:  Anti-infectives   Start     Dose/Rate Route Frequency Ordered Stop   12/06/12 0300  cefTRIAXone (ROCEPHIN) 1 g in dextrose 5 % 50 mL IVPB     1 g 100 mL/hr over 30 Minutes Intravenous Every 24 hours 12/05/12 0911     12/05/12 1500  metroNIDAZOLE (FLAGYL) IVPB 500 mg  Status:  Discontinued     500 mg 100 mL/hr over 60 Minutes Intravenous 3 times per day 12/05/12 1434 12/06/12 0717   12/05/12 0600  vancomycin (VANCOCIN) 50 mg/mL oral solution 125 mg     125 mg Oral 4 times per day 12/04/12 2246     12/04/12 1700  vancomycin (VANCOCIN) 50 mg/mL oral solution 125 mg  Status:  Discontinued     125 mg Oral 4 times per day 12/04/12 1610 12/04/12 2246   12/04/12 1615  metroNIDAZOLE (FLAGYL) IVPB 500 mg  Status:  Discontinued     500 mg 100 mL/hr over 60 Minutes Intravenous Every 8 hours 12/04/12 1610 12/05/12 0904   12/04/12 1600  cefTRIAXone (ROCEPHIN) 2 g in dextrose 5 % 50 mL IVPB  Status:  Discontinued     2 g 100 mL/hr over 30 Minutes Intravenous Every 12 hours 12/04/12 1528 12/05/12 0911   12/04/12 1600  vancomycin (VANCOCIN) 1,250 mg in sodium chloride 0.9 % 250 mL IVPB  Status:  Discontinued     1,250 mg 166.7 mL/hr over 90 Minutes Intravenous Every 24 hours 12/04/12 1528 12/05/12 0911   12/04/12 1600  acyclovir (ZOVIRAX) 900 mg in dextrose 5 % 150 mL IVPB  Status:  Discontinued     900 mg 168 mL/hr over 60 Minutes Intravenous Every 12 hours 12/04/12 1528 12/05/12 0911   12/04/12 1600  ampicillin (OMNIPEN) 2  g in sodium chloride 0.9 % 50 mL IVPB  Status:  Discontinued     2 g 150 mL/hr over 20 Minutes Intravenous 6 times per day 12/04/12 1528 12/04/12 1540   12/04/12 1600  ampicillin (OMNIPEN) 2 g in sodium chloride 0.9 % 50 mL IVPB  Status:  Discontinued     2 g 150 mL/hr over 20 Minutes Intravenous Every 6 hours 12/04/12 1540 12/05/12 0911   12/04/12 0830  cefTRIAXone (ROCEPHIN) 1 g in dextrose 5 % 50 mL IVPB     1 g 100 mL/hr over 30 Minutes Intravenous  Once 12/04/12 0823 12/04/12 1020      Medications:  Scheduled: . amLODipine  5 mg Oral Daily  . cefTRIAXone (ROCEPHIN)  IV  1 g Intravenous Q24H  . dipyridamole-aspirin  1 capsule Oral BID  . donepezil  10 mg Oral QHS  . hydrocortisone sod succinate (SOLU-CORTEF) injection  50 mg Intravenous Q6H  . levothyroxine  150 mcg Oral QAC breakfast  . saccharomyces boulardii  250 mg Oral BID  . sertraline  50 mg Oral Daily  . simvastatin  20 mg Oral QHS  . vancomycin  125 mg Oral Q6H  Objective: Vital signs in last 24 hours: Temp:  [97.7 F (36.5 C)-98.6 F (37 C)] 97.7 F (36.5 C) (03/13 0839) Pulse Rate:  [63-94] 84 (03/13 0812) Resp:  [15-25] 16 (03/13 0812) BP: (111-181)/(43-101) 150/58 mmHg (03/13 0940) SpO2:  [97 %-100 %] 98 % (03/13 0812) Weight:  [204 lb 5.9 oz (92.7 kg)] 204 lb 5.9 oz (92.7 kg) (03/13 0500)   General appearance: alert, cooperative and no distress Resp: clear to auscultation bilaterally Cardio: regular rate and rhythm GI: normal findings: bowel sounds normal and soft, non-tender Extremities: echmosis and skin tears.  A and O x3  Lab Results  Recent Labs  12/04/12 0640 12/05/12 0530 12/06/12 0445  WBC 8.1 9.9  --   HGB 13.9 11.9*  --   HCT 42.7 37.8  --   NA 138 140 136  K 3.9 4.4 3.3*  CL 100 106 105  CO2 21 21 21   BUN 25* 33* 22  CREATININE 1.36* 1.98* 1.09   Liver Panel  Recent Labs  12/04/12 0640  PROT 7.7  ALBUMIN 3.4*  AST 19  ALT 18  ALKPHOS 81  BILITOT 0.3    Sedimentation Rate No results found for this basename: ESRSEDRATE,  in the last 72 hours C-Reactive Protein No results found for this basename: CRP,  in the last 72 hours  Microbiology: Recent Results (from the past 240 hour(s))  URINE CULTURE     Status: None   Collection Time    12/04/12  6:19 AM      Result Value Range Status   Specimen Description URINE, CLEAN CATCH   Final   Special Requests NONE   Final   Culture  Setup Time 12/04/2012 16:30   Final   Colony Count >=100,000 COLONIES/ML   Final   Culture ESCHERICHIA COLI   Final   Report Status 12/06/2012 FINAL   Final   Organism ID, Bacteria ESCHERICHIA COLI   Final  CSF CULTURE     Status: None   Collection Time    12/04/12  3:17 PM      Result Value Range Status   Specimen Description CSF   Final   Special Requests NONE   Final   Gram Stain     Final   Value: WBC PRESENT, PREDOMINANTLY MONONUCLEAR     NO ORGANISMS SEEN     CYTOSPUN   Culture NO GROWTH 1 DAY   Final   Report Status PENDING   Incomplete  GRAM STAIN     Status: None   Collection Time    12/04/12  3:17 PM      Result Value Range Status   Specimen Description CSF   Final   Special Requests NONE   Final   Gram Stain     Final   Value: CYTOSPIN     NO WBC SEEN     NO ORGANISMS SEEN   Report Status 12/04/2012 FINAL   Final  CULTURE, BLOOD (ROUTINE X 2)     Status: None   Collection Time    12/04/12  6:35 PM      Result Value Range Status   Specimen Description BLOOD RIGHT HAND   Final   Special Requests BOTTLES DRAWN AEROBIC ONLY 2CC   Final   Culture  Setup Time 12/05/2012 00:54   Final   Culture     Final   Value:        BLOOD CULTURE RECEIVED NO GROWTH TO DATE CULTURE WILL BE HELD FOR 5  DAYS BEFORE ISSUING A FINAL NEGATIVE REPORT   Report Status PENDING   Incomplete  CULTURE, BLOOD (ROUTINE X 2)     Status: None   Collection Time    12/04/12  6:40 PM      Result Value Range Status   Specimen Description BLOOD RIGHT ARM   Final   Special  Requests BOTTLES DRAWN AEROBIC ONLY 6C   Final   Culture  Setup Time 12/05/2012 00:53   Final   Culture     Final   Value:        BLOOD CULTURE RECEIVED NO GROWTH TO DATE CULTURE WILL BE HELD FOR 5 DAYS BEFORE ISSUING A FINAL NEGATIVE REPORT   Report Status PENDING   Incomplete  MRSA PCR SCREENING     Status: None   Collection Time    12/04/12  8:03 PM      Result Value Range Status   MRSA by PCR NEGATIVE  NEGATIVE Final   Comment:            The GeneXpert MRSA Assay (FDA     approved for NASAL specimens     only), is one component of a     comprehensive MRSA colonization     surveillance program. It is not     intended to diagnose MRSA     infection nor to guide or     monitor treatment for     MRSA infections.  CLOSTRIDIUM DIFFICILE BY PCR     Status: Abnormal   Collection Time    12/04/12 10:53 PM      Result Value Range Status   C difficile by pcr POSITIVE (*) NEGATIVE Final   Comment: CRITICAL RESULT CALLED TO, READ BACK BY AND VERIFIED WITH:     G [facility] 12/05/12 0905 BY K SCHULTZ  STOOL CULTURE     Status: None   Collection Time    12/04/12 10:54 PM      Result Value Range Status   Specimen Description STOOL   Final   Special Requests Normal   Final   Culture Culture reincubated for better growth   Final   Report Status PENDING   Incomplete    Studies/Results: Dg Chest Port 1 View  12/04/2012  *RADIOLOGY REPORT*  Clinical Data: Shortness of breath.  Congestive heart failure.  PORTABLE CHEST - 1 VIEW  Comparison: 10/08/2012  Findings: Cardiomegaly remains stable.  Mild pleural thickening in the left costophrenic sulcus is unchanged.  No evidence of acute infiltrate or edema.  IMPRESSION: Stable cardiomegaly.  No acute findings.   Original Report Authenticated By: Myles Rosenthal, M.D.      Assessment/Plan: Sepsis  C diff  ? UTI: urine culture greater than 100,000 colonies of Escherichia coli  Acute Renal failure   Total days of antibiotics 3 (oral vancomycin/IV  flagyl, ceftriaxone)  sensitivities from her urine cx show E coli sensitive to ceftriaxone.  Plan for short course of antibiotics for her urine, 5 days.  Renal fxn improving She is better overall but still having significant stool output.  To regular floor today.   [name] Infectious Diseases [phone] 12/06/2012, 11:57 AM   LOS: 2 days

## 2012-12-06 NOTE — Progress Notes (Signed)
At 1538 patient had 3.65 sec pause in HR. Patient complained of feeling dizzy, but no chest pain. HR slowly recovered back to NSR in the 70's, patient no longer feeling dizzy and is presently asymptomatic. MD notified, new orders received.

## 2012-12-06 NOTE — Progress Notes (Signed)
[facility] PROGRESS NOTE  [name] MVH:[id number] DOB: May 14, 1930 DOA: 12/04/2012 PCP: Cain Saupe, MD  Brief narrative  79 year old female with a history off hypertension, hypothyroidism, dementia, diastolic CHF recently treated bacterial meningitis was sent from skilled nursing facility with nausea and vomiting and profuse diarrhea secondary to suspected viral GI outbreak admitted with fever and altered mental status.   Assessment/Plan:  Altered mental status  Likely in the setting of dehydration and sepsis secondary to c diff diarrhea. Patient presented with low blood pressure, fever and elevated lactate which has now resolved. An LP was done in the ED which is unremarkable. Gram stain is negative. Cultures pending. Patient was empirically started on IV vancomycin, ceftriaxone, ampicillin and acyclovir. Appreciate ID recommendations.  LP done on admission is unremarkable for bacterial meningitis. dced empiric coverage. Stool for C diff present . Started on oral vancomycin on 3/12. Still has diarrhea but clinically improved.  UTI  urine cx growing e coli sensitive to rocephin. Continue for 5 day course. ( day 2). Resistant to quinolone. Will d/c on IM ceftriaxone if ready in 1-2 days  Acute kidney injury  Likely prerenal in the setting of dehydration and UTI.  Improved with IV fluids in am labs  Nausea and vomiting  Resolved. On regular diet  Hypokalemia  replenish   Diastolic dysfunction  Currently stable  Depression  Resume home medications.   Hypothyroidism  Continue home medications   DVT prophylaxis   Code Status: DO NOT RESUSCITATE  Family Communication: None at bedside  Disposition Plan: return to SNF once stable ( as early as tomorrow if stable)  Consultants:  ID Procedures:  LP Antibiotics:  IV vancomycin, ampicillin, ceftriaxone, IV acyclovir (3/11-3/12) Oral vancomycin ( 3/12--) IV rocephin ( 3/11--)  HPI/Subjective: Patient fully awake  and oriented today. Has good appetite. Still having diarrhea. C/o unable to sleep  Objective: Filed Vitals:   12/06/12 0758 12/06/12 0812 12/06/12 0839 12/06/12 0940  BP:  124/57  150/58  Pulse: 67 84    Temp:   97.7 F (36.5 C)   TempSrc:   Oral   Resp: 17 16    Height:      Weight:      SpO2: 100% 98%      Intake/Output Summary (Last 24 hours) at 12/06/12 1008 Last data filed at 12/06/12 0930  Gross per 24 hour  Intake   1680 ml  Output   3125 ml  Net  -1445 ml   Filed Weights   12/04/12 1500 12/06/12 0500  Weight: 89.2 kg (196 lb 10.4 oz) 92.7 kg (204 lb 5.9 oz)    Exam:  General: Elderly female lying in bed in NAD Cardiovascular: Normal S1 and S2 no murmurs rub or gallop  Respiratory: Clear to auscultation bilaterally, no added sounds Abdomen: Rough, nondistended, nontender, bowel sounds present, Flexiseal with liquid stool.  Extremities Warm, no edema, sweaty  CNS: AAOX 3    Data Reviewed: Basic Metabolic Panel:  Recent Labs Lab 12/04/12 0640 12/04/12 1843 12/05/12 0530 12/06/12 0445  NA 138  --  140 136  K 3.9  --  4.4 3.3*  CL 100  --  106 105  CO2 21  --  21 21  GLUCOSE 140*  --  158* 93  BUN 25*  --  33* 22  CREATININE 1.36*  --  1.98* 1.09  CALCIUM 9.2  --  7.9* 8.0*  MG  --  1.8  --   --   PHOS  --  4.8*  --   --    Liver Function Tests:  Recent Labs Lab 12/04/12 0640  AST 19  ALT 18  ALKPHOS 81  BILITOT 0.3  PROT 7.7  ALBUMIN 3.4*    Recent Labs Lab 12/04/12 0640  LIPASE 35   No results found for this basename: AMMONIA,  in the last 168 hours CBC:  Recent Labs Lab 12/04/12 0640 12/05/12 0530  WBC 8.1 9.9  HGB 13.9 11.9*  HCT 42.7 37.8  MCV 90.5 91.7  PLT 262 264   Cardiac Enzymes: No results found for this basename: CKTOTAL, CKMB, CKMBINDEX, TROPONINI,  in the last 168 hours BNP (last 3 results)  Recent Labs  09/03/12 0644 10/08/12 1723 10/12/12 0530  PROBNP 936.0* 1356.0* 8213.0*   CBG: No results found  for this basename: GLUCAP,  in the last 168 hours  Recent Results (from the past 240 hour(s))  URINE CULTURE     Status: None   Collection Time    12/04/12  6:19 AM      Result Value Range Status   Specimen Description URINE, CLEAN CATCH   Final   Special Requests NONE   Final   Culture  Setup Time 12/04/2012 16:30   Final   Colony Count >=100,000 COLONIES/ML   Final   Culture ESCHERICHIA COLI   Final   Report Status 12/06/2012 FINAL   Final   Organism ID, Bacteria ESCHERICHIA COLI   Final  CSF CULTURE     Status: None   Collection Time    12/04/12  3:17 PM      Result Value Range Status   Specimen Description CSF   Final   Special Requests NONE   Final   Gram Stain     Final   Value: WBC PRESENT, PREDOMINANTLY MONONUCLEAR     NO ORGANISMS SEEN     CYTOSPUN   Culture PENDING   Incomplete   Report Status PENDING   Incomplete  GRAM STAIN     Status: None   Collection Time    12/04/12  3:17 PM      Result Value Range Status   Specimen Description CSF   Final   Special Requests NONE   Final   Gram Stain     Final   Value: CYTOSPIN     NO WBC SEEN     NO ORGANISMS SEEN   Report Status 12/04/2012 FINAL   Final  CULTURE, BLOOD (ROUTINE X 2)     Status: None   Collection Time    12/04/12  6:35 PM      Result Value Range Status   Specimen Description BLOOD RIGHT HAND   Final   Special Requests BOTTLES DRAWN AEROBIC ONLY 2CC   Final   Culture  Setup Time 12/05/2012 00:54   Final   Culture     Final   Value:        BLOOD CULTURE RECEIVED NO GROWTH TO DATE CULTURE WILL BE HELD FOR 5 DAYS BEFORE ISSUING A FINAL NEGATIVE REPORT   Report Status PENDING   Incomplete  CULTURE, BLOOD (ROUTINE X 2)     Status: None   Collection Time    12/04/12  6:40 PM      Result Value Range Status   Specimen Description BLOOD RIGHT ARM   Final   Special Requests BOTTLES DRAWN AEROBIC ONLY 6C   Final   Culture  Setup Time 12/05/2012 00:53   Final   Culture  Final   Value:        BLOOD CULTURE  RECEIVED NO GROWTH TO DATE CULTURE WILL BE HELD FOR 5 DAYS BEFORE ISSUING A FINAL NEGATIVE REPORT   Report Status PENDING   Incomplete  MRSA PCR SCREENING     Status: None   Collection Time    12/04/12  8:03 PM      Result Value Range Status   MRSA by PCR NEGATIVE  NEGATIVE Final   Comment:            The GeneXpert MRSA Assay (FDA     approved for NASAL specimens     only), is one component of a     comprehensive MRSA colonization     surveillance program. It is not     intended to diagnose MRSA     infection nor to guide or     monitor treatment for     MRSA infections.  CLOSTRIDIUM DIFFICILE BY PCR     Status: Abnormal   Collection Time    12/04/12 10:53 PM      Result Value Range Status   C difficile by pcr POSITIVE (*) NEGATIVE Final   Comment: CRITICAL RESULT CALLED TO, READ BACK BY AND VERIFIED WITH:     G [facility] 12/05/12 0905 BY K SCHULTZ  STOOL CULTURE     Status: None   Collection Time    12/04/12 10:54 PM      Result Value Range Status   Specimen Description STOOL   Final   Special Requests Normal   Final   Culture Culture reincubated for better growth   Final   Report Status PENDING   Incomplete     Studies: Dg Chest Port 1 View  12/04/2012  *RADIOLOGY REPORT*  Clinical Data: Shortness of breath.  Congestive heart failure.  PORTABLE CHEST - 1 VIEW  Comparison: 10/08/2012  Findings: Cardiomegaly remains stable.  Mild pleural thickening in the left costophrenic sulcus is unchanged.  No evidence of acute infiltrate or edema.  IMPRESSION: Stable cardiomegaly.  No acute findings.   Original Report Authenticated By: Myles Rosenthal, M.D.     Scheduled Meds: . amLODipine  5 mg Oral Daily  . cefTRIAXone (ROCEPHIN)  IV  1 g Intravenous Q24H  . dipyridamole-aspirin  1 capsule Oral BID  . donepezil  10 mg Oral QHS  . hydrocortisone sod succinate (SOLU-CORTEF) injection  50 mg Intravenous Q6H  . levothyroxine  150 mcg Oral QAC breakfast  . saccharomyces boulardii  250 mg  Oral BID  . sertraline  50 mg Oral Daily  . simvastatin  20 mg Oral QHS  . vancomycin  125 mg Oral Q6H   Continuous Infusions:     Time spent: 25 minutes    DHUNGEL, NISHANT  [facility] Pager [phone]. If 7PM-7AM, please contact night-coverage at [email or web address], password [facility] 12/06/2012, 10:08 AM  LOS: 2 days

## 2012-12-06 NOTE — ED Provider Notes (Signed)
Medical screening examination/treatment/procedure(s) were performed by non-physician practitioner and as supervising physician I was immediately available for consultation/collaboration.  Doug Sou, MD 12/06/12 1737

## 2012-12-07 DIAGNOSIS — I498 Other specified cardiac arrhythmias: Secondary | ICD-10-CM

## 2012-12-07 DIAGNOSIS — R001 Bradycardia, unspecified: Secondary | ICD-10-CM | POA: Diagnosis not present

## 2012-12-07 MED ORDER — HYDRALAZINE HCL 10 MG PO TABS
10.0000 mg | ORAL_TABLET | Freq: Three times a day (TID) | ORAL | Status: DC
  Administered 2012-12-07 – 2012-12-09 (×5): 10 mg via ORAL
  Filled 2012-12-07 (×9): qty 1

## 2012-12-07 MED ORDER — MAGNESIUM SULFATE 40 MG/ML IJ SOLN
2.0000 g | Freq: Once | INTRAMUSCULAR | Status: AC
  Administered 2012-12-07: 2 g via INTRAVENOUS
  Filled 2012-12-07: qty 50

## 2012-12-07 MED ORDER — DEXTROSE 5 % IV SOLN
1.0000 g | INTRAVENOUS | Status: AC
  Administered 2012-12-08: 1 g via INTRAVENOUS
  Filled 2012-12-07: qty 10

## 2012-12-07 NOTE — Evaluation (Signed)
Physical Therapy Evaluation Patient Details Name: [name] MRN: [id number] DOB: 10/02/1929 Today's Date: 12/07/2012 Time: 7829-5621 PT Time Calculation (min): 21 min  PT Assessment / Plan / Recommendation Clinical Impression  79 y.o. female admitted from ALF with nausea, vomiting, diarrhea; pt has h/o recent Bacterial meningitis 6 wks ago. Pt continuing to have large, loose BMs. She ambulated 6' with RW with assist of 2. Expect she can return to ALF. She would benefit from acute PT to maximize safety and independence with mobility.     PT Assessment  Patient needs continued PT services    Follow Up Recommendations  Home health PT;Supervision/Assistance - 24 hour    Does the patient have the potential to tolerate intense rehabilitation      Barriers to Discharge        Equipment Recommendations  None recommended by PT    Recommendations for Other Services     Frequency Min 3X/week    Precautions / Restrictions Precautions Precaution Comments: incontinent of bowel   Pertinent Vitals/Pain *pt denies pain**      Mobility  Bed Mobility Bed Mobility: Supine to Sit Supine to Sit: 3: Mod assist Transfers Transfers: Sit to Stand;Stand to Sit Sit to Stand: 1: +2 Total assist;From bed Sit to Stand: Patient Percentage: 60% Stand to Sit: 1: +2 Total assist;To chair/3-in-1 Stand to Sit: Patient Percentage: 70% Details for Transfer Assistance: assist to rise and to steady Ambulation/Gait Ambulation/Gait Assistance: 1: +2 Total assist Ambulation/Gait: Patient Percentage: 80% Ambulation Distance (Feet): 6 Feet Assistive device: Rolling walker Gait Pattern: Step-to pattern General Gait Details: bed to recliner, no LOB    Exercises     PT Diagnosis: Difficulty walking;Generalized weakness  PT Problem List: Decreased activity tolerance;Decreased mobility PT Treatment Interventions: Gait training;Functional mobility training;Therapeutic activities;Therapeutic  exercise;Patient/family education   PT Goals Acute Rehab PT Goals PT Goal Formulation: With patient Time For Goal Achievement: 12/21/12 Potential to Achieve Goals: Good Pt will go Supine/Side to Sit: with min assist PT Goal: Supine/Side to Sit - Progress: Goal set today Pt will go Sit to Stand: with min assist PT Goal: Sit to Stand - Progress: Goal set today Pt will Ambulate: 16 - 50 feet;with min assist;with rolling walker PT Goal: Ambulate - Progress: Goal set today Pt will Perform Home Exercise Program: Independently PT Goal: Perform Home Exercise Program - Progress: Goal set today  Visit Information  Last PT Received On: 12/07/12 Assistance Needed: +2    Subjective Data  Subjective: Oh, this feels good. (sitting up in a chair) Patient Stated Goal: be able to walk   Prior Functioning  Home Living Type of Home: Assisted living Home Adaptive Equipment: Walker - rolling;Wheelchair - manual Additional Comments: used RW for short distances, WC for longer distances Prior Function Level of Independence: Needs assistance Needs Assistance: Bathing;Dressing;Gait Gait Assistance: assist of 1 with RW for short distances  Communication Communication: No difficulties    Cognition  Cognition Overall Cognitive Status: Appears within functional limits for tasks assessed/performed Arousal/Alertness: Awake/alert Orientation Level: Appears intact for tasks assessed Behavior During Session: [facility] for tasks performed    Extremity/Trunk Assessment Right Upper Extremity Assessment RUE ROM/Strength/Tone: Within functional levels Left Upper Extremity Assessment LUE ROM/Strength/Tone: Within functional levels Right Lower Extremity Assessment RLE ROM/Strength/Tone: Within functional levels RLE Sensation: WFL - Light Touch RLE Coordination: WFL - gross/fine motor Left Lower Extremity Assessment LLE ROM/Strength/Tone: Within functional levels LLE Sensation: WFL - Light Touch LLE  Coordination: WFL - gross/fine motor Trunk Assessment Trunk  Assessment: Normal   Balance    End of Session PT - End of Session Equipment Utilized During Treatment: Gait belt;Oxygen Activity Tolerance: Patient tolerated treatment well Patient left: in chair;with call bell/phone within reach Nurse Communication: Mobility status  GP     Ralene Bathe Kistler 12/07/2012, 2:15 PM [phone]

## 2012-12-07 NOTE — Progress Notes (Addendum)
Pt has became extremely confused, restless, and agitated. Pt is pulling at PICC line and foley. Pt has managed to pull out flexiseal (inflated balloon intact). Stool is still liquid and now is running across buttox. Perineal Skin is irritated. Patient was changed and new linen placed on bed. Tech assisted with care. New flexiseal ordered from portable equipment for re-insertion as patient needs to keep stool off perineal areas per orders. Will attempt later during shift as patient is too disoriented and non - cooperative at this time.    hosptitalist called, new orders (haldol) given. Will continue to monitor patient.

## 2012-12-07 NOTE — Progress Notes (Signed)
[facility] PROGRESS NOTE  [name] ZOX:[id number] DOB: [birth year] DOA: 12/04/2012 PCP: Cain Saupe, MD  Brief narrative  79 year old female with a history off hypertension, hypothyroidism, dementia, diastolic CHF recently treated bacterial meningitis was sent from skilled nursing facility with nausea and vomiting and profuse diarrhea secondary to suspected viral GI outbreak admitted with fever and altered mental status.   Assessment/Plan:  Altered mental status  Likely in the setting of dehydration and sepsis secondary to c diff diarrhea. Patient presented with low blood pressure, fever and elevated lactate which has now resolved. An LP was done in the ED which is unremarkable. Gram stain is negative. Cultures pending. Patient was empirically started on IV vancomycin, ceftriaxone, ampicillin and acyclovir. Appreciate ID recommendations.  LP done on admission is unremarkable for bacterial meningitis. dced empiric coverage. Stool for C diff present . Started on oral vancomycin on 3/12. Still has diarrhea but clinically improved.   UTI  urine cx growing e coli sensitive to rocephin. Continue for 5 day course. ( day 3). Resistant to quinolone. Will d/c on IM ceftriaxone  Acute kidney injury  Likely prerenal in the setting of dehydration and UTI.  Improved with IV fluids   Bradycardia Noted to have heart rate in 40s and 50s on telemetry. Nurse also reported a 3.6 second pause on telemetry. Low potassium and magnesium and replenish. We'll check 12-lead EKG. I will discontinue amlodipine and switch to hydralazine.  hypertension Switch amlodipine to hydralazine    Nausea and vomiting  Resolved. On regular diet   Hypokalemia  replenish   Diastolic dysfunction  Currently stable   Depression  Resume home medications.   Hypothyroidism  Continue home medications   DVT prophylaxis   Code Status: DO NOT RESUSCITATE   Family Communication: None at bedside    Disposition Plan: return to ALF if stable overnight   Consultants:  ID Procedures:  LP Antibiotics:  IV vancomycin, ampicillin, ceftriaxone, IV acyclovir (3/11-3/12)  Oral vancomycin ( 3/12--)  IV rocephin ( 3/11--)     HPI/Subjective: And reportedly very confused and restless overnight. She was agitated and trying to pull the PICC line and Foley. She removed the flexiseal as well. Also noted to have a 3 second pause on telemetry.  Objective: Filed Vitals:   12/06/12 1538 12/06/12 1712 12/06/12 2243 12/07/12 0648  BP: 141/65 167/67 146/106 191/70  Pulse: 83 66 78 72  Temp:  98.1 F (36.7 C) 97.6 F (36.4 C) 97.6 F (36.4 C)  TempSrc:  Oral Oral Axillary  Resp: 18 18 18 18   Height:      Weight:      SpO2: 100% 100% 100% 100%    Intake/Output Summary (Last 24 hours) at 12/07/12 1405 Last data filed at 12/07/12 0649  Gross per 24 hour  Intake     40 ml  Output   1565 ml  Net  -1525 ml   Filed Weights   12/04/12 1500 12/06/12 0500  Weight: 89.2 kg (196 lb 10.4 oz) 92.7 kg (204 lb 5.9 oz)    Exam:  General: Elderly female lying in bed in NAD, appears sleepy Cardiovascular: Normal S1 and S2 no murmurs rub or gallop  Respiratory: Clear to auscultation bilaterally, no added sounds  Abdomen: Rough, nondistended, nontender, bowel sounds present,  Extremities Warm, no edema,  CNS: sleepy but easily arousable and oriented.   Data Reviewed: Basic Metabolic Panel:  Recent Labs Lab 12/04/12 0640 12/04/12 1843 12/05/12 0530 12/06/12 0445  NA 138  --  140 136  K 3.9  --  4.4 3.3*  CL 100  --  106 105  CO2 21  --  21 21  GLUCOSE 140*  --  158* 93  BUN 25*  --  33* 22  CREATININE 1.36*  --  1.98* 1.09  CALCIUM 9.2  --  7.9* 8.0*  MG  --  1.8  --  1.9  PHOS  --  4.8*  --   --    Liver Function Tests:  Recent Labs Lab 12/04/12 0640  AST 19  ALT 18  ALKPHOS 81  BILITOT 0.3  PROT 7.7  ALBUMIN 3.4*    Recent Labs Lab 12/04/12 0640  LIPASE 35    No results found for this basename: AMMONIA,  in the last 168 hours CBC:  Recent Labs Lab 12/04/12 0640 12/05/12 0530  WBC 8.1 9.9  HGB 13.9 11.9*  HCT 42.7 37.8  MCV 90.5 91.7  PLT 262 264   Cardiac Enzymes: No results found for this basename: CKTOTAL, CKMB, CKMBINDEX, TROPONINI,  in the last 168 hours BNP (last 3 results)  Recent Labs  09/03/12 0644 10/08/12 1723 10/12/12 0530  PROBNP 936.0* 1356.0* 8213.0*   CBG: No results found for this basename: GLUCAP,  in the last 168 hours  Recent Results (from the past 240 hour(s))  URINE CULTURE     Status: None   Collection Time    12/04/12  6:19 AM      Result Value Range Status   Specimen Description URINE, CLEAN CATCH   Final   Special Requests NONE   Final   Culture  Setup Time 12/04/2012 16:30   Final   Colony Count >=100,000 COLONIES/ML   Final   Culture ESCHERICHIA COLI   Final   Report Status 12/06/2012 FINAL   Final   Organism ID, Bacteria ESCHERICHIA COLI   Final  CSF CULTURE     Status: None   Collection Time    12/04/12  3:17 PM      Result Value Range Status   Specimen Description CSF   Final   Special Requests NONE   Final   Gram Stain     Final   Value: WBC PRESENT, PREDOMINANTLY MONONUCLEAR     NO ORGANISMS SEEN     CYTOSPUN   Culture NO GROWTH 2 DAYS   Final   Report Status PENDING   Incomplete  GRAM STAIN     Status: None   Collection Time    12/04/12  3:17 PM      Result Value Range Status   Specimen Description CSF   Final   Special Requests NONE   Final   Gram Stain     Final   Value: CYTOSPIN     NO WBC SEEN     NO ORGANISMS SEEN   Report Status 12/04/2012 FINAL   Final  CULTURE, BLOOD (ROUTINE X 2)     Status: None   Collection Time    12/04/12  6:35 PM      Result Value Range Status   Specimen Description BLOOD RIGHT HAND   Final   Special Requests BOTTLES DRAWN AEROBIC ONLY 2CC   Final   Culture  Setup Time 12/05/2012 00:54   Final   Culture     Final   Value:        BLOOD  CULTURE RECEIVED NO GROWTH TO DATE CULTURE WILL BE HELD FOR 5 DAYS BEFORE ISSUING A FINAL NEGATIVE REPORT  Report Status PENDING   Incomplete  CULTURE, BLOOD (ROUTINE X 2)     Status: None   Collection Time    12/04/12  6:40 PM      Result Value Range Status   Specimen Description BLOOD RIGHT ARM   Final   Special Requests BOTTLES DRAWN AEROBIC ONLY 6C   Final   Culture  Setup Time 12/05/2012 00:53   Final   Culture     Final   Value:        BLOOD CULTURE RECEIVED NO GROWTH TO DATE CULTURE WILL BE HELD FOR 5 DAYS BEFORE ISSUING A FINAL NEGATIVE REPORT   Report Status PENDING   Incomplete  MRSA PCR SCREENING     Status: None   Collection Time    12/04/12  8:03 PM      Result Value Range Status   MRSA by PCR NEGATIVE  NEGATIVE Final   Comment:            The GeneXpert MRSA Assay (FDA     approved for NASAL specimens     only), is one component of a     comprehensive MRSA colonization     surveillance program. It is not     intended to diagnose MRSA     infection nor to guide or     monitor treatment for     MRSA infections.  CLOSTRIDIUM DIFFICILE BY PCR     Status: Abnormal   Collection Time    12/04/12 10:53 PM      Result Value Range Status   C difficile by pcr POSITIVE (*) NEGATIVE Final   Comment: CRITICAL RESULT CALLED TO, READ BACK BY AND VERIFIED WITH:     G [facility] 12/05/12 0905 BY K SCHULTZ  STOOL CULTURE     Status: None   Collection Time    12/04/12 10:54 PM      Result Value Range Status   Specimen Description STOOL   Final   Special Requests Normal   Final   Culture NO SUSPICIOUS COLONIES, CONTINUING TO HOLD   Final   Report Status PENDING   Incomplete     Studies: No results found.  Scheduled Meds: . cefTRIAXone (ROCEPHIN)  IV  1 g Intravenous Q24H  . dipyridamole-aspirin  1 capsule Oral BID  . donepezil  10 mg Oral QHS  . hydrocortisone sod succinate (SOLU-CORTEF) injection  50 mg Intravenous Q6H  . levothyroxine  150 mcg Oral QAC breakfast  .  saccharomyces boulardii  250 mg Oral BID  . sertraline  50 mg Oral Daily  . simvastatin  20 mg Oral QHS  . vancomycin  125 mg Oral Q6H   Continuous Infusions:     Time spent: 45 minutes    DHUNGEL, NISHANT  [facility] Pager [phone] If 7PM-7AM, please contact night-coverage at [email or web address], password [facility] 12/07/2012, 2:05 PM  LOS: 3 days

## 2012-12-07 NOTE — Progress Notes (Signed)
Clinical Social Work  Per chart review, PT recommends HH at ALF. CSW called [facility] and updated that patient might be ready to dc over the week. ALF agreeable to accept patient back with [facility] therapy. CSW placed FL2 in chart for MD signature. CSW will continue to follow.  [place], [place] [phone]

## 2012-12-07 NOTE — Progress Notes (Signed)
INFECTIOUS DISEASE PROGRESS NOTE  ID: [name] is a 79 y.o. female with   Active Problems:   Generalized weakness   Altered mental status   Hypothyroidism   Depression   Temporal arteritis   Acute on chronic diastolic heart failure   UTI (urinary tract infection)   UTI (lower urinary tract infection)   Nausea vomiting and diarrhea   Dehydration   Diarrhea   Clostridium difficile diarrhea   E-coli UTI   Bradycardia  Subjective: pleasant  Abtx:  Anti-infectives   Start     Dose/Rate Route Frequency Ordered Stop   12/06/12 0300  cefTRIAXone (ROCEPHIN) 1 g in dextrose 5 % 50 mL IVPB     1 g 100 mL/hr over 30 Minutes Intravenous Every 24 hours 12/05/12 0911     12/05/12 1500  metroNIDAZOLE (FLAGYL) IVPB 500 mg  Status:  Discontinued     500 mg 100 mL/hr over 60 Minutes Intravenous 3 times per day 12/05/12 1434 12/06/12 0717   12/05/12 0600  vancomycin (VANCOCIN) 50 mg/mL oral solution 125 mg     125 mg Oral 4 times per day 12/04/12 2246     12/04/12 1700  vancomycin (VANCOCIN) 50 mg/mL oral solution 125 mg  Status:  Discontinued     125 mg Oral 4 times per day 12/04/12 1610 12/04/12 2246   12/04/12 1615  metroNIDAZOLE (FLAGYL) IVPB 500 mg  Status:  Discontinued     500 mg 100 mL/hr over 60 Minutes Intravenous Every 8 hours 12/04/12 1610 12/05/12 0904   12/04/12 1600  cefTRIAXone (ROCEPHIN) 2 g in dextrose 5 % 50 mL IVPB  Status:  Discontinued     2 g 100 mL/hr over 30 Minutes Intravenous Every 12 hours 12/04/12 1528 12/05/12 0911   12/04/12 1600  vancomycin (VANCOCIN) 1,250 mg in sodium chloride 0.9 % 250 mL IVPB  Status:  Discontinued     1,250 mg 166.7 mL/hr over 90 Minutes Intravenous Every 24 hours 12/04/12 1528 12/05/12 0911   12/04/12 1600  acyclovir (ZOVIRAX) 900 mg in dextrose 5 % 150 mL IVPB  Status:  Discontinued     900 mg 168 mL/hr over 60 Minutes Intravenous Every 12 hours 12/04/12 1528 12/05/12 0911   12/04/12 1600  ampicillin (OMNIPEN) 2 g in  sodium chloride 0.9 % 50 mL IVPB  Status:  Discontinued     2 g 150 mL/hr over 20 Minutes Intravenous 6 times per day 12/04/12 1528 12/04/12 1540   12/04/12 1600  ampicillin (OMNIPEN) 2 g in sodium chloride 0.9 % 50 mL IVPB  Status:  Discontinued     2 g 150 mL/hr over 20 Minutes Intravenous Every 6 hours 12/04/12 1540 12/05/12 0911   12/04/12 0830  cefTRIAXone (ROCEPHIN) 1 g in dextrose 5 % 50 mL IVPB     1 g 100 mL/hr over 30 Minutes Intravenous  Once 12/04/12 0823 12/04/12 1020      Medications:  Scheduled: . cefTRIAXone (ROCEPHIN)  IV  1 g Intravenous Q24H  . dipyridamole-aspirin  1 capsule Oral BID  . donepezil  10 mg Oral QHS  . hydrALAZINE  10 mg Oral Q8H  . hydrocortisone sod succinate (SOLU-CORTEF) injection  50 mg Intravenous Q6H  . levothyroxine  150 mcg Oral QAC breakfast  . magnesium sulfate 1 - 4 g bolus IVPB  2 g Intravenous Once  . saccharomyces boulardii  250 mg Oral BID  . sertraline  50 mg Oral Daily  . simvastatin  20 mg  Oral QHS  . vancomycin  125 mg Oral Q6H    Objective: Vital signs in last 24 hours: Temp:  [97.6 F (36.4 C)-98.1 F (36.7 C)] 97.6 F (36.4 C) (03/14 0648) Pulse Rate:  [66-83] 72 (03/14 0648) Resp:  [18] 18 (03/14 0648) BP: (141-191)/(65-106) 191/70 mmHg (03/14 0648) SpO2:  [100 %] 100 % (03/14 0648)   General appearance: alert, cooperative and no distress Resp: clear to auscultation bilaterally Cardio: regular rate and rhythm GI: normal findings: bowel sounds normal and soft, non-tender Extremities: RUE PIC in place Neurologic: Mental status: alertness: alert, orientation: person, place, date is May, when asked why she was pulling at her lines: "I had mittens on... I guess they itched."  Lab Results  Recent Labs  12/05/12 0530 12/06/12 0445  WBC 9.9  --   HGB 11.9*  --   HCT 37.8  --   NA 140 136  K 4.4 3.3*  CL 106 105  CO2 21 21  BUN 33* 22  CREATININE 1.98* 1.09   Liver Panel No results found for this basename:  PROT, ALBUMIN, AST, ALT, ALKPHOS, BILITOT, BILIDIR, IBILI,  in the last 72 hours Sedimentation Rate No results found for this basename: ESRSEDRATE,  in the last 72 hours C-Reactive Protein No results found for this basename: CRP,  in the last 72 hours  Microbiology: Recent Results (from the past 240 hour(s))  URINE CULTURE     Status: None   Collection Time    12/04/12  6:19 AM      Result Value Range Status   Specimen Description URINE, CLEAN CATCH   Final   Special Requests NONE   Final   Culture  Setup Time 12/04/2012 16:30   Final   Colony Count >=100,000 COLONIES/ML   Final   Culture ESCHERICHIA COLI   Final   Report Status 12/06/2012 FINAL   Final   Organism ID, Bacteria ESCHERICHIA COLI   Final  CSF CULTURE     Status: None   Collection Time    12/04/12  3:17 PM      Result Value Range Status   Specimen Description CSF   Final   Special Requests NONE   Final   Gram Stain     Final   Value: WBC PRESENT, PREDOMINANTLY MONONUCLEAR     NO ORGANISMS SEEN     CYTOSPUN   Culture NO GROWTH 2 DAYS   Final   Report Status PENDING   Incomplete  GRAM STAIN     Status: None   Collection Time    12/04/12  3:17 PM      Result Value Range Status   Specimen Description CSF   Final   Special Requests NONE   Final   Gram Stain     Final   Value: CYTOSPIN     NO WBC SEEN     NO ORGANISMS SEEN   Report Status 12/04/2012 FINAL   Final  CULTURE, BLOOD (ROUTINE X 2)     Status: None   Collection Time    12/04/12  6:35 PM      Result Value Range Status   Specimen Description BLOOD RIGHT HAND   Final   Special Requests BOTTLES DRAWN AEROBIC ONLY 2CC   Final   Culture  Setup Time 12/05/2012 00:54   Final   Culture     Final   Value:        BLOOD CULTURE RECEIVED NO GROWTH TO DATE CULTURE WILL BE HELD  FOR 5 DAYS BEFORE ISSUING A FINAL NEGATIVE REPORT   Report Status PENDING   Incomplete  CULTURE, BLOOD (ROUTINE X 2)     Status: None   Collection Time    12/04/12  6:40 PM      Result  Value Range Status   Specimen Description BLOOD RIGHT ARM   Final   Special Requests BOTTLES DRAWN AEROBIC ONLY 6C   Final   Culture  Setup Time 12/05/2012 00:53   Final   Culture     Final   Value:        BLOOD CULTURE RECEIVED NO GROWTH TO DATE CULTURE WILL BE HELD FOR 5 DAYS BEFORE ISSUING A FINAL NEGATIVE REPORT   Report Status PENDING   Incomplete  MRSA PCR SCREENING     Status: None   Collection Time    12/04/12  8:03 PM      Result Value Range Status   MRSA by PCR NEGATIVE  NEGATIVE Final   Comment:            The GeneXpert MRSA Assay (FDA     approved for NASAL specimens     only), is one component of a     comprehensive MRSA colonization     surveillance program. It is not     intended to diagnose MRSA     infection nor to guide or     monitor treatment for     MRSA infections.  CLOSTRIDIUM DIFFICILE BY PCR     Status: Abnormal   Collection Time    12/04/12 10:53 PM      Result Value Range Status   C difficile by pcr POSITIVE (*) NEGATIVE Final   Comment: CRITICAL RESULT CALLED TO, READ BACK BY AND VERIFIED WITH:     G [facility] 12/05/12 0905 BY K SCHULTZ  STOOL CULTURE     Status: None   Collection Time    12/04/12 10:54 PM      Result Value Range Status   Specimen Description STOOL   Final   Special Requests Normal   Final   Culture NO SUSPICIOUS COLONIES, CONTINUING TO HOLD   Final   Report Status PENDING   Incomplete    Studies/Results: No results found.   Assessment/Plan: Sepsis  C diff  ? UTI: urine culture greater than 100,000 colonies of Escherichia coli  Acute Renal failure- resolved Total days of antibiotics 4 (oral vancomycin/IV flagyl, ceftriaxone)   sensitivities from her urine cx show E coli sensitive to ceftriaxone.  Plan for short course of antibiotics for her urine, stop anbx after tomorrows dose.  Renal fxn back to baseline.   Dr Orvan Falconer available if questions over w/e.    [name] Infectious Diseases [phone] 12/07/2012,  3:33 PM   LOS: 3 days

## 2012-12-08 ENCOUNTER — Other Ambulatory Visit: Payer: Self-pay

## 2012-12-08 LAB — BASIC METABOLIC PANEL
BUN: 16 mg/dL (ref 6–23)
Chloride: 104 mEq/L (ref 96–112)
GFR calc Af Amer: 70 mL/min — ABNORMAL LOW (ref >90)
Potassium: 2.5 mEq/L — CL (ref 3.5–5.1)

## 2012-12-08 LAB — CSF CULTURE W GRAM STAIN

## 2012-12-08 LAB — MAGNESIUM: Magnesium: 2 mg/dL (ref 1.5–2.5)

## 2012-12-08 LAB — STOOL CULTURE

## 2012-12-08 MED ORDER — POTASSIUM CHLORIDE CRYS ER 20 MEQ PO TBCR
40.0000 meq | EXTENDED_RELEASE_TABLET | ORAL | Status: AC
  Administered 2012-12-08 (×2): 40 meq via ORAL
  Filled 2012-12-08 (×4): qty 2

## 2012-12-08 NOTE — Progress Notes (Signed)
[facility] PROGRESS NOTE  [name] ZOX:[id number] DOB: Nov 08, 1929 DOA: 12/04/2012 PCP: Cain Saupe, MD  Brief narrative  79 year old female with a history off hypertension, hypothyroidism, dementia, diastolic CHF recently treated bacterial meningitis was sent from skilled nursing facility with nausea and vomiting and profuse diarrhea secondary to suspected viral GI outbreak admitted with fever and altered mental status.   Assessment/Plan:  Altered mental status  Likely in the setting of dehydration and sepsis secondary to c diff diarrhea. Patient presented with low blood pressure, fever and elevated lactate which has now resolved. An LP was done in the ED which is unremarkable. Gram stain is negative. Cultures negative. Patient was empirically started on IV vancomycin, ceftriaxone, ampicillin and acyclovir. Appreciate ID recommendations.  LP done on admission is unremarkable for bacterial meningitis. dced empiric coverage. Stool for C diff present . Started on oral vancomycin on 3/12. Still has diarrhea but clinically improved.   UTI  urine cx growing e coli sensitive to rocephin. Prescribed a course today. Resistant to quinolone.   Acute kidney injury  Likely prerenal in the setting of dehydration and UTI.  Improved with IV fluids   Bradycardia  Noted to have heart rate in 40s and 50s on telemetry on 3/14. Nurse also reported a 3.6 second pause on telemetry. Low potassium and magnesium replenished. Has PVCs on monitor. Recheck electrolytes . have discontinued amlodipine and switch to hydralazine. 2-D echo done 2 months back unremarkable.  hypertension  Switch amlodipine to hydralazine   Nausea and vomiting  Resolved. On regular diet    Diastolic dysfunction  Currently stable   Depression  Continue home medications.   Hypothyroidism  Continue home medications . Check TSH  DVT prophylaxis   Code Status: DO NOT RESUSCITATE  Family Communication: None at  bedside  Disposition Plan: return to ALF tomorrow if stable overnight   Consultants:  ID Procedures:  LP Antibiotics:  IV vancomycin, ampicillin, ceftriaxone, IV acyclovir (3/11-3/12)  Oral vancomycin ( 3/12--)  IV rocephin ( 3/11--3/15)      HPI/Subjective: Patient seen and examined this morning. Informs feeling sleepy  Objective: Filed Vitals:   12/07/12 1453 12/07/12 2200 12/08/12 0045 12/08/12 0600  BP: 152/83 159/56 172/53 139/51  Pulse: 83 64 77 72  Temp: 97.5 F (36.4 C) 97.9 F (36.6 C) 97.3 F (36.3 C) 98 F (36.7 C)  TempSrc: Oral Oral  Oral  Resp: 18 18  18   Height:      Weight:      SpO2: 95% 98% 93% 97%    Intake/Output Summary (Last 24 hours) at 12/08/12 1206 Last data filed at 12/08/12 0931  Gross per 24 hour  Intake     50 ml  Output   1800 ml  Net  -1750 ml   Filed Weights   12/04/12 1500 12/06/12 0500  Weight: 89.2 kg (196 lb 10.4 oz) 92.7 kg (204 lb 5.9 oz)    Exam:  General: Elderly female lying in bed in NAD, Cardiovascular: Normal S1 and S2 no murmurs rub or gallop  Respiratory: Clear to auscultation bilaterally, no added sounds  Abdomen: Rough, nondistended, nontender, bowel sounds present,  Extremities Warm, no edema,  CNS: AAO x3  Data Reviewed: Basic Metabolic Panel:  Recent Labs Lab 12/04/12 0640 12/04/12 1843 12/05/12 0530 12/06/12 0445  NA 138  --  140 136  K 3.9  --  4.4 3.3*  CL 100  --  106 105  CO2 21  --  21 21  GLUCOSE  140*  --  158* 93  BUN 25*  --  33* 22  CREATININE 1.36*  --  1.98* 1.09  CALCIUM 9.2  --  7.9* 8.0*  MG  --  1.8  --  1.9  PHOS  --  4.8*  --   --    Liver Function Tests:  Recent Labs Lab 12/04/12 0640  AST 19  ALT 18  ALKPHOS 81  BILITOT 0.3  PROT 7.7  ALBUMIN 3.4*    Recent Labs Lab 12/04/12 0640  LIPASE 35   No results found for this basename: AMMONIA,  in the last 168 hours CBC:  Recent Labs Lab 12/04/12 0640 12/05/12 0530  WBC 8.1 9.9  HGB 13.9 11.9*  HCT  42.7 37.8  MCV 90.5 91.7  PLT 262 264   Cardiac Enzymes: No results found for this basename: CKTOTAL, CKMB, CKMBINDEX, TROPONINI,  in the last 168 hours BNP (last 3 results)  Recent Labs  09/03/12 0644 10/08/12 1723 10/12/12 0530  PROBNP 936.0* 1356.0* 8213.0*   CBG: No results found for this basename: GLUCAP,  in the last 168 hours  Recent Results (from the past 240 hour(s))  URINE CULTURE     Status: None   Collection Time    12/04/12  6:19 AM      Result Value Range Status   Specimen Description URINE, CLEAN CATCH   Final   Special Requests NONE   Final   Culture  Setup Time 12/04/2012 16:30   Final   Colony Count >=100,000 COLONIES/ML   Final   Culture ESCHERICHIA COLI   Final   Report Status 12/06/2012 FINAL   Final   Organism ID, Bacteria ESCHERICHIA COLI   Final  CSF CULTURE     Status: None   Collection Time    12/04/12  3:17 PM      Result Value Range Status   Specimen Description CSF   Final   Special Requests NONE   Final   Gram Stain     Final   Value: WBC PRESENT, PREDOMINANTLY MONONUCLEAR     NO ORGANISMS SEEN     CYTOSPUN   Culture NO GROWTH 2 DAYS   Final   Report Status PENDING   Incomplete  GRAM STAIN     Status: None   Collection Time    12/04/12  3:17 PM      Result Value Range Status   Specimen Description CSF   Final   Special Requests NONE   Final   Gram Stain     Final   Value: CYTOSPIN     NO WBC SEEN     NO ORGANISMS SEEN   Report Status 12/04/2012 FINAL   Final  CULTURE, BLOOD (ROUTINE X 2)     Status: None   Collection Time    12/04/12  6:35 PM      Result Value Range Status   Specimen Description BLOOD RIGHT HAND   Final   Special Requests BOTTLES DRAWN AEROBIC ONLY 2CC   Final   Culture  Setup Time 12/05/2012 00:54   Final   Culture     Final   Value:        BLOOD CULTURE RECEIVED NO GROWTH TO DATE CULTURE WILL BE HELD FOR 5 DAYS BEFORE ISSUING A FINAL NEGATIVE REPORT   Report Status PENDING   Incomplete  CULTURE, BLOOD  (ROUTINE X 2)     Status: None   Collection Time    12/04/12  6:40  PM      Result Value Range Status   Specimen Description BLOOD RIGHT ARM   Final   Special Requests BOTTLES DRAWN AEROBIC ONLY 6C   Final   Culture  Setup Time 12/05/2012 00:53   Final   Culture     Final   Value:        BLOOD CULTURE RECEIVED NO GROWTH TO DATE CULTURE WILL BE HELD FOR 5 DAYS BEFORE ISSUING A FINAL NEGATIVE REPORT   Report Status PENDING   Incomplete  MRSA PCR SCREENING     Status: None   Collection Time    12/04/12  8:03 PM      Result Value Range Status   MRSA by PCR NEGATIVE  NEGATIVE Final   Comment:            The GeneXpert MRSA Assay (FDA     approved for NASAL specimens     only), is one component of a     comprehensive MRSA colonization     surveillance program. It is not     intended to diagnose MRSA     infection nor to guide or     monitor treatment for     MRSA infections.  CLOSTRIDIUM DIFFICILE BY PCR     Status: Abnormal   Collection Time    12/04/12 10:53 PM      Result Value Range Status   C difficile by pcr POSITIVE (*) NEGATIVE Final   Comment: CRITICAL RESULT CALLED TO, READ BACK BY AND VERIFIED WITH:     G [facility] 12/05/12 0905 BY K SCHULTZ  STOOL CULTURE     Status: None   Collection Time    12/04/12 10:54 PM      Result Value Range Status   Specimen Description STOOL   Final   Special Requests Normal   Final   Culture NO SUSPICIOUS COLONIES, CONTINUING TO HOLD   Final   Report Status PENDING   Incomplete     Studies: No results found.  Scheduled Meds: . dipyridamole-aspirin  1 capsule Oral BID  . donepezil  10 mg Oral QHS  . hydrALAZINE  10 mg Oral Q8H  . hydrocortisone sod succinate (SOLU-CORTEF) injection  50 mg Intravenous Q6H  . levothyroxine  150 mcg Oral QAC breakfast  . saccharomyces boulardii  250 mg Oral BID  . sertraline  50 mg Oral Daily  . simvastatin  20 mg Oral QHS  . vancomycin  125 mg Oral Q6H   Continuous Infusions:     Time spent:  25 MINUTES    Aldred Mase  [facility] Pager [phone]. If 7PM-7AM, please contact night-coverage at [email or web address], password [facility] 12/08/2012, 12:06 PM  LOS: 4 days

## 2012-12-08 NOTE — Progress Notes (Signed)
CRITICAL VALUE ALERT  Critical value received: K+ level 2.5 Date of notification:  12/08/12  Time of notification:  1513 Critical value read back:yes  Nurse who received alert:  K. Beverely Pace, RN  MD notified (1st page):  Dr. Gonzella Lex   Time of first page: 1515  MD notified (2nd page):  Time of second page:  Responding MD: Dr. Gonzella Lex  Time MD responded:  [phone]

## 2012-12-09 DIAGNOSIS — A419 Sepsis, unspecified organism: Secondary | ICD-10-CM | POA: Diagnosis present

## 2012-12-09 DIAGNOSIS — R6520 Severe sepsis without septic shock: Secondary | ICD-10-CM

## 2012-12-09 LAB — BASIC METABOLIC PANEL
BUN: 17 mg/dL (ref 6–23)
Calcium: 8 mg/dL — ABNORMAL LOW (ref 8.4–10.5)
GFR calc Af Amer: 72 mL/min — ABNORMAL LOW (ref >90)
GFR calc non Af Amer: 62 mL/min — ABNORMAL LOW (ref >90)
Glucose, Bld: 126 mg/dL — ABNORMAL HIGH (ref 70–99)
Potassium: 4.2 mEq/L (ref 3.5–5.1)
Sodium: 136 mEq/L (ref 135–145)

## 2012-12-09 MED ORDER — POTASSIUM CHLORIDE 10 MEQ/100ML IV SOLN
10.0000 meq | INTRAVENOUS | Status: AC
  Administered 2012-12-09 (×4): 10 meq via INTRAVENOUS
  Filled 2012-12-09 (×6): qty 100

## 2012-12-09 MED ORDER — HYDRALAZINE HCL 10 MG PO TABS: 10.0000 mg | ORAL_TABLET | Freq: Three times a day (TID) | ORAL | Status: AC

## 2012-12-09 MED ORDER — LEVOTHYROXINE SODIUM 125 MCG PO TABS: 125.0000 ug | ORAL_TABLET | Freq: Every day | ORAL | Status: DC

## 2012-12-09 MED ORDER — LEVOTHYROXINE SODIUM 125 MCG PO TABS
125.0000 ug | ORAL_TABLET | Freq: Every day | ORAL | Status: DC
  Administered 2012-12-09: 125 ug via ORAL
  Filled 2012-12-09 (×3): qty 1

## 2012-12-09 MED ORDER — MEMANTINE HCL 10 MG PO TABS: 10.0000 mg | ORAL_TABLET | Freq: Two times a day (BID) | ORAL | Status: DC

## 2012-12-09 MED ORDER — VANCOMYCIN 50 MG/ML ORAL SOLUTION: 125.0000 mg | Freq: Four times a day (QID) | ORAL | Status: AC

## 2012-12-09 MED ORDER — METOPROLOL TARTRATE 1 MG/ML IV SOLN
5.0000 mg | Freq: Once | INTRAVENOUS | Status: AC
  Administered 2012-12-09: 5 mg via INTRAVENOUS
  Filled 2012-12-09: qty 5

## 2012-12-09 MED ORDER — POTASSIUM CHLORIDE CRYS ER 20 MEQ PO TBCR
40.0000 meq | EXTENDED_RELEASE_TABLET | Freq: Once | ORAL | Status: AC
  Administered 2012-12-09: 40 meq via ORAL
  Filled 2012-12-09: qty 2

## 2012-12-09 NOTE — Progress Notes (Signed)
Pt left unit without belongings and prescriptions. dtr called and asked to pick these items up. dtr (ms, [name]) came in and pick up bags and prescription and took to nursing home. Vwilliams,rn.

## 2012-12-09 NOTE — Progress Notes (Signed)
Called to give report to Drenda Freeze, receiving nurse at [facility].  Nurse Drenda Freeze said she already had pt's paper work and fl2 and so did not need report. Therefore report not given to nurse. Vwilliams,rn.

## 2012-12-09 NOTE — Discharge Summary (Signed)
Physician Discharge Summary  INFINITY JEFFORDS JWJ:[id number] DOB: [birth year] DOA: 12/04/2012  PCP: Cain Saupe, MD  Admit date: 12/04/2012 Discharge date: 12/09/2012  Time spent: 40 minutes  Recommendations for Outpatient Follow-up:  1. Return to assisted living with outpatient PCP followup  Discharge Diagnoses:  Principal Problem:   Sepsis secondary to C. difficile  Active Problems:   Clostridium difficile diarrhea   E-coli UTI  Acute kidney injury   Altered mental status   Bradycardia   Generalized weakness   Hypothyroidism   Depression   Hypokalemia   Dehydration   Discharge Condition: Fair  Diet recommendation: Low sodium  Filed Weights   12/04/12 1500 12/06/12 0500  Weight: 89.2 kg (196 lb 10.4 oz) 92.7 kg (204 lb 5.9 oz)    History of present illness:  Please refer to admission H&P for detail but in brief 79 year old female with a history of hypertension, hypothyroidism, dementia, diastolic CHF recently treated bacterial meningitis was sent from skilled nursing facility with nausea and vomiting and profuse diarrhea secondary to suspected viral GI outbreak admitted with fever and altered mental status.   Hospital Course:    Altered mental status with sepsis Likely in the setting of dehydration and sepsis secondary to c diff diarrhea. Patient presented with low blood pressure, fever and elevated lactate which has now resolved. An LP was done in the ED which is unremarkable. Gram stain is negative. Cultures negative. Patient was empirically started on IV vancomycin, ceftriaxone, ampicillin and acyclovir. Appreciate ID recommendations.   dced empiric coverage as an LP negative. Stool for C diff present . Started on oral vancomycin on 3/12. Diarrhea improving and will treat for a ten-day course  UTI  urine cx growing e coli sensitive to rocephin. Completed a 5 day course of Rocephin. Has chronic UTI and is on Bactrim at home   Acute kidney injury  Likely prerenal  in the setting of dehydration and UTI.  Improved with IV fluids   Bradycardia  Noted to have heart rate in 40s and 50s on telemetry on 3/14. Nurse also reported a 3.6 second pause on telemetry. Low potassium and magnesium replenished. Has PVCs on monitor.  low potassium further replenished. I have discontinued amlodipine and switch to hydralazine. Also patient is on Aricept which can cause bradycardia and I have switched it to Namenda.  hypertension  Switch amlodipine to hydralazine   Diastolic dysfunction  Currently stable   Depression  Continue home medications.   Hypothyroidism  Low TSH noted again. Will reduce Synthroid dose to 137 mcg on dishcarge    Code Status: DO NOT RESUSCITATE   Consultants:  ID Procedures:  LP Antibiotics:  IV vancomycin, ampicillin, ceftriaxone, IV acyclovir (3/11-3/12)  Oral vancomycin ( 3/12--)  IV rocephin ( 3/11--3/15)       Discharge Exam: Filed Vitals:   12/08/12 2200 12/08/12 2330 12/09/12 0200 12/09/12 0600  BP: 180/64 176/88 169/74 169/76  Pulse: 64 84 73 60  Temp: 97.3 F (36.3 C) 97.8 F (36.6 C) 98.1 F (36.7 C) 98.8 F (37.1 C)  TempSrc: Oral Oral Oral Axillary  Resp: 18 20 20 20   Height:      Weight:      SpO2: 98% 98% 96% 97%     Discharge Instructions   General: Elderly female lying in bed in NAD,  HEENT: No pallor, moist oral mucosa Cardiovascular: Normal S1 and S2 no murmurs rub or gallop  Respiratory: Clear to auscultation bilaterally, no added sounds  Abdomen: Soft, nondistended, nontender,  bowel sounds present,  Extremities Warm, no edema,  CNS: AAO x3    Medication List    STOP taking these medications       amLODipine 5 MG tablet  Commonly known as:  NORVASC     donepezil 10 MG tablet  Commonly known as:  ARICEPT      TAKE these medications       acetaminophen 325 MG tablet  Commonly known as:  TYLENOL  Take 650 mg by mouth every 6 (six) hours as needed. For pain     Cranberry 200 MG  Caps  Take 200 mg by mouth 2 (two) times daily.     dipyridamole-aspirin 200-25 MG per 12 hr capsule  Commonly known as:  AGGRENOX  Take 1 capsule by mouth 2 (two) times daily.     hydrALAZINE 10 MG tablet  Commonly known as:  APRESOLINE  Take 1 tablet (10 mg total) by mouth every 8 (eight) hours.     levothyroxine 125 MCG tablet  Commonly known as:  SYNTHROID  Take 1 tablet (125 mcg total) by mouth daily.     loperamide 2 MG capsule  Commonly known as:  IMODIUM  Take 1 capsule (2 mg total) by mouth as needed for diarrhea or loose stools.     memantine 10 MG tablet  Commonly known as:  NAMENDA  Take 1 tablet (10 mg total) by mouth 2 (two) times daily.     multivitamin with minerals Tabs  Take 1 tablet by mouth daily.     omeprazole 20 MG capsule  Commonly known as:  PRILOSEC  Take 20 mg by mouth daily.     polycarbophil 625 MG tablet  Commonly known as:  FIBERCON  Take 625 mg by mouth daily.     potassium chloride SA 20 MEQ tablet  Commonly known as:  K-DUR,KLOR-CON  Take 20 mEq by mouth 2 (two) times daily.     predniSONE 5 MG tablet  Commonly known as:  DELTASONE  Take 10-15 mg by mouth daily. Alternate 10mg  and 15 mg doses     saccharomyces boulardii 250 MG capsule  Commonly known as:  FLORASTOR  Take 250 mg by mouth 2 (two) times daily.     sertraline 50 MG tablet  Commonly known as:  ZOLOFT  Take 50 mg by mouth daily.     simvastatin 20 MG tablet  Commonly known as:  ZOCOR  Take 20 mg by mouth at bedtime.     sulfamethoxazole-trimethoprim 400-80 MG per tablet  Commonly known as:  BACTRIM,SEPTRA  Take 1 tablet by mouth at bedtime.     vancomycin 50 mg/mL oral solution  Commonly known as:  VANCOCIN  Take 2.5 mLs (125 mg total) by mouth every 6 (six) hours. Until 3/21           Follow-up Information   Follow up with FULP, CAMMIE, MD In 1 week.   Contact information:   [address] [place], [place] [address] [place] [address] [phone]         The results of significant diagnostics from this hospitalization (including imaging, microbiology, ancillary and laboratory) are listed below for reference.    Significant Diagnostic Studies: Dg Chest Port 1 View  12/04/2012  *RADIOLOGY REPORT*  Clinical Data: Shortness of breath.  Congestive heart failure.  PORTABLE CHEST - 1 VIEW  Comparison: 10/08/2012  Findings: Cardiomegaly remains stable.  Mild pleural thickening in the left costophrenic sulcus is unchanged.  No evidence of acute infiltrate or  edema.  IMPRESSION: Stable cardiomegaly.  No acute findings.   Original Report Authenticated By: Myles Rosenthal, M.D.     Microbiology: Recent Results (from the past 240 hour(s))  URINE CULTURE     Status: None   Collection Time    12/04/12  6:19 AM      Result Value Range Status   Specimen Description URINE, CLEAN CATCH   Final   Special Requests NONE   Final   Culture  Setup Time 12/04/2012 16:30   Final   Colony Count >=100,000 COLONIES/ML   Final   Culture ESCHERICHIA COLI   Final   Report Status 12/06/2012 FINAL   Final   Organism ID, Bacteria ESCHERICHIA COLI   Final  CSF CULTURE     Status: None   Collection Time    12/04/12  3:17 PM      Result Value Range Status   Specimen Description CSF   Final   Special Requests NONE   Final   Gram Stain     Final   Value: WBC PRESENT, PREDOMINANTLY MONONUCLEAR     NO ORGANISMS SEEN     CYTOSPUN   Culture NO GROWTH 3 DAYS   Final   Report Status 12/08/2012 FINAL   Final  GRAM STAIN     Status: None   Collection Time    12/04/12  3:17 PM      Result Value Range Status   Specimen Description CSF   Final   Special Requests NONE   Final   Gram Stain     Final   Value: CYTOSPIN     NO WBC SEEN     NO ORGANISMS SEEN   Report Status 12/04/2012 FINAL   Final  CULTURE, BLOOD (ROUTINE X 2)     Status: None   Collection Time    12/04/12  6:35 PM      Result Value Range Status   Specimen Description BLOOD RIGHT HAND   Final   Special  Requests BOTTLES DRAWN AEROBIC ONLY 2CC   Final   Culture  Setup Time 12/05/2012 00:54   Final   Culture     Final   Value:        BLOOD CULTURE RECEIVED NO GROWTH TO DATE CULTURE WILL BE HELD FOR 5 DAYS BEFORE ISSUING A FINAL NEGATIVE REPORT   Report Status PENDING   Incomplete  CULTURE, BLOOD (ROUTINE X 2)     Status: None   Collection Time    12/04/12  6:40 PM      Result Value Range Status   Specimen Description BLOOD RIGHT ARM   Final   Special Requests BOTTLES DRAWN AEROBIC ONLY 6C   Final   Culture  Setup Time 12/05/2012 00:53   Final   Culture     Final   Value:        BLOOD CULTURE RECEIVED NO GROWTH TO DATE CULTURE WILL BE HELD FOR 5 DAYS BEFORE ISSUING A FINAL NEGATIVE REPORT   Report Status PENDING   Incomplete  MRSA PCR SCREENING     Status: None   Collection Time    12/04/12  8:03 PM      Result Value Range Status   MRSA by PCR NEGATIVE  NEGATIVE Final   Comment:            The GeneXpert MRSA Assay (FDA     approved for NASAL specimens     only), is one component of a  comprehensive MRSA colonization     surveillance program. It is not     intended to diagnose MRSA     infection nor to guide or     monitor treatment for     MRSA infections.  CLOSTRIDIUM DIFFICILE BY PCR     Status: Abnormal   Collection Time    12/04/12 10:53 PM      Result Value Range Status   C difficile by pcr POSITIVE (*) NEGATIVE Final   Comment: CRITICAL RESULT CALLED TO, READ BACK BY AND VERIFIED WITH:     G [facility] 12/05/12 0905 BY K SCHULTZ  STOOL CULTURE     Status: None   Collection Time    12/04/12 10:54 PM      Result Value Range Status   Specimen Description STOOL   Final   Special Requests Normal   Final   Culture     Final   Value: NO SALMONELLA, SHIGELLA, CAMPYLOBACTER, YERSINIA, OR E.COLI 0157:[id number] ISOLATED   Report Status 12/08/2012 FINAL   Final     Labs: Basic Metabolic Panel:  Recent Labs Lab 12/04/12 0640 12/04/12 1843 12/05/12 0530 12/06/12 0445  12/08/12 1325 12/09/12 0934  NA 138  --  140 136 139 136  K 3.9  --  4.4 3.3* 2.5* 4.2  CL 100  --  106 105 104 106  CO2 21  --  21 21 27 25   GLUCOSE 140*  --  158* 93 115* 126*  BUN 25*  --  33* 22 16 17   CREATININE 1.36*  --  1.98* 1.09 0.88 0.86  CALCIUM 9.2  --  7.9* 8.0* 8.4 8.0*  MG  --  1.8  --  1.9 2.0  --   PHOS  --  4.8*  --   --   --   --    Liver Function Tests:  Recent Labs Lab 12/04/12 0640  AST 19  ALT 18  ALKPHOS 81  BILITOT 0.3  PROT 7.7  ALBUMIN 3.4*    Recent Labs Lab 12/04/12 0640  LIPASE 35   No results found for this basename: AMMONIA,  in the last 168 hours CBC:  Recent Labs Lab 12/04/12 0640 12/05/12 0530  WBC 8.1 9.9  HGB 13.9 11.9*  HCT 42.7 37.8  MCV 90.5 91.7  PLT 262 264   Cardiac Enzymes: No results found for this basename: CKTOTAL, CKMB, CKMBINDEX, TROPONINI,  in the last 168 hours BNP: BNP (last 3 results)  Recent Labs  09/03/12 0644 10/08/12 1723 10/12/12 0530  PROBNP 936.0* 1356.0* 8213.0*   CBG: No results found for this basename: GLUCAP,  in the last 168 hours     Signed:  Eddie North  [facility] 12/09/2012, 12:23 PM

## 2012-12-09 NOTE — Progress Notes (Signed)
Per MD, Pt ready for d/c.  Notified RN, Pt, family and facility.  Sent d/c summary and FL2.  Confirmed receipt of d/c summary and FL2.  Facility ready to receive Pt.  Arranged for transportation.  Amanda Kamryn Messineo, LCSWA Clinical Social Work [phone]   

## 2012-12-09 NOTE — Progress Notes (Signed)
Pt discharged to [facility]. Left unit on stretcher pushed by EMS personnel. Left in good condition. Vwilliams,rn.

## 2012-12-11 ENCOUNTER — Inpatient Hospital Stay (HOSPITAL_COMMUNITY)
Admission: EM | Admit: 2012-12-11 | Discharge: 2012-12-13 | DRG: 871 | Disposition: A | Discharge status: Home Health | Payer: Medicare Other | Attending: Internal Medicine | Admitting: Internal Medicine

## 2012-12-11 ENCOUNTER — Encounter (HOSPITAL_COMMUNITY): Payer: Self-pay | Admitting: Emergency Medicine

## 2012-12-11 ENCOUNTER — Emergency Department (HOSPITAL_COMMUNITY): Payer: Medicare Other

## 2012-12-11 DIAGNOSIS — I2489 Other forms of acute ischemic heart disease: Secondary | ICD-10-CM

## 2012-12-11 DIAGNOSIS — Z66 Do not resuscitate: Secondary | ICD-10-CM | POA: Diagnosis present

## 2012-12-11 DIAGNOSIS — F0390 Unspecified dementia without behavioral disturbance: Secondary | ICD-10-CM

## 2012-12-11 DIAGNOSIS — IMO0002 Reserved for concepts with insufficient information to code with codable children: Secondary | ICD-10-CM

## 2012-12-11 DIAGNOSIS — M316 Other giant cell arteritis: Secondary | ICD-10-CM

## 2012-12-11 DIAGNOSIS — R531 Weakness: Secondary | ICD-10-CM

## 2012-12-11 DIAGNOSIS — G009 Bacterial meningitis, unspecified: Secondary | ICD-10-CM

## 2012-12-11 DIAGNOSIS — R079 Chest pain, unspecified: Secondary | ICD-10-CM

## 2012-12-11 DIAGNOSIS — I5033 Acute on chronic diastolic (congestive) heart failure: Secondary | ICD-10-CM

## 2012-12-11 DIAGNOSIS — Z515 Encounter for palliative care: Secondary | ICD-10-CM

## 2012-12-11 DIAGNOSIS — R001 Bradycardia, unspecified: Secondary | ICD-10-CM

## 2012-12-11 DIAGNOSIS — Z792 Long term (current) use of antibiotics: Secondary | ICD-10-CM

## 2012-12-11 DIAGNOSIS — N390 Urinary tract infection, site not specified: Secondary | ICD-10-CM

## 2012-12-11 DIAGNOSIS — N179 Acute kidney failure, unspecified: Secondary | ICD-10-CM

## 2012-12-11 DIAGNOSIS — Z8719 Personal history of other diseases of the digestive system: Secondary | ICD-10-CM

## 2012-12-11 DIAGNOSIS — G459 Transient cerebral ischemic attack, unspecified: Secondary | ICD-10-CM

## 2012-12-11 DIAGNOSIS — R4182 Altered mental status, unspecified: Secondary | ICD-10-CM

## 2012-12-11 DIAGNOSIS — I509 Heart failure, unspecified: Secondary | ICD-10-CM

## 2012-12-11 DIAGNOSIS — I5032 Chronic diastolic (congestive) heart failure: Secondary | ICD-10-CM

## 2012-12-11 DIAGNOSIS — A0472 Enterocolitis due to Clostridium difficile, not specified as recurrent: Secondary | ICD-10-CM

## 2012-12-11 DIAGNOSIS — Z85038 Personal history of other malignant neoplasm of large intestine: Secondary | ICD-10-CM

## 2012-12-11 DIAGNOSIS — K649 Unspecified hemorrhoids: Secondary | ICD-10-CM | POA: Diagnosis present

## 2012-12-11 DIAGNOSIS — R0603 Acute respiratory distress: Secondary | ICD-10-CM

## 2012-12-11 DIAGNOSIS — I5030 Unspecified diastolic (congestive) heart failure: Secondary | ICD-10-CM

## 2012-12-11 DIAGNOSIS — R0902 Hypoxemia: Secondary | ICD-10-CM

## 2012-12-11 DIAGNOSIS — G609 Hereditary and idiopathic neuropathy, unspecified: Secondary | ICD-10-CM | POA: Diagnosis present

## 2012-12-11 DIAGNOSIS — I4891 Unspecified atrial fibrillation: Secondary | ICD-10-CM

## 2012-12-11 DIAGNOSIS — Z79899 Other long term (current) drug therapy: Secondary | ICD-10-CM

## 2012-12-11 DIAGNOSIS — R112 Nausea with vomiting, unspecified: Secondary | ICD-10-CM

## 2012-12-11 DIAGNOSIS — G629 Polyneuropathy, unspecified: Secondary | ICD-10-CM

## 2012-12-11 DIAGNOSIS — E8770 Fluid overload, unspecified: Secondary | ICD-10-CM

## 2012-12-11 DIAGNOSIS — I10 Essential (primary) hypertension: Secondary | ICD-10-CM

## 2012-12-11 DIAGNOSIS — D649 Anemia, unspecified: Secondary | ICD-10-CM

## 2012-12-11 DIAGNOSIS — E876 Hypokalemia: Secondary | ICD-10-CM

## 2012-12-11 DIAGNOSIS — Z8673 Personal history of transient ischemic attack (TIA), and cerebral infarction without residual deficits: Secondary | ICD-10-CM

## 2012-12-11 DIAGNOSIS — J189 Pneumonia, unspecified organism: Secondary | ICD-10-CM

## 2012-12-11 DIAGNOSIS — G929 Unspecified toxic encephalopathy: Secondary | ICD-10-CM

## 2012-12-11 DIAGNOSIS — K921 Melena: Secondary | ICD-10-CM

## 2012-12-11 DIAGNOSIS — E785 Hyperlipidemia, unspecified: Secondary | ICD-10-CM

## 2012-12-11 DIAGNOSIS — E039 Hypothyroidism, unspecified: Secondary | ICD-10-CM

## 2012-12-11 DIAGNOSIS — Z9889 Other specified postprocedural states: Secondary | ICD-10-CM

## 2012-12-11 DIAGNOSIS — Z9981 Dependence on supplemental oxygen: Secondary | ICD-10-CM

## 2012-12-11 DIAGNOSIS — J9600 Acute respiratory failure, unspecified whether with hypoxia or hypercapnia: Secondary | ICD-10-CM

## 2012-12-11 DIAGNOSIS — R600 Localized edema: Secondary | ICD-10-CM

## 2012-12-11 DIAGNOSIS — E860 Dehydration: Secondary | ICD-10-CM

## 2012-12-11 DIAGNOSIS — I119 Hypertensive heart disease without heart failure: Secondary | ICD-10-CM

## 2012-12-11 DIAGNOSIS — Z7902 Long term (current) use of antithrombotics/antiplatelets: Secondary | ICD-10-CM

## 2012-12-11 DIAGNOSIS — F32A Depression, unspecified: Secondary | ICD-10-CM

## 2012-12-11 DIAGNOSIS — K625 Hemorrhage of anus and rectum: Secondary | ICD-10-CM

## 2012-12-11 DIAGNOSIS — R627 Adult failure to thrive: Secondary | ICD-10-CM

## 2012-12-11 DIAGNOSIS — D72829 Elevated white blood cell count, unspecified: Secondary | ICD-10-CM

## 2012-12-11 DIAGNOSIS — C189 Malignant neoplasm of colon, unspecified: Secondary | ICD-10-CM

## 2012-12-11 DIAGNOSIS — B9620 Unspecified Escherichia coli [E. coli] as the cause of diseases classified elsewhere: Secondary | ICD-10-CM

## 2012-12-11 DIAGNOSIS — A419 Sepsis, unspecified organism: Principal | ICD-10-CM

## 2012-12-11 HISTORY — DX: Other giant cell arteritis: M31.6

## 2012-12-11 LAB — COMPREHENSIVE METABOLIC PANEL
ALT: 16 U/L (ref 0–35)
Alkaline Phosphatase: 58 U/L (ref 39–117)
BUN: 12 mg/dL (ref 6–23)
CO2: 26 mEq/L (ref 19–32)
Chloride: 105 mEq/L (ref 96–112)
GFR calc Af Amer: 63 mL/min — ABNORMAL LOW (ref >90)
Glucose, Bld: 75 mg/dL (ref 70–99)
Potassium: 3.5 mEq/L (ref 3.5–5.1)
Total Bilirubin: 0.3 mg/dL (ref 0.3–1.2)

## 2012-12-11 LAB — CULTURE, BLOOD (ROUTINE X 2): Culture: NO GROWTH

## 2012-12-11 LAB — URINALYSIS, ROUTINE W REFLEX MICROSCOPIC
Bilirubin Urine: NEGATIVE
Ketones, ur: NEGATIVE mg/dL
Leukocytes, UA: NEGATIVE
Nitrite: NEGATIVE
Protein, ur: NEGATIVE mg/dL

## 2012-12-11 LAB — CBC WITH DIFFERENTIAL/PLATELET
Lymphocytes Relative: 27 % (ref 12–46)
MCH: 28.9 pg (ref 26.0–34.0)
MCHC: 32.3 g/dL (ref 30.0–36.0)
Neutrophils Relative %: 66 % (ref 43–77)
Platelets: 234 10*3/uL (ref 150–400)

## 2012-12-11 MED ORDER — POTASSIUM CHLORIDE CRYS ER 20 MEQ PO TBCR
20.0000 meq | EXTENDED_RELEASE_TABLET | Freq: Two times a day (BID) | ORAL | Status: DC
  Administered 2012-12-11 – 2012-12-13 (×4): 20 meq via ORAL
  Filled 2012-12-11 (×5): qty 1

## 2012-12-11 MED ORDER — SERTRALINE HCL 50 MG PO TABS
50.0000 mg | ORAL_TABLET | Freq: Every day | ORAL | Status: DC
  Administered 2012-12-12 – 2012-12-13 (×2): 50 mg via ORAL
  Filled 2012-12-11 (×3): qty 1

## 2012-12-11 MED ORDER — SIMVASTATIN 20 MG PO TABS
20.0000 mg | ORAL_TABLET | Freq: Every day | ORAL | Status: DC
  Administered 2012-12-11 – 2012-12-12 (×2): 20 mg via ORAL
  Filled 2012-12-11 (×3): qty 1

## 2012-12-11 MED ORDER — SODIUM CHLORIDE 0.9 % IV SOLN: INTRAVENOUS | Status: AC

## 2012-12-11 MED ORDER — ONDANSETRON HCL 4 MG/2ML IJ SOLN: 4.0000 mg | Freq: Four times a day (QID) | INTRAMUSCULAR | Status: DC | PRN

## 2012-12-11 MED ORDER — PREDNISONE 5 MG PO TABS
15.0000 mg | ORAL_TABLET | ORAL | Status: DC
  Administered 2012-12-12: 15 mg via ORAL
  Filled 2012-12-11: qty 1

## 2012-12-11 MED ORDER — SACCHAROMYCES BOULARDII 250 MG PO CAPS
250.0000 mg | ORAL_CAPSULE | Freq: Two times a day (BID) | ORAL | Status: DC
  Administered 2012-12-11 – 2012-12-13 (×4): 250 mg via ORAL
  Filled 2012-12-11 (×5): qty 1

## 2012-12-11 MED ORDER — ONDANSETRON HCL 4 MG/2ML IJ SOLN: 4.0000 mg | Freq: Three times a day (TID) | INTRAMUSCULAR | Status: DC | PRN

## 2012-12-11 MED ORDER — LEVOTHYROXINE SODIUM 125 MCG PO TABS
125.0000 ug | ORAL_TABLET | Freq: Every day | ORAL | Status: DC
  Administered 2012-12-12 – 2012-12-13 (×2): 125 ug via ORAL
  Filled 2012-12-11 (×3): qty 1

## 2012-12-11 MED ORDER — SODIUM CHLORIDE 0.9 % IV BOLUS (SEPSIS): 500.0000 mL | Freq: Once | INTRAVENOUS | Status: DC

## 2012-12-11 MED ORDER — ADULT MULTIVITAMIN W/MINERALS CH
1.0000 | ORAL_TABLET | Freq: Every day | ORAL | Status: DC
  Administered 2012-12-12 – 2012-12-13 (×2): 1 via ORAL
  Filled 2012-12-11 (×2): qty 1

## 2012-12-11 MED ORDER — POTASSIUM CHLORIDE IN NACL 20-0.9 MEQ/L-% IV SOLN
INTRAVENOUS | Status: DC
  Administered 2012-12-11 – 2012-12-12 (×2): via INTRAVENOUS
  Filled 2012-12-11 (×3): qty 1000

## 2012-12-11 MED ORDER — HYDRALAZINE HCL 10 MG PO TABS
10.0000 mg | ORAL_TABLET | Freq: Three times a day (TID) | ORAL | Status: DC
  Administered 2012-12-11 – 2012-12-13 (×7): 10 mg via ORAL
  Filled 2012-12-11 (×9): qty 1

## 2012-12-11 MED ORDER — MEMANTINE HCL 10 MG PO TABS
10.0000 mg | ORAL_TABLET | Freq: Two times a day (BID) | ORAL | Status: DC
  Administered 2012-12-11 – 2012-12-13 (×4): 10 mg via ORAL
  Filled 2012-12-11 (×5): qty 1

## 2012-12-11 MED ORDER — DONEPEZIL HCL 10 MG PO TABS
10.0000 mg | ORAL_TABLET | Freq: Every day | ORAL | Status: DC
  Administered 2012-12-11 – 2012-12-12 (×2): 10 mg via ORAL
  Filled 2012-12-11 (×3): qty 1

## 2012-12-11 MED ORDER — CRANBERRY 200 MG PO CAPS: 500.0000 mg | ORAL_CAPSULE | Freq: Two times a day (BID) | ORAL | Status: DC

## 2012-12-11 MED ORDER — PREDNISONE 10 MG PO TABS
10.0000 mg | ORAL_TABLET | ORAL | Status: DC
  Administered 2012-12-13: 10 mg via ORAL
  Filled 2012-12-11: qty 1

## 2012-12-11 MED ORDER — VANCOMYCIN 50 MG/ML ORAL SOLUTION
125.0000 mg | Freq: Four times a day (QID) | ORAL | Status: DC
  Administered 2012-12-11 – 2012-12-13 (×8): 125 mg via ORAL
  Filled 2012-12-11 (×12): qty 2.5

## 2012-12-11 MED ORDER — AMLODIPINE BESYLATE 5 MG PO TABS
5.0000 mg | ORAL_TABLET | Freq: Every morning | ORAL | Status: DC
  Administered 2012-12-12 – 2012-12-13 (×2): 5 mg via ORAL
  Filled 2012-12-11 (×2): qty 1

## 2012-12-11 MED ORDER — ONDANSETRON HCL 4 MG PO TABS: 4.0000 mg | ORAL_TABLET | Freq: Four times a day (QID) | ORAL | Status: DC | PRN

## 2012-12-11 MED ORDER — HYDROCORTISONE 2.5 % RE CREA
TOPICAL_CREAM | Freq: Four times a day (QID) | RECTAL | Status: DC
  Administered 2012-12-11: 1 via RECTAL
  Administered 2012-12-12 – 2012-12-13 (×4): via RECTAL
  Filled 2012-12-11 (×4): qty 28.35

## 2012-12-11 MED ORDER — CALCIUM POLYCARBOPHIL 625 MG PO TABS
625.0000 mg | ORAL_TABLET | Freq: Every day | ORAL | Status: DC
  Administered 2012-12-12 – 2012-12-13 (×2): 625 mg via ORAL
  Filled 2012-12-11 (×2): qty 1

## 2012-12-11 MED ORDER — SODIUM CHLORIDE 0.9 % IV BOLUS (SEPSIS)
500.0000 mL | Freq: Once | INTRAVENOUS | Status: AC
  Administered 2012-12-11: 500 mL via INTRAVENOUS

## 2012-12-11 MED ORDER — ASPIRIN-DIPYRIDAMOLE ER 25-200 MG PO CP12
1.0000 | ORAL_CAPSULE | Freq: Two times a day (BID) | ORAL | Status: DC
  Administered 2012-12-11 – 2012-12-13 (×4): 1 via ORAL
  Filled 2012-12-11 (×5): qty 1

## 2012-12-11 NOTE — Progress Notes (Signed)
PHARMACIST - PHYSICIAN ORDER COMMUNICATION  CONCERNING: P&T Medication Policy on Herbal Medications  DESCRIPTION:  This patient's order for:  Cranberry  has been noted.  This product(s) is classified as an "herbal" or natural product. Due to a lack of definitive safety studies or FDA approval, nonstandard manufacturing practices, plus the potential risk of unknown drug-drug interactions while on inpatient medications, the Pharmacy and Therapeutics Committee does not permit the use of "herbal" or natural products of this type within [facility].   ACTION TAKEN: The pharmacy department is unable to verify this order at this time and your patient has been informed of this safety policy. Please reevaluate patient's clinical condition at discharge and address if the herbal or natural product(s) should be resumed at that time.  Thank youTerrilee Files 12/11/12 @ 17:21

## 2012-12-11 NOTE — ED Notes (Signed)
Pt comes from [facility] where EMS was called out for unresponsiveness. Pt will respond when you speak to her, pt states "I feel like crap" when asked what is wrong with her.  Per nursing facility staff pt was sitting upright and talking yesterday and today pt very weak.  Pt has PMH ring worm and c diff.

## 2012-12-11 NOTE — ED Notes (Signed)
[id number]<AV> Expected date:<BR> Expected time:<BR> Means of arrival:<BR> Comments:<BR> Elderly AMS

## 2012-12-11 NOTE — ED Notes (Signed)
Lab notified to come draw blood specimens.

## 2012-12-11 NOTE — ED Notes (Signed)
IV team made aware pt needs IV access and blood draw.

## 2012-12-11 NOTE — Progress Notes (Signed)
Thank you for consulting the Palliative Medicine Team at [facility] to meet your patient's and family's needs.   The reason that you asked [place] to see your patient is for goals of care, hospice options  We have scheduled your patient for a meeting: tomorrow, Wednesday 12/12/12 @ 8:30 am  The Surrogate decision make is: daughter [name] Contact information: c: [phone]; [phone]  Other family members that need to be present: possibly son-in-law, Rosanne Ashing  Additional information: spoke with patient's staff RN Eber Jones, patient recently arrived to floor from ED; per RN appears comfortable daughter Angelique Blonder at bedside spoke with Angelique Blonder who also indicated patient was resting comfortably at this time - Angelique Blonder is agreeable to PMT meeting tomorrow, as noted above    Valente David, RN 12/11/2012, 5:47 PM Palliative Medicine Team RN Liaison [phone]

## 2012-12-11 NOTE — ED Provider Notes (Signed)
History     CSN: [id number]  Arrival date & time 12/11/12  1020   First MD Initiated Contact with Patient 12/11/12 1024      Chief Complaint  Patient presents with  . feels bad     (Consider location/radiation/quality/duration/timing/severity/associated sxs/prior treatment) HPI [name] is a 79 y.o. female who presents from [facility] with complaint of weakness and altered mental status. Per SNF, pt was recently hospitalized for sepsis and C diff. She was doing better, yesterday, was sitting up, talking, today very solmnolent, sleeping. Pt states she feels "bad." Denies any pain. Denies vomiting. States unsure if still having diarrhea. Unable to give more history, states "I just feel bad and weak."   Past Medical History  Diagnosis Date  . Peripheral neuropathy   . Hypertension   . TIA (transient ischemic attack)   . Elevated sed rate   . Temporal arteritis     chronic steroids  . Hypothyroidism   . Colon cancer     colon  . History of hernia repair   . S/P rotator cuff surgery     right  . Diastolic CHF   . Cardiomegaly - hypertensive   . Chronic anemia   . Chronic steroid use   . Dementia   . Shortness of breath   . Hyperlipidemia   . CHF (congestive heart failure)   . Cardiomyopathy due to hypertension   . Dependence on supplemental oxygen   . Chronic UTI   . Dementia     Past Surgical History  Procedure Laterality Date  . Tonsillectomy    . Appendectomy    . Abdominal hysterectomy    . Colectomy      No family history on file.  History  Substance Use Topics  . Smoking status: Never Smoker   . Smokeless tobacco: Never Used  . Alcohol Use: No    OB History   Grav Para Term Preterm Abortions TAB SAB Ect Mult Living                  Review of Systems  Unable to perform ROS: Mental status change    Allergies  Apap; Ciprofloxacin; Codeine; Demerol; Morphine and related; Nitrofurantoin monohyd macro; Pregabalin; and  Septra  Home Medications   Current Outpatient Rx  Name  Route  Sig  Dispense  Refill  . acetaminophen (TYLENOL) 325 MG tablet   Oral   Take 650 mg by mouth every 6 (six) hours as needed. For pain         . amLODipine (NORVASC) 5 MG tablet   Oral   Take 5 mg by mouth every morning.         . Cranberry 200 MG CAPS   Oral   Take 500 mg by mouth 2 (two) times daily.          Marland Kitchen dipyridamole-aspirin (AGGRENOX) 25-200 MG per 12 hr capsule   Oral   Take 1 capsule by mouth 2 (two) times daily.           Marland Kitchen donepezil (ARICEPT) 10 MG tablet   Oral   Take 10 mg by mouth at bedtime.         . hydrALAZINE (APRESOLINE) 10 MG tablet   Oral   Take 1 tablet (10 mg total) by mouth every 8 (eight) hours.   90 tablet   0   . levothyroxine (SYNTHROID) 125 MCG tablet   Oral   Take 1 tablet (125 mcg total)  by mouth daily.   30 tablet   0   . loperamide (IMODIUM) 2 MG capsule   Oral   Take 1 capsule (2 mg total) by mouth as needed for diarrhea or loose stools.   30 capsule   0   . memantine (NAMENDA) 10 MG tablet   Oral   Take 1 tablet (10 mg total) by mouth 2 (two) times daily.   30 tablet   0   . Multiple Vitamin (MULITIVITAMIN WITH MINERALS) TABS   Oral   Take 1 tablet by mouth daily.           Marland Kitchen omeprazole (PRILOSEC) 20 MG capsule   Oral   Take 20 mg by mouth daily.           . polycarbophil (FIBERCON) 625 MG tablet   Oral   Take 625 mg by mouth daily.         . potassium chloride SA (K-DUR,KLOR-CON) 20 MEQ tablet   Oral   Take 20 mEq by mouth 2 (two) times daily.         . predniSONE (DELTASONE) 5 MG tablet   Oral   Take 10-15 mg by mouth daily. Alternate 10mg  and 15 mg doses         . saccharomyces boulardii (FLORASTOR) 250 MG capsule   Oral   Take 250 mg by mouth 2 (two) times daily.         . sertraline (ZOLOFT) 50 MG tablet   Oral   Take 50 mg by mouth daily.           . simvastatin (ZOCOR) 20 MG tablet   Oral   Take 20 mg by  mouth at bedtime.           . sulfamethoxazole-trimethoprim (BACTRIM,SEPTRA) 400-80 MG per tablet   Oral   Take 1 tablet by mouth at bedtime.         . vancomycin (VANCOCIN) 50 mg/mL oral solution   Oral   Take 2.5 mLs (125 mg total) by mouth every 6 (six) hours.   60 mL   0     BP 184/53  Pulse 69  Temp(Src) 98.3 F (36.8 C) (Rectal)  Resp 20  SpO2 97%  Physical Exam  Nursing note and vitals reviewed. Constitutional: She is oriented to person, place, and time. She appears well-developed and well-nourished.  Ill appearing, solmnolent  HENT:  Head: Normocephalic and atraumatic.  Eyes: Conjunctivae and EOM are normal.  Right pupil 3mm, reactive. Left pupil constricted, sluggish  Neck: Neck supple.  Cardiovascular: Normal rate, regular rhythm and normal heart sounds.   Pulmonary/Chest: Effort normal and breath sounds normal. No respiratory distress. She has no wheezes. She has no rales.  Abdominal: Soft. Bowel sounds are normal. She exhibits no distension. There is tenderness. There is no rebound and no guarding.  Diffuse tenderness  Genitourinary:  Bright red blood per rectum. Large non thrombosed external hemorrhoids  Musculoskeletal: She exhibits edema.  Trace LE edema bilaterally. Extensive bruising, over extremities.   Neurological: She is alert and oriented to person, place, and time.  5/5 and equal upper and lower extremity strength bilaterally. Equal grip strength bilaterally. No pronator drift.    Skin: Skin is warm and dry.  Multiple bruising to entire body. Petechia to the right ankle and dorsal foot    ED Course  Procedures (including critical care time)  Results for orders placed during the hospital encounter of 12/11/12  CBC WITH DIFFERENTIAL  Result Value Range   WBC 8.9  4.0 - 10.5 K/uL   RBC 3.91  3.87 - 5.11 MIL/uL   Hemoglobin 11.3 (*) 12.0 - 15.0 g/dL   HCT 16.1 (*) 09.6 - 04.5 %   MCV 89.5  78.0 - 100.0 fL   MCH 28.9  26.0 - 34.0 pg    MCHC 32.3  30.0 - 36.0 g/dL   RDW 40.9  81.1 - 91.4 %   Platelets 234  150 - 400 K/uL   Neutrophils Relative 66  43 - 77 %   Lymphocytes Relative 27  12 - 46 %   Monocytes Relative 5  3 - 12 %   Eosinophils Relative 2  0 - 5 %   Basophils Relative 0  0 - 1 %   Neutro Abs 5.9  1.7 - 7.7 K/uL   Lymphs Abs 2.4  0.7 - 4.0 K/uL   Monocytes Absolute 0.4  0.1 - 1.0 K/uL   Eosinophils Absolute 0.2  0.0 - 0.7 K/uL   Basophils Absolute 0.0  0.0 - 0.1 K/uL   WBC Morphology ATYPICAL LYMPHOCYTES     Smear Review PLATELET CLUMPS NOTED ON SMEAR    COMPREHENSIVE METABOLIC PANEL      Result Value Range   Sodium 139  135 - 145 mEq/L   Potassium 3.5  3.5 - 5.1 mEq/L   Chloride 105  96 - 112 mEq/L   CO2 26  19 - 32 mEq/L   Glucose, Bld 75  70 - 99 mg/dL   BUN 12  6 - 23 mg/dL   Creatinine, Ser 7.82  0.50 - 1.10 mg/dL   Calcium 8.3 (*) 8.4 - 10.5 mg/dL   Total Protein 5.6 (*) 6.0 - 8.3 g/dL   Albumin 2.5 (*) 3.5 - 5.2 g/dL   AST 17  0 - 37 U/L   ALT 16  0 - 35 U/L   Alkaline Phosphatase 58  39 - 117 U/L   Total Bilirubin 0.3  0.3 - 1.2 mg/dL   GFR calc non Af Amer 54 (*) >90 mL/min   GFR calc Af Amer 63 (*) >90 mL/min  URINALYSIS, ROUTINE W REFLEX MICROSCOPIC      Result Value Range   Color, Urine YELLOW  YELLOW   APPearance CLEAR  CLEAR   Specific Gravity, Urine 1.010  1.005 - 1.030   pH 5.5  5.0 - 8.0   Glucose, UA NEGATIVE  NEGATIVE mg/dL   Hgb urine dipstick NEGATIVE  NEGATIVE   Bilirubin Urine NEGATIVE  NEGATIVE   Ketones, ur NEGATIVE  NEGATIVE mg/dL   Protein, ur NEGATIVE  NEGATIVE mg/dL   Urobilinogen, UA 0.2  0.0 - 1.0 mg/dL   Nitrite NEGATIVE  NEGATIVE   Leukocytes, UA NEGATIVE  NEGATIVE  CG4 I-STAT (LACTIC ACID)      Result Value Range   Lactic Acid, Venous 1.04  0.5 - 2.2 mmol/L  POCT I-STAT TROPONIN I      Result Value Range   Troponin i, poc 0.05  0.00 - 0.08 ng/mL   Comment 3            Ct Head Wo Contrast  12/11/2012  *RADIOLOGY REPORT*  Clinical Data: Altered mental  status.  Weakness.  CT HEAD WITHOUT CONTRAST  Technique:  Contiguous axial images were obtained from the base of the skull through the vertex without contrast.  Comparison: 10/08/2012 and brain MR dated 06/18/2010.  Findings: Again demonstrated is mild linearly increased density in the lateral  aspect of the pons on the left and diffuse central pontine low density.  No significant change in diffuse enlargement of the ventricles and subarachnoid spaces and patchy white matter low density in both cerebral hemispheres.  No intracranial hemorrhage, mass lesion or CT evidence of acute infarction. Unremarkable bones and paranasal sinuses.  IMPRESSION:  1.  No acute abnormality. 2.  Stable atrophy, extensive chronic small vessel white matter ischemic changes in both cerebral hemispheres and extensive chronic small vessel ischemic changes in the pons.   Original Report Authenticated By: Beckie Salts, M.D.    Dg Chest Portable 1 View  12/11/2012  *RADIOLOGY REPORT*  Clinical Data: Altered mental status  PORTABLE CHEST - 1 VIEW  Comparison: 12/04/2012  Findings: Chronic interstitial markings.  No frank interstitial edema.  Mild patchy left basilar opacity, likely atelectasis.  No pneumothorax.  Cardiomegaly.  IMPRESSION: Cardiomegaly.  No frank interstitial edema.  Mild patchy left basilar opacity, likely atelectasis.   Original Report Authenticated By: Charline Bills, M.D.        1. Altered mental status   2. Bright red blood per rectum   3. Weakness       MDM  Pt with weakness, altered mental status onset today. Afebrile rectally. Hypertensive. Labs at baseline. Negative CT head. UA clear. Currently being treated for c diff. Will admit for further testing and evaluation. Pt is DNR.   Filed Vitals:   12/11/12 1302 12/11/12 1330 12/11/12 1400 12/11/12 1714  BP: 188/60 177/71 182/69 154/57  Pulse: 73 70 65 69  Temp:    97.8 F (36.6 C)  TempSrc:    Oral  Resp: 18 23 19 18   Height:    5\' 10"  (1.778 m)   Weight:    204 lb (92.534 kg)  SpO2: 98% 97% 96% 97%           Lottie Mussel, PA-C 12/11/12 2111

## 2012-12-11 NOTE — H&P (Signed)
[facility] History and Physical  [name] ZOX:[id number] DOB: 12/15/1929 DOA: 12/11/2012  Referring physician: Dr. Donnetta Hutching PCP: Cain Saupe, MD   Chief Complaint: Altered mental status   History of Present Illness: [name] is an 79 y.o. female with a PMH of recent hospitalization 12/04/12-12/09/12 where she was treated for sepsis secondary to E. Coli UTI and C. Difficile colitis.  She was ultimately discharged to the [facility] on Septra and oral Vancomycin. She was recuperating well up until this morning, when she had an abrupt change in her mental status.  She became somnolent and difficult to wake up, but no frank confusion or disorientation. She has been a bit weak and her daughter, who is at the bedside, says the only complaints she has had was that she felt "cold", and that her right great toe was hurting. No history of gout. States that she "just feels bad" and weak and sleepy. Appetite has been normal up until today. No aggravating or alleviating factors. Noted to have anisocoria  by the emergency department PA, the daughter confirms that this is long-standing and related to prior cataract surgery.  The patient has had 4 hospitalizations in the past 6 months and is failing to thrive and therefore the daughter has raised the possibility of hospice care.  Review of Systems: Constitutional: No fever, + chills;  Appetite normal; No weight loss, no weight gain.  HEENT: No blurry vision, no diplopia, no pharyngitis, no dysphagia CV: No chest pain, no palpitations.  Resp: No SOB, no cough. GI: No nausea, no vomiting, no diarrhea, no melena, + hemorrhoids with hematochezia.  GU: No dysuria, no hematuria, +urinary incontinence.  MSK: no myalgias, no arthralgias.  Neuro:  No headache, no focal neurological deficits, no history of seizures.  Psych: No depression, no anxiety.  Endo: + thyroid disease, no DM, no heat intolerance, no cold intolerance, no polyuria, no  polydipsia  Skin: No rashes, no skin lesions.  Heme: + easy bruising, no history of blood diseases.  Past Medical History Past Medical History  Diagnosis Date  . Peripheral neuropathy   . Hypertension   . TIA (transient ischemic attack)   . Elevated sed rate   . Temporal arteritis     chronic steroids  . Hypothyroidism   . Colon cancer     colon  . History of hernia repair   . S/P rotator cuff surgery     right  . Diastolic CHF   . Cardiomegaly - hypertensive   . Chronic anemia   . Chronic steroid use   . Dementia   . Shortness of breath   . Hyperlipidemia   . CHF (congestive heart failure)   . Cardiomyopathy due to hypertension   . Dependence on supplemental oxygen   . Chronic UTI   . Dementia   . Giant cell arteritis      Past Surgical History Past Surgical History  Procedure Laterality Date  . Tonsillectomy  1939  . Colectomy  1987  . Abdominal hysterectomy  1974  . Appendectomy  1947  . Hernia repair  1990  . Cholecystectomy  2000  . Rotator cuff repair  2004  . Right temporal artery biopsy  10/1998, 10/2009  . Right cataract extraction  2011  . Eye surgery    . Lumbar puncture  10/07/2004     Social History: History   Social History  . Marital Status: Widowed    Spouse Name: N/A    Number  of Children: 2  . Years of Education: N/A   Occupational History  . RETIRED    Social History Main Topics  . Smoking status: Never Smoker   . Smokeless tobacco: Never Used  . Alcohol Use: No  . Drug Use: No  . Sexually Active: Not Currently   Other Topics Concern  . Not on file   Social History Narrative   Widowed.  Has lived with her daughter for the past 2.5 years, but most recently d/c'd to ALF/SNF.  Ambulates with a walker.    Family History:  Family History  Problem Relation Age of Onset  . Dementia Mother   . Heart attack Father     Allergies: Apap; Ciprofloxacin; Codeine; Demerol; Morphine and related; Nitrofurantoin monohyd macro;  Pregabalin; and Septra  Meds: Prior to Admission medications   Medication Sig Start Date End Date Taking? Authorizing Provider  acetaminophen (TYLENOL) 325 MG tablet Take 650 mg by mouth every 6 (six) hours as needed. For pain   Yes Historical Provider, MD  amLODipine (NORVASC) 5 MG tablet Take 5 mg by mouth every morning.   Yes Historical Provider, MD  Cranberry 200 MG CAPS Take 500 mg by mouth 2 (two) times daily.    Yes Historical Provider, MD  dipyridamole-aspirin (AGGRENOX) 25-200 MG per 12 hr capsule Take 1 capsule by mouth 2 (two) times daily.     Yes Historical Provider, MD  donepezil (ARICEPT) 10 MG tablet Take 10 mg by mouth at bedtime.   Yes Historical Provider, MD  hydrALAZINE (APRESOLINE) 10 MG tablet Take 1 tablet (10 mg total) by mouth every 8 (eight) hours. 12/09/12  Yes Nishant Dhungel, MD  levothyroxine (SYNTHROID) 125 MCG tablet Take 1 tablet (125 mcg total) by mouth daily. 12/09/12  Yes Nishant Dhungel, MD  loperamide (IMODIUM) 2 MG capsule Take 1 capsule (2 mg total) by mouth as needed for diarrhea or loose stools. 09/07/12  Yes Rhetta Mura, MD  memantine (NAMENDA) 10 MG tablet Take 1 tablet (10 mg total) by mouth 2 (two) times daily. 12/09/12  Yes Nishant Dhungel, MD  Multiple Vitamin (MULITIVITAMIN WITH MINERALS) TABS Take 1 tablet by mouth daily.     Yes Historical Provider, MD  omeprazole (PRILOSEC) 20 MG capsule Take 20 mg by mouth daily.     Yes Historical Provider, MD  polycarbophil (FIBERCON) 625 MG tablet Take 625 mg by mouth daily.   Yes Historical Provider, MD  potassium chloride SA (K-DUR,KLOR-CON) 20 MEQ tablet Take 20 mEq by mouth 2 (two) times daily.   Yes Historical Provider, MD  predniSONE (DELTASONE) 5 MG tablet Take 10-15 mg by mouth daily. Alternate 10mg  and 15 mg doses   Yes Historical Provider, MD  saccharomyces boulardii (FLORASTOR) 250 MG capsule Take 250 mg by mouth 2 (two) times daily.   Yes Historical Provider, MD  sertraline (ZOLOFT) 50 MG  tablet Take 50 mg by mouth daily.     Yes Historical Provider, MD  simvastatin (ZOCOR) 20 MG tablet Take 20 mg by mouth at bedtime.     Yes Historical Provider, MD  sulfamethoxazole-trimethoprim (BACTRIM,SEPTRA) 400-80 MG per tablet Take 1 tablet by mouth at bedtime.   Yes Historical Provider, MD  vancomycin (VANCOCIN) 50 mg/mL oral solution Take 2.5 mLs (125 mg total) by mouth every 6 (six) hours. 12/09/12 12/14/12 Yes Nishant Dhungel, MD    Physical Exam: Filed Vitals:   12/11/12 1028 12/11/12 1036 12/11/12 1302  BP:  184/53 188/60  Pulse:  69 73  Temp:  98.3 F (36.8 C)   TempSrc:  Rectal   Resp:  20 18  SpO2: 96% 97% 98%     Physical Exam: Blood pressure 188/60, pulse 73, temperature 98.3 F (36.8 C), temperature source Rectal, resp. rate 18, SpO2 98.00%. Gen: No acute distress. Somnolent. Head: Normocephalic, atraumatic. Eyes: Pupils unequal, anisocoria, EOMI, sclerae nonicteric. Mouth: Oropharynx clear. Mucous membranes moist. Edentulous. Neck: Supple, no thyromegaly, no lymphadenopathy, no jugular venous distention. Chest: Lungs clear to auscultation bilaterally. CV: Heart sounds regular, no murmurs, rubs, or gallops. Abdomen: Soft, nontender, nondistended with normal active bowel sounds. Rectal: Done by the ED PA, positive for gross blood and external hemorrhoids. Extremities: Extremities without clubbing, edema, or cyanosis. She does have prominent petechiae to the right foot. Skin: Warm and dry. Neuro: Alert and oriented times 1; cranial nerves II through XII grossly intact. Psych: Mood and affect normal.  Labs on Admission:  Basic Metabolic Panel:  Recent Labs Lab 12/04/12 1843 12/05/12 0530 12/06/12 0445 12/08/12 1325 12/09/12 0934 12/11/12 1318  NA  --  140 136 139 136 139  K  --  4.4 3.3* 2.5* 4.2 3.5  CL  --  106 105 104 106 105  CO2  --  21 21 27 25 26   GLUCOSE  --  158* 93 115* 126* 75  BUN  --  33* 22 16 17 12   CREATININE  --  1.98* 1.09 0.88 0.86  0.96  CALCIUM  --  7.9* 8.0* 8.4 8.0* 8.3*  MG 1.8  --  1.9 2.0  --   --   PHOS 4.8*  --   --   --   --   --    Liver Function Tests:  Recent Labs Lab 12/11/12 1318  AST 17  ALT 16  ALKPHOS 58  BILITOT 0.3  PROT 5.6*  ALBUMIN 2.5*   CBC:  Recent Labs Lab 12/05/12 0530 12/11/12 1318  WBC 9.9 8.9  NEUTROABS  --  5.9  HGB 11.9* 11.3*  HCT 37.8 35.0*  MCV 91.7 89.5  PLT 264 234    BNP (last 3 results)  Recent Labs  09/03/12 0644 10/08/12 1723 10/12/12 0530  PROBNP 936.0* 1356.0* 8213.0*   Urinalysis    Component Value Date/Time   COLORURINE YELLOW 12/11/2012 1130   APPEARANCEUR CLEAR 12/11/2012 1130   LABSPEC 1.010 12/11/2012 1130   PHURINE 5.5 12/11/2012 1130   GLUCOSEU NEGATIVE 12/11/2012 1130   HGBUR NEGATIVE 12/11/2012 1130   BILIRUBINUR NEGATIVE 12/11/2012 1130   KETONESUR NEGATIVE 12/11/2012 1130   PROTEINUR NEGATIVE 12/11/2012 1130   UROBILINOGEN 0.2 12/11/2012 1130   NITRITE NEGATIVE 12/11/2012 1130   LEUKOCYTESUR NEGATIVE 12/11/2012 1130     Radiological Exams on Admission: Ct Head Wo Contrast  12/11/2012  *RADIOLOGY REPORT*  Clinical Data: Altered mental status.  Weakness.  CT HEAD WITHOUT CONTRAST  Technique:  Contiguous axial images were obtained from the base of the skull through the vertex without contrast.  Comparison: 10/08/2012 and brain MR dated 06/18/2010.  Findings: Again demonstrated is mild linearly increased density in the lateral aspect of the pons on the left and diffuse central pontine low density.  No significant change in diffuse enlargement of the ventricles and subarachnoid spaces and patchy white matter low density in both cerebral hemispheres.  No intracranial hemorrhage, mass lesion or CT evidence of acute infarction. Unremarkable bones and paranasal sinuses.  IMPRESSION:  1.  No acute abnormality. 2.  Stable atrophy, extensive chronic small vessel white matter ischemic changes in  both cerebral hemispheres and extensive chronic small vessel  ischemic changes in the pons.   Original Report Authenticated By: Beckie Salts, M.D.    Dg Chest Portable 1 View  12/11/2012  *RADIOLOGY REPORT*  Clinical Data: Altered mental status  PORTABLE CHEST - 1 VIEW  Comparison: 12/04/2012  Findings: Chronic interstitial markings.  No frank interstitial edema.  Mild patchy left basilar opacity, likely atelectasis.  No pneumothorax.  Cardiomegaly.  IMPRESSION: Cardiomegaly.  No frank interstitial edema.  Mild patchy left basilar opacity, likely atelectasis.   Original Report Authenticated By: Charline Bills, M.D.     EKG: Independently reviewed. Atrial fibrillation with a right bundle branch block and PVCs.  Assessment/Plan Principal Problem:   Sepsis with toxic encephalopathy -Altered mental status most likely from recurrent sepsis. No leukocytosis but recently has had various infections including hospital acquired pneumonia, urinary tract infection, meningitis, and Clostridium difficile. -Urinalysis negative for nitrites and leukocytes. -Continue oral vancomycin for recent Clostridium difficile. -Would avoid empiric antibiotics unless a source is evident. Active Problems:   Hypothyroidism -Continue current dose of Synthroid.   Hyperlipidemia -Continue statin.   Clostridium difficile diarrhea -Continue oral vancomycin.   Atrial fibrillation by electrocardiogram -Rate controlled. Would avoid Coumadin given her overall prognosis and hematochezia.   Hematochezia secondary to bleeding hemorrhoids -Treat with Anusol HC cream 4 times a day.   Failure to thrive -Palliative care consultation for goals of care and hospice eligibility.   Code Status: DNR Family Communication: Angelique Blonder (daugher) [phone]. Disposition Plan: SNF versus residential hospice.  Time spent: One hour.  Bayle Calvo [facility] Pager [phone]  If 7PM-7AM, please contact night-coverage [email or web address] Password [facility] 12/11/2012, 4:08 PM

## 2012-12-12 MED ORDER — ENSURE COMPLETE PO LIQD
237.0000 mL | Freq: Two times a day (BID) | ORAL | Status: DC
  Administered 2012-12-12 – 2012-12-13 (×2): 237 mL via ORAL

## 2012-12-12 NOTE — ED Provider Notes (Signed)
Medical screening examination/treatment/procedure(s) were conducted as a shared visit with non-physician practitioner(s) and myself.  I personally evaluated the patient during the encounter.  Elderly obtunded patient. Recent admission for C. difficile sepsis. Will readmit  Donnetta Hutching, MD 12/12/12 [phone]

## 2012-12-12 NOTE — Progress Notes (Signed)
Patient ZO:[id number] A Ruffino      DOB: 1930/03/15      WUJ:[id number]  Summary of Goals of Care, Full note to follow:   Met with patient's Daughter and with patient:    Daughter requested conversation with her first and was offered opportunity to talk with her mom about her ideas related to her current health situation.   Daughter tearful about ups and downs in mother's health.  At this time she knows that her health is failing but doesn't quite feel that we are at the moment in her mom's life where quantity is outweighing quality.  She knows that if she were to become as sick as she was a few weeks ago with meningitis that she would likely opt for full comfort , but at this time with this small set back still feels that she has quality of life- enjoys her ALF, knitting, the patient identified enjoying her daughter as a quality issue.  Patient has expressed that she is feeling week when she walks which makes it hard, and that she doesn't like some of the food that is being offered.  Her daughter is supportive of encouraging her to walk but not to the point of force and liberalizing her diet.  Daughter is very open to transition back to the ALF with PCS to follow.  If patient were to have a significant decline she would be very open to having hospice assist with her end of life care.  At this time,  I don't see a definitive diagnosis that would qualify her for hospice care.  Her dementia remains functional. Fast score likely 3-4.  PPS 50-60%.  Nutritional support is warranted as her albumin is low and is a significant predictor for  Decline in health.  Recommendations:  1.  PT/ OT eval and treat  2.  Nutrition consult for supplementation of diet, and food preferences  3.  HTN -  Wonder if TIA explains yesterday's sleepiness, since overt sepsis not playing out. Maximize oral therapy.  4.  Possible persistent UTI /Cdiff: don't know if repeat is helpful so close to the last positive result.    5.   MOST form reviewed.  Patient's daughter is going to think about and talk with her mom about long term feeding.  The form is in the shadow chart and can be complete by the PCP if discharge is imminent and daughter is ready to make the last choice.   6.  Would recommend Palliative Care Services to follow at ALF so that next sets of decisions can be made at the appropriate points in time.   Total time :  835-945  Am  Siris Hoos L. Ladona Ridgel, MD MBA The Palliative Medicine Team at [facility] Phone: [phone] Pager: [phone]

## 2012-12-12 NOTE — Progress Notes (Signed)
[facility] PROGRESS NOTE  [name] WUJ:[id number] DOB: August 05, 1930 DOA: 12/11/2012 PCP: Cain Saupe, MD  Assessment/Plan: AMS? Due to Sepsis with toxic encephalopathy vs TIA -Altered mental status most likely from recurrent sepsis. No leukocytosis but recently has had various infections including hospital acquired pneumonia, urinary tract infection, meningitis, and Clostridium difficile.  -Urinalysis negative for nitrites and leukocytes.  -Continue oral vancomycin for recent Clostridium difficile.  -Will avoid empiric antibiotics unless a source is evident.  - no coumadin due to overall poor prognosis and hematochezia (on aggrenox) -PT eval -back to baseline  Hypothyroidism  -Continue current dose of Synthroid.   Hyperlipidemia  -Continue statin.   Clostridium difficile diarrhea  -Continue oral vancomycin.   Atrial fibrillation by electrocardiogram  -Rate controlled. Would avoid Coumadin given her overall prognosis and hematochezia.   Hematochezia secondary to bleeding hemorrhoids  -Treat with Anusol HC cream 4 times a day.   Failure to thrive  -Palliative care consultation for goals of care-   Code Status: DNR Family Communication: patient at bedside Disposition Plan: PT eval- back to ALF?---- palliative care to follow there at D/C   Consultants:  Palliative care  Procedures:  none  Antibiotics:  Oral vanc  HPI/Subjective: Feeling fine, no new c/o  Objective: Filed Vitals:   12/11/12 2135 12/12/12 0008 12/12/12 0615 12/12/12 1031  BP: 179/63 172/52 154/56 138/63  Pulse: 67 72 85 73  Temp: 97.8 F (36.6 C)  98.7 F (37.1 C) 98.5 F (36.9 C)  TempSrc: Oral  Oral Oral  Resp: 16  18 17   Height:      Weight:      SpO2: 97%  95% 97%    Intake/Output Summary (Last 24 hours) at 12/12/12 1322 Last data filed at 12/12/12 1209  Gross per 24 hour  Intake   1566 ml  Output      0 ml  Net   1566 ml   Filed Weights   12/11/12 1714   Weight: 92.534 kg (204 lb)    Exam:   General:  Alert and cooperative  Cardiovascular: irregular  Respiratory: clear anterior  Abdomen: +BS, soft, NT  Musculoskeletal: CN 2-12 grossly intact   Data Reviewed: Basic Metabolic Panel:  Recent Labs Lab 12/06/12 0445 12/08/12 1325 12/09/12 0934 12/11/12 1318  NA 136 139 136 139  K 3.3* 2.5* 4.2 3.5  CL 105 104 106 105  CO2 21 27 25 26   GLUCOSE 93 115* 126* 75  BUN 22 16 17 12   CREATININE 1.09 0.88 0.86 0.96  CALCIUM 8.0* 8.4 8.0* 8.3*  MG 1.9 2.0  --   --    Liver Function Tests:  Recent Labs Lab 12/11/12 1318  AST 17  ALT 16  ALKPHOS 58  BILITOT 0.3  PROT 5.6*  ALBUMIN 2.5*   No results found for this basename: LIPASE, AMYLASE,  in the last 168 hours No results found for this basename: AMMONIA,  in the last 168 hours CBC:  Recent Labs Lab 12/11/12 1318  WBC 8.9  NEUTROABS 5.9  HGB 11.3*  HCT 35.0*  MCV 89.5  PLT 234   Cardiac Enzymes: No results found for this basename: CKTOTAL, CKMB, CKMBINDEX, TROPONINI,  in the last 168 hours BNP (last 3 results)  Recent Labs  09/03/12 0644 10/08/12 1723 10/12/12 0530  PROBNP 936.0* 1356.0* 8213.0*   CBG: No results found for this basename: GLUCAP,  in the last 168 hours  Recent Results (from the past 240 hour(s))  URINE CULTURE  Status: None   Collection Time    12/04/12  6:19 AM      Result Value Range Status   Specimen Description URINE, CLEAN CATCH   Final   Special Requests NONE   Final   Culture  Setup Time 12/04/2012 16:30   Final   Colony Count >=100,000 COLONIES/ML   Final   Culture ESCHERICHIA COLI   Final   Report Status 12/06/2012 FINAL   Final   Organism ID, Bacteria ESCHERICHIA COLI   Final  CSF CULTURE     Status: None   Collection Time    12/04/12  3:17 PM      Result Value Range Status   Specimen Description CSF   Final   Special Requests NONE   Final   Gram Stain     Final   Value: WBC PRESENT, PREDOMINANTLY MONONUCLEAR      NO ORGANISMS SEEN     CYTOSPUN   Culture NO GROWTH 3 DAYS   Final   Report Status 12/08/2012 FINAL   Final  GRAM STAIN     Status: None   Collection Time    12/04/12  3:17 PM      Result Value Range Status   Specimen Description CSF   Final   Special Requests NONE   Final   Gram Stain     Final   Value: CYTOSPIN     NO WBC SEEN     NO ORGANISMS SEEN   Report Status 12/04/2012 FINAL   Final  CULTURE, BLOOD (ROUTINE X 2)     Status: None   Collection Time    12/04/12  6:35 PM      Result Value Range Status   Specimen Description BLOOD RIGHT HAND   Final   Special Requests BOTTLES DRAWN AEROBIC ONLY 2CC   Final   Culture  Setup Time 12/05/2012 00:54   Final   Culture NO GROWTH 5 DAYS   Final   Report Status 12/11/2012 FINAL   Final  CULTURE, BLOOD (ROUTINE X 2)     Status: None   Collection Time    12/04/12  6:40 PM      Result Value Range Status   Specimen Description BLOOD RIGHT ARM   Final   Special Requests BOTTLES DRAWN AEROBIC ONLY 6C   Final   Culture  Setup Time 12/05/2012 00:53   Final   Culture NO GROWTH 5 DAYS   Final   Report Status 12/11/2012 FINAL   Final  MRSA PCR SCREENING     Status: None   Collection Time    12/04/12  8:03 PM      Result Value Range Status   MRSA by PCR NEGATIVE  NEGATIVE Final   Comment:            The GeneXpert MRSA Assay (FDA     approved for NASAL specimens     only), is one component of a     comprehensive MRSA colonization     surveillance program. It is not     intended to diagnose MRSA     infection nor to guide or     monitor treatment for     MRSA infections.  CLOSTRIDIUM DIFFICILE BY PCR     Status: Abnormal   Collection Time    12/04/12 10:53 PM      Result Value Range Status   C difficile by pcr POSITIVE (*) NEGATIVE Final   Comment: CRITICAL RESULT CALLED TO, READ BACK  BY AND VERIFIED WITH:     G [facility] 12/05/12 0905 BY K SCHULTZ  STOOL CULTURE     Status: None   Collection Time    12/04/12 10:54 PM       Result Value Range Status   Specimen Description STOOL   Final   Special Requests Normal   Final   Culture     Final   Value: NO SALMONELLA, SHIGELLA, CAMPYLOBACTER, YERSINIA, OR E.COLI 0157:H7 ISOLATED   Report Status 12/08/2012 FINAL   Final     Studies: Ct Head Wo Contrast  12/11/2012  *RADIOLOGY REPORT*  Clinical Data: Altered mental status.  Weakness.  CT HEAD WITHOUT CONTRAST  Technique:  Contiguous axial images were obtained from the base of the skull through the vertex without contrast.  Comparison: 10/08/2012 and brain MR dated 06/18/2010.  Findings: Again demonstrated is mild linearly increased density in the lateral aspect of the pons on the left and diffuse central pontine low density.  No significant change in diffuse enlargement of the ventricles and subarachnoid spaces and patchy white matter low density in both cerebral hemispheres.  No intracranial hemorrhage, mass lesion or CT evidence of acute infarction. Unremarkable bones and paranasal sinuses.  IMPRESSION:  1.  No acute abnormality. 2.  Stable atrophy, extensive chronic small vessel white matter ischemic changes in both cerebral hemispheres and extensive chronic small vessel ischemic changes in the pons.   Original Report Authenticated By: Beckie Salts, M.D.    Dg Chest Portable 1 View  12/11/2012  *RADIOLOGY REPORT*  Clinical Data: Altered mental status  PORTABLE CHEST - 1 VIEW  Comparison: 12/04/2012  Findings: Chronic interstitial markings.  No frank interstitial edema.  Mild patchy left basilar opacity, likely atelectasis.  No pneumothorax.  Cardiomegaly.  IMPRESSION: Cardiomegaly.  No frank interstitial edema.  Mild patchy left basilar opacity, likely atelectasis.   Original Report Authenticated By: Charline Bills, M.D.     Scheduled Meds: . sodium chloride   Intravenous STAT  . amLODipine  5 mg Oral q morning - 10a  . dipyridamole-aspirin  1 capsule Oral BID  . donepezil  10 mg Oral QHS  . hydrALAZINE  10 mg Oral Q8H   . hydrocortisone   Rectal QID  . levothyroxine  125 mcg Oral QAC breakfast  . memantine  10 mg Oral BID  . multivitamin with minerals  1 tablet Oral Daily  . polycarbophil  625 mg Oral Daily  . potassium chloride SA  20 mEq Oral BID  . [START ON 12/13/2012] predniSONE  10 mg Oral QODAY  . predniSONE  15 mg Oral QODAY  . saccharomyces boulardii  250 mg Oral BID  . sertraline  50 mg Oral Daily  . simvastatin  20 mg Oral QHS  . sodium chloride  500 mL Intravenous Once  . vancomycin  125 mg Oral Q6H   Continuous Infusions: . 0.9 % NaCl with KCl 20 mEq / L 75 mL/hr at 12/12/12 1610    Principal Problem:   Sepsis Active Problems:   Hypothyroidism   Hyperlipidemia   Clostridium difficile diarrhea   Atrial fibrillation by electrocardiogram   Hematochezia secondary to bleeding hemorrhoids   Toxic encephalopathy   Failure to thrive in adult    Time spent: 35    [facility], JESSICA  [facility] Pager [phone]. If 7PM-7AM, please contact night-coverage at [email or web address], password [facility] 12/12/2012, 1:22 PM  LOS: 1 day

## 2012-12-12 NOTE — Progress Notes (Signed)
Nutrition Brief Note  Patient identified on the Malnutrition Screening Tool (MST) Report  Body mass index is 29.27 kg/(m^2). Patient meets criteria for overweight based on current BMI.   Current diet order is regular, patient is consuming approximately 100% of meals at this time. Labs and medications reviewed. Pt reports eating well PTA and drinking Ensure - will order. Per recorded wt history pt has maintained wt at approximately 200 lbs for the past year.   No nutrition interventions warranted at this time other than Ensure. Pt currently eating excellent. If nutrition issues arise, please consult RD.   Levon Hedger MS, RD, LDN [phone] Pager [phone] After Hours Pager

## 2012-12-12 NOTE — Consult Note (Signed)
Patient ZO:[id number] [name]      DOB: 10-Aug-1930      WUJ:[id number]     Consult Note from the Palliative Medicine Team at [facility]    Consult Requested by:  Dr. Darnelle Harrison    PCP: Dr. Cathey Harrison Reason for Consultation: Goals of care    Phone Number:  Assessment of patients Current state: Patient is an 79 year old white female with Karen known past medical history for TIA, mild dementia, cardiomyopathy with hypertension, chronic UTIs, temporal arteritis, and Karen history of peripheral neuropathy. The patient recently had Karen hospital stay for bacterial meningitis and was subsequently discharged back to her facility. The caregivers at the facility could not wake her up on the day of admission so she was sent to the hospital. On my evaluation the patient is awake and alert sitting in her bed feeding herself breakfast. It appears that her symptoms altered mental status have improved. I met with her daughter to discuss goals of care at this time she sees her having chronic rehospitalizations and continued stability which are impacting on her quality of life and dignity. The patient's daughter relates that she is quite sad and seen Karen decline of her mother and is actually experiencing some anticipatory anxiety and depression regarding her mother's ill health. We talked about her current state which may or may not have represented Karen pathological event like Karen TIA since her symptoms resolve so quickly and she is returned back to baseline. At this time, Karen goals for care Comfort feed whatever the patient feels that she would like to eat. Treating the treatable. Transitioning the patient back to her assisted living with palliative care services will forward them the ability to consider when hospice care would be appropriate as they would be open to hospice care at the end of life. We will ask for the patient to have Karen physical therapy evaluation and nutritional consult to try to maximize her eating, and ambulation.  Goals of  Care: 1.  Code Status: DO NOT RESUSCITATE DO NOT INTUBATE   2. Scope of Treatment:: At this time the patient's daughter desires to treat the treatable within the from of illness is that will affect the patient's quality and dignity. She states that she would not likely want to return to the ICU and have Karen full course of treatment the way she did when she had bacterial meningitis. She was ever that sick again she would likely consider comfort care. She would like to give her the opportunity to work with physical therapy and get up from her bed, but has decided that it is not worth fighting over if she is unable to perform that activity to the level at she previously expected. She is accepting the concept of comfort feeding whatever her mom would like to eat. But she did hesitate when he came to trucking about feeding tubes and therefore decided that she wanted to think about this topic. We therefore did not complete Karen full most form at this time.   4. Disposition: Back to an assisted living setting once she is medically stable. Recommend he see [place] to follow so that the patient could transition to hospice care she becomes significantly worse in the near future.   3. Symptom Management: 1. Dementia continue [name]., And [name] 2 hypothyroidism continue [name]  3. History of giant cell arteritis on chronic prednisone. Always consider adding stress to steroids if the patient is acutely ill. 4 depression continue [name] 5. History of TIA continue  [name] Hyperlipidemia continue [name] 6. Recent C. difficile colitis continue oral [name] for Karen standard course of treatment her primary care physician. Hypertension continue [name] and [name].     4. Psychosocial:The patient has had Karen very close relationship with her daughter. She had lived in her home for Karen period of time and so her daughter does have some feelings of guilt about her mother being an assisted living setting but she was able  to verbalize the positive features of allowing her mother to be among people her own age and her own space.  5. Spiritual:The family is reliant on her [name].        Patient Documents Completed or Given: Document Given Completed  Advanced Directives Pkt    MOST    DNR    Gone from My Sight    Hard Choices      Brief HPI: Patient is an 79 year old white female with multiple medical problems including mild dementia, giant cell arteritis on chronic prednisone, recent C. difficile colitis, treated meningitis, and Karen history of TIA. She presented to the hospital with altered mental status which is now resolved. Her asked to initiate Karen preliminary goals of care conversation regarding her declining health.   ROS: Patient relates no nausea, vomiting, diarrhea. She does report generalized weakness in her legs. She states her appetite is okay but she is generally picking at her food    PMH:  Past Medical History  Diagnosis Date  . Peripheral neuropathy   . Hypertension   . TIA (transient ischemic attack)   . Elevated sed rate   . Temporal arteritis     chronic steroids  . Hypothyroidism   . Colon cancer     colon  . History of hernia repair   . S/P rotator cuff surgery     right  . Diastolic CHF   . Cardiomegaly - hypertensive   . Chronic anemia   . Chronic steroid use   . Dementia   . Shortness of breath   . Hyperlipidemia   . CHF (congestive heart failure)   . Cardiomyopathy due to hypertension   . Dependence on supplemental oxygen   . Chronic UTI   . Dementia   . Giant cell arteritis      PSH: Past Surgical History  Procedure Laterality Date  . Tonsillectomy  1939  . Colectomy  1987  . Abdominal hysterectomy  1974  . Appendectomy  1947  . Hernia repair  1990  . Cholecystectomy  2000  . Rotator cuff repair  2004  . Right temporal artery biopsy  10/1998, 10/2009  . Right cataract extraction  2011  . Eye surgery    . Lumbar puncture  10/07/2004   I  have reviewed the FH and SH and  If appropriate update it with new information. Allergies  Allergen Reactions  . Apap (Acetaminophen)     Unknown  . Ciprofloxacin Nausea And Vomiting  . Codeine Other (See Comments)    halucinations  . Demerol Other (See Comments)    halucinations  . Morphine And Related Other (See Comments)    halucinations  . Nitrofurantoin Monohyd Macro Other (See Comments)    Per MAR  . Pregabalin Other (See Comments)    unknown  . Septra (Bactrim) Other (See Comments)    Unknown cant tolerate DS   Scheduled Meds: . sodium chloride   Intravenous STAT  . amLODipine  5 mg Oral q morning - 10a  . dipyridamole-aspirin  1  capsule Oral BID  . donepezil  10 mg Oral QHS  . [name]  10 mg Oral Q8H  . hydrocortisone   Rectal QID  . levothyroxine  125 mcg Oral QAC breakfast  . memantine  10 mg Oral BID  . multivitamin with minerals  1 tablet Oral Daily  . polycarbophil  625 mg Oral Daily  . potassium chloride SA  20 mEq Oral BID  . [START ON 12/13/2012] predniSONE  10 mg Oral QODAY  . predniSONE  15 mg Oral QODAY  . saccharomyces boulardii  250 mg Oral BID  . sertraline  50 mg Oral Daily  . simvastatin  20 mg Oral QHS  . sodium chloride  500 mL Intravenous Once  . [name]  125 mg Oral Q6H   Continuous Infusions: . 0.9 % NaCl with KCl 20 mEq / L 75 mL/hr at 12/12/12 0618   PRN Meds:.ondansetron (ZOFRAN) IV, ondansetron    BP 154/56  Pulse 85  Temp(Src) 98.7 F (37.1 C) (Oral)  Resp 18  Ht 5\' 10"  (1.778 m)  Wt 92.534 kg (204 lb)  BMI 29.27 kg/m2  SpO2 95%   PPS: 40%   Intake/Output Summary (Last 24 hours) at 12/12/12 0954 Last data filed at 12/12/12 1610  Gross per 24 hour  Intake   1206 ml  Output      0 ml  Net   1206 ml   LBM:          She is 16         Stool Softner: None  Physical Exam:  General: Generally in no acute distress slightly confabulatory but functional with her mild dementia HEENT:  Normocephalic atraumatic pupils  equal reactive to light extra ocular muscles appeared intact his membranes are moist Chest:   Decreased but clear to auscultation his blockers or wheezes CVS: Regular rate and rhythm positive S1 and S2 no S3-S4 no murmurs or gallops Abdomen: Obese soft nontender nondistended with positive bowel sounds no hepatosplenomegaly Ext:  Warm no edema no lesions Neuro: Awake alert oriented cranial nerves II through XII appear to be intact power appears to be 5 out of 5 she is generally functionally conversant but does have some short-term memory deficits that she covers while  Labs: CBC    Component Value Date/Time   WBC 8.9 12/11/2012 1318   RBC 3.91 12/11/2012 1318   HGB 11.3* 12/11/2012 1318   HCT 35.0* 12/11/2012 1318   PLT 234 12/11/2012 1318   MCV 89.5 12/11/2012 1318   MCH 28.9 12/11/2012 1318   MCHC 32.3 12/11/2012 1318   RDW 14.5 12/11/2012 1318   LYMPHSABS 2.4 12/11/2012 1318   MONOABS 0.4 12/11/2012 1318   EOSABS 0.2 12/11/2012 1318   BASOSABS 0.0 12/11/2012 1318      CMP     Component Value Date/Time   NA 139 12/11/2012 1318   K 3.5 12/11/2012 1318   CL 105 12/11/2012 1318   CO2 26 12/11/2012 1318   GLUCOSE 75 12/11/2012 1318   BUN 12 12/11/2012 1318   CREATININE 0.96 12/11/2012 1318   CALCIUM 8.3* 12/11/2012 1318   PROT 5.6* 12/11/2012 1318   ALBUMIN 2.5* 12/11/2012 1318   AST 17 12/11/2012 1318   ALT 16 12/11/2012 1318   ALKPHOS 58 12/11/2012 1318   BILITOT 0.3 12/11/2012 1318   GFRNONAA 54* 12/11/2012 1318   GFRAA 63* 12/11/2012 1318    Chest Xray Reviewed/Impressions: Left basilar opacity consistent with atelectasis no edema  CT scan of  the Head Reviewed/Impressions: Chronic small vessel disease specifically in the pons but no acute intracranial hemorrhage.    Time In Time Out Total Time Spent with Patient Total Overall Time  8:35 AM   9:45 AM   30 minutes   70 minutes     Greater than 50%  of this time was spent counseling and coordinating care related to the above assessment  and plan.  Discuss with Dr. Yisroel Ramming L. Ladona Ridgel, MD MBA The Palliative Medicine Team at [facility] Phone: [phone] Pager: [phone]

## 2012-12-12 NOTE — Care Management (Signed)
CARE MANAGEMENT NOTE 12/12/2012  Patient:  [name], [name]   Account Number:  [id number]  Date Initiated:  12/12/2012  Documentation initiated by:  Domonic Hiscox  Subjective/Objective Assessment:   79 yo female admitted with AMS related to sepsis. PTA pt lived at ALF.     Action/Plan:   ALF s SNF   Anticipated DC Date:  12/13/2012   Anticipated DC Plan:  ASSISTED LIVING / REST HOME  In-house referral  Clinical Social Worker      DC Planning Services  CM consult      Choice offered to / List presented to:  NA           Status of service:  Completed, signed off Medicare Important Message given?   (If response is "NO", the following Medicare IM given date fields will be blank) Date Medicare IM given:   Date Additional Medicare IM given:    Discharge Disposition:    Per UR Regulation:  Reviewed for med. necessity/level of care/duration of stay  If discussed at Long Length of Stay Meetings, dates discussed:    Comments:  12/12/12 1612 Vadie Principato,RN,BSN [phone]

## 2012-12-13 MED ORDER — ENSURE COMPLETE PO LIQD: 237.0000 mL | Freq: Two times a day (BID) | ORAL | Status: DC

## 2012-12-13 MED ORDER — HYDROCOD POLST-CHLORPHEN POLST 10-8 MG/5ML PO LQCR
5.0000 mL | Freq: Once | ORAL | Status: DC
  Filled 2012-12-13: qty 5

## 2012-12-13 NOTE — Progress Notes (Signed)
Clinical Social Work Department BRIEF PSYCHOSOCIAL ASSESSMENT 12/13/2012  Patient:  [name], [name]     Account Number:  [id number]     Admit date:  12/11/2012  Clinical Social Worker:  Jacelyn Grip  Date/Time:  12/13/2012 11:00 AM  Referred by:  Physician  Date Referred:  12/13/2012 Referred for  ALF Placement   Other Referral:   Interview type:  Patient Other interview type:   and patient daughter, [name]    PSYCHOSOCIAL DATA Living Status:  FACILITY Admitted from facility:   Level of care:  Assisted Living Primary support name:  [name]/daughter Primary support relationship to patient:  CHILD, ADULT Degree of support available:   strong    CURRENT CONCERNS Current Concerns  Post-Acute Placement   Other Concerns:    SOCIAL WORK ASSESSMENT / PLAN CSW received notification that pt admitted from [facility].    CSW met with pt and pt daughter at bedside re: disposition planning.    Plan is to return to [facility] with [facility] services.    CSW contacted facility and awaiting return phone call from facility resident care coordinator.    CSW to facilitate pt discharge needs when pt medically ready for discharge.   Assessment/plan status:  Psychosocial Support/Ongoing Assessment of Needs Other assessment/ plan:   discharge planning   Information/referral to community resources:   Referral back to [facility]F    PATIENT'S/FAMILY'S RESPONSE TO PLAN OF CARE: Pt alert and oriented x 4. Pt and pt daughter plan for pt to return to [facility] as pt has been there since July and they are satisfied with pt care at facility.        [name], MSW, LCSWA  Clinical Social Work [phone]

## 2012-12-13 NOTE — Discharge Summary (Signed)
Physician Discharge Summary  [name] XBJ:[id number] DOB: 1930/01/02 DOA: 12/11/2012  PCP: Cain Saupe, MD  Admit date: 12/11/2012 Discharge date: 12/13/2012  Time spent: 35 minutes  Recommendations for Outpatient Follow-up:  1. Home health PT/OT 2. TED hose  Discharge Diagnoses:  Principal Problem:   Sepsis Active Problems:   Hypothyroidism   Hyperlipidemia   Clostridium difficile diarrhea   Atrial fibrillation by electrocardiogram   Hematochezia secondary to bleeding hemorrhoids   Toxic encephalopathy   Failure to thrive in adult   Discharge Condition: improved  Diet recommendation: regular  Filed Weights   12/11/12 1714  Weight: 92.534 kg (204 lb)    History of present illness:  [name] is an 79 y.o. female with a PMH of recent hospitalization 12/04/12-12/09/12 where she was treated for sepsis secondary to E. Coli UTI and C. Difficile colitis. She was ultimately discharged to the [facility] on Septra and oral Vancomycin. She was recuperating well up until this morning, when she had an abrupt change in her mental status. She became somnolent and difficult to wake up, but no frank confusion or disorientation. She has been a bit weak and her daughter, who is at the bedside, says the only complaints she has had was that she felt "cold", and that her right great toe was hurting. No history of gout. States that she "just feels bad" and weak and sleepy. Appetite has been normal up until today. No aggravating or alleviating factors. Noted to have anisocoria by the emergency department PA, the daughter confirms that this is long-standing and related to prior cataract surgery. The patient has had 4 hospitalizations in the past 6 months and is failing to thrive and therefore the daughter has raised the possibility of hospice care.   Hospital Course:  AMS? Due to Sepsis with toxic encephalopathy vs TIA  -Altered mental status most likely from recurrent sepsis. No  leukocytosis but recently has had various infections including hospital acquired pneumonia, urinary tract infection, meningitis, and Clostridium difficile.  -Urinalysis negative for nitrites and leukocytes.  -Continue oral vancomycin for recent Clostridium difficile- finished course on SAturday - no coumadin due to overall poor prognosis and hematochezia (on aggrenox)  -PT eval - recommends HH PT/OT -back to baseline   Hypothyroidism  -Continue current dose of Synthroid.   Hyperlipidemia  -Continue statin.   Clostridium difficile diarrhea  -Continue oral vancomycin.   Atrial fibrillation by electrocardiogram  -Rate controlled. Would avoid Coumadin given her overall prognosis and hematochezia.   Hematochezia secondary to bleeding hemorrhoids  -Treat with Anusol HC cream PRN  Failure to thrive  -Palliative care consultation for goals of care- MOST form on chart/DNR   Procedures:  none  Consultations:  Palliative care  Discharge Exam: Filed Vitals:   12/12/12 1031 12/12/12 1406 12/12/12 2201 12/13/12 0631  BP: 138/63 142/56 147/63 176/70  Pulse: 73 83 75 96  Temp: 98.5 F (36.9 C) 98.8 F (37.1 C) 98.6 F (37 C) 98.2 F (36.8 C)  TempSrc: Oral Oral Oral Oral  Resp: 17 16 18 16   Height:      Weight:      SpO2: 97% 97% 99% 100%    General: A+Ox3,NAD Cardiovascular: irregular Respiratory: clear  Discharge Instructions      Discharge Orders   Future Orders Complete By Expires     Diet general  As directed     Discharge instructions  As directed     Comments:      Palliative care  to follow at ALF Home health PT/OT    Discharge instructions  As directed     Comments:      TED hose    Increase activity slowly  As directed         Medication List    TAKE these medications       acetaminophen 325 MG tablet  Commonly known as:  TYLENOL  Take 650 mg by mouth every 6 (six) hours as needed. For pain     amLODipine 5 MG tablet  Commonly known as:   NORVASC  Take 5 mg by mouth every morning.     Cranberry 200 MG Caps  Take 500 mg by mouth 2 (two) times daily.     dipyridamole-aspirin 200-25 MG per 12 hr capsule  Commonly known as:  AGGRENOX  Take 1 capsule by mouth 2 (two) times daily.     donepezil 10 MG tablet  Commonly known as:  ARICEPT  Take 10 mg by mouth at bedtime.     feeding supplement Liqd  Take 237 mLs by mouth 2 (two) times daily between meals.     hydrALAZINE 10 MG tablet  Commonly known as:  APRESOLINE  Take 1 tablet (10 mg total) by mouth every 8 (eight) hours.     levothyroxine 125 MCG tablet  Commonly known as:  SYNTHROID  Take 1 tablet (125 mcg total) by mouth daily.     loperamide 2 MG capsule  Commonly known as:  IMODIUM  Take 1 capsule (2 mg total) by mouth as needed for diarrhea or loose stools.     memantine 10 MG tablet  Commonly known as:  NAMENDA  Take 1 tablet (10 mg total) by mouth 2 (two) times daily.     multivitamin with minerals Tabs  Take 1 tablet by mouth daily.     omeprazole 20 MG capsule  Commonly known as:  PRILOSEC  Take 20 mg by mouth daily.     polycarbophil 625 MG tablet  Commonly known as:  FIBERCON  Take 625 mg by mouth daily.     potassium chloride SA 20 MEQ tablet  Commonly known as:  K-DUR,KLOR-CON  Take 20 mEq by mouth 2 (two) times daily.     predniSONE 5 MG tablet  Commonly known as:  DELTASONE  Take 10-15 mg by mouth daily. Alternate 10mg  and 15 mg doses     saccharomyces boulardii 250 MG capsule  Commonly known as:  FLORASTOR  Take 250 mg by mouth 2 (two) times daily.     sertraline 50 MG tablet  Commonly known as:  ZOLOFT  Take 50 mg by mouth daily.     simvastatin 20 MG tablet  Commonly known as:  ZOCOR  Take 20 mg by mouth at bedtime.     sulfamethoxazole-trimethoprim 400-80 MG per tablet  Commonly known as:  BACTRIM,SEPTRA  Take 1 tablet by mouth at bedtime.     vancomycin 50 mg/mL oral solution  Commonly known as:  VANCOCIN  Take 2.5  mLs (125 mg total) by mouth every 6 (six) hours.       Follow-up Information   Follow up with FULP, CAMMIE, MD In 1 week.   Contact information:   [address] [place], [place] [address] [place] [address] [phone]        The results of significant diagnostics from this hospitalization (including imaging, microbiology, ancillary and laboratory) are listed below for reference.    Significant Diagnostic Studies: Ct Head Wo Contrast  12/11/2012  *RADIOLOGY REPORT*  Clinical Data: Altered mental status.  Weakness.  CT HEAD WITHOUT CONTRAST  Technique:  Contiguous axial images were obtained from the base of the skull through the vertex without contrast.  Comparison: 10/08/2012 and brain MR dated 06/18/2010.  Findings: Again demonstrated is mild linearly increased density in the lateral aspect of the pons on the left and diffuse central pontine low density.  No significant change in diffuse enlargement of the ventricles and subarachnoid spaces and patchy white matter low density in both cerebral hemispheres.  No intracranial hemorrhage, mass lesion or CT evidence of acute infarction. Unremarkable bones and paranasal sinuses.  IMPRESSION:  1.  No acute abnormality. 2.  Stable atrophy, extensive chronic small vessel white matter ischemic changes in both cerebral hemispheres and extensive chronic small vessel ischemic changes in the pons.   Original Report Authenticated By: Beckie Salts, M.D.    Dg Chest Portable 1 View  12/11/2012  *RADIOLOGY REPORT*  Clinical Data: Altered mental status  PORTABLE CHEST - 1 VIEW  Comparison: 12/04/2012  Findings: Chronic interstitial markings.  No frank interstitial edema.  Mild patchy left basilar opacity, likely atelectasis.  No pneumothorax.  Cardiomegaly.  IMPRESSION: Cardiomegaly.  No frank interstitial edema.  Mild patchy left basilar opacity, likely atelectasis.   Original Report Authenticated By: Charline Bills, M.D.    Dg Chest Port 1 View  12/04/2012   *RADIOLOGY REPORT*  Clinical Data: Shortness of breath.  Congestive heart failure.  PORTABLE CHEST - 1 VIEW  Comparison: 10/08/2012  Findings: Cardiomegaly remains stable.  Mild pleural thickening in the left costophrenic sulcus is unchanged.  No evidence of acute infiltrate or edema.  IMPRESSION: Stable cardiomegaly.  No acute findings.   Original Report Authenticated By: Myles Rosenthal, M.D.     Microbiology: Recent Results (from the past 240 hour(s))  URINE CULTURE     Status: None   Collection Time    12/04/12  6:19 AM      Result Value Range Status   Specimen Description URINE, CLEAN CATCH   Final   Special Requests NONE   Final   Culture  Setup Time 12/04/2012 16:30   Final   Colony Count >=100,000 COLONIES/ML   Final   Culture ESCHERICHIA COLI   Final   Report Status 12/06/2012 FINAL   Final   Organism ID, Bacteria ESCHERICHIA COLI   Final  CSF CULTURE     Status: None   Collection Time    12/04/12  3:17 PM      Result Value Range Status   Specimen Description CSF   Final   Special Requests NONE   Final   Gram Stain     Final   Value: WBC PRESENT, PREDOMINANTLY MONONUCLEAR     NO ORGANISMS SEEN     CYTOSPUN   Culture NO GROWTH 3 DAYS   Final   Report Status 12/08/2012 FINAL   Final  GRAM STAIN     Status: None   Collection Time    12/04/12  3:17 PM      Result Value Range Status   Specimen Description CSF   Final   Special Requests NONE   Final   Gram Stain     Final   Value: CYTOSPIN     NO WBC SEEN     NO ORGANISMS SEEN   Report Status 12/04/2012 FINAL   Final  CULTURE, BLOOD (ROUTINE X 2)     Status: None   Collection Time  12/04/12  6:35 PM      Result Value Range Status   Specimen Description BLOOD RIGHT HAND   Final   Special Requests BOTTLES DRAWN AEROBIC ONLY 2CC   Final   Culture  Setup Time 12/05/2012 00:54   Final   Culture NO GROWTH 5 DAYS   Final   Report Status 12/11/2012 FINAL   Final  CULTURE, BLOOD (ROUTINE X 2)     Status: None   Collection Time     12/04/12  6:40 PM      Result Value Range Status   Specimen Description BLOOD RIGHT ARM   Final   Special Requests BOTTLES DRAWN AEROBIC ONLY 6C   Final   Culture  Setup Time 12/05/2012 00:53   Final   Culture NO GROWTH 5 DAYS   Final   Report Status 12/11/2012 FINAL   Final  MRSA PCR SCREENING     Status: None   Collection Time    12/04/12  8:03 PM      Result Value Range Status   MRSA by PCR NEGATIVE  NEGATIVE Final   Comment:            The GeneXpert MRSA Assay (FDA     approved for NASAL specimens     only), is one component of a     comprehensive MRSA colonization     surveillance program. It is not     intended to diagnose MRSA     infection nor to guide or     monitor treatment for     MRSA infections.  CLOSTRIDIUM DIFFICILE BY PCR     Status: Abnormal   Collection Time    12/04/12 10:53 PM      Result Value Range Status   C difficile by pcr POSITIVE (*) NEGATIVE Final   Comment: CRITICAL RESULT CALLED TO, READ BACK BY AND VERIFIED WITH:     [facility] 12/05/12 0905 BY K SCHULTZ  STOOL CULTURE     Status: None   Collection Time    12/04/12 10:54 PM      Result Value Range Status   Specimen Description STOOL   Final   Special Requests Normal   Final   Culture     Final   Value: NO SALMONELLA, SHIGELLA, CAMPYLOBACTER, YERSINIA, OR E.COLI 0157:H7 ISOLATED   Report Status 12/08/2012 FINAL   Final     Labs: Basic Metabolic Panel:  Recent Labs Lab 12/08/12 1325 12/09/12 0934 12/11/12 1318  NA 139 136 139  K 2.5* 4.2 3.5  CL 104 106 105  CO2 27 25 26   GLUCOSE 115* 126* 75  BUN 16 17 12   CREATININE 0.88 0.86 0.96  CALCIUM 8.4 8.0* 8.3*  MG 2.0  --   --    Liver Function Tests:  Recent Labs Lab 12/11/12 1318  AST 17  ALT 16  ALKPHOS 58  BILITOT 0.3  PROT 5.6*  ALBUMIN 2.5*   No results found for this basename: LIPASE, AMYLASE,  in the last 168 hours No results found for this basename: AMMONIA,  in the last 168 hours CBC:  Recent Labs Lab  12/11/12 1318  WBC 8.9  NEUTROABS 5.9  HGB 11.3*  HCT 35.0*  MCV 89.5  PLT 234   Cardiac Enzymes: No results found for this basename: CKTOTAL, CKMB, CKMBINDEX, TROPONINI,  in the last 168 hours BNP: BNP (last 3 results)  Recent Labs  09/03/12 0644 10/08/12 1723 10/12/12 0530  PROBNP 936.0* 1356.0* 8213.0*  CBG: No results found for this basename: GLUCAP,  in the last 168 hours     Signed:  Benjamine Mola, Nelly Scriven  [facility] 12/13/2012, 1:25 PM

## 2012-12-13 NOTE — Evaluation (Signed)
Physical Therapy Evaluation Patient Details Name: [name] MRN: [id number] DOB: 1930/04/17 Today's Date: 12/13/2012 Time: 1016-1040 PT Time Calculation (min): 24 min  PT Assessment / Plan / Recommendation Clinical Impression  [name] is an 79 y.o. female with a PMH of recent hospitalization 12/04/12-12/09/12 where she was treated for sepsis secondary to E. Coli UTI and C. Difficile colitis.   She was recuperating well up until this morning, when she had an abrupt change in her mental status; Pt would benefit from STSNF vs HHPT at ALF. We will follow her acutely    PT Assessment  Patient needs continued PT services    Follow Up Recommendations  Home health PT;Supervision for mobility/OOB    Does the patient have the potential to tolerate intense rehabilitation      Barriers to Discharge        Equipment Recommendations  None recommended by PT    Recommendations for Other Services     Frequency Min 3X/week    Precautions / Restrictions Precautions Precautions: Fall Precaution Comments: incontinent of urine Restrictions Weight Bearing Restrictions: No   Pertinent Vitals/Pain No c/o pain     Mobility  Bed Mobility Bed Mobility: Supine to Sit Supine to Sit: 4: Min assist Transfers Transfers: Sit to Stand;Stand to Sit;Stand Pivot Transfers Sit to Stand: 3: Mod assist;From bed Stand to Sit: 3: Mod assist;To chair/3-in-1 Details for Transfer Assistance: assist for wt shift     Exercises     PT Diagnosis: Difficulty walking;Generalized weakness  PT Problem List: Decreased mobility;Decreased balance;Decreased activity tolerance PT Treatment Interventions: Gait training;Functional mobility training;Therapeutic activities;Therapeutic exercise;Patient/family education   PT Goals Acute Rehab PT Goals PT Goal Formulation: With patient Time For Goal Achievement: 12/28/12 Potential to Achieve Goals: Good Pt will go Supine/Side to Sit: with modified  independence PT Goal: Supine/Side to Sit - Progress: Goal set today Pt will go Sit to Stand: with min assist PT Goal: Sit to Stand - Progress: Goal set today Pt will Ambulate: 16 - 50 feet;with min assist;with rolling walker PT Goal: Ambulate - Progress: Goal set today Pt will Perform Home Exercise Program: with supervision, verbal cues required/provided PT Goal: Perform Home Exercise Program - Progress: Goal set today  Visit Information  Last PT Received On: 12/13/12 Assistance Needed:  (if more than transfers)    Subjective Data  Subjective: I get around Patient Stated Goal: to walk   Prior Functioning  Home Living Type of Home: Assisted living Home Adaptive Equipment: Walker - rolling;Wheelchair - manual Additional Comments: used RW for short distances, WC for longer distances Prior Function Level of Independence: Needs assistance Needs Assistance: Bathing;Dressing;Gait Gait Assistance: assist of 1 with RW for short distances  Communication Communication: No difficulties    Cognition  Cognition Overall Cognitive Status: Appears within functional limits for tasks assessed/performed Arousal/Alertness: Awake/alert Orientation Level: Appears intact for tasks assessed Behavior During Session: [facility] for tasks performed    Extremity/Trunk Assessment Right Upper Extremity Assessment RUE ROM/Strength/Tone: [facility] for tasks assessed Left Upper Extremity Assessment LUE ROM/Strength/Tone: WFL for tasks assessed Right Lower Extremity Assessment RLE ROM/Strength/Tone: WFL for tasks assessed (LEs fatigue quickly) Left Lower Extremity Assessment LLE ROM/Strength/Tone: WFL for tasks assessed   Balance Dynamic Standing Balance Dynamic Standing - Balance Support: Bilateral upper extremity supported Dynamic Standing - Level of Assistance: 4: Min assist;3: Mod assist  End of Session PT - End of Session Equipment Utilized During Treatment: Gait belt;Oxygen Activity Tolerance: Patient  tolerated treatment well Patient left: in  chair;with call bell/phone within reach Nurse Communication: Mobility status  GP     [facility] 12/13/2012, 11:56 AM

## 2012-12-13 NOTE — Progress Notes (Signed)
Pt for discharge back to [facility].  CSW faxed pt discharge information and confirmed facility received and reviewed information.  CSW discussed with pt and pt daughter and pt daughter plans to transport pt via private vehicle  CSW provided pt discharge packet at bedside.  No further social work needs identified at this time.  CSW signing off.   Jacklynn Lewis, MSW, LCSWA  Clinical Social Work [phone]

## 2012-12-13 NOTE — Progress Notes (Signed)
Patient discharged back to [facility] via wheelchair with daughter, report called to [facility].

## 2012-12-13 NOTE — Care Management (Signed)
Pt active with [facility] agency as verified by CSW through pt's residential facility. [facility] on-site rep Cleda Mccreedy contacted by CM at [phone]. Cm to notify MD of resumption of required resumption of care order.   Roxy Manns Analisa Sledd,RN,BSN [phone]

## 2012-12-18 NOTE — ED Provider Notes (Signed)
Procedure note for Lumbar Puncture, ER Visit 12/04/12:  Patient placed in the left lateral decubitus position and landmarks were identified. Back was cleaned with Betadine and sterile towels were draped over the patient. Overlying skin at the L4/L5 disc interspace was anesthetized with 1% lidocaine. A 3-1/2 inch 20-gauge spinal needle was then advanced into the skin using standard technique. I was unable to find the interdiscal space. Attempts were then initiated at 1 disc space higher, L3/L4. Skin was once again anesthetized and then the needle was advanced into the disc space and opening pressure was 12. 4 mL of clear CSF was obtained in the usual manner. Needle was removed and pressure held over the needle site. He was then dressed with a clean dressing and patient was maintained in a supine position for one hour after procedure. Standard studies were sent. Patient tolerated the procedure fine. There were no complications.  Gilda Crease, MD 12/18/12 1113

## 2013-01-23 ENCOUNTER — Observation Stay (HOSPITAL_COMMUNITY)
Admission: EM | Admit: 2013-01-23 | Discharge: 2013-01-24 | Disposition: A | Discharge status: Skilled Nursing Facility | Payer: Medicare Other | Attending: Internal Medicine | Admitting: Internal Medicine

## 2013-01-23 ENCOUNTER — Emergency Department (HOSPITAL_COMMUNITY): Payer: Medicare Other

## 2013-01-23 ENCOUNTER — Encounter (HOSPITAL_COMMUNITY): Payer: Self-pay | Admitting: Nurse Practitioner

## 2013-01-23 DIAGNOSIS — I5030 Unspecified diastolic (congestive) heart failure: Secondary | ICD-10-CM

## 2013-01-23 DIAGNOSIS — J9600 Acute respiratory failure, unspecified whether with hypoxia or hypercapnia: Secondary | ICD-10-CM

## 2013-01-23 DIAGNOSIS — E8770 Fluid overload, unspecified: Secondary | ICD-10-CM | POA: Diagnosis present

## 2013-01-23 DIAGNOSIS — G009 Bacterial meningitis, unspecified: Secondary | ICD-10-CM

## 2013-01-23 DIAGNOSIS — I4891 Unspecified atrial fibrillation: Secondary | ICD-10-CM

## 2013-01-23 DIAGNOSIS — I5032 Chronic diastolic (congestive) heart failure: Secondary | ICD-10-CM

## 2013-01-23 DIAGNOSIS — R001 Bradycardia, unspecified: Secondary | ICD-10-CM

## 2013-01-23 DIAGNOSIS — D72829 Elevated white blood cell count, unspecified: Secondary | ICD-10-CM

## 2013-01-23 DIAGNOSIS — R197 Diarrhea, unspecified: Secondary | ICD-10-CM | POA: Insufficient documentation

## 2013-01-23 DIAGNOSIS — Z9889 Other specified postprocedural states: Secondary | ICD-10-CM

## 2013-01-23 DIAGNOSIS — E039 Hypothyroidism, unspecified: Secondary | ICD-10-CM | POA: Diagnosis present

## 2013-01-23 DIAGNOSIS — R079 Chest pain, unspecified: Secondary | ICD-10-CM

## 2013-01-23 DIAGNOSIS — R627 Adult failure to thrive: Secondary | ICD-10-CM | POA: Diagnosis present

## 2013-01-23 DIAGNOSIS — Z85038 Personal history of other malignant neoplasm of large intestine: Secondary | ICD-10-CM | POA: Insufficient documentation

## 2013-01-23 DIAGNOSIS — I248 Other forms of acute ischemic heart disease: Secondary | ICD-10-CM

## 2013-01-23 DIAGNOSIS — D649 Anemia, unspecified: Secondary | ICD-10-CM

## 2013-01-23 DIAGNOSIS — A419 Sepsis, unspecified organism: Secondary | ICD-10-CM

## 2013-01-23 DIAGNOSIS — G459 Transient cerebral ischemic attack, unspecified: Secondary | ICD-10-CM

## 2013-01-23 DIAGNOSIS — R112 Nausea with vomiting, unspecified: Secondary | ICD-10-CM

## 2013-01-23 DIAGNOSIS — R600 Localized edema: Secondary | ICD-10-CM

## 2013-01-23 DIAGNOSIS — N179 Acute kidney failure, unspecified: Secondary | ICD-10-CM

## 2013-01-23 DIAGNOSIS — A0472 Enterocolitis due to Clostridium difficile, not specified as recurrent: Secondary | ICD-10-CM | POA: Diagnosis present

## 2013-01-23 DIAGNOSIS — Z8719 Personal history of other diseases of the digestive system: Secondary | ICD-10-CM

## 2013-01-23 DIAGNOSIS — G609 Hereditary and idiopathic neuropathy, unspecified: Secondary | ICD-10-CM | POA: Insufficient documentation

## 2013-01-23 DIAGNOSIS — E860 Dehydration: Secondary | ICD-10-CM

## 2013-01-23 DIAGNOSIS — R0603 Acute respiratory distress: Secondary | ICD-10-CM

## 2013-01-23 DIAGNOSIS — I10 Essential (primary) hypertension: Secondary | ICD-10-CM

## 2013-01-23 DIAGNOSIS — R0902 Hypoxemia: Secondary | ICD-10-CM

## 2013-01-23 DIAGNOSIS — F329 Major depressive disorder, single episode, unspecified: Secondary | ICD-10-CM

## 2013-01-23 DIAGNOSIS — I110 Hypertensive heart disease with heart failure: Principal | ICD-10-CM | POA: Insufficient documentation

## 2013-01-23 DIAGNOSIS — K921 Melena: Secondary | ICD-10-CM

## 2013-01-23 DIAGNOSIS — Z79899 Other long term (current) drug therapy: Secondary | ICD-10-CM | POA: Insufficient documentation

## 2013-01-23 DIAGNOSIS — R4182 Altered mental status, unspecified: Secondary | ICD-10-CM

## 2013-01-23 DIAGNOSIS — E876 Hypokalemia: Secondary | ICD-10-CM

## 2013-01-23 DIAGNOSIS — B9620 Unspecified Escherichia coli [E. coli] as the cause of diseases classified elsewhere: Secondary | ICD-10-CM

## 2013-01-23 DIAGNOSIS — N390 Urinary tract infection, site not specified: Secondary | ICD-10-CM

## 2013-01-23 DIAGNOSIS — I5033 Acute on chronic diastolic (congestive) heart failure: Secondary | ICD-10-CM | POA: Diagnosis present

## 2013-01-23 DIAGNOSIS — IMO0002 Reserved for concepts with insufficient information to code with codable children: Secondary | ICD-10-CM

## 2013-01-23 DIAGNOSIS — M316 Other giant cell arteritis: Secondary | ICD-10-CM

## 2013-01-23 DIAGNOSIS — G92 Toxic encephalopathy: Secondary | ICD-10-CM

## 2013-01-23 DIAGNOSIS — R531 Weakness: Secondary | ICD-10-CM

## 2013-01-23 DIAGNOSIS — Z9981 Dependence on supplemental oxygen: Secondary | ICD-10-CM

## 2013-01-23 DIAGNOSIS — E785 Hyperlipidemia, unspecified: Secondary | ICD-10-CM | POA: Insufficient documentation

## 2013-01-23 DIAGNOSIS — J189 Pneumonia, unspecified organism: Secondary | ICD-10-CM

## 2013-01-23 DIAGNOSIS — F0390 Unspecified dementia without behavioral disturbance: Secondary | ICD-10-CM | POA: Diagnosis present

## 2013-01-23 DIAGNOSIS — C189 Malignant neoplasm of colon, unspecified: Secondary | ICD-10-CM

## 2013-01-23 DIAGNOSIS — G629 Polyneuropathy, unspecified: Secondary | ICD-10-CM

## 2013-01-23 DIAGNOSIS — I119 Hypertensive heart disease without heart failure: Secondary | ICD-10-CM | POA: Diagnosis present

## 2013-01-23 DIAGNOSIS — I509 Heart failure, unspecified: Secondary | ICD-10-CM | POA: Diagnosis present

## 2013-01-23 LAB — CBC WITH DIFFERENTIAL/PLATELET
Basophils Absolute: 0 10*3/uL (ref 0.0–0.1)
Basophils Relative: 0 % (ref 0–1)
Eosinophils Absolute: 0.1 10*3/uL (ref 0.0–0.7)
Eosinophils Relative: 1 % (ref 0–5)
Lymphs Abs: 1.6 10*3/uL (ref 0.7–4.0)
MCH: 28.1 pg (ref 26.0–34.0)
MCHC: 31.3 g/dL (ref 30.0–36.0)
MCV: 89.9 fL (ref 78.0–100.0)
Platelets: 275 10*3/uL (ref 150–400)
RDW: 14.5 % (ref 11.5–15.5)

## 2013-01-23 LAB — URINALYSIS, ROUTINE W REFLEX MICROSCOPIC
Hgb urine dipstick: NEGATIVE
Nitrite: NEGATIVE
Specific Gravity, Urine: 1.009 (ref 1.005–1.030)
Urobilinogen, UA: 0.2 mg/dL (ref 0.0–1.0)

## 2013-01-23 LAB — CBC
Hemoglobin: 9.2 g/dL — ABNORMAL LOW (ref 12.0–15.0)
MCHC: 29.9 g/dL — ABNORMAL LOW (ref 30.0–36.0)
RBC: 3.48 MIL/uL — ABNORMAL LOW (ref 3.87–5.11)

## 2013-01-23 LAB — COMPREHENSIVE METABOLIC PANEL
ALT: 10 U/L (ref 0–35)
Albumin: 2.6 g/dL — ABNORMAL LOW (ref 3.5–5.2)
Calcium: 8.8 mg/dL (ref 8.4–10.5)
GFR calc Af Amer: 64 mL/min — ABNORMAL LOW (ref >90)
Glucose, Bld: 92 mg/dL (ref 70–99)
Sodium: 140 mEq/L (ref 135–145)
Total Protein: 6.2 g/dL (ref 6.0–8.3)

## 2013-01-23 LAB — PRO B NATRIURETIC PEPTIDE: Pro B Natriuretic peptide (BNP): 1346 pg/mL — ABNORMAL HIGH (ref 0–450)

## 2013-01-23 MED ORDER — FUROSEMIDE 10 MG/ML IJ SOLN
40.0000 mg | Freq: Two times a day (BID) | INTRAMUSCULAR | Status: DC
  Administered 2013-01-24: 40 mg via INTRAVENOUS
  Filled 2013-01-23 (×4): qty 4

## 2013-01-23 MED ORDER — HYDRALAZINE HCL 10 MG PO TABS
10.0000 mg | ORAL_TABLET | Freq: Three times a day (TID) | ORAL | Status: DC
  Administered 2013-01-23 – 2013-01-24 (×3): 10 mg via ORAL
  Filled 2013-01-23 (×5): qty 1

## 2013-01-23 MED ORDER — CALCIUM POLYCARBOPHIL 625 MG PO TABS
625.0000 mg | ORAL_TABLET | Freq: Every day | ORAL | Status: DC
  Administered 2013-01-24: 625 mg via ORAL
  Filled 2013-01-23: qty 1

## 2013-01-23 MED ORDER — MEMANTINE HCL 10 MG PO TABS
10.0000 mg | ORAL_TABLET | Freq: Two times a day (BID) | ORAL | Status: DC
  Administered 2013-01-23 – 2013-01-24 (×2): 10 mg via ORAL
  Filled 2013-01-23 (×3): qty 1

## 2013-01-23 MED ORDER — SERTRALINE HCL 50 MG PO TABS
50.0000 mg | ORAL_TABLET | Freq: Every day | ORAL | Status: DC
  Administered 2013-01-24: 50 mg via ORAL
  Filled 2013-01-23: qty 1

## 2013-01-23 MED ORDER — ALUM & MAG HYDROXIDE-SIMETH 200-200-20 MG/5ML PO SUSP: 30.0000 mL | Freq: Four times a day (QID) | ORAL | Status: DC | PRN

## 2013-01-23 MED ORDER — SIMVASTATIN 20 MG PO TABS
20.0000 mg | ORAL_TABLET | Freq: Every day | ORAL | Status: DC
  Administered 2013-01-23: 20 mg via ORAL
  Filled 2013-01-23 (×2): qty 1

## 2013-01-23 MED ORDER — AMLODIPINE BESYLATE 5 MG PO TABS
5.0000 mg | ORAL_TABLET | Freq: Every morning | ORAL | Status: DC
  Administered 2013-01-24: 5 mg via ORAL
  Filled 2013-01-23: qty 1

## 2013-01-23 MED ORDER — SULFAMETHOXAZOLE-TRIMETHOPRIM 400-80 MG PO TABS
1.0000 | ORAL_TABLET | Freq: Every day | ORAL | Status: DC
  Administered 2013-01-23: 1 via ORAL
  Filled 2013-01-23 (×2): qty 1

## 2013-01-23 MED ORDER — PREDNISONE 10 MG PO TABS
10.0000 mg | ORAL_TABLET | ORAL | Status: DC
  Administered 2013-01-24: 10 mg via ORAL
  Filled 2013-01-23: qty 1

## 2013-01-23 MED ORDER — ONDANSETRON HCL 4 MG PO TABS: 4.0000 mg | ORAL_TABLET | Freq: Four times a day (QID) | ORAL | Status: DC | PRN

## 2013-01-23 MED ORDER — LEVOTHYROXINE SODIUM 150 MCG PO TABS
150.0000 ug | ORAL_TABLET | Freq: Every day | ORAL | Status: DC
  Administered 2013-01-24: 150 ug via ORAL
  Filled 2013-01-23 (×2): qty 1

## 2013-01-23 MED ORDER — ENOXAPARIN SODIUM 40 MG/0.4ML ~~LOC~~ SOLN
40.0000 mg | Freq: Every day | SUBCUTANEOUS | Status: DC
  Administered 2013-01-23: 40 mg via SUBCUTANEOUS
  Filled 2013-01-23 (×2): qty 0.4

## 2013-01-23 MED ORDER — LEVOTHYROXINE SODIUM 125 MCG PO TABS
125.0000 ug | ORAL_TABLET | Freq: Every day | ORAL | Status: DC
  Filled 2013-01-23 (×2): qty 1

## 2013-01-23 MED ORDER — ONDANSETRON HCL 4 MG/2ML IJ SOLN: 4.0000 mg | Freq: Four times a day (QID) | INTRAMUSCULAR | Status: DC | PRN

## 2013-01-23 MED ORDER — PANTOPRAZOLE SODIUM 40 MG PO TBEC
40.0000 mg | DELAYED_RELEASE_TABLET | Freq: Every day | ORAL | Status: DC
  Administered 2013-01-24: 40 mg via ORAL
  Filled 2013-01-23: qty 1

## 2013-01-23 MED ORDER — SODIUM CHLORIDE 0.9 % IV SOLN: 250.0000 mL | INTRAVENOUS | Status: DC | PRN

## 2013-01-23 MED ORDER — SODIUM CHLORIDE 0.9 % IJ SOLN: 3.0000 mL | Freq: Two times a day (BID) | INTRAMUSCULAR | Status: DC

## 2013-01-23 MED ORDER — PREDNISONE 5 MG PO TABS
15.0000 mg | ORAL_TABLET | ORAL | Status: DC
  Filled 2013-01-23: qty 1

## 2013-01-23 MED ORDER — SODIUM CHLORIDE 0.9 % IJ SOLN
3.0000 mL | Freq: Two times a day (BID) | INTRAMUSCULAR | Status: DC
  Administered 2013-01-23 – 2013-01-24 (×2): 3 mL via INTRAVENOUS

## 2013-01-23 MED ORDER — GUAIFENESIN 100 MG/5ML PO SOLN: 10.0000 mL | Freq: Four times a day (QID) | ORAL | Status: DC | PRN

## 2013-01-23 MED ORDER — FUROSEMIDE 10 MG/ML IJ SOLN
40.0000 mg | Freq: Once | INTRAMUSCULAR | Status: AC
  Administered 2013-01-23: 40 mg via INTRAVENOUS
  Filled 2013-01-23: qty 4

## 2013-01-23 MED ORDER — POTASSIUM CHLORIDE CRYS ER 20 MEQ PO TBCR
20.0000 meq | EXTENDED_RELEASE_TABLET | Freq: Two times a day (BID) | ORAL | Status: DC
  Administered 2013-01-23 – 2013-01-24 (×2): 20 meq via ORAL
  Filled 2013-01-23 (×3): qty 1

## 2013-01-23 MED ORDER — MAGNESIUM HYDROXIDE 400 MG/5ML PO SUSP: 20.0000 mL | Freq: Every day | ORAL | Status: DC | PRN

## 2013-01-23 MED ORDER — SACCHAROMYCES BOULARDII 250 MG PO CAPS
250.0000 mg | ORAL_CAPSULE | Freq: Two times a day (BID) | ORAL | Status: DC
  Administered 2013-01-23 – 2013-01-24 (×2): 250 mg via ORAL
  Filled 2013-01-23 (×3): qty 1

## 2013-01-23 MED ORDER — SODIUM CHLORIDE 0.9 % IJ SOLN: 3.0000 mL | INTRAMUSCULAR | Status: DC | PRN

## 2013-01-23 MED ORDER — LOPERAMIDE HCL 2 MG PO CAPS
2.0000 mg | ORAL_CAPSULE | ORAL | Status: DC | PRN
Start: 2013-01-23 — End: 2013-01-24

## 2013-01-23 MED ORDER — DONEPEZIL HCL 10 MG PO TABS
10.0000 mg | ORAL_TABLET | Freq: Every day | ORAL | Status: DC
  Administered 2013-01-23: 10 mg via ORAL
  Filled 2013-01-23 (×2): qty 1

## 2013-01-23 MED ORDER — ACETAMINOPHEN 500 MG PO TABS: 500.0000 mg | ORAL_TABLET | ORAL | Status: DC | PRN

## 2013-01-23 MED ORDER — ASPIRIN-DIPYRIDAMOLE ER 25-200 MG PO CP12
1.0000 | ORAL_CAPSULE | Freq: Two times a day (BID) | ORAL | Status: DC
  Administered 2013-01-23 – 2013-01-24 (×2): 1 via ORAL
  Filled 2013-01-23 (×3): qty 1

## 2013-01-23 NOTE — ED Notes (Signed)
Report attempted to floor.  Secretary conveyed charge nurse note that the floor was unaware that they were receiving a patient and that room assignment needed to be adjusted. Secretatry advised that Nursing would call back ASAP.

## 2013-01-23 NOTE — ED Provider Notes (Signed)
History     CSN: [id number]  Arrival date & time 01/23/13  1354   First MD Initiated Contact with Patient 01/23/13 1503      Chief Complaint  Patient presents with  . Fever  . Fatigue    (Consider location/radiation/quality/duration/timing/severity/associated sxs/prior treatment) HPI Pt sent from assisted living facility for increased cough, increased O2 requirement and suspicion for pneumonia. Pt only complains of fatigue and vague dizziness. No fever or chills. She has had a few loose stools. No abd pain, no urinary changes.  Past Medical History  Diagnosis Date  . Peripheral neuropathy   . Hypertension   . TIA (transient ischemic attack)   . Elevated sed rate   . Temporal arteritis     chronic steroids  . Hypothyroidism   . Colon cancer     colon  . History of hernia repair   . S/P rotator cuff surgery     right  . Diastolic CHF   . Cardiomegaly - hypertensive   . Chronic anemia   . Chronic steroid use   . Dementia   . Shortness of breath   . Hyperlipidemia   . CHF (congestive heart failure)   . Cardiomyopathy due to hypertension   . Dependence on supplemental oxygen   . Chronic UTI   . Dementia   . Giant cell arteritis     Past Surgical History  Procedure Laterality Date  . Tonsillectomy  1939  . Colectomy  1987  . Abdominal hysterectomy  1974  . Appendectomy  1947  . Hernia repair  1990  . Cholecystectomy  2000  . Rotator cuff repair  2004  . Right temporal artery biopsy  10/1998, 10/2009  . Right cataract extraction  2011  . Eye surgery    . Lumbar puncture  10/07/2004    Family History  Problem Relation Age of Onset  . Dementia Mother   . Heart attack Father     History  Substance Use Topics  . Smoking status: Never Smoker   . Smokeless tobacco: Never Used  . Alcohol Use: No    OB History   Grav Para Term Preterm Abortions TAB SAB Ect Mult Living                  Review of Systems  Constitutional: Negative for fever and chills.   Respiratory: Positive for cough.   Cardiovascular: Positive for leg swelling. Negative for chest pain and palpitations.  Gastrointestinal: Positive for diarrhea. Negative for nausea, vomiting and abdominal pain.  Genitourinary: Negative for dysuria.  Musculoskeletal: Negative for back pain.  Skin: Negative for rash and wound.  Neurological: Positive for dizziness, weakness and light-headedness. Negative for syncope, numbness and headaches.  All other systems reviewed and are negative.    Allergies  Apap; Ciprofloxacin; Codeine; Demerol; Morphine and related; Nitrofurantoin monohyd macro; Pregabalin; and Septra  Home Medications   No current outpatient prescriptions on file.  BP 152/52  Pulse 89  Temp(Src) 97.6 F (36.4 C) (Oral)  Resp 18  Ht 5\' 10"  (1.778 m)  Wt 203 lb 12.8 oz (92.443 kg)  BMI 29.24 kg/m2  SpO2 96%  Physical Exam  Nursing note and vitals reviewed. Constitutional: She is oriented to person, place, and time. She appears well-developed and well-nourished. No distress.  HENT:  Head: Normocephalic and atraumatic.  Mouth/Throat: Oropharynx is clear and moist.  Eyes: EOM are normal. Pupils are equal, round, and reactive to light.  Neck: Normal range of motion. Neck supple.  Cardiovascular: Normal rate and regular rhythm.   Pulmonary/Chest: Effort normal and breath sounds normal. No respiratory distress. She has no wheezes. She has no rales. She exhibits no tenderness.  Abdominal: Soft. Bowel sounds are normal. She exhibits no distension and no mass. There is no tenderness. There is no rebound and no guarding.  Musculoskeletal: Normal range of motion. She exhibits edema (1+bl LE edema, compression stockings in place. ). She exhibits no tenderness.  Neurological: She is alert and oriented to person, place, and time.  5/5 motor in all ext, sensation intact  Skin: Skin is warm and dry. No rash noted. No erythema.  Psychiatric: She has a normal mood and affect. Her  behavior is normal.    ED Course  Procedures (including critical care time)  Labs Reviewed  CBC WITH DIFFERENTIAL - Abnormal; Notable for the following:    RBC 3.27 (*)    Hemoglobin 9.2 (*)    HCT 29.4 (*)    All other components within normal limits  COMPREHENSIVE METABOLIC PANEL - Abnormal; Notable for the following:    Albumin 2.6 (*)    GFR calc non Af Amer 55 (*)    GFR calc Af Amer 64 (*)    All other components within normal limits  PRO B NATRIURETIC PEPTIDE - Abnormal; Notable for the following:    Pro B Natriuretic peptide (BNP) 1346.0 (*)    All other components within normal limits  CBC - Abnormal; Notable for the following:    RBC 3.48 (*)    Hemoglobin 9.2 (*)    HCT 30.8 (*)    MCHC 29.9 (*)    All other components within normal limits  CREATININE, SERUM - Abnormal; Notable for the following:    GFR calc non Af Amer 57 (*)    GFR calc Af Amer 66 (*)    All other components within normal limits  CLOSTRIDIUM DIFFICILE BY PCR  CLOSTRIDIUM DIFFICILE BY PCR  TROPONIN I  URINALYSIS, ROUTINE W REFLEX MICROSCOPIC  TROPONIN I  TSH  TROPONIN I  TROPONIN I   Dg Chest 1 View  01/23/2013  *RADIOLOGY REPORT*  Clinical Data: Cough with shortness of breath.  CHEST - 1 VIEW  Comparison: 12/11/2012  Findings: 1536 hours. The cardiopericardial silhouette is enlarged. Interstitial markings are diffusely coarsened with chronic features.  Vascular congestion noted without airspace pulmonary edema.  No focal lung consolidation.  The patchy opacity the left base with blunting of the left costophrenic angle is stable and may be related to chronic atelectasis or scarring.  Small chronic left pleural effusion is also a consideration. Bone windows reveal no worrisome lytic or sclerotic osseous lesions.  IMPRESSION: The no substantial change.  Cardiomegaly with vascular congestion and persistent retrocardiac opacity which may be related to atelectasis/infiltrate or scarring.  There appears  to be a persistent tiny left pleural effusion associated.   Original Report Authenticated By: Kennith Center, M.D.      1. CHF exacerbation   2. Cardiomegaly - hypertensive   3. Dementia   4. Failure to thrive in adult      Date: 01/23/2013  Rate: 85  Rhythm: normal sinus rhythm  QRS Axis: normal  Intervals: normal  ST/T Wave abnormalities: nonspecific T wave changes  Conduction Disutrbances:right bundle branch block  Narrative Interpretation:   Old EKG Reviewed: unchanged    MDM    Discussed with [facility] and will admit      Loren Racer, MD 01/24/13 [phone]

## 2013-01-23 NOTE — ED Notes (Addendum)
Attempted IV in left AC without success.  IV team called.

## 2013-01-23 NOTE — ED Notes (Signed)
Unable to get IV access- passed on Lasix 40mg  IV to oncoming RN.

## 2013-01-23 NOTE — ED Notes (Signed)
Pt from [facility]  Pt began feeling malaise; RN increased from 2L to 4L due to pt satting in 70s per staff at [facility].  EMS reassessed pt sats at 98% on 4L; EMS decreased to 2L--> 94% currently.  Pt has had 250cc NS; PIV in left hand 20g.  Pt has hx of C. Diff one month ago and antibiotic therapy competed, UTIs, hypertension, thyroid issues, ringworm, dementia with AMS, COPD, hyperlipidemia, and peripheral neuropathy.  Pt is currently oriented.  No N/V/D.

## 2013-01-23 NOTE — H&P (Signed)
[facility] History and Physical  [name] ZOX:[id number] DOB: 21-Nov-1929    PCP:   Cain Saupe, MD   Chief Complaint: Shortness of breath.  HPI: [name] is an 79 y.o. female under palliative care for FTT, having hx of CHF, chronic steroid use, hypothyroidism, HTN, Hyperlipidemia, recently admitted for sepsis and C diffi infection, presents to the ER with increased shortness of breath for a few days.  She has increased pedal edema.  No fever, chills, or productive cough.  Evaluation in the ER showed CXR with CM and vascular congestion, ? Atelectasis vs infiltrate, a BNP of 1300's, and exam showed bilateral basilar rales.  Her renal fx tests were normal.  She has no leukocytosis and her Hb was 9.2 g/DL.  She has negative troponin.  Her EKG showed SR with incomplete RBBB and no acute ST-T changes.  Hospitalist was asked to admit her for CHF.   Rewiew of Systems:  Constitutional: Negative for malaise, fever and chills. No significant weight loss or weight gain Eyes: Negative for eye pain, redness and discharge, diplopia, visual changes, or flashes of light. ENMT: Negative for ear pain, hoarseness, nasal congestion, sinus pressure and sore throat. No headaches; tinnitus, drooling, or problem swallowing. Cardiovascular: Negative for chest pain, palpitations, diaphoresis,  orthopnea, PND Respiratory: Negative for cough, hemoptysis, wheezing and stridor. No pleuritic chestpain. Gastrointestinal: Negative for nausea, vomiting, diarrhea, constipation, abdominal pain, melena, blood in stool, hematemesis, jaundice and rectal bleeding.    Genitourinary: Negative for frequency, dysuria, incontinence,flank pain and hematuria; Musculoskeletal: Negative for back pain and neck pain. Negative for trauma.;  Skin: . Negative for pruritus, rash, abrasions, bruising and skin lesion.; ulcerations Neuro: Negative for headache, lightheadedness and neck stiffness. Negative for weakness, altered  level of consciousness , altered mental status, extremity weakness, burning feet, involuntary movement, seizure and syncope.  Psych: negative for anxiety, depression, insomnia, tearfulness, panic attacks, hallucinations, paranoia, suicidal or homicidal ideation    Past Medical History  Diagnosis Date  . Peripheral neuropathy   . Hypertension   . TIA (transient ischemic attack)   . Elevated sed rate   . Temporal arteritis     chronic steroids  . Hypothyroidism   . Colon cancer     colon  . History of hernia repair   . S/P rotator cuff surgery     right  . Diastolic CHF   . Cardiomegaly - hypertensive   . Chronic anemia   . Chronic steroid use   . Dementia   . Shortness of breath   . Hyperlipidemia   . CHF (congestive heart failure)   . Cardiomyopathy due to hypertension   . Dependence on supplemental oxygen   . Chronic UTI   . Dementia   . Giant cell arteritis     Past Surgical History  Procedure Laterality Date  . Tonsillectomy  1939  . Colectomy  1987  . Abdominal hysterectomy  1974  . Appendectomy  1947  . Hernia repair  1990  . Cholecystectomy  2000  . Rotator cuff repair  2004  . Right temporal artery biopsy  10/1998, 10/2009  . Right cataract extraction  2011  . Eye surgery    . Lumbar puncture  10/07/2004    Medications:  HOME MEDS: Prior to Admission medications   Medication Sig Start Date End Date Taking? Authorizing Provider  acetaminophen (TYLENOL) 500 MG tablet Take 500 mg by mouth every 4 (four) hours as needed for pain (FOR FEVER UP TO 101,  HEADACHE OR MINOR DISCOMFORT-HASN'T TAKEN BUT IS ON MAR AS STANDING ORDER).    Yes Historical Provider, MD  alum & mag hydroxide-simeth (MAALOX PLUS) 400-400-40 MG/5ML suspension Take 30 mLs by mouth every 6 (six) hours as needed for indigestion (THIS IS ON MAR AS STAQNDIND ORDER-HASN'T TAKEN).    Yes Historical Provider, MD  amLODipine (NORVASC) 5 MG tablet Take 5 mg by mouth every morning.   Yes Historical  Provider, MD  Cranberry 200 MG CAPS Take 500 mg by mouth 2 (two) times daily.    Yes Historical Provider, MD  dipyridamole-aspirin (AGGRENOX) 25-200 MG per 12 hr capsule Take 1 capsule by mouth 2 (two) times daily.     Yes Historical Provider, MD  donepezil (ARICEPT) 10 MG tablet Take 10 mg by mouth at bedtime.   Yes Historical Provider, MD  guaiFENesin (IOPHEN-NR) 100 MG/5ML liquid Take 10 mLs by mouth 4 (four) times daily as needed for cough (HASN'T TAKEN-THIS IS ON MAR AS STANDING ORDER).   Yes Historical Provider, MD  hydrALAZINE (APRESOLINE) 10 MG tablet Take 1 tablet (10 mg total) by mouth every 8 (eight) hours. 12/09/12  Yes Nishant Dhungel, MD  levothyroxine (SYNTHROID, LEVOTHROID) 150 MCG tablet Take 150 mcg by mouth daily before breakfast.   Yes Historical Provider, MD  loperamide (ANTI-DIARRHEAL) 2 MG tablet Take 2 mg by mouth as needed for diarrhea or loose stools (TAKE ONE TABLET WITH EACH LOOSE STOOL AS NEEDED FOR DIARRHEA, DO NOT EXCEED 8 DOSES IN 24 HOURS-HASN'T TAKEN BUT IS ON MAR AS STANDING ORDER).   Yes Historical Provider, MD  magnesium hydroxide (MILK OF MAGNESIA) 400 MG/5ML suspension Take 20 mLs by mouth daily as needed for constipation (AT BEDTIME AS NEEDED-HASN'T TAKEN, IS ON MAR AS STANDING ORDER).   Yes Historical Provider, MD  memantine (NAMENDA) 10 MG tablet Take 1 tablet (10 mg total) by mouth 2 (two) times daily. 12/09/12  Yes Nishant Dhungel, MD  Multiple Vitamin (THERA/BETA-CAROTENE) TABS Take by mouth daily.   Yes Historical Provider, MD  omeprazole (PRILOSEC) 20 MG capsule Take 20 mg by mouth daily.     Yes Historical Provider, MD  polycarbophil (FIBERCON) 625 MG tablet Take 625 mg by mouth daily.   Yes Historical Provider, MD  potassium chloride SA (K-DUR,KLOR-CON) 20 MEQ tablet Take 20 mEq by mouth 2 (two) times daily.   Yes Historical Provider, MD  predniSONE (DELTASONE) 10 MG tablet Take 10 mg by mouth every other day. Take one tablet every other day alternating  with 15mg    Yes Historical Provider, MD  predniSONE (DELTASONE) 5 MG tablet Take 15 mg by mouth daily. TAKE 3 TABLETS (15MG  TOTAL) EVERY OTHER DAY ALTERNATING WITH THE 10MG  DOSE   Yes Historical Provider, MD  saccharomyces boulardii (FLORASTOR) 250 MG capsule Take 250 mg by mouth 2 (two) times daily.   Yes Historical Provider, MD  sertraline (ZOLOFT) 50 MG tablet Take 50 mg by mouth daily.     Yes Historical Provider, MD  simvastatin (ZOCOR) 20 MG tablet Take 20 mg by mouth at bedtime.     Yes Historical Provider, MD  sulfamethoxazole-trimethoprim (BACTRIM,SEPTRA) 400-80 MG per tablet Take 1 tablet by mouth at bedtime.   Yes Historical Provider, MD  levothyroxine (SYNTHROID) 125 MCG tablet Take 1 tablet (125 mcg total) by mouth daily. 12/09/12   Eddie North, MD     Allergies:  Allergies  Allergen Reactions  . Apap (Acetaminophen)     Unknown  . Ciprofloxacin Nausea And Vomiting  .  Codeine Other (See Comments)    halucinations  . Demerol Other (See Comments)    halucinations  . Morphine And Related Other (See Comments)    halucinations  . Nitrofurantoin Monohyd Macro Other (See Comments)    Per MAR  . Pregabalin Other (See Comments)    unknown  . Septra (Bactrim) Other (See Comments)    Unknown cant tolerate DS    Social History:   reports that she has never smoked. She has never used smokeless tobacco. She reports that she does not drink alcohol or use illicit drugs.  Family History: Family History  Problem Relation Age of Onset  . Dementia Mother   . Heart attack Father      Physical Exam: Filed Vitals:   01/23/13 1415 01/23/13 1430 01/23/13 2013 01/23/13 2015  BP: 132/45  144/49 144/49  Pulse: 85  84 84  Temp: 98.6 F (37 C) 98.9 F (37.2 C)    TempSrc: Oral Rectal    Resp: 20  24 19   SpO2: 96%  98% 99%   Blood pressure 144/49, pulse 84, temperature 98.9 F (37.2 C), temperature source Rectal, resp. rate 19, SpO2 99.00%.  GEN:  Pleasant patient lying in  the stretcher in no acute distress; cooperative with exam. PSYCH:  alert and oriented x4; does not appear anxious or depressed; affect is appropriate. HEENT: Mucous membranes pink and anicteric; PERRLA; EOM intact; no cervical lymphadenopathy nor thyromegaly or carotid bruit; no JVD; There were no stridor. Neck is very supple. Breasts:: Not examined CHEST WALL: No tenderness CHEST: Normal respiration, with bilateral rales.  No wheezing. HEART: Regular rate and rhythm.  There are no murmur, rub, or gallops.   BACK: No kyphosis or scoliosis; no CVA tenderness ABDOMEN: soft and non-tender; no masses, no organomegaly, normal abdominal bowel sounds; no pannus; no intertriginous candida. There is no rebound and no distention. Rectal Exam: Not done EXTREMITIES: No bone or joint deformity; age-appropriate arthropathy of the hands and knees; 1-2 plus edema; no ulcerations.  There is no calf tenderness. Genitalia: not examined PULSES: 2+ and symmetric SKIN: Normal hydration no rash or ulceration CNS: Cranial nerves 2-12 grossly intact no focal lateralizing neurologic deficit.  Speech is fluent; uvula elevated with phonation, facial symmetry and tongue midline. DTR are normal bilaterally, cerebella exam is intact, barbinski is negative and strengths are equaled bilaterally.  No sensory loss.   Labs on Admission:  Basic Metabolic Panel:  Recent Labs Lab 01/23/13 1640  NA 140  K 4.5  CL 106  CO2 28  GLUCOSE 92  BUN 9  CREATININE 0.95  CALCIUM 8.8   Liver Function Tests:  Recent Labs Lab 01/23/13 1640  AST 16  ALT 10  ALKPHOS 81  BILITOT 0.3  PROT 6.2  ALBUMIN 2.6*   No results found for this basename: LIPASE, AMYLASE,  in the last 168 hours No results found for this basename: AMMONIA,  in the last 168 hours CBC:  Recent Labs Lab 01/23/13 1640  WBC 6.2  NEUTROABS 4.1  HGB 9.2*  HCT 29.4*  MCV 89.9  PLT 275   Cardiac Enzymes:  Recent Labs Lab 01/23/13 1640  TROPONINI  <0.30    CBG: No results found for this basename: GLUCAP,  in the last 168 hours   Radiological Exams on Admission: Dg Chest 1 View  01/23/2013  *RADIOLOGY REPORT*  Clinical Data: Cough with shortness of breath.  CHEST - 1 VIEW  Comparison: 12/11/2012  Findings: 1536 hours. The cardiopericardial silhouette is  enlarged. Interstitial markings are diffusely coarsened with chronic features.  Vascular congestion noted without airspace pulmonary edema.  No focal lung consolidation.  The patchy opacity the left base with blunting of the left costophrenic angle is stable and may be related to chronic atelectasis or scarring.  Small chronic left pleural effusion is also a consideration. Bone windows reveal no worrisome lytic or sclerotic osseous lesions.  IMPRESSION: The no substantial change.  Cardiomegaly with vascular congestion and persistent retrocardiac opacity which may be related to atelectasis/infiltrate or scarring.  There appears to be a persistent tiny left pleural effusion associated.   Original Report Authenticated By: Kennith Center, M.D.     EKG: Independently reviewed.SR with incomplete RBBB.  No ST-T changes.   Assessment/Plan Present on Admission:  . Acute on chronic diastolic heart failure . Acute exacerbation of CHF (congestive heart failure) . Left ventricular hypertrophy due to hypertensive disease . Clostridium difficile diarrhea . Cardiomegaly - hypertensive . Dementia . Failure to thrive in adult . Hypothyroidism . Volume overload  PLAN:  Will admit her for mild CHF.  She is slightly volume overload, but is stable.  Will admit her to obs and dirurese her with Lasix IV 40mg  BID.  She has no evidence of pulmonary infection.  No leukocytosis and no cough.  Since she recently has C diff, will resend another sample, and prudently place her on enteric precaution, but I don't think she has active infection now.  She has been evaluated by Palliative care and has the MOST form, she  is DNR/DNI.  She can probably be discharged home tomorrow if she diureses adequately.  Thank you for allowing me to participate in the care of this nice patient.  Other plans as per orders.  Code Status: DNR.   Houston Siren, MD. [facility] Pager [phone] 7pm to 7am.  01/23/2013, 10:12 PM

## 2013-01-23 NOTE — ED Notes (Signed)
Bed:WA11<BR> Expected date:<BR> Expected time:<BR> Means of arrival:<BR> Comments:<BR> ems

## 2013-01-23 NOTE — ED Notes (Signed)
Pt attempted to ambulate but both the Nurse tech and the nurse had difficulty keeping the pt in the standing state.  Pt verbalized weakness and the feeling that she was not able to ambulate safely.

## 2013-01-24 DIAGNOSIS — Z9981 Dependence on supplemental oxygen: Secondary | ICD-10-CM

## 2013-01-24 DIAGNOSIS — R5381 Other malaise: Secondary | ICD-10-CM

## 2013-01-24 LAB — CLOSTRIDIUM DIFFICILE BY PCR: Toxigenic C. Difficile by PCR: NEGATIVE

## 2013-01-24 LAB — CREATININE, SERUM
Creatinine, Ser: 0.93 mg/dL (ref 0.50–1.10)
GFR calc non Af Amer: 57 mL/min — ABNORMAL LOW (ref >90)

## 2013-01-24 LAB — TROPONIN I: Troponin I: <0.3 ng/mL (ref <0.30)

## 2013-01-24 MED ORDER — FUROSEMIDE 40 MG PO TABS: 40.0000 mg | ORAL_TABLET | Freq: Every day | ORAL | Status: DC

## 2013-01-24 NOTE — Discharge Summary (Addendum)
Physician Discharge Summary  [name] WUJ:[id number] DOB: 1930-05-04 DOA: 01/23/2013  PCP: Cain Saupe, MD  Admit date: 01/23/2013 Discharge date: 01/24/2013  Time spent: >30 minutes  Recommendations for Outpatient Follow-up:      Follow-up Information   Follow up with FULP, CAMMIE, MD. (in 5-7days, call for appt upon discharge)    Contact information:   [address] [place], [address] [address] [phone]       Discharge Diagnoses:  Active Problems:   Hypothyroidism   Dementia   Cardiomegaly - hypertensive   Acute exacerbation of CHF (congestive heart failure)   Volume overload   Left ventricular hypertrophy due to hypertensive disease   Acute on chronic diastolic heart failure   Clostridium difficile diarrhea   Failure to thrive in adult   Discharge Condition: improved/stable  Diet recommendation: Heart healthy diet  Filed Weights   01/23/13 2307  Weight: 92.443 kg (203 lb 12.8 oz)    History of present illness:  [name] is an 79 y.o. female under palliative care for FTT, having hx of CHF, chronic steroid use, hypothyroidism, HTN, Hyperlipidemia, recently admitted for sepsis and C diffi infection, presents to the ER with increased shortness of breath for a few days. She has increased pedal edema. No fever, chills, or productive cough. Evaluation in the ER showed CXR with CM and vascular congestion, ? Atelectasis vs infiltrate, a BNP of 1300's, and exam showed bilateral basilar rales. Her renal fx tests were normal. She has no leukocytosis and her Hb was 9.2 g/DL. She has negative troponin. Her EKG showed SR with incomplete RBBB and no acute ST-T changes. Hospitalist was asked to admit her for CHF.   Hospital Course:  Acute on chronic diastolic heart failure -as discussed above upon admission the patient had a chest x-ray done which showed cardiomegaly with vascular congestion and persistent retrocardiac opacity atelectasis/infiltrate or  scarring, also tiny left pleural effusion associated. Her BNP was elevated at 1346.she was started on IV Lasix for diuresis. Her symptoms improved -with no further shortness of breath. She has been satting at 93-99% on 2 L nasal cannula O2. per her daughter she is home O2 dependent-on 2 L is a cannula. she had cardiac enzymes cycled and came back negative., And she has remained chest pain-free. I discussed patient will with her daughter and she is eager for her to return to [facility] assisted living today, given that she is clinically improved I think this is reasonable. I contacted the social worker to help facilitate with her discharge back to the assisted-living facility. She be discharged on oral Lasix 40 mg once daily at this time and she is to followup with her PCP in 5-7 days and have her fluid status and renal function & electrolytes closely monitored and her Lasix dose adjusted/decreased when clinically appropriate. . Left ventricular hypertrophy due to hypertensive disease -Was maintained on her outpatient medications and is to continue them upon discharge.  . history of Clostridium difficile diarrhea -She has had no diarrhea reported during this hospitalization.  . Cardiomegaly - hypertensive . Dementia . Failure to thrive in adult/generalized weakness -Palliative care to continue to follow the assisted living facility as previously  -she is to continue PT/OT upon discharge . Hypothyroidism -She is to continue Synthroid as prior to admission.    Procedures: none Consultations:  none  Discharge Exam: Filed Vitals:   01/23/13 2307 01/24/13 0524 01/24/13 1008 01/24/13 1359  BP: 152/52 146/53 116/48 124/46  Pulse:  89 96  90  Temp: 97.6 F (36.4 C) 97.5 F (36.4 C)  97.8 F (36.6 C)  TempSrc: Oral Oral  Oral  Resp: 18 18  16   Height: 5\' 10"  (1.778 m)     Weight: 92.443 kg (203 lb 12.8 oz)     SpO2: 96% 98%  93%    General: She is alert and appropriate, denies shortness  of breath. In no apparent distress. Cardiovascular: Regular rate and rhythm, normal S1-S2 Respiratory: Decreased breath sounds at bases, otherwise clear to auscultation Extremities: No cyanosis and no edema  Discharge Instructions  Discharge Orders   Future Appointments Provider Department Dept Phone   01/30/2013 12:00 PM Huston Foley, MD [facility] [phone]   Future Orders Complete By Expires     Diet - low sodium heart healthy  As directed     Increase activity slowly  As directed         Medication List    TAKE these medications       acetaminophen 500 MG tablet  Commonly known as:  TYLENOL  Take 500 mg by mouth every 4 (four) hours as needed for pain (FOR FEVER UP TO 101, HEADACHE OR MINOR DISCOMFORT-HASN'T TAKEN BUT IS ON MAR AS STANDING ORDER).     alum & mag hydroxide-simeth 400-400-40 MG/5ML suspension  Commonly known as:  MAALOX PLUS  Take 30 mLs by mouth every 6 (six) hours as needed for indigestion (THIS IS ON MAR AS STAQNDIND ORDER-HASN'T TAKEN).     amLODipine 5 MG tablet  Commonly known as:  NORVASC  Take 5 mg by mouth every morning.     ANTI-DIARRHEAL 2 MG tablet  Generic drug:  loperamide  Take 2 mg by mouth as needed for diarrhea or loose stools (TAKE ONE TABLET WITH EACH LOOSE STOOL AS NEEDED FOR DIARRHEA, DO NOT EXCEED 8 DOSES IN 24 HOURS-HASN'T TAKEN BUT IS ON MAR AS STANDING ORDER).     Cranberry 200 MG Caps  Take 500 mg by mouth 2 (two) times daily.     dipyridamole-aspirin 200-25 MG per 12 hr capsule  Commonly known as:  AGGRENOX  Take 1 capsule by mouth 2 (two) times daily.     donepezil 10 MG tablet  Commonly known as:  ARICEPT  Take 10 mg by mouth at bedtime.     furosemide 40 MG tablet  Commonly known as:  LASIX  Take 1 tablet (40 mg total) by mouth daily.     hydrALAZINE 10 MG tablet  Commonly known as:  APRESOLINE  Take 1 tablet (10 mg total) by mouth every 8 (eight) hours.     IOPHEN-NR 100 MG/5ML liquid   Generic drug:  guaiFENesin  Take 10 mLs by mouth 4 (four) times daily as needed for cough (HASN'T TAKEN-THIS IS ON MAR AS STANDING ORDER).     levothyroxine 150 MCG tablet  Commonly known as:  SYNTHROID, LEVOTHROID  Take 150 mcg by mouth daily before breakfast.     magnesium hydroxide 400 MG/5ML suspension  Commonly known as:  MILK OF MAGNESIA  Take 20 mLs by mouth daily as needed for constipation (AT BEDTIME AS NEEDED-HASN'T TAKEN, IS ON MAR AS STANDING ORDER).     memantine 10 MG tablet  Commonly known as:  NAMENDA  Take 1 tablet (10 mg total) by mouth 2 (two) times daily.     omeprazole 20 MG capsule  Commonly known as:  PRILOSEC  Take 20 mg by mouth daily.  polycarbophil 625 MG tablet  Commonly known as:  FIBERCON  Take 625 mg by mouth daily.     potassium chloride SA 20 MEQ tablet  Commonly known as:  K-DUR,KLOR-CON  Take 20 mEq by mouth 2 (two) times daily.     predniSONE 10 MG tablet  Commonly known as:  DELTASONE  Take 10 mg by mouth every other day. Take one tablet every other day alternating with 15mg      predniSONE 5 MG tablet  Commonly known as:  DELTASONE  Take 15 mg by mouth daily. TAKE 3 TABLETS (15MG  TOTAL) EVERY OTHER DAY ALTERNATING WITH THE 10MG  DOSE     saccharomyces boulardii 250 MG capsule  Commonly known as:  FLORASTOR  Take 250 mg by mouth 2 (two) times daily.     sertraline 50 MG tablet  Commonly known as:  ZOLOFT  Take 50 mg by mouth daily.     simvastatin 20 MG tablet  Commonly known as:  ZOCOR  Take 20 mg by mouth at bedtime.     sulfamethoxazole-trimethoprim 400-80 MG per tablet  Commonly known as:  BACTRIM,SEPTRA  Take 1 tablet by mouth at bedtime.     THERA/BETA-CAROTENE Tabs  Take by mouth daily.       Allergies  Allergen Reactions  . Apap (Acetaminophen)     Unknown  . Ciprofloxacin Nausea And Vomiting  . Codeine Other (See Comments)    halucinations  . Demerol Other (See Comments)    halucinations  . Morphine And  Related Other (See Comments)    halucinations  . Nitrofurantoin Monohyd Macro Other (See Comments)    Per MAR  . Pregabalin Other (See Comments)    unknown  . Septra (Bactrim) Other (See Comments)    Unknown cant tolerate DS       Follow-up Information   Follow up with FULP, CAMMIE, MD. (in 5-7days, call for appt upon discharge)    Contact information:   [address] [place], [address] [place] [address] [phone]        The results of significant diagnostics from this hospitalization (including imaging, microbiology, ancillary and laboratory) are listed below for reference.    Significant Diagnostic Studies: Dg Chest 1 View  01/23/2013  *RADIOLOGY REPORT*  Clinical Data: Cough with shortness of breath.  CHEST - 1 VIEW  Comparison: 12/11/2012  Findings: 1536 hours. The cardiopericardial silhouette is enlarged. Interstitial markings are diffusely coarsened with chronic features.  Vascular congestion noted without airspace pulmonary edema.  No focal lung consolidation.  The patchy opacity the left base with blunting of the left costophrenic angle is stable and may be related to chronic atelectasis or scarring.  Small chronic left pleural effusion is also a consideration. Bone windows reveal no worrisome lytic or sclerotic osseous lesions.  IMPRESSION: The no substantial change.  Cardiomegaly with vascular congestion and persistent retrocardiac opacity which may be related to atelectasis/infiltrate or scarring.  There appears to be a persistent tiny left pleural effusion associated.   Original Report Authenticated By: Kennith Center, M.D.     Microbiology: Recent Results (from the past 240 hour(s))  CLOSTRIDIUM DIFFICILE BY PCR     Status: None   Collection Time    01/23/13  3:17 PM      Result Value Range Status   C difficile by pcr NEGATIVE  NEGATIVE Final     Labs: Basic Metabolic Panel:  Recent Labs Lab 01/23/13 1640 01/23/13 2320  NA 140  --   K 4.5  --  CL 106  --    CO2 28  --   GLUCOSE 92  --   BUN 9  --   CREATININE 0.95 0.93  CALCIUM 8.8  --    Liver Function Tests:  Recent Labs Lab 01/23/13 1640  AST 16  ALT 10  ALKPHOS 81  BILITOT 0.3  PROT 6.2  ALBUMIN 2.6*   No results found for this basename: LIPASE, AMYLASE,  in the last 168 hours No results found for this basename: AMMONIA,  in the last 168 hours CBC:  Recent Labs Lab 01/23/13 1640 01/23/13 2320  WBC 6.2 5.3  NEUTROABS 4.1  --   HGB 9.2* 9.2*  HCT 29.4* 30.8*  MCV 89.9 88.5  PLT 275 264   Cardiac Enzymes:  Recent Labs Lab 01/23/13 1640 01/23/13 2320 01/24/13 0445  TROPONINI <0.30 <0.30 <0.30   BNP: BNP (last 3 results)  Recent Labs  10/08/12 1723 10/12/12 0530 01/23/13 1640  PROBNP 1356.0* 8213.0* 1346.0*   CBG: No results found for this basename: GLUCAP,  in the last 168 hours     Signed:  Adanely Reynoso C  [facility] 01/24/2013, 2:53 PM

## 2013-01-24 NOTE — Care Management Note (Addendum)
    Page 1 of 1   01/24/2013     4:17:53 PM   CARE MANAGEMENT NOTE 01/24/2013  Patient:  [name], [name]   Account Number:  [id number]  Date Initiated:  01/24/2013  Documentation initiated by:  [facility]  Subjective/Objective Assessment:   ADMITTED W/SOB.CHF.HX CHF     Action/Plan:   FROM [facility].HOME 02-[facility].HAS CANE,RW,3N1.HAS PCP,PHARMACY.   Anticipated DC Date:  01/24/2013   Anticipated DC Plan:  ASSISTED LIVING / REST HOME      DC Planning Services  CM consult      Choice offered to / List presented to:  C-1 Patient        HH arranged  HH-2 PT      HH agency  [facility]   Status of service:  Completed, signed off Medicare Important Message given?  NA - LOS <3 / Initial given by admissions (If response is "NO", the following Medicare IM given date fields will be blank) Date Medicare IM given:   Date Additional Medicare IM given:    Discharge Disposition:  ASSISTED LIVING  Per UR Regulation:  Reviewed for med. necessity/level of care/duration of stay  If discussed at Long Length of Stay Meetings, dates discussed:    Comments:  01/24/13 Shaivi Rothschild RN,BSN NCM [phone] [facility] TEL#[phone],THEY CONTRACT W/[facility] FOR HH,& PATIENT/DTR AGREE TO [facility].PT-HH.TC [facility] MARY MANLEY(REP)TEL#[phone]/FAX W/CONFIRMATION TO [phone],HHPT ORDER,F2F,FACE SHEET,H&P,D/C SUMMARY.D/C RETURN TO ALF, NO FURTHER ORDERS OR NEEDS.  01/24/13 Brenon Antosh RN,BSN NCM [phone] RECOMMEND PT/OT CONS.

## 2013-01-24 NOTE — Progress Notes (Signed)
Clinical Social Work Department BRIEF PSYCHOSOCIAL ASSESSMENT 01/24/2013  Patient:  [name], [name]     Account Number:  [id number]     Admit date:  01/23/2013  Clinical Social Worker:  Dennison Bulla  Date/Time:  01/24/2013 04:00 PM  Referred by:  Physician  Date Referred:  01/24/2013 Referred for  ALF Placement   Other Referral:   Interview type:  Patient Other interview type:    PSYCHOSOCIAL DATA Living Status:  FACILITY Admitted from facility:  OTHER Level of care:  Assisted Living Primary support name:  Angelique Blonder Primary support relationship to patient:  CHILD, ADULT Degree of support available:   Strong    CURRENT CONCERNS Current Concerns  Post-Acute Placement   Other Concerns:   Patient admitted from [facility]    SOCIAL WORK ASSESSMENT / PLAN CSW received referral due to patient being admitted from ALF and needing to dc back to facility today.    CSW reviewed chart and spoke with patient and dtr via phone. Dtr reports that patient is from [facility] and she will provide transportation back to ALF.    CSW prepared DC packet with FL2 and DNR form included. CSW spoke with ALF who is agreeable to admission today and aware the dtr will transport back to facility.    CSW spoke with CM who reports that [facility] was already arranged. CSW informed RN of dc plans and RN agreeable to give dtr DC packet when she arrives to transport patient.    CSW is signing off but available if further needs arise.   Assessment/plan status:  No Further Intervention Required Other assessment/ plan:   Information/referral to community resources:   Will return to ALF    PATIENT'S/FAMILY'S RESPONSE TO PLAN OF CARE: Patient and dtr thanked CSW for assistance and both parties agreeable to return to ALF.       Coverage for [facility]

## 2013-01-30 ENCOUNTER — Encounter: Payer: Self-pay | Admitting: Neurology

## 2013-01-30 ENCOUNTER — Ambulatory Visit (INDEPENDENT_AMBULATORY_CARE_PROVIDER_SITE_OTHER): Payer: Medicare Other | Admitting: Neurology

## 2013-01-30 ENCOUNTER — Other Ambulatory Visit: Payer: Self-pay | Admitting: *Deleted

## 2013-01-30 VITALS — BP 104/55 | HR 78 | Temp 98.1°F

## 2013-01-30 DIAGNOSIS — M316 Other giant cell arteritis: Secondary | ICD-10-CM

## 2013-01-30 DIAGNOSIS — R627 Adult failure to thrive: Secondary | ICD-10-CM

## 2013-01-30 DIAGNOSIS — R5381 Other malaise: Secondary | ICD-10-CM

## 2013-01-30 DIAGNOSIS — B999 Unspecified infectious disease: Secondary | ICD-10-CM

## 2013-01-30 NOTE — Patient Instructions (Addendum)
I think overall you are doing fairly well but I do want to suggest a few things today:  Remember to drink plenty of fluid, eat healthy meals and do not skip any meals. Try to eat protein with a every meal and eat a healthy snack such as fruit or nuts in between meals. Try to keep a regular sleep-wake schedule and try to exercise daily, particularly in the form of walking, 20-30 minutes a day, if you can.   Let's do blood work including sedimentation rate. I will call you back with results or Brett Canales, my assistant will call you with instructions regarding your steroids.   As far as your medications are concerned, I would like to suggest no changes.   I would suggest a follow up with one of my colleagues in 2 to 3 months. Andrey Campanile, our nurse, will make sure you have an appt. Please call us with any interim questions, concerns, problems, updates or refill requests.  Brett Canales is my clinical assistant and will answer any of your questions and relay your messages to me and also relay most of my messages to you.  Our phone number is [phone]. We also have an after hours call service for urgent matters and there is a physician on-call for urgent questions. For any emergencies you know to call 911 or go to the nearest emergency room.

## 2013-01-30 NOTE — Progress Notes (Signed)
Subjective:    Patient ID: [name] is a 79 y.o. female.  HPI Interim history:  [name] is a 79 year old right-handed woman who presents for followup consultation of her multiple neurological issues including normal pressure hydrocephalus, giant cell arteritis, history of TIA and stroke, confusion and memory loss. She is accompanied by her daughter, Angelique Blonder, today. This is her first visit with me and she previously followed with Dr. Fayrene Fearing love and was last seen by him on 10/02/2012, at which time he changed her prednisone regimen to 15 mg alternating with 10 mg every other day. Her falls assessment we'll score at the time is 24. He also ordered a CBC and CMP as well as ESR. He called them back with the blood results, and while the CMP and CBC were essentially unremarkable, her ESR was 60 which is not unusual for her. She has a complex medical history including hypertension, colon cancer, hyperlipidemia, stroke, thyroid disease, depression, anxiety, giant cell arteritis, peripheral neuropathy, TIA, peripheral vascular disease, recurrent urinary tract infections. Her current medications are Zyrtec, albuterol, furosemide, fiber, iron sulfate, amlodipine, promethazine, milk of magnesia, torsemide, Klor-Con, prednisone 15 mg alternating with 10 mg daily, Zocor, Zoloft, Synthroid, cranberry extract, Aggrenox, Aricept, Prilosec.  I reviewed Dr. Imagene Gurney prior notes and the patient's records and below is a summary of that review:  79 year old right-handed woman who lives at [facility] and has a gait disorder requiring a walker, history of right-sided stroke in February 2000, temporal arteritis with positive left temporal artery biopsy in March 2000. MRI in 2006 showed extensive pontine, periventricular, and subcortical white matter changes as well as ventriculomegaly and MRI. She has had confusional episodes and memory loss. She had a  negative temporal artery biopsy in February  2011. She was started on prednisone 60 mg daily because of continued headaches in March 2011. They were tapered down to 1 mg every other day. In December 2012 her ESR was 114 and MRI of the brain showed severe periventricular subcortical and pontine chronic small vessel ischemic disease. She was restarted on high-dose prednisone which was slowly tapered to 15 mg daily. In June of last year she was admitted to [facility] with urinary tract infection and was placed on oxygen which she uses now as needed. She was diagnosed with congestive heart failure. She was readmitted to [facility] in December 2013 with sepsis. In October 2013 her MMSE was 23, clock drawing was 4, animal fluency was 9.   She was admitted to [facility] on 12/11/2012 to 12/13/2012 due to altered mental status and was again diagnosed with sepsis with toxic encephalopathy. She had head CT on 12/11/2012 which I reviewed: No acute abnormality. 2. Stable atrophy, extensive chronic small vessel white matter ischemic changes in both cerebral hemispheres and extensive chronic small vessel ischemic changes in the pons.  Prior to this she was admitted to [facility] on 3/11 through 12/09/2012 for sepsis secondary to Escherichia coli UTI and C. difficile colitis. She was re-admitted on 4/30-5/1 for CHF exacerbation, now she is on O2 24/7 at 2 lpm since 2/14. She had a CXR on 4/30: The no substantial change. Cardiomegaly with vascular congestion and persistent retrocardiac opacity which may be related to atelectasis/infiltrate or scarring. There appears to be a persistent tiny left pleural effusion associated.  In January she had a Sz in the context of bacterial meningitis. she was in the hosptal from 1/13 to 10/12/12 for that. She was  treated for 10 days with broad-spectrum antibiotics, including vanc/rocephin/ampicillin. She currently denies any chest pain, shortness of breath, but does complain of eye itching.   Her  Past Medical History Is Significant For: Past Medical History  Diagnosis Date  . Peripheral neuropathy   . Hypertension   . TIA (transient ischemic attack)   . Elevated sed rate   . Temporal arteritis     chronic steroids  . Hypothyroidism   . Colon cancer     colon  . History of hernia repair   . S/P rotator cuff surgery     right  . Diastolic CHF   . Cardiomegaly - hypertensive   . Chronic anemia   . Chronic steroid use   . Dementia   . Shortness of breath   . Hyperlipidemia   . CHF (congestive heart failure)   . Cardiomyopathy due to hypertension   . Dependence on supplemental oxygen   . Chronic UTI   . Dementia   . Giant cell arteritis     Her Past Surgical History Is Significant For: Past Surgical History  Procedure Laterality Date  . Tonsillectomy  1939  . Colectomy  1987  . Abdominal hysterectomy  1974  . Appendectomy  1947  . Hernia repair  1990  . Cholecystectomy  2000  . Rotator cuff repair  2004  . Right temporal artery biopsy  10/1998, 10/2009  . Right cataract extraction  2011  . Eye surgery    . Lumbar puncture  10/07/2004    Her Family History Is Significant For: Family History  Problem Relation Age of Onset  . Dementia Mother   . Heart attack Father     Her Social History Is Significant For: History   Social History  . Marital Status: Widowed    Spouse Name: N/A    Number of Children: 2  . Years of Education: N/A   Occupational History  . RETIRED    Social History Main Topics  . Smoking status: Never Smoker   . Smokeless tobacco: Never Used  . Alcohol Use: No  . Drug Use: No  . Sexually Active: Not Currently   Other Topics Concern  . Not on file   Social History Narrative   Widowed.  Has lived with her daughter for the past 2.5 years, but most recently d/c'd to ALF/SNF.  Ambulates with a walker.    Her Allergies Are:  Allergies  Allergen Reactions  . Apap (Acetaminophen)     Unknown  . Ciprofloxacin Nausea And Vomiting   . Codeine Other (See Comments)    halucinations  . Demerol Other (See Comments)    halucinations  . Morphine And Related Other (See Comments)    halucinations  . Nitrofurantoin Monohyd Macro Other (See Comments)    Per MAR  . Pregabalin Other (See Comments)    unknown  . Septra (Bactrim) Other (See Comments)    Unknown cant tolerate DS  :   Her Current Medications Are:  Outpatient Encounter Prescriptions as of 01/30/2013  Medication Sig Dispense Refill  . acetaminophen (TYLENOL) 500 MG tablet Take 500 mg by mouth every 4 (four) hours as needed for pain (FOR FEVER UP TO 101, HEADACHE OR MINOR DISCOMFORT-HASN'T TAKEN BUT IS ON MAR AS STANDING ORDER).       Marland Kitchen alum & mag hydroxide-simeth (MAALOX PLUS) 400-400-40 MG/5ML suspension Take 30 mLs by mouth every 6 (six) hours as needed for indigestion (THIS IS ON MAR AS STAQNDIND ORDER-HASN'T TAKEN).       Marland Kitchen  amLODipine (NORVASC) 5 MG tablet Take 5 mg by mouth every morning.      . clotrimazole-betamethasone (LOTRISONE) cream       . Cranberry 200 MG CAPS Take 500 mg by mouth 2 (two) times daily.       Marland Kitchen donepezil (ARICEPT) 10 MG tablet Take 10 mg by mouth at bedtime.      . furosemide (LASIX) 40 MG tablet Take 1 tablet (40 mg total) by mouth daily.  30 tablet    . guaiFENesin (IOPHEN-NR) 100 MG/5ML liquid Take 10 mLs by mouth 4 (four) times daily as needed for cough (HASN'T TAKEN-THIS IS ON MAR AS STANDING ORDER).      . hydrALAZINE (APRESOLINE) 10 MG tablet Take 1 tablet (10 mg total) by mouth every 8 (eight) hours.  90 tablet  0  . levothyroxine (SYNTHROID, LEVOTHROID) 150 MCG tablet Take 150 mcg by mouth daily before breakfast.      . loperamide (ANTI-DIARRHEAL) 2 MG tablet Take 2 mg by mouth as needed for diarrhea or loose stools (TAKE ONE TABLET WITH EACH LOOSE STOOL AS NEEDED FOR DIARRHEA, DO NOT EXCEED 8 DOSES IN 24 HOURS-HASN'T TAKEN BUT IS ON MAR AS STANDING ORDER).      . magnesium hydroxide (MILK OF MAGNESIA) 400 MG/5ML suspension Take  20 mLs by mouth daily as needed for constipation (AT BEDTIME AS NEEDED-HASN'T TAKEN, IS ON MAR AS STANDING ORDER).      . memantine (NAMENDA) 10 MG tablet Take 1 tablet (10 mg total) by mouth 2 (two) times daily.  30 tablet  0  . omeprazole (PRILOSEC) 20 MG capsule Take 20 mg by mouth daily.        . polycarbophil (FIBERCON) 625 MG tablet Take 625 mg by mouth daily.      . potassium chloride SA (K-DUR,KLOR-CON) 20 MEQ tablet Take 20 mEq by mouth 2 (two) times daily.      . predniSONE (DELTASONE) 10 MG tablet Take 10 mg by mouth every other day. Take one tablet every other day alternating with 15mg       . predniSONE (DELTASONE) 5 MG tablet Take 15 mg by mouth daily. TAKE 3 TABLETS (15MG  TOTAL) EVERY OTHER DAY ALTERNATING WITH THE 10MG  DOSE      . saccharomyces boulardii (FLORASTOR) 250 MG capsule Take 250 mg by mouth 2 (two) times daily.      . sertraline (ZOLOFT) 50 MG tablet Take 50 mg by mouth daily.        . simvastatin (ZOCOR) 20 MG tablet Take 20 mg by mouth at bedtime.        . sulfamethoxazole-trimethoprim (BACTRIM,SEPTRA) 400-80 MG per tablet Take 1 tablet by mouth at bedtime.      . dipyridamole-aspirin (AGGRENOX) 25-200 MG per 12 hr capsule Take 1 capsule by mouth 2 (two) times daily.        . Multiple Vitamin (THERA/BETA-CAROTENE) TABS Take by mouth daily.      . vancomycin (VANCOCIN) 1000 MG injection        No facility-administered encounter medications on file as of 01/30/2013.  :   Review of Systems  HENT:       Snoring   Cardiovascular:       Swelling in legs  Musculoskeletal:       Joint  Pain   Neurological: Positive for dizziness and weakness.       Memory loss  Hematological: Bruises/bleeds easily.  Psychiatric/Behavioral:       Decreased energy  Objective:  Neurologic Exam  Physical Exam Physical Examination:   Filed Vitals:   01/30/13 1217  BP: 104/55  Pulse: 78  Temp: 98.1 F (36.7 C)    General Examination: The patient is a very pleasant 79 y.o.  female in no acute distress. She is calm and cooperative with the exam. She denies Auditory Hallucinations and Visual Hallucinations. She is situated in her wheelchair and is on oxygen at 2 L per minute via nasal cannula. She is very pleasant and conversant.She is frail appearing.   HEENT: Normocephalic, atraumatic, pupils are equal, round and reactive to light and accommodation. Funduscopic exam is normal with sharp disc margins noted. Extraocular tracking shows mild saccadic breakdown without nystagmus noted. Hearing is impaired with bilateral hearing aids in place. Face is symmetric with no facial masking and normal facial sensation.  there is mild some puffiness around her eyes bilaterally. There is mild redness and she keeps rubbing her right eye complaining of itching. She does have dry eyes bilaterally. There is no lip, neck or jaw tremor. Neck is Supple with intact passive ROM. There are no carotid bruits on auscultation. Oropharynx exam reveals mild mouth dryness. No significant airway crowding is noted. Mallampati is class II. Tongue protrudes centrally and palate elevates symmetrically.    Chest: is clear to auscultation without wheezing, rhonchi or crackles noted.  Heart: sounds are regular and normal without murmurs, rubs or gallops noted.   Abdomen: is soft, non-tender and non-distended with normal bowel sounds appreciated on auscultation.  Extremities: There is 1+ pitting edema in the distal lower extremities bilaterally. She is wearing compression hose and bilaterally.   Skin: is warm and dry with no trophic changes noted, But she has frail-appearing skin and multiple old-appearing bruises..  Musculoskeletal: exam reveals no obvious joint deformities, tenderness or joint swelling or erythema.  Neurologically:  Mental status: The patient is awake and alert, paying good  attention. She is able to partially provide the history. Her daughter  provides details. She is oriented to: place  and situation. Her memory, attention, language and knowledge are impaired mildly. There is no aphasia, agnosia, apraxia or anomia. There is a mild degree of bradyphrenia. Speech is mildly hypophonic with no dysarthria noted. Mood is congruent and affect is normal.   Cranial nerves are as described above under HEENT exam. In addition, shoulder shrug is normal with equal shoulder height noted.  Motor exam: thin bulk, and overall 4/5 strength is noted. Tone is not rigid with absence of cogwheeling in the extremities. There is overall no bradykinesia. There is no drift or rebound. There is no tremor. Romberg was not tested and the patient did not bring her walker today. I did not get her up to stand or walk for me today. Reflexes are trace in the upper extremities and absent in the lower extremities. Fine motor skills are mildly impaired.    Cerebellar testing shows no dysmetria or intention tremor. There is no truncal ataxia.   Sensory exam is  impaired in the lower extremities with decreased sensation.     Assessment and Plan:   Assessment and Plan:  In summary, BREYONA SWANDER is a very pleasant 79 y.o.-year old female with a  complex history of recurrent TIAs, history of stroke, diagnosis of NPH in the past, and diagnosis of giant cell arteritis now with overall deconditioning, recurrent sepsis and recurrent hospitalizations including recent history of bacterial meningitis, urosepsis, pneumonia in the recent past, and CHF exacerbation. She was followed  by Dr. love and I explained to them that we will recheck her sedimentation rate today and based on her numbers we can hopefully reduce her steroid dose again. She is currently using 15 mg alternating with 10 mg daily. Because she is on chronic steroid therapy she is susceptible to infections and I talked to her and her daughter about this. This will be an ongoing problem. Unfortunately she has become more and more deconditioned and frail and has been  diagnosed with failure to thrive as well. I will make sure that she follows up with [place] in the next couple of months or at the most 3 months. Our nurse, Andrey Campanile, will call her back for her followup appointment. Since Dr. Imagene Gurney retirement we are making sure that his patients are taking care of with the past possible tear and my associates and I have different subspecialties and we want to make sure the patient's are from old into the right direction and taken care of by the physician that would be the most suited for each individual case. The patient and her daughter demonstrated understanding  and voiced agreement.

## 2013-01-31 LAB — CBC WITH DIFFERENTIAL
Basos: 0 % (ref 0–3)
Eos: 1 % (ref 0–5)
HCT: 31.7 % — ABNORMAL LOW (ref 34.0–46.6)
Hemoglobin: 10.4 g/dL — ABNORMAL LOW (ref 11.1–15.9)
Lymphocytes Absolute: 1.2 10*3/uL (ref 0.7–3.1)
MCH: 28 pg (ref 26.6–33.0)
MCV: 85 fL (ref 79–97)
Monocytes Absolute: 0.4 10*3/uL (ref 0.1–0.9)
Monocytes: 5 % (ref 4–12)
RBC: 3.72 x10E6/uL — ABNORMAL LOW (ref 3.77–5.28)
WBC: 8.7 10*3/uL (ref 3.4–10.8)

## 2013-01-31 LAB — COMPREHENSIVE METABOLIC PANEL
ALT: 12 IU/L (ref 0–32)
Albumin/Globulin Ratio: 1.2 (ref 1.1–2.5)
Albumin: 3.6 g/dL (ref 3.5–4.7)
Alkaline Phosphatase: 91 IU/L (ref 39–117)
BUN/Creatinine Ratio: 10 — ABNORMAL LOW (ref 11–26)
Chloride: 102 mmol/L (ref 96–108)
GFR calc Af Amer: 48 mL/min/{1.73_m2} — ABNORMAL LOW (ref >59)
GFR calc non Af Amer: 42 mL/min/{1.73_m2} — ABNORMAL LOW (ref >59)
Potassium: 4.5 mmol/L (ref 3.5–5.2)
Total Bilirubin: 0.2 mg/dL (ref 0.0–1.2)

## 2013-01-31 LAB — SEDIMENTATION RATE: Sed Rate: 72 mm/hr — ABNORMAL HIGH (ref 0–40)

## 2013-01-31 NOTE — Progress Notes (Signed)
Quick Note:  Please call patient and inform her that her sedimentation rate or ESR is still elevated at 72. I would not recommend changing her prednisone dose at this time and continue with the current dosing. She also has evidence of mild decrease in hemoglobin and this indicates anemia. This is not much off from where she was previously. Her creatinine level is 1.22 which indicates mild kidney impairment. Please encourage her to drink enough water. She may need monitoring of her kidney function. Please have her discuss this with her primary care physician as well. ______

## 2013-02-01 NOTE — Progress Notes (Signed)
Quick Note:  Spoke with patients daughter [name]) and relayed the results of the blood work and Dr. Teofilo Pod advice / recommendation's. Ms. Ernest Pine understood and requested that I fax a copy to her mother's PCP at [facility], Dr Cathey Endow. I contacted [facility] [place] and got Dr Misty Stanley contact information: Ph. [phone], Fax [phone]. Results are being faxed to Dr Cathey Endow. ______

## 2013-04-13 ENCOUNTER — Emergency Department (HOSPITAL_COMMUNITY)
Admission: EM | Admit: 2013-04-13 | Discharge: 2013-04-13 | Disposition: A | Discharge status: Skilled Nursing Facility | Payer: Medicare Other | Attending: Emergency Medicine | Admitting: Emergency Medicine

## 2013-04-13 ENCOUNTER — Encounter (HOSPITAL_COMMUNITY): Payer: Self-pay

## 2013-04-13 DIAGNOSIS — M316 Other giant cell arteritis: Secondary | ICD-10-CM | POA: Insufficient documentation

## 2013-04-13 DIAGNOSIS — C189 Malignant neoplasm of colon, unspecified: Secondary | ICD-10-CM | POA: Insufficient documentation

## 2013-04-13 DIAGNOSIS — R39198 Other difficulties with micturition: Secondary | ICD-10-CM | POA: Insufficient documentation

## 2013-04-13 DIAGNOSIS — Z79899 Other long term (current) drug therapy: Secondary | ICD-10-CM | POA: Insufficient documentation

## 2013-04-13 DIAGNOSIS — F0390 Unspecified dementia without behavioral disturbance: Secondary | ICD-10-CM | POA: Insufficient documentation

## 2013-04-13 DIAGNOSIS — I129 Hypertensive chronic kidney disease with stage 1 through stage 4 chronic kidney disease, or unspecified chronic kidney disease: Secondary | ICD-10-CM | POA: Insufficient documentation

## 2013-04-13 DIAGNOSIS — I5030 Unspecified diastolic (congestive) heart failure: Secondary | ICD-10-CM | POA: Insufficient documentation

## 2013-04-13 DIAGNOSIS — N390 Urinary tract infection, site not specified: Secondary | ICD-10-CM | POA: Insufficient documentation

## 2013-04-13 DIAGNOSIS — IMO0002 Reserved for concepts with insufficient information to code with codable children: Secondary | ICD-10-CM | POA: Insufficient documentation

## 2013-04-13 DIAGNOSIS — E785 Hyperlipidemia, unspecified: Secondary | ICD-10-CM | POA: Insufficient documentation

## 2013-04-13 DIAGNOSIS — Z9981 Dependence on supplemental oxygen: Secondary | ICD-10-CM | POA: Insufficient documentation

## 2013-04-13 DIAGNOSIS — E039 Hypothyroidism, unspecified: Secondary | ICD-10-CM | POA: Insufficient documentation

## 2013-04-13 DIAGNOSIS — N289 Disorder of kidney and ureter, unspecified: Secondary | ICD-10-CM

## 2013-04-13 DIAGNOSIS — I119 Hypertensive heart disease without heart failure: Secondary | ICD-10-CM | POA: Insufficient documentation

## 2013-04-13 DIAGNOSIS — R3915 Urgency of urination: Secondary | ICD-10-CM | POA: Insufficient documentation

## 2013-04-13 DIAGNOSIS — R109 Unspecified abdominal pain: Secondary | ICD-10-CM | POA: Insufficient documentation

## 2013-04-13 DIAGNOSIS — G609 Hereditary and idiopathic neuropathy, unspecified: Secondary | ICD-10-CM | POA: Insufficient documentation

## 2013-04-13 DIAGNOSIS — D649 Anemia, unspecified: Secondary | ICD-10-CM | POA: Insufficient documentation

## 2013-04-13 DIAGNOSIS — R609 Edema, unspecified: Secondary | ICD-10-CM | POA: Insufficient documentation

## 2013-04-13 DIAGNOSIS — N189 Chronic kidney disease, unspecified: Secondary | ICD-10-CM | POA: Insufficient documentation

## 2013-04-13 LAB — CBC WITH DIFFERENTIAL/PLATELET
Basophils Relative: 0 % (ref 0–1)
Eosinophils Absolute: 0.1 10*3/uL (ref 0.0–0.7)
Eosinophils Relative: 1 % (ref 0–5)
Hemoglobin: 8.9 g/dL — ABNORMAL LOW (ref 12.0–15.0)
Lymphs Abs: 2.7 10*3/uL (ref 0.7–4.0)
MCH: 25.4 pg — ABNORMAL LOW (ref 26.0–34.0)
MCHC: 30.7 g/dL (ref 30.0–36.0)
MCV: 82.9 fL (ref 78.0–100.0)
Monocytes Absolute: 0.7 10*3/uL (ref 0.1–1.0)
Monocytes Relative: 9 % (ref 3–12)
Neutrophils Relative %: 58 % (ref 43–77)
RBC: 3.5 MIL/uL — ABNORMAL LOW (ref 3.87–5.11)

## 2013-04-13 LAB — URINALYSIS, ROUTINE W REFLEX MICROSCOPIC
Bilirubin Urine: NEGATIVE
Hgb urine dipstick: NEGATIVE
Ketones, ur: NEGATIVE mg/dL
Nitrite: NEGATIVE
Specific Gravity, Urine: 1.019 (ref 1.005–1.030)
pH: 5.5 (ref 5.0–8.0)

## 2013-04-13 LAB — COMPREHENSIVE METABOLIC PANEL
Albumin: 2.8 g/dL — ABNORMAL LOW (ref 3.5–5.2)
Alkaline Phosphatase: 71 U/L (ref 39–117)
BUN: 22 mg/dL (ref 6–23)
Calcium: 8.9 mg/dL (ref 8.4–10.5)
Creatinine, Ser: 1.41 mg/dL — ABNORMAL HIGH (ref 0.50–1.10)
GFR calc Af Amer: 39 mL/min — ABNORMAL LOW (ref >90)
Glucose, Bld: 82 mg/dL (ref 70–99)
Total Protein: 6.6 g/dL (ref 6.0–8.3)

## 2013-04-13 LAB — URINE MICROSCOPIC-ADD ON

## 2013-04-13 MED ORDER — CEFIXIME 400 MG PO TABS
400.0000 mg | ORAL_TABLET | Freq: Once | ORAL | Status: AC
  Administered 2013-04-13: 400 mg via ORAL
  Filled 2013-04-13: qty 1

## 2013-04-13 MED ORDER — ONDANSETRON 8 MG PO TBDP
8.0000 mg | ORAL_TABLET | Freq: Once | ORAL | Status: AC
  Administered 2013-04-13: 8 mg via ORAL

## 2013-04-13 MED ORDER — CEFIXIME 400 MG PO TABS: 400.0000 mg | ORAL_TABLET | Freq: Once | ORAL | Status: DC

## 2013-04-13 MED ORDER — ONDANSETRON 8 MG PO TBDP: 8.0000 mg | ORAL_TABLET | Freq: Three times a day (TID) | ORAL | Status: DC | PRN

## 2013-04-13 MED ORDER — PHENAZOPYRIDINE HCL 200 MG PO TABS
200.0000 mg | ORAL_TABLET | Freq: Three times a day (TID) | ORAL | Status: DC
  Administered 2013-04-13: 200 mg via ORAL

## 2013-04-13 MED ORDER — PHENAZOPYRIDINE HCL 200 MG PO TABS: 200.0000 mg | ORAL_TABLET | Freq: Three times a day (TID) | ORAL | Status: DC

## 2013-04-13 NOTE — ED Notes (Signed)
ZOX:[id number]<AV> Expected date:<BR> Expected time:<BR> Means of arrival:<BR> Comments:<BR> 83yoF dysuria

## 2013-04-13 NOTE — ED Notes (Signed)
Per EMS: Pt from [facility] with c/o painful urination that started Wednesday and has gotten worse since. Pain located in groin area. Denies abdominal pain. Hx of chronic UTI. Pt reports nausea. Given zofran 4mg  IV.

## 2013-04-13 NOTE — ED Provider Notes (Addendum)
History    CSN: [id number] Arrival date & time 04/13/13  0121  First MD Initiated Contact with Patient 04/13/13 0132     Chief Complaint  Patient presents with  . Dysuria   (Consider location/radiation/quality/duration/timing/severity/associated sxs/prior Treatment) The history is provided by the patient and the EMS personnel.  79 yo female presents from nursing facility with complaint of painful burning in her genital region, initially just with urination but now all the time, sensation of urgency, decreased stream.  No fevers, no n/v/d.  Sxs similar to prior UTIS  Past Medical History  Diagnosis Date  . Peripheral neuropathy   . Hypertension   . TIA (transient ischemic attack)   . Elevated sed rate   . Temporal arteritis     chronic steroids  . Hypothyroidism   . Colon cancer     colon  . History of hernia repair   . S/P rotator cuff surgery     right  . Diastolic CHF   . Cardiomegaly - hypertensive   . Chronic anemia   . Chronic steroid use   . Dementia   . Shortness of breath   . Hyperlipidemia   . CHF (congestive heart failure)   . Cardiomyopathy due to hypertension   . Dependence on supplemental oxygen   . Chronic UTI   . Dementia   . Giant cell arteritis    Past Surgical History  Procedure Laterality Date  . Tonsillectomy  1939  . Colectomy  1987  . Abdominal hysterectomy  1974  . Appendectomy  1947  . Hernia repair  1990  . Cholecystectomy  2000  . Rotator cuff repair  2004  . Right temporal artery biopsy  10/1998, 10/2009  . Right cataract extraction  2011  . Eye surgery    . Lumbar puncture  10/07/2004   Family History  Problem Relation Age of Onset  . Dementia Mother   . Heart attack Father    History  Substance Use Topics  . Smoking status: Never Smoker   . Smokeless tobacco: Never Used  . Alcohol Use: No   OB History   Grav Para Term Preterm Abortions TAB SAB Ect Mult Living                 Review of Systems  All other systems  reviewed and are negative.    Allergies  Apap; Ciprofloxacin; Codeine; Demerol; Morphine and related; Nitrofurantoin monohyd macro; Pregabalin; and Septra  Home Medications   Current Outpatient Rx  Name  Route  Sig  Dispense  Refill  . amLODipine (NORVASC) 5 MG tablet   Oral   Take 5 mg by mouth every morning.         . Cranberry 250 MG CAPS   Oral   Take 500 mg by mouth 2 (two) times daily.         Marland Kitchen dipyridamole-aspirin (AGGRENOX) 25-200 MG per 12 hr capsule   Oral   Take 1 capsule by mouth 2 (two) times daily.           Marland Kitchen donepezil (ARICEPT) 10 MG tablet   Oral   Take 10 mg by mouth at bedtime.         . furosemide (LASIX) 40 MG tablet   Oral   Take 40 mg by mouth every morning.         . hydrALAZINE (APRESOLINE) 10 MG tablet   Oral   Take 1 tablet (10 mg total) by mouth every 8 (eight)  hours.   90 tablet   0   . levothyroxine (SYNTHROID, LEVOTHROID) 150 MCG tablet   Oral   Take 150 mcg by mouth daily before breakfast.         . Memantine HCl ER (NAMENDA XR) 28 MG CP24   Oral   Take 1 capsule by mouth every morning.         . Multiple Vitamin (THERA/BETA-CAROTENE) TABS   Oral   Take 1 tablet by mouth every morning.          Marland Kitchen omeprazole (PRILOSEC) 20 MG capsule   Oral   Take 20 mg by mouth every morning.          . polycarbophil (FIBERCON) 625 MG tablet   Oral   Take 625 mg by mouth every morning.          . potassium chloride SA (K-DUR,KLOR-CON) 20 MEQ tablet   Oral   Take 20 mEq by mouth 2 (two) times daily.         . predniSONE (DELTASONE) 5 MG tablet   Oral   Take 10-15 mg by mouth See admin instructions. Alternate between 10mg  and 15mg  every other day         . saccharomyces boulardii (FLORASTOR) 250 MG capsule   Oral   Take 250 mg by mouth 2 (two) times daily.         . sertraline (ZOLOFT) 50 MG tablet   Oral   Take 50 mg by mouth every morning.          . sulfamethoxazole-trimethoprim (BACTRIM,SEPTRA)  400-80 MG per tablet   Oral   Take 1 tablet by mouth at bedtime.          SpO2 96% Physical Exam  Nursing note and vitals reviewed. Constitutional: She is oriented to person, place, and time. She appears well-developed and well-nourished.  HENT:  Head: Normocephalic and atraumatic.  Nose: Nose normal.  Mouth/Throat: Oropharynx is clear and moist.  Eyes: Conjunctivae and EOM are normal. Pupils are equal, round, and reactive to light.  Neck: Normal range of motion. Neck supple. No JVD present. No tracheal deviation present. No thyromegaly present.  Cardiovascular: Normal rate, regular rhythm, normal heart sounds and intact distal pulses.  Exam reveals no gallop and no friction rub.   No murmur heard. Pulmonary/Chest: Effort normal and breath sounds normal. No stridor. No respiratory distress. She has no wheezes. She has no rales. She exhibits no tenderness.  Abdominal: Soft. Bowel sounds are normal. She exhibits no distension and no mass. There is tenderness (suprapubic tenderness). There is no rebound and no guarding.  Musculoskeletal: Normal range of motion. She exhibits edema. She exhibits no tenderness.  Lymphadenopathy:    She has no cervical adenopathy.  Neurological: She is alert and oriented to person, place, and time. She exhibits normal muscle tone. Coordination normal.  Skin: Skin is dry. No rash noted. No erythema. No pallor.  Psychiatric: She has a normal mood and affect. Her behavior is normal. Judgment and thought content normal.    ED Course  Procedures (including critical care time) Labs Reviewed  URINALYSIS, ROUTINE W REFLEX MICROSCOPIC - Abnormal; Notable for the following:    APPearance CLOUDY (*)    Leukocytes, UA LARGE (*)    All other components within normal limits  CBC WITH DIFFERENTIAL - Abnormal; Notable for the following:    RBC 3.50 (*)    Hemoglobin 8.9 (*)    HCT 29.0 (*)    [facility]  25.4 (*)    All other components within normal limits  COMPREHENSIVE  METABOLIC PANEL - Abnormal; Notable for the following:    Creatinine, Ser 1.41 (*)    Albumin 2.8 (*)    Total Bilirubin 0.2 (*)    GFR calc non Af Amer 34 (*)    GFR calc Af Amer 39 (*)    All other components within normal limits  URINE MICROSCOPIC-ADD ON - Abnormal; Notable for the following:    Bacteria, UA MANY (*)    All other components within normal limits  URINE CULTURE  ua:  Leuk esterase large, WBC TNTC, Bacteria many CBC: Wbc 8.3 HGB 8.9 HCT 29.0 PLT 264  Na 137 K 3.7 CL 98 CO2 28 Glu 82 BUN 22 CR 0.2   No results found. 1. Urinary tract infection   2. Anemia   3. Renal insufficiency     MDM  79 yo female with 3 days of dysuria, no fever, vomiting.  H/o frequent utis, on bactrim for prophylaxis.  Urine with clear infection.  Will treat with pyridium and suprax  Olivia Mackie, MD 04/13/13 3086  Olivia Mackie, MD 04/13/13 [phone]

## 2013-04-17 LAB — URINE CULTURE

## 2013-04-18 NOTE — ED Notes (Signed)
+   urine No treatment needed per Lisa Sanders 

## 2013-04-19 ENCOUNTER — Ambulatory Visit (INDEPENDENT_AMBULATORY_CARE_PROVIDER_SITE_OTHER): Payer: Medicare Other | Admitting: Neurology

## 2013-04-19 ENCOUNTER — Encounter: Payer: Self-pay | Admitting: Neurology

## 2013-04-19 VITALS — BP 154/64 | HR 64 | Ht 68.5 in | Wt 204.0 lb

## 2013-04-19 DIAGNOSIS — E785 Hyperlipidemia, unspecified: Secondary | ICD-10-CM

## 2013-04-19 DIAGNOSIS — R5383 Other fatigue: Secondary | ICD-10-CM

## 2013-04-19 DIAGNOSIS — F329 Major depressive disorder, single episode, unspecified: Secondary | ICD-10-CM

## 2013-04-19 DIAGNOSIS — F0390 Unspecified dementia without behavioral disturbance: Secondary | ICD-10-CM

## 2013-04-19 DIAGNOSIS — Z13820 Encounter for screening for osteoporosis: Secondary | ICD-10-CM

## 2013-04-19 DIAGNOSIS — N390 Urinary tract infection, site not specified: Secondary | ICD-10-CM

## 2013-04-19 DIAGNOSIS — R4182 Altered mental status, unspecified: Secondary | ICD-10-CM

## 2013-04-19 DIAGNOSIS — I119 Hypertensive heart disease without heart failure: Secondary | ICD-10-CM

## 2013-04-19 DIAGNOSIS — R531 Weakness: Secondary | ICD-10-CM

## 2013-04-19 DIAGNOSIS — E039 Hypothyroidism, unspecified: Secondary | ICD-10-CM

## 2013-04-19 DIAGNOSIS — IMO0002 Reserved for concepts with insufficient information to code with codable children: Secondary | ICD-10-CM | POA: Insufficient documentation

## 2013-04-19 NOTE — Progress Notes (Signed)
Subjective:    Patient ID: [name] is a 79 y.o. female.   [name] is a 79 year old right-handed woman who presents for followup consultation of her multiple neurological issues including normal pressure hydrocephalus, giant cell arteritis, history of TIA and stroke, confusion and memory loss. She is accompanied by her daughter, [name], today.  She was seen by Dr. Frances Furbish in May 2014.She previously followed with Dr. Fayrene Fearing love and was last seen by him on 10/02/2012, at which time he changed her prednisone regimen to 15 mg alternating with 10 mg every other day.   Her falls assessment we'll score at the time is 24. He also ordered a CBC and CMP as well as ESR. He called them back with the blood results, and while the CMP and CBC were essentially unremarkable, her ESR was 60, which is not unusual for her. She has a complex medical history including hypertension, colon cancer, hyperlipidemia, stroke, thyroid disease, depression, anxiety, giant cell arteritis, peripheral neuropathy, TIA, peripheral vascular disease, recurrent urinary tract infections.    I reviewed Dr. Imagene Gurney prior notes and the patient's records and below is a summary of that review:  She lives at [facility] and has a gait disorder requiring a walker, history of right-sided stroke in February 2000, temporal arteritis with positive left temporal artery biopsy in March 2000.   MRI in 2006 showed extensive pontine, periventricular, and subcortical white matter changes as well as ventriculomegaly and MRI.  She has had confusional episodes and memory loss. She had a  negative temporal artery biopsy in February 2011.  She was started on prednisone 60 mg daily because of continued headaches in March 2011. They were tapered down to 1 mg every other day, but in December 2012 her ESR was 114.   MRI of the brain showed severe periventricular subcortical and pontine chronic small vessel ischemic disease. She was  restarted on high-dose prednisone again, which was slowly tapered to 15 mg daily. In June of 2013, she was admitted to [facility] with urinary tract infection and was placed on oxygen,  which she uses now as needed.    She was diagnosed with congestive heart failure. She was readmitted to [facility] in December 2013 with sepsis.  She was admitted to [facility] on 12/11/2012 to 12/13/2012 due to altered mental status and was again diagnosed with sepsis with toxic encephalopathy.   She had head CT on 12/11/2012 showed no acute abnormality.Stable atrophy, extensive chronic small vessel white matter ischemic changes in both cerebral hemispheres and extensive chronic small vessel ischemic changes in the pons .  Prior to this she was admitted to [facility] on 3/11 through 12/09/2012 for sepsis secondary to Escherichia coli UTI and C. difficile colitis.  She was re-admitted on 4/30-5/1 for CHF exacerbation, now she is on O2 24/7 at 2 lpm since 2/14.   CXR on 4/30: The no substantial change. Cardiomegaly with vascular congestion and persistent retrocardiac opacity which may be related to atelectasis/infiltrate or scarring. There appears to be a persistent tiny left pleural effusion associated.   In January 2014, she had a seizure in the context of bacterial meningitis. She was in the hosptal from 1/13 to 10/12/12 for that. She was treated for 10 days with broad-spectrum antibiotics, including vanc/rocephin/ampicillin.  She currently denies any chest pain, shortness of breath, but does complain of eye itching.  UPDATE July 25ht 2014:   She is doing well, she is taking  prednisone 10mg , alternating with 5mg , no significant side effect.  Most recent ESR in May was 72. She complains of bilateral feet paresthesia.   Her Past Medical History Is Significant For: Past Medical History  Diagnosis Date  . Peripheral neuropathy   . Hypertension   . TIA (transient ischemic  attack)   . Elevated sed rate   . Temporal arteritis   . Hypothyroidism   . Colon cancer   . History of hernia repair   . S/P rotator cuff surgery   . Diastolic CHF   . Cardiomegaly - hypertensive   . Chronic anemia   . Chronic steroid use   . Dementia   . Shortness of breath   . Hyperlipidemia   . CHF (congestive heart failure)   . Cardiomyopathy due to hypertension   . Dependence on supplemental oxygen   . Chronic UTI   . Dementia   . Giant cell arteritis     Her Past Surgical History Is Significant For: Past Surgical History  Procedure Laterality Date  . Tonsillectomy  1939  . Colectomy  1987  . Abdominal hysterectomy  1974  . Appendectomy  1947  . Hernia repair  1990  . Cholecystectomy  2000  . Rotator cuff repair  2004  . Right temporal artery biopsy  10/1998, 10/2009  . Right cataract extraction  2011  . Eye surgery    . Lumbar puncture  10/07/2004    Her Family History Is Significant For: Family History  Problem Relation Age of Onset  . Dementia Mother   . Heart attack Father     Social History Is Significant For: History   Social History  . Marital Status: Widowed    Spouse Name: N/A    Number of Children: 2  . Years of Education: N/A   Occupational History  . RETIRED    Social History Main Topics  . Smoking status: Never Smoker   . Smokeless tobacco: Never Used  . Alcohol Use: No  . Drug Use: No  . Sexually Active: Not Currently   Other Topics Concern  . None   Social History Narrative   Widowed.  Has lived with her daughter for the past 2.5 years, but most recently d/c'd to ALF/SNF.  Ambulates with a walker.     Allergies Are:  Allergies  Allergen Reactions  . Apap (Acetaminophen)     Reaction unknown  . Ciprofloxacin Nausea And Vomiting  . Codeine Other (See Comments)    halucinations  . Demerol Other (See Comments)    halucinations  . Morphine And Related Other (See Comments)    halucinations  . Nitrofurantoin Monohyd Macro  Other (See Comments)    Per MAR  . Pregabalin Other (See Comments)    unknown  . Septra (Bactrim) Other (See Comments)    Unknown cant tolerate DS  :   Her Current Medications Are:  Outpatient Encounter Prescriptions as of 04/19/2013  Medication Sig Dispense Refill  . amLODipine (NORVASC) 5 MG tablet Take 5 mg by mouth every morning.      . cefixime (SUPRAX) 400 MG tablet Take 1 tablet (400 mg total) by mouth once.  10 tablet  0  . Cranberry 250 MG CAPS Take 500 mg by mouth 2 (two) times daily.      Marland Kitchen dipyridamole-aspirin (AGGRENOX) 25-200 MG per 12 hr capsule Take 1 capsule by mouth 2 (two) times daily.        Marland Kitchen donepezil (ARICEPT) 10 MG tablet Take 10  mg by mouth at bedtime.      . ferrous sulfate 325 (65 FE) MG tablet Take 325 mg by mouth daily with breakfast.      . Fiber CAPS Take by mouth daily.      . furosemide (LASIX) 40 MG tablet Take 40 mg by mouth every morning.      . hydrALAZINE (APRESOLINE) 10 MG tablet Take 1 tablet (10 mg total) by mouth every 8 (eight) hours.  90 tablet  0  . levothyroxine (SYNTHROID, LEVOTHROID) 150 MCG tablet Take 150 mcg by mouth daily before breakfast.      . Memantine HCl ER (NAMENDA XR) 28 MG CP24 Take 1 capsule by mouth every morning.      . Multiple Vitamin (THERA/BETA-CAROTENE) TABS Take 1 tablet by mouth every morning.       Marland Kitchen omeprazole (PRILOSEC) 20 MG capsule Take 20 mg by mouth every morning.       . ondansetron (ZOFRAN-ODT) 8 MG disintegrating tablet Take 1 tablet (8 mg total) by mouth every 8 (eight) hours as needed for nausea.  20 tablet  0  . phenazopyridine (PYRIDIUM) 200 MG tablet Take 1 tablet (200 mg total) by mouth 3 (three) times daily with meals.  10 tablet  0  . polycarbophil (FIBERCON) 625 MG tablet Take 625 mg by mouth every morning.       . potassium chloride SA (K-DUR,KLOR-CON) 20 MEQ tablet Take 20 mEq by mouth 2 (two) times daily.      . predniSONE (DELTASONE) 5 MG tablet Take 10-15 mg by mouth See admin instructions.  Alternate between 10mg  and 15mg  every other day      . saccharomyces boulardii (FLORASTOR) 250 MG capsule Take 250 mg by mouth 2 (two) times daily.      . sertraline (ZOLOFT) 50 MG tablet Take 50 mg by mouth every morning.       . simvastatin (ZOCOR) 20 MG tablet Take 20 mg by mouth daily.      Marland Kitchen sulfamethoxazole-trimethoprim (BACTRIM,SEPTRA) 400-80 MG per tablet Take 1 tablet by mouth at bedtime.       No facility-administered encounter medications on file as of 04/19/2013.  :   Review of Systems  HENT:       Snoring   Cardiovascular:       Swelling in legs  Musculoskeletal:       Joint  Pain   Neurological: Positive for dizziness and weakness.       Memory loss  Hematological: Bruises/bleeds easily.  Psychiatric/Behavioral:       Decreased energy    Objective:  Neurologic Exam  Physical Exam Physical Examination:   Filed Vitals:   04/19/13 1537  BP: 154/64  Pulse: 64   PHYSICAL EXAMINATOINS:  Generalized: In no acute distress  Neck: Supple, no carotid bruits   Cardiac: Regular rate rhythm  Pulmonary: Clear to auscultation bilaterally  Musculoskeletal: No deformity  Neurological examination  Mentation: Alert oriented to time, place, history taking, and causual conversation, MMSE 26/30, she is not oriented to date, missed 1/3 recall, hs difficulty copy figure.  Cranial nerve II-XII: Pupils were equal round reactive to light extraocular movements were full, visual field were full on confrontational test. facial sensation and strength were normal. hearing was intact to finger rubbing bilaterally. Uvula tongue midline.  head turning and shoulder shrug and were normal and symmetric.Tongue protrusion into cheek strength was normal.  Motor: normal tone, bulk and strength.  Sensory: mildly length dependent decreased fine touch,  pinprick to distal leg.  Coordination: Normal finger to nose, heel-to-shin bilaterally there was no truncal ataxia  Gait: need to push up to  get up from seated position, cautious, wide based, mildly unsteady gait.   Romberg signs: Negative  Deep tendon reflexes: Brachioradialis 1/1, biceps 1/1, triceps 1/1, patellar 1/1, Achilles absent, plantar responses were flexor bilaterally.   Assessment and Plan:   Assessment and Plan:   [name] is a very pleasant 79 y.o.-year old female with a  complex history of recurrent TIAs, history of stroke, diagnosis of NPH in the past, and diagnosis of giant cell arteritis now with overall deconditioning, recurrent sepsis and recurrent hospitalizations including recent history of bacterial meningitis, urosepsis, pneumonia in the recent past, and CHF exacerbation.   1.  Laboratory evaluation continued to demonstrate elevated ESR, 72, she is to continue to take current prednisone 10 mg alternating between 5 mg. 2.  Bone density scan 3. RTC in 6 months

## 2013-05-30 ENCOUNTER — Emergency Department (HOSPITAL_COMMUNITY): Payer: Medicare Other

## 2013-05-30 ENCOUNTER — Inpatient Hospital Stay (HOSPITAL_COMMUNITY)
Admission: EM | Admit: 2013-05-30 | Discharge: 2013-06-01 | DRG: 689 | Disposition: A | Discharge status: Home Health | Payer: Medicare Other | Attending: Internal Medicine | Admitting: Internal Medicine

## 2013-05-30 ENCOUNTER — Inpatient Hospital Stay (HOSPITAL_COMMUNITY): Payer: Medicare Other

## 2013-05-30 ENCOUNTER — Other Ambulatory Visit (HOSPITAL_COMMUNITY): Payer: Medicare Other

## 2013-05-30 ENCOUNTER — Encounter (HOSPITAL_COMMUNITY): Payer: Self-pay | Admitting: *Deleted

## 2013-05-30 DIAGNOSIS — F329 Major depressive disorder, single episode, unspecified: Secondary | ICD-10-CM

## 2013-05-30 DIAGNOSIS — I509 Heart failure, unspecified: Secondary | ICD-10-CM

## 2013-05-30 DIAGNOSIS — G92 Toxic encephalopathy: Secondary | ICD-10-CM

## 2013-05-30 DIAGNOSIS — I119 Hypertensive heart disease without heart failure: Secondary | ICD-10-CM

## 2013-05-30 DIAGNOSIS — J9600 Acute respiratory failure, unspecified whether with hypoxia or hypercapnia: Secondary | ICD-10-CM

## 2013-05-30 DIAGNOSIS — R935 Abnormal findings on diagnostic imaging of other abdominal regions, including retroperitoneum: Secondary | ICD-10-CM

## 2013-05-30 DIAGNOSIS — B9620 Unspecified Escherichia coli [E. coli] as the cause of diseases classified elsewhere: Secondary | ICD-10-CM

## 2013-05-30 DIAGNOSIS — G459 Transient cerebral ischemic attack, unspecified: Secondary | ICD-10-CM

## 2013-05-30 DIAGNOSIS — Z9049 Acquired absence of other specified parts of digestive tract: Secondary | ICD-10-CM

## 2013-05-30 DIAGNOSIS — F32A Depression, unspecified: Secondary | ICD-10-CM

## 2013-05-30 DIAGNOSIS — Z66 Do not resuscitate: Secondary | ICD-10-CM | POA: Diagnosis present

## 2013-05-30 DIAGNOSIS — G061 Intraspinal abscess and granuloma: Secondary | ICD-10-CM | POA: Diagnosis present

## 2013-05-30 DIAGNOSIS — C189 Malignant neoplasm of colon, unspecified: Secondary | ICD-10-CM

## 2013-05-30 DIAGNOSIS — E876 Hypokalemia: Secondary | ICD-10-CM

## 2013-05-30 DIAGNOSIS — Z79899 Other long term (current) drug therapy: Secondary | ICD-10-CM

## 2013-05-30 DIAGNOSIS — I2489 Other forms of acute ischemic heart disease: Secondary | ICD-10-CM

## 2013-05-30 DIAGNOSIS — E039 Hypothyroidism, unspecified: Secondary | ICD-10-CM

## 2013-05-30 DIAGNOSIS — E872 Acidosis, unspecified: Secondary | ICD-10-CM

## 2013-05-30 DIAGNOSIS — R600 Localized edema: Secondary | ICD-10-CM

## 2013-05-30 DIAGNOSIS — L0291 Cutaneous abscess, unspecified: Secondary | ICD-10-CM

## 2013-05-30 DIAGNOSIS — IMO0002 Reserved for concepts with insufficient information to code with codable children: Secondary | ICD-10-CM

## 2013-05-30 DIAGNOSIS — M4620 Osteomyelitis of vertebra, site unspecified: Secondary | ICD-10-CM | POA: Diagnosis present

## 2013-05-30 DIAGNOSIS — R0603 Acute respiratory distress: Secondary | ICD-10-CM

## 2013-05-30 DIAGNOSIS — I248 Other forms of acute ischemic heart disease: Secondary | ICD-10-CM

## 2013-05-30 DIAGNOSIS — G009 Bacterial meningitis, unspecified: Secondary | ICD-10-CM

## 2013-05-30 DIAGNOSIS — J189 Pneumonia, unspecified organism: Secondary | ICD-10-CM

## 2013-05-30 DIAGNOSIS — G609 Hereditary and idiopathic neuropathy, unspecified: Secondary | ICD-10-CM | POA: Diagnosis present

## 2013-05-30 DIAGNOSIS — N179 Acute kidney failure, unspecified: Secondary | ICD-10-CM

## 2013-05-30 DIAGNOSIS — Z8673 Personal history of transient ischemic attack (TIA), and cerebral infarction without residual deficits: Secondary | ICD-10-CM

## 2013-05-30 DIAGNOSIS — Z85038 Personal history of other malignant neoplasm of large intestine: Secondary | ICD-10-CM

## 2013-05-30 DIAGNOSIS — R4182 Altered mental status, unspecified: Secondary | ICD-10-CM

## 2013-05-30 DIAGNOSIS — A419 Sepsis, unspecified organism: Secondary | ICD-10-CM

## 2013-05-30 DIAGNOSIS — Z8719 Personal history of other diseases of the digestive system: Secondary | ICD-10-CM

## 2013-05-30 DIAGNOSIS — R6520 Severe sepsis without septic shock: Secondary | ICD-10-CM

## 2013-05-30 DIAGNOSIS — N390 Urinary tract infection, site not specified: Principal | ICD-10-CM

## 2013-05-30 DIAGNOSIS — I110 Hypertensive heart disease with heart failure: Secondary | ICD-10-CM | POA: Diagnosis present

## 2013-05-30 DIAGNOSIS — Z9981 Dependence on supplemental oxygen: Secondary | ICD-10-CM

## 2013-05-30 DIAGNOSIS — R0902 Hypoxemia: Secondary | ICD-10-CM

## 2013-05-30 DIAGNOSIS — A0472 Enterocolitis due to Clostridium difficile, not specified as recurrent: Secondary | ICD-10-CM

## 2013-05-30 DIAGNOSIS — D72829 Elevated white blood cell count, unspecified: Secondary | ICD-10-CM

## 2013-05-30 DIAGNOSIS — I4891 Unspecified atrial fibrillation: Secondary | ICD-10-CM

## 2013-05-30 DIAGNOSIS — R079 Chest pain, unspecified: Secondary | ICD-10-CM

## 2013-05-30 DIAGNOSIS — F0390 Unspecified dementia without behavioral disturbance: Secondary | ICD-10-CM

## 2013-05-30 DIAGNOSIS — R159 Full incontinence of feces: Secondary | ICD-10-CM | POA: Diagnosis present

## 2013-05-30 DIAGNOSIS — I5032 Chronic diastolic (congestive) heart failure: Secondary | ICD-10-CM

## 2013-05-30 DIAGNOSIS — K921 Melena: Secondary | ICD-10-CM

## 2013-05-30 DIAGNOSIS — E785 Hyperlipidemia, unspecified: Secondary | ICD-10-CM

## 2013-05-30 DIAGNOSIS — I10 Essential (primary) hypertension: Secondary | ICD-10-CM

## 2013-05-30 DIAGNOSIS — E860 Dehydration: Secondary | ICD-10-CM

## 2013-05-30 DIAGNOSIS — I43 Cardiomyopathy in diseases classified elsewhere: Secondary | ICD-10-CM | POA: Diagnosis present

## 2013-05-30 DIAGNOSIS — G629 Polyneuropathy, unspecified: Secondary | ICD-10-CM

## 2013-05-30 DIAGNOSIS — I5030 Unspecified diastolic (congestive) heart failure: Secondary | ICD-10-CM

## 2013-05-30 DIAGNOSIS — R627 Adult failure to thrive: Secondary | ICD-10-CM

## 2013-05-30 DIAGNOSIS — E8770 Fluid overload, unspecified: Secondary | ICD-10-CM

## 2013-05-30 DIAGNOSIS — D649 Anemia, unspecified: Secondary | ICD-10-CM

## 2013-05-30 DIAGNOSIS — G929 Unspecified toxic encephalopathy: Secondary | ICD-10-CM

## 2013-05-30 DIAGNOSIS — Z9889 Other specified postprocedural states: Secondary | ICD-10-CM

## 2013-05-30 DIAGNOSIS — R609 Edema, unspecified: Secondary | ICD-10-CM

## 2013-05-30 DIAGNOSIS — M316 Other giant cell arteritis: Secondary | ICD-10-CM

## 2013-05-30 DIAGNOSIS — I5033 Acute on chronic diastolic (congestive) heart failure: Secondary | ICD-10-CM

## 2013-05-30 DIAGNOSIS — R112 Nausea with vomiting, unspecified: Secondary | ICD-10-CM

## 2013-05-30 DIAGNOSIS — R32 Unspecified urinary incontinence: Secondary | ICD-10-CM | POA: Diagnosis present

## 2013-05-30 DIAGNOSIS — R001 Bradycardia, unspecified: Secondary | ICD-10-CM

## 2013-05-30 DIAGNOSIS — R531 Weakness: Secondary | ICD-10-CM

## 2013-05-30 LAB — URINALYSIS, ROUTINE W REFLEX MICROSCOPIC
Ketones, ur: NEGATIVE mg/dL
Nitrite: NEGATIVE
Protein, ur: NEGATIVE mg/dL
Urobilinogen, UA: 0.2 mg/dL (ref 0.0–1.0)

## 2013-05-30 LAB — CBC WITH DIFFERENTIAL/PLATELET
Eosinophils Absolute: 0 10*3/uL (ref 0.0–0.7)
HCT: 33.6 % — ABNORMAL LOW (ref 36.0–46.0)
Hemoglobin: 10.6 g/dL — ABNORMAL LOW (ref 12.0–15.0)
Lymphs Abs: 3.4 10*3/uL (ref 0.7–4.0)
MCH: 26.7 pg (ref 26.0–34.0)
Monocytes Absolute: 0.8 10*3/uL (ref 0.1–1.0)
Monocytes Relative: 7 % (ref 3–12)
Neutro Abs: 7.6 10*3/uL (ref 1.7–7.7)
Neutrophils Relative %: 64 % (ref 43–77)
RBC: 3.97 MIL/uL (ref 3.87–5.11)

## 2013-05-30 LAB — COMPREHENSIVE METABOLIC PANEL
Alkaline Phosphatase: 72 U/L (ref 39–117)
BUN: 16 mg/dL (ref 6–23)
Chloride: 99 mEq/L (ref 96–112)
Creatinine, Ser: 1.35 mg/dL — ABNORMAL HIGH (ref 0.50–1.10)
GFR calc Af Amer: 41 mL/min — ABNORMAL LOW (ref >90)
Glucose, Bld: 91 mg/dL (ref 70–99)
Potassium: 3.7 mEq/L (ref 3.5–5.1)
Total Bilirubin: 0.2 mg/dL — ABNORMAL LOW (ref 0.3–1.2)

## 2013-05-30 LAB — MRSA PCR SCREENING: MRSA by PCR: NEGATIVE

## 2013-05-30 MED ORDER — IOHEXOL 300 MG/ML  SOLN
50.0000 mL | Freq: Once | INTRAMUSCULAR | Status: AC | PRN
  Administered 2013-05-30: 50 mL via ORAL

## 2013-05-30 MED ORDER — DONEPEZIL HCL 10 MG PO TABS
10.0000 mg | ORAL_TABLET | Freq: Every day | ORAL | Status: DC
  Administered 2013-05-30 – 2013-05-31 (×2): 10 mg via ORAL
  Filled 2013-05-30 (×4): qty 1

## 2013-05-30 MED ORDER — ENOXAPARIN SODIUM 40 MG/0.4ML ~~LOC~~ SOLN
40.0000 mg | SUBCUTANEOUS | Status: DC
  Filled 2013-05-30: qty 0.4

## 2013-05-30 MED ORDER — SODIUM CHLORIDE 0.9 % IV BOLUS (SEPSIS)
500.0000 mL | Freq: Once | INTRAVENOUS | Status: AC
  Administered 2013-05-30: 500 mL via INTRAVENOUS

## 2013-05-30 MED ORDER — MEMANTINE HCL ER 28 MG PO CP24: 1.0000 | ORAL_CAPSULE | Freq: Every morning | ORAL | Status: DC

## 2013-05-30 MED ORDER — LEVOTHYROXINE SODIUM 150 MCG PO TABS
150.0000 ug | ORAL_TABLET | Freq: Every day | ORAL | Status: DC
  Administered 2013-05-31 – 2013-06-01 (×2): 150 ug via ORAL
  Filled 2013-05-30 (×4): qty 1

## 2013-05-30 MED ORDER — ONDANSETRON HCL 4 MG/2ML IJ SOLN: 4.0000 mg | Freq: Four times a day (QID) | INTRAMUSCULAR | Status: DC | PRN

## 2013-05-30 MED ORDER — LOPERAMIDE HCL 2 MG PO CAPS: 2.0000 mg | ORAL_CAPSULE | Freq: Four times a day (QID) | ORAL | Status: DC | PRN

## 2013-05-30 MED ORDER — FERROUS SULFATE 325 (65 FE) MG PO TABS
325.0000 mg | ORAL_TABLET | Freq: Every day | ORAL | Status: DC
  Administered 2013-05-31 – 2013-06-01 (×2): 325 mg via ORAL
  Filled 2013-05-30 (×4): qty 1

## 2013-05-30 MED ORDER — ONDANSETRON HCL 4 MG PO TABS: 4.0000 mg | ORAL_TABLET | Freq: Four times a day (QID) | ORAL | Status: DC | PRN

## 2013-05-30 MED ORDER — PIPERACILLIN-TAZOBACTAM 3.375 G IVPB
3.3750 g | Freq: Three times a day (TID) | INTRAVENOUS | Status: DC
  Administered 2013-05-30 – 2013-06-01 (×5): 3.375 g via INTRAVENOUS
  Filled 2013-05-30 (×7): qty 50

## 2013-05-30 MED ORDER — ASPIRIN-DIPYRIDAMOLE ER 25-200 MG PO CP12
1.0000 | ORAL_CAPSULE | Freq: Two times a day (BID) | ORAL | Status: DC
  Administered 2013-05-31 – 2013-06-01 (×3): 1 via ORAL
  Filled 2013-05-30 (×5): qty 1

## 2013-05-30 MED ORDER — PIPERACILLIN-TAZOBACTAM 3.375 G IVPB
3.3750 g | Freq: Once | INTRAVENOUS | Status: AC
  Administered 2013-05-30: 3.375 g via INTRAVENOUS
  Filled 2013-05-30: qty 50

## 2013-05-30 MED ORDER — SODIUM CHLORIDE 0.9 % IJ SOLN
3.0000 mL | Freq: Two times a day (BID) | INTRAMUSCULAR | Status: DC
  Administered 2013-05-30: 14:00:00 via INTRAVENOUS
  Administered 2013-05-30 – 2013-06-01 (×2): 3 mL via INTRAVENOUS

## 2013-05-30 MED ORDER — PANTOPRAZOLE SODIUM 40 MG PO TBEC
40.0000 mg | DELAYED_RELEASE_TABLET | Freq: Every day | ORAL | Status: DC
  Administered 2013-05-30 – 2013-06-01 (×3): 40 mg via ORAL
  Filled 2013-05-30 (×4): qty 1

## 2013-05-30 MED ORDER — MAGNESIUM HYDROXIDE 400 MG/5ML PO SUSP: 30.0000 mL | Freq: Every day | ORAL | Status: DC | PRN

## 2013-05-30 MED ORDER — SERTRALINE HCL 50 MG PO TABS
50.0000 mg | ORAL_TABLET | Freq: Every morning | ORAL | Status: DC
  Administered 2013-05-30 – 2013-06-01 (×3): 50 mg via ORAL
  Filled 2013-05-30 (×4): qty 1

## 2013-05-30 MED ORDER — FIBER PO CAPS: ORAL_CAPSULE | Freq: Every day | ORAL | Status: DC

## 2013-05-30 MED ORDER — ENOXAPARIN SODIUM 40 MG/0.4ML ~~LOC~~ SOLN
40.0000 mg | SUBCUTANEOUS | Status: DC
  Administered 2013-05-31 – 2013-06-01 (×2): 40 mg via SUBCUTANEOUS
  Filled 2013-05-30 (×3): qty 0.4

## 2013-05-30 MED ORDER — AMLODIPINE BESYLATE 5 MG PO TABS
5.0000 mg | ORAL_TABLET | Freq: Every morning | ORAL | Status: DC
  Administered 2013-05-30 – 2013-06-01 (×3): 5 mg via ORAL
  Filled 2013-05-30 (×4): qty 1

## 2013-05-30 MED ORDER — ACETAMINOPHEN 500 MG PO TABS: 500.0000 mg | ORAL_TABLET | Freq: Four times a day (QID) | ORAL | Status: DC | PRN

## 2013-05-30 MED ORDER — HYDRALAZINE HCL 10 MG PO TABS
10.0000 mg | ORAL_TABLET | Freq: Three times a day (TID) | ORAL | Status: DC
  Administered 2013-05-30 – 2013-06-01 (×7): 10 mg via ORAL
  Filled 2013-05-30 (×10): qty 1

## 2013-05-30 MED ORDER — SACCHAROMYCES BOULARDII 250 MG PO CAPS
250.0000 mg | ORAL_CAPSULE | Freq: Two times a day (BID) | ORAL | Status: DC
  Administered 2013-05-30 – 2013-06-01 (×5): 250 mg via ORAL
  Filled 2013-05-30 (×7): qty 1

## 2013-05-30 MED ORDER — PREDNISONE 10 MG PO TABS: 10.0000 mg | ORAL_TABLET | ORAL | Status: DC

## 2013-05-30 MED ORDER — PREDNISONE 10 MG PO TABS
10.0000 mg | ORAL_TABLET | ORAL | Status: DC
  Administered 2013-05-30 – 2013-06-01 (×2): 10 mg via ORAL
  Filled 2013-05-30 (×3): qty 1

## 2013-05-30 MED ORDER — POTASSIUM CHLORIDE CRYS ER 20 MEQ PO TBCR
20.0000 meq | EXTENDED_RELEASE_TABLET | Freq: Two times a day (BID) | ORAL | Status: DC
  Administered 2013-05-30 – 2013-06-01 (×5): 20 meq via ORAL
  Filled 2013-05-30 (×7): qty 1

## 2013-05-30 MED ORDER — VANCOMYCIN HCL 10 G IV SOLR
1250.0000 mg | Freq: Once | INTRAVENOUS | Status: AC
  Administered 2013-05-30: 1250 mg via INTRAVENOUS
  Filled 2013-05-30: qty 1250

## 2013-05-30 MED ORDER — FUROSEMIDE 40 MG PO TABS
40.0000 mg | ORAL_TABLET | Freq: Every day | ORAL | Status: DC
  Administered 2013-05-31 – 2013-06-01 (×2): 40 mg via ORAL
  Filled 2013-05-30 (×3): qty 1

## 2013-05-30 MED ORDER — ACETAMINOPHEN 325 MG PO TABS
650.0000 mg | ORAL_TABLET | Freq: Once | ORAL | Status: AC
  Administered 2013-05-30: 650 mg via ORAL
  Filled 2013-05-30: qty 2

## 2013-05-30 MED ORDER — VANCOMYCIN HCL 10 G IV SOLR
1250.0000 mg | INTRAVENOUS | Status: DC
  Administered 2013-05-31: 1250 mg via INTRAVENOUS
  Filled 2013-05-30: qty 1250

## 2013-05-30 MED ORDER — GUAIFENESIN 100 MG/5ML PO SOLN: 10.0000 mL | Freq: Four times a day (QID) | ORAL | Status: DC | PRN

## 2013-05-30 MED ORDER — PREDNISONE 5 MG PO TABS
15.0000 mg | ORAL_TABLET | ORAL | Status: DC
  Administered 2013-05-31: 08:00:00 15 mg via ORAL
  Filled 2013-05-30 (×3): qty 1

## 2013-05-30 MED ORDER — ASPIRIN-DIPYRIDAMOLE ER 25-200 MG PO CP12
1.0000 | ORAL_CAPSULE | Freq: Two times a day (BID) | ORAL | Status: DC
  Filled 2013-05-30 (×2): qty 1

## 2013-05-30 MED ORDER — MEMANTINE HCL 10 MG PO TABS
10.0000 mg | ORAL_TABLET | Freq: Two times a day (BID) | ORAL | Status: DC
  Administered 2013-05-30 – 2013-06-01 (×5): 10 mg via ORAL
  Filled 2013-05-30 (×7): qty 1

## 2013-05-30 NOTE — Procedures (Signed)
US guided aspiration of the fluid collection in the lower back soft tissues.  30 ml of cloudy yellow fluid was removed.  No immediate complication.

## 2013-05-30 NOTE — H&P (Signed)
Referring Physician: Dr. Donna Bernard HPI: [name] is an 79 y.o. female who presented for vaginal pain with a history of chronic UTI and history of urinary and stool incontinence. Patient underwent CT revealed a loculated fluid collection within the subcutaneous fat of the lower back just to the left of midline at the level of L3-4. Finding is worrisome for infection. Patient with elevated wbc. She denies any back pain, she denies any fever or chills. She denies any chest pain or shortness of breath. She denies any recent fall or trauma to her back. She denies any back surgeries. She does take aggrenox for history of TIA, last dose 05/29/13. She denies any active bleeding, blood in her stool or urine.    Past Medical History:  Past Medical History  Diagnosis Date  . Peripheral neuropathy   . Hypertension   . TIA (transient ischemic attack)   . Elevated sed rate   . Temporal arteritis     chronic steroids  . Hypothyroidism   . Colon cancer     colon  . History of hernia repair   . S/P rotator cuff surgery     right  . Diastolic CHF   . Cardiomegaly - hypertensive   . Chronic anemia   . Chronic steroid use   . Dementia   . Shortness of breath   . Hyperlipidemia   . CHF (congestive heart failure)   . Cardiomyopathy due to hypertension   . Dependence on supplemental oxygen   . Chronic UTI   . Dementia   . Giant cell arteritis     Past Surgical History:  Past Surgical History  Procedure Laterality Date  . Tonsillectomy  1939  . Colectomy  1987  . Abdominal hysterectomy  1974  . Appendectomy  1947  . Hernia repair  1990  . Cholecystectomy  2000  . Rotator cuff repair  2004  . Right temporal artery biopsy  10/1998, 10/2009  . Right cataract extraction  2011  . Eye surgery    . Lumbar puncture  10/07/2004    Family History:  Family History  Problem Relation Age of Onset  . Dementia Mother   . Heart attack Father     Social History:  reports that she has never smoked.  She has never used smokeless tobacco. She reports that she does not drink alcohol or use illicit drugs.  Allergies:  Allergies  Allergen Reactions  . Apap [Acetaminophen]     Reaction unknown  . Ciprofloxacin Nausea And Vomiting  . Codeine Other (See Comments)    halucinations  . Demerol Other (See Comments)    halucinations  . Morphine And Related Other (See Comments)    halucinations  . Nitrofurantoin Monohyd Macro Other (See Comments)    Per MAR  . Pregabalin Other (See Comments)    unknown  . Septra [Bactrim] Other (See Comments)    Unknown cant tolerate DS       Medication List    ASK your doctor about these medications       acetaminophen 500 MG tablet  Commonly known as:  TYLENOL  Take 500 mg by mouth every 6 (six) hours as needed for pain.     amLODipine 5 MG tablet  Commonly known as:  NORVASC  Take 5 mg by mouth every morning.     Cranberry 250 MG Caps  Take 500 mg by mouth 2 (two) times daily.     dipyridamole-aspirin 200-25 MG per 12 hr capsule  Commonly known as:  AGGRENOX  Take 1 capsule by mouth 2 (two) times daily.     donepezil 10 MG tablet  Commonly known as:  ARICEPT  Take 10 mg by mouth at bedtime.     ferrous sulfate 325 (65 FE) MG tablet  Take 325 mg by mouth daily with breakfast.     Fiber Caps  Take by mouth daily.     furosemide 40 MG tablet  Commonly known as:  LASIX  Take 40 mg by mouth every morning.     guaiFENesin 100 MG/5ML Soln  Commonly known as:  ROBITUSSIN  Take 10 mLs by mouth every 6 (six) hours as needed.     hydrALAZINE 10 MG tablet  Commonly known as:  APRESOLINE  Take 1 tablet (10 mg total) by mouth every 8 (eight) hours.     levothyroxine 150 MCG tablet  Commonly known as:  SYNTHROID, LEVOTHROID  Take 150 mcg by mouth daily before breakfast.     loperamide 2 MG capsule  Commonly known as:  IMODIUM  Take 2 mg by mouth 4 (four) times daily as needed for diarrhea or loose stools.     magnesium hydroxide 400  MG/5ML suspension  Commonly known as:  MILK OF MAGNESIA  Take 30 mLs by mouth daily as needed for constipation.     NAMENDA XR 28 MG Cp24  Generic drug:  Memantine HCl ER  Take 1 capsule by mouth every morning.     omeprazole 20 MG capsule  Commonly known as:  PRILOSEC  Take 20 mg by mouth every morning.     ondansetron 8 MG disintegrating tablet  Commonly known as:  ZOFRAN-ODT  Take 1 tablet (8 mg total) by mouth every 8 (eight) hours as needed for nausea.     potassium chloride SA 20 MEQ tablet  Commonly known as:  K-DUR,KLOR-CON  Take 20 mEq by mouth 2 (two) times daily.     predniSONE 5 MG tablet  Commonly known as:  DELTASONE  Take 10-15 mg by mouth See admin instructions. Alternate between 10mg  and 15mg  every other day     saccharomyces boulardii 250 MG capsule  Commonly known as:  FLORASTOR  Take 250 mg by mouth 2 (two) times daily.     sertraline 50 MG tablet  Commonly known as:  ZOLOFT  Take 50 mg by mouth every morning.     sulfamethoxazole-trimethoprim 400-80 MG per tablet  Commonly known as:  BACTRIM,SEPTRA  Take 1 tablet by mouth at bedtime.     THERA/BETA-CAROTENE Tabs  Take 1 tablet by mouth every morning.        Please HPI for pertinent positives, otherwise complete 10 system ROS negative.  Physical Exam: BP 150/47  Pulse 68  Temp(Src) 97.4 F (36.3 C) (Oral)  Resp 21  Ht 5' 8.5" (1.74 m)  Wt 203 lb 14.8 oz (92.5 kg)  BMI 30.55 kg/m2  SpO2 99% Body mass index is 30.55 kg/(m^2).   General Appearance:  Alert, cooperative, no distress, appears stated age  Head:  Normocephalic, without obvious abnormality, atraumatic  Lungs:   Clear to auscultation bilaterally, no w/r/r, respirations unlabored without use of accessory muscles.  Chest Wall:  No tenderness or deformity  Heart:  Regular rate and rhythm, S1, S2 normal, no murmur, rub or gallop.  Abdomen:   Soft, non-tender, non distended. Back exam demonstrates no erythematous or edematous area  seen. No tenderness present on palpation of back.  Extremities: Extremities normal, atraumatic, no  cyanosis. 1+ edema bilaterally.  Pulses: 1+ and symmetric  Neurologic: Normal affect, no gross deficits.   Results for orders placed during the hospital encounter of 05/30/13 (from the past 48 hour(s))  CBC WITH DIFFERENTIAL     Status: Abnormal   Collection Time    05/30/13  3:28 AM      Result Value Range   WBC 11.8 (*) 4.0 - 10.5 K/uL   RBC 3.97  3.87 - 5.11 MIL/uL   Hemoglobin 10.6 (*) 12.0 - 15.0 g/dL   HCT 16.1 (*) 09.6 - 04.5 %   MCV 84.6  78.0 - 100.0 fL   MCH 26.7  26.0 - 34.0 pg   MCHC 31.5  30.0 - 36.0 g/dL   RDW 40.9 (*) 81.1 - 91.4 %   Platelets 251  150 - 400 K/uL   Neutrophils Relative % 64  43 - 77 %   Neutro Abs 7.6  1.7 - 7.7 K/uL   Lymphocytes Relative 29  12 - 46 %   Lymphs Abs 3.4  0.7 - 4.0 K/uL   Monocytes Relative 7  3 - 12 %   Monocytes Absolute 0.8  0.1 - 1.0 K/uL   Eosinophils Relative 0  0 - 5 %   Eosinophils Absolute 0.0  0.0 - 0.7 K/uL   Basophils Relative 0  0 - 1 %   Basophils Absolute 0.0  0.0 - 0.1 K/uL  COMPREHENSIVE METABOLIC PANEL     Status: Abnormal   Collection Time    05/30/13  3:28 AM      Result Value Range   Sodium 137  135 - 145 mEq/L   Potassium 3.7  3.5 - 5.1 mEq/L   Chloride 99  96 - 112 mEq/L   CO2 25  19 - 32 mEq/L   Glucose, Bld 91  70 - 99 mg/dL   BUN 16  6 - 23 mg/dL   Creatinine, Ser 7.82 (*) 0.50 - 1.10 mg/dL   Calcium 8.7  8.4 - 95.6 mg/dL   Total Protein 6.5  6.0 - 8.3 g/dL   Albumin 2.8 (*) 3.5 - 5.2 g/dL   AST 16  0 - 37 U/L   ALT 13  0 - 35 U/L   Alkaline Phosphatase 72  39 - 117 U/L   Total Bilirubin 0.2 (*) 0.3 - 1.2 mg/dL   GFR calc non Af Amer 36 (*) >90 mL/min   GFR calc Af Amer 41 (*) >90 mL/min   Comment: (NOTE)     The eGFR has been calculated using the CKD EPI equation.     This calculation has not been validated in all clinical situations.     eGFR's persistently <90 mL/min signify possible Chronic  Kidney     Disease.  CG4 I-STAT (LACTIC ACID)     Status: Abnormal   Collection Time    05/30/13  3:36 AM      Result Value Range   Lactic Acid, Venous 3.50 (*) 0.5 - 2.2 mmol/L  URINALYSIS, ROUTINE W REFLEX MICROSCOPIC     Status: Abnormal   Collection Time    05/30/13  6:53 AM      Result Value Range   Color, Urine YELLOW  YELLOW   APPearance CLEAR  CLEAR   Specific Gravity, Urine 1.008  1.005 - 1.030   pH 6.5  5.0 - 8.0   Glucose, UA NEGATIVE  NEGATIVE mg/dL   Hgb urine dipstick NEGATIVE  NEGATIVE   Bilirubin Urine NEGATIVE  NEGATIVE   Ketones, ur NEGATIVE  NEGATIVE mg/dL   Protein, ur NEGATIVE  NEGATIVE mg/dL   Urobilinogen, UA 0.2  0.0 - 1.0 mg/dL   Nitrite NEGATIVE  NEGATIVE   Leukocytes, UA MODERATE (*) NEGATIVE  URINE MICROSCOPIC-ADD ON     Status: None   Collection Time    05/30/13  6:53 AM      Result Value Range   WBC, UA 7-10  <3 WBC/hpf  MRSA PCR SCREENING     Status: None   Collection Time    05/30/13  9:54 AM      Result Value Range   MRSA by PCR NEGATIVE  NEGATIVE   Comment:            The GeneXpert MRSA Assay (FDA     approved for NASAL specimens     only), is one component of a     comprehensive MRSA colonization     surveillance program. It is not     intended to diagnose MRSA     infection nor to guide or     monitor treatment for     MRSA infections.   Ct Abdomen Pelvis Wo Contrast  05/30/2013   *RADIOLOGY REPORT*  Clinical Data: Lower abdominal pain, vaginal pain since 05/29/2013, elevated white blood cell count.  CT ABDOMEN AND PELVIS WITHOUT CONTRAST  Technique:  Multidetector CT imaging of the abdomen and pelvis was performed following the standard protocol without intravenous contrast.  Comparison: Prior radiograph from 09/02/2012  Findings: Dependent atelectasis is present within the lung bases bilaterally.  Prominent extrapleural fat is seen on the left.  No pleural effusion appreciated.  There is cardiomegaly with prominent mitral valvular and  aortic valvular calcifications.  No pericardial effusion.  The liver is unremarkable.  The gallbladder is surgically absent. There is no biliary ductal dilatation.  The spleen and adrenal glands are within normal limits.  There is fatty infiltration of the pancreas.  The kidneys are atrophic in appearance bilaterally.  No nephrolithiasis or hydronephrosis.  No significant perinephric fat stranding.  Evaluation for renal mass is limited on this noncontrast examination.  There is no evidence of bowel obstruction.  No abnormal wall thickening enhancement is seen about the bowels to suggest underlying inflammation. A Richter-type periumbilical hernia containing a portion of the transverse colon is present.  No associated obstruction.  Circumferential wall thickening is seen within the bladder with multiple small associated diverticula.  There is mild associated fat stranding.  The vagina itself is grossly normal.  No free air or fluid is identified.  Loculated fluid collection measuring 6.5 x 3.4 x 7.1 cm is present within the subcutaneous fat just overlying the left paravertebral musculature just to the left of midline, at the level of the L3-4. This collection lies approximately 2 cm deep to the skin.  There is associated soft tissue stranding within the subcutaneous fat.  This finding is worrisome for infection.  No acute osseous abnormalities are identified.  Degenerative disc desiccation is seen at L4-5 and L5 S1.  Prominent atherosclerotic calcifications are seen throughout the intra-abdominal aorta and its branch vessels.  IMPRESSION:  1.  Loculated fluid collection within the subcutaneous fat of the lower back just to the left of midline at the level of L3-4. Finding is worrisome for infection. This collection would be amendable to image guided percutaneous drainage.  2.  Mild circumferential wall thickening within the bladder with multiple bladder diverticula.  Correlation with urinalysis to evaluate for  possible cystitis is recommended.  3. Richter-type paraumbilical hernia containing a portion of the transverse colon without associated obstruction.  4.  Hiatal hernia.  5.  Cardiomegaly.  Critical Value/emergent results were called by telephone at the time of interpretation on 05/30/2013 at 06:45 a.m. to Dr. Pricilla Loveless by Dr. Hoy Morn, who verbally acknowledged these results.   Original Report Authenticated By: Rise Mu, M.D.    Assessment/Plan CT 9/4 revealed loculated fluid collection within the subcutaneous fat of the  lower back just to the left of midline at the level of L3-4. Leukocytosis 11.8 on chronic steroids for temporal arteritis.  History of TIA, on chronic aggrenox last dose 9/3 History of chronic UTI with new vaginal pain, on IV antibiotics. Request for image guided fluid collection aspiration. Labs reviewed, patient is NPO, lovenox and aggrenox have been held. Risks and Benefits discussed with the patient and her family. All of the patient's questions were answered, patient is agreeable to proceed. Consent signed and in chart.   Pattricia Boss D PA-C 05/30/2013, 1:13 PM

## 2013-05-30 NOTE — Consult Note (Signed)
Patient seen and examined.  Agree with PA's note.  

## 2013-05-30 NOTE — Consult Note (Signed)
Reason for Consult: Left paravertebral fluid collection Referring Physician: Lunabelle Oatley is an 79 y.o. female.  HPI: 79 y/o in SNF with hx of TIA's, some dementia, recurrent UTI's with sepsis, presents with severe pain in the vaginal area.  She says it's only been there for a day.  It hurts all the time. Labs show an elevated WBC, elevated creatinine, Lactic acid of 3.5, probable UTI.  A CT scan was also obtained and this shows a loculated fluid collection 6.5 x 3.4 x 7.1 cm, over the left para vertebral musculature just to the left of the midline at the level of L3-4. Radiology recommened this be drained by IR and noted it was worrisome for an infection.  She has chronic incontinence of both bowels and bladder, she is on chronic steroids for temporal arteritis.  We were ask to see in consult for possible paravertebral fluid collection and infection.  Past Medical History  Diagnosis Date  . Peripheral neuropathy   . Hypertension   . TIA (transient ischemic attack)   . Elevated sed rate   . Temporal arteritis     chronic steroids  . Hypothyroidism   . Colon cancer     colon  . History of hernia repair   . S/P rotator cuff surgery     right  . Diastolic CHF   . Cardiomegaly - hypertensive   . Chronic anemia   . Chronic steroid use   . Dementia   . Shortness of breath   . Hyperlipidemia   . CHF (congestive heart failure)   . Cardiomyopathy due to hypertension   . Dependence on supplemental oxygen   . Chronic UTI   . Dementia   . Giant cell arteritis     Past Surgical History  Procedure Laterality Date  . Tonsillectomy  1939  . Colectomy  1987  . Abdominal hysterectomy  1974  . Appendectomy  1947  . Hernia repair  1990  . Cholecystectomy  2000  . Rotator cuff repair  2004  . Right temporal artery biopsy  10/1998, 10/2009  . Right cataract extraction  2011  . Eye surgery    . Lumbar puncture  10/07/2004    Family History  Problem Relation Age of Onset   . Dementia Mother   . Heart attack Father     Social History:  reports that she has never smoked. She has never used smokeless tobacco. She reports that she does not drink alcohol or use illicit drugs.  Allergies:  Allergies  Allergen Reactions  . Apap [Acetaminophen]     Reaction unknown  . Ciprofloxacin Nausea And Vomiting  . Codeine Other (See Comments)    halucinations  . Demerol Other (See Comments)    halucinations  . Morphine And Related Other (See Comments)    halucinations  . Nitrofurantoin Monohyd Macro Other (See Comments)    Per MAR  . Pregabalin Other (See Comments)    unknown  . Septra [Bactrim] Other (See Comments)    Unknown cant tolerate DS    Medications:   Scheduled:  Continuous: . vancomycin 1,250 mg (05/30/13 0840)   PRN: Anti-infectives   Start     Dose/Rate Route Frequency Ordered Stop   05/30/13 0715  vancomycin (VANCOCIN) 1,250 mg in sodium chloride 0.9 % 250 mL IVPB     1,250 mg 166.7 mL/hr over 90 Minutes Intravenous  Once 05/30/13 0700     05/30/13 0715  piperacillin-tazobactam (ZOSYN) IVPB 3.375 g  3.375 g 100 mL/hr over 30 Minutes Intravenous  Once 05/30/13 0700 05/30/13 0840      Results for orders placed during the hospital encounter of 05/30/13 (from the past 48 hour(s))  CBC WITH DIFFERENTIAL     Status: Abnormal   Collection Time    05/30/13  3:28 AM      Result Value Range   WBC 11.8 (*) 4.0 - 10.5 K/uL   RBC 3.97  3.87 - 5.11 MIL/uL   Hemoglobin 10.6 (*) 12.0 - 15.0 g/dL   HCT 96.0 (*) 45.4 - 09.8 %   MCV 84.6  78.0 - 100.0 fL   MCH 26.7  26.0 - 34.0 pg   MCHC 31.5  30.0 - 36.0 g/dL   RDW 11.9 (*) 14.7 - 82.9 %   Platelets 251  150 - 400 K/uL   Neutrophils Relative % 64  43 - 77 %   Neutro Abs 7.6  1.7 - 7.7 K/uL   Lymphocytes Relative 29  12 - 46 %   Lymphs Abs 3.4  0.7 - 4.0 K/uL   Monocytes Relative 7  3 - 12 %   Monocytes Absolute 0.8  0.1 - 1.0 K/uL   Eosinophils Relative 0  0 - 5 %   Eosinophils Absolute  0.0  0.0 - 0.7 K/uL   Basophils Relative 0  0 - 1 %   Basophils Absolute 0.0  0.0 - 0.1 K/uL  COMPREHENSIVE METABOLIC PANEL     Status: Abnormal   Collection Time    05/30/13  3:28 AM      Result Value Range   Sodium 137  135 - 145 mEq/L   Potassium 3.7  3.5 - 5.1 mEq/L   Chloride 99  96 - 112 mEq/L   CO2 25  19 - 32 mEq/L   Glucose, Bld 91  70 - 99 mg/dL   BUN 16  6 - 23 mg/dL   Creatinine, Ser 5.62 (*) 0.50 - 1.10 mg/dL   Calcium 8.7  8.4 - 13.0 mg/dL   Total Protein 6.5  6.0 - 8.3 g/dL   Albumin 2.8 (*) 3.5 - 5.2 g/dL   AST 16  0 - 37 U/L   ALT 13  0 - 35 U/L   Alkaline Phosphatase 72  39 - 117 U/L   Total Bilirubin 0.2 (*) 0.3 - 1.2 mg/dL   GFR calc non Af Amer 36 (*) >90 mL/min   GFR calc Af Amer 41 (*) >90 mL/min   Comment: (NOTE)     The eGFR has been calculated using the CKD EPI equation.     This calculation has not been validated in all clinical situations.     eGFR's persistently <90 mL/min signify possible Chronic Kidney     Disease.  CG4 I-STAT (LACTIC ACID)     Status: Abnormal   Collection Time    05/30/13  3:36 AM      Result Value Range   Lactic Acid, Venous 3.50 (*) 0.5 - 2.2 mmol/L  URINALYSIS, ROUTINE W REFLEX MICROSCOPIC     Status: Abnormal   Collection Time    05/30/13  6:53 AM      Result Value Range   Color, Urine YELLOW  YELLOW   APPearance CLEAR  CLEAR   Specific Gravity, Urine 1.008  1.005 - 1.030   pH 6.5  5.0 - 8.0   Glucose, UA NEGATIVE  NEGATIVE mg/dL   Hgb urine dipstick NEGATIVE  NEGATIVE   Bilirubin Urine NEGATIVE  NEGATIVE   Ketones, ur NEGATIVE  NEGATIVE mg/dL   Protein, ur NEGATIVE  NEGATIVE mg/dL   Urobilinogen, UA 0.2  0.0 - 1.0 mg/dL   Nitrite NEGATIVE  NEGATIVE   Leukocytes, UA MODERATE (*) NEGATIVE  URINE MICROSCOPIC-ADD ON     Status: None   Collection Time    05/30/13  6:53 AM      Result Value Range   WBC, UA 7-10  <3 WBC/hpf    Ct Abdomen Pelvis Wo Contrast  05/30/2013   *RADIOLOGY REPORT*  Clinical Data: Lower  abdominal pain, vaginal pain since 05/29/2013, elevated white blood cell count.  CT ABDOMEN AND PELVIS WITHOUT CONTRAST  Technique:  Multidetector CT imaging of the abdomen and pelvis was performed following the standard protocol without intravenous contrast.  Comparison: Prior radiograph from 09/02/2012  Findings: Dependent atelectasis is present within the lung bases bilaterally.  Prominent extrapleural fat is seen on the left.  No pleural effusion appreciated.  There is cardiomegaly with prominent mitral valvular and aortic valvular calcifications.  No pericardial effusion.  The liver is unremarkable.  The gallbladder is surgically absent. There is no biliary ductal dilatation.  The spleen and adrenal glands are within normal limits.  There is fatty infiltration of the pancreas.  The kidneys are atrophic in appearance bilaterally.  No nephrolithiasis or hydronephrosis.  No significant perinephric fat stranding.  Evaluation for renal mass is limited on this noncontrast examination.  There is no evidence of bowel obstruction.  No abnormal wall thickening enhancement is seen about the bowels to suggest underlying inflammation. A Richter-type periumbilical hernia containing a portion of the transverse colon is present.  No associated obstruction.  Circumferential wall thickening is seen within the bladder with multiple small associated diverticula.  There is mild associated fat stranding.  The vagina itself is grossly normal.  No free air or fluid is identified.  Loculated fluid collection measuring 6.5 x 3.4 x 7.1 cm is present within the subcutaneous fat just overlying the left paravertebral musculature just to the left of midline, at the level of the L3-4. This collection lies approximately 2 cm deep to the skin.  There is associated soft tissue stranding within the subcutaneous fat.  This finding is worrisome for infection.  No acute osseous abnormalities are identified.  Degenerative disc desiccation is seen at  L4-5 and L5 S1.  Prominent atherosclerotic calcifications are seen throughout the intra-abdominal aorta and its branch vessels.  IMPRESSION:  1.  Loculated fluid collection within the subcutaneous fat of the lower back just to the left of midline at the level of L3-4. Finding is worrisome for infection. This collection would be amendable to image guided percutaneous drainage.  2.  Mild circumferential wall thickening within the bladder with multiple bladder diverticula.  Correlation with urinalysis to evaluate for possible cystitis is recommended.  3. Richter-type paraumbilical hernia containing a portion of the transverse colon without associated obstruction.  4.  Hiatal hernia.  5.  Cardiomegaly.  Critical Value/emergent results were called by telephone at the time of interpretation on 05/30/2013 at 06:45 a.m. to Dr. Pricilla Loveless by Dr. Hoy Morn, who verbally acknowledged these results.   Original Report Authenticated By: Rise Mu, M.D.    Review of Systems  Constitutional: Negative for fever, chills, weight loss, malaise/fatigue and diaphoresis.  HENT: Negative.   Eyes: Negative.   Respiratory: Positive for shortness of breath. Negative for cough, hemoptysis, sputum production and wheezing.   Cardiovascular: Positive for leg swelling.  On chronic O2 for CHF  Gastrointestinal: Negative.   Genitourinary:       Burning and stabbing pain in her vagina, just started yesterday. Chronic and recurrent UTI  Musculoskeletal:       Chronic peripheral neuropathy some in hands, but mostly in her feet and lower legs.  Skin: Negative.   Neurological: Positive for sensory change. Negative for dizziness, tingling, tremors, speech change, focal weakness, seizures, loss of consciousness and weakness.  Endo/Heme/Allergies: Negative for polydipsia. Bruises/bleeds easily.  Psychiatric/Behavioral: Positive for memory loss. Negative for depression, suicidal ideas, hallucinations and substance  abuse. The patient is not nervous/anxious and does not have insomnia.        She knows she has some memory issues, but seems pretty upbeat here in the ER.   Blood pressure 153/53, pulse 75, temperature 97.9 F (36.6 C), temperature source Oral, resp. rate 16, height 5' 8.5" (1.74 m), weight 92.5 kg (203 lb 14.8 oz), SpO2 100.00%. Physical Exam  Constitutional: She is oriented to person, place, and time. She appears well-developed and well-nourished. No distress.  Body mass index is 30.55 kg/(m^2). She is incontinent of urine and stool right now with depends garment in place.  HENT:  Head: Normocephalic and atraumatic.  Nose: Nose normal.  Mouth/Throat: No oropharyngeal exudate.  Eyes: Conjunctivae and EOM are normal. Pupils are equal, round, and reactive to light. Right eye exhibits no discharge. Left eye exhibits no discharge. No scleral icterus.  Neck: Normal range of motion. Neck supple. No JVD present. No tracheal deviation present. No thyromegaly present.  Cardiovascular: Normal rate, regular rhythm, normal heart sounds and intact distal pulses.  Exam reveals no gallop.   No murmur heard. Respiratory: Effort normal and breath sounds normal. No respiratory distress. She has no wheezes. She has no rales. She exhibits no tenderness.  GI: Soft. Bowel sounds are normal. She exhibits no distension and no mass. There is no tenderness. There is no rebound and no guarding.  Genitourinary:  Complains of pain just in vaginal area. Whole perinei um is red, she is incontinent of stool and urine.  Musculoskeletal: She exhibits edema (mild +1 edema both lower legs).  Back exam shows no areas of tenderness or erythema.  I cannot see or feel a fluid collection, she does not have any tenderness on her back.  I remove her depends and we changed her depends, I could not find the fluid collection on external exam.  Lymphadenopathy:    She has no cervical adenopathy.  Neurological: She is alert and oriented  to person, place, and time. No cranial nerve deficit.  Skin: Skin is warm and dry. No rash noted. She is not diaphoretic. No erythema. No pallor.  Psychiatric: She has a normal mood and affect. Her behavior is normal. Judgment and thought content normal.    Assessment/Plan: 1. Vaginal pain with probable recurrent UTI. (Elevated lactic acid) 2.  New finding of left paravertebral fluid at L3-4 level 6.5 x 3.4 x 7.1 cm 3. Hx of Cardiomyopathy/DCHF on chronic O2 4.  Hx of multiple TIA's/mild dementia 5.  Perpherial neuropathy 6.  Hypothyroid 7.  Hx of temporal arteritis on steroids 8.  Hx of colon cancer with prior partial colectomy 9.  Hypertension 10.  Multiple listed allergies  Plan:  Agree with antibiotics and IR drainage of fluid collection.  Medical management at this point.  We will follow with you.  Kazden Largo 05/30/2013, 8:51 AM

## 2013-05-30 NOTE — ED Notes (Signed)
Notified EDP, Criss Alvine, MD., pt. CG4 lactic acid i-stat results 3.50.

## 2013-05-30 NOTE — Progress Notes (Signed)
CARE MANAGEMENT NOTE 05/30/2013  Patient:  [name], [name]   Account Number:  [id number]  Date Initiated:  05/30/2013  Documentation initiated by:  Brinda Focht  Subjective/Objective Assessment:   pt with hx of multiple uti's sent from alf for sharp pains in the vaginal and abd area, suspicious fld collection in the peritoneal area.     Action/Plan:   to return to alf-[facility] once medically stable   Anticipated DC Date:  06/02/2013   Anticipated DC Plan:  ASSISTED LIVING / REST HOME  In-house referral  Clinical Social Worker      DC Planning Services  NA      [facility] Choice  NA   Choice offered to / List presented to:  NA   DME arranged  NA      DME agency  NA     HH arranged  NA      HH agency  NA   Status of service:  In process, will continue to follow Medicare Important Message given?  NA - LOS <3 / Initial given by admissions (If response is "NO", the following Medicare IM given date fields will be blank) Date Medicare IM given:   Date Additional Medicare IM given:    Discharge Disposition:    Per UR Regulation:  Reviewed for med. necessity/level of care/duration of stay  If discussed at Long Length of Stay Meetings, dates discussed:    Comments:  [id number] Stark Jock, BSN, [place] [phone] Chart Reviewed for discharge and hospital needs. Discharge needs at time of review:  None Review of patient progress due on [id number].

## 2013-05-30 NOTE — ED Notes (Signed)
Pt cannot urinate.  Will try in 30 minutes.

## 2013-05-30 NOTE — Progress Notes (Signed)
ANTIBIOTIC CONSULT NOTE - INITIAL  Pharmacy Consult for Vancomycin and Zosyn  Indication: UTI and Left paralumbar loculated fluid collection/? abscess  Allergies  Allergen Reactions  . Apap [Acetaminophen]     Reaction unknown  . Ciprofloxacin Nausea And Vomiting  . Codeine Other (See Comments)    halucinations  . Demerol Other (See Comments)    halucinations  . Morphine And Related Other (See Comments)    halucinations  . Nitrofurantoin Monohyd Macro Other (See Comments)    Per MAR  . Pregabalin Other (See Comments)    unknown  . Septra [Bactrim] Other (See Comments)    Unknown cant tolerate DS    Patient Measurements: Height: 5' 8.5" (174 cm) Weight: 203 lb 14.8 oz (92.5 kg) IBW/kg (Calculated) : 65.06 Adjusted Body Weight:   Vital Signs: Temp: 98.5 F (36.9 C) (09/04 2000) Temp src: Oral (09/04 2000) BP: 157/51 mmHg (09/04 1740) Pulse Rate: 82 (09/04 1740) Intake/Output from previous day:   Intake/Output from this shift:    Labs:  Recent Labs  05/30/13 0328  WBC 11.8*  HGB 10.6*  PLT 251  CREATININE 1.35*   Estimated Creatinine Clearance: 39.3 ml/min (by C-G formula based on Cr of 1.35). No results found for this basename: VANCOTROUGH, Leodis Binet, VANCORANDOM, GENTTROUGH, GENTPEAK, GENTRANDOM, TOBRATROUGH, TOBRAPEAK, TOBRARND, AMIKACINPEAK, AMIKACINTROU, AMIKACIN,  in the last 72 hours   Microbiology: Recent Results (from the past 720 hour(s))  MRSA PCR SCREENING     Status: None   Collection Time    05/30/13  9:54 AM      Result Value Range Status   MRSA by PCR NEGATIVE  NEGATIVE Final   Comment:            The GeneXpert MRSA Assay (FDA     approved for NASAL specimens     only), is one component of a     comprehensive MRSA colonization     surveillance program. It is not     intended to diagnose MRSA     infection nor to guide or     monitor treatment for     MRSA infections.    Medical History: Past Medical History  Diagnosis Date  .  Peripheral neuropathy   . Hypertension   . TIA (transient ischemic attack)   . Elevated sed rate   . Temporal arteritis     chronic steroids  . Hypothyroidism   . Colon cancer     colon  . History of hernia repair   . S/P rotator cuff surgery     right  . Diastolic CHF   . Cardiomegaly - hypertensive   . Chronic anemia   . Chronic steroid use   . Dementia   . Shortness of breath   . Hyperlipidemia   . CHF (congestive heart failure)   . Cardiomyopathy due to hypertension   . Dependence on supplemental oxygen   . Chronic UTI   . Dementia   . Giant cell arteritis     Medications:  Anti-infectives   Start     Dose/Rate Route Frequency Ordered Stop   05/31/13 0800  vancomycin (VANCOCIN) 1,250 mg in sodium chloride 0.9 % 250 mL IVPB     1,250 mg 166.7 mL/hr over 90 Minutes Intravenous Every 24 hours 05/30/13 2220     05/30/13 2230  piperacillin-tazobactam (ZOSYN) IVPB 3.375 g     3.375 g 12.5 mL/hr over 240 Minutes Intravenous 3 times per day 05/30/13 2220     05/30/13 0715  vancomycin (VANCOCIN) 1,250 mg in sodium chloride 0.9 % 250 mL IVPB     1,250 mg 166.7 mL/hr over 90 Minutes Intravenous  Once 05/30/13 0700 05/30/13 1010   05/30/13 0715  piperacillin-tazobactam (ZOSYN) IVPB 3.375 g     3.375 g 100 mL/hr over 30 Minutes Intravenous  Once 05/30/13 0700 05/30/13 0840     Assessment: Patient with UTI and Left paralumbar loculated fluid collection/? Abscess.    Goal of Therapy:  Vancomycin trough level 15-20 mcg/ml Zosyn based on renal function  Plan:  Measure antibiotic drug levels at steady state Follow up culture results Vancomycin 1250mg  iv q24hr Zosyn 3.375g IV Q8H infused over 4hrs.   Aleene Davidson Crowford 05/30/2013,10:23 PM

## 2013-05-30 NOTE — ED Notes (Signed)
Pt was showering last evening when she started to experience sharp vaginal pain that felt like she was being stabbed. Pain is intermittent. Later pt began to have flatus and states that she was having abdominal discomfort. She is unsure of LBM.

## 2013-05-30 NOTE — H&P (Addendum)
[facility] History and Physical  [name] ZOX:[id number] DOB: 08/31/1930 DOA: 05/30/2013  Referring physician: Dr Criss Alvine PCP: Cain Saupe, MD  Specialists:   Chief Complaint: lower abd.  HPI: [name] is a 79 y.o. female with history of multiple medical problems including recurrent UTIs, chronic steroid use, temporal arteritis, hypertension who presents with complaints of lower abdominal pain. She reports that the pain feels more like a stabbing vaginal pain-intermittent and severe. She denies any vaginal discharge. She denies fevers, chills. She was seen in the ED and a CT scan of her abdomen and pelvis was done and showed a loculated fluid collection within the subcutaneous fat of the lower back just to the left of the midline at the level of L3-4-finding worrisome for infection per radiology. Urinalysis was done and was consistent with infection, lactic acid level was 3.5. She was started on empiric antibiotics, surgery was consulted and patient admitted for further evaluation and management. Patient denies any back pain and no swelling.   Review of Systems: The patient denies anorexia, fever, weight loss,, vision loss, decreased hearing, hoarseness, chest pain, syncope, dyspnea on exertion, peripheral edema, balance deficits, hemoptysis, abdominal pain, melena, hematochezia, severe indigestion/heartburn, hematuria, incontinence, genital sores, muscle weakness, transient blindness, difficulty walking, depression, unusual weight change, abnormal bleeding   Past Medical History  Diagnosis Date  . Peripheral neuropathy   . Hypertension   . TIA (transient ischemic attack)   . Elevated sed rate   . Temporal arteritis     chronic steroids  . Hypothyroidism   . Colon cancer     colon  . History of hernia repair   . S/P rotator cuff surgery     right  . Diastolic CHF   . Cardiomegaly - hypertensive   . Chronic anemia   . Chronic steroid use   . Dementia   .  Shortness of breath   . Hyperlipidemia   . CHF (congestive heart failure)   . Cardiomyopathy due to hypertension   . Dependence on supplemental oxygen   . Chronic UTI   . Dementia   . Giant cell arteritis    Past Surgical History  Procedure Laterality Date  . Tonsillectomy  1939  . Colectomy  1987  . Abdominal hysterectomy  1974  . Appendectomy  1947  . Hernia repair  1990  . Cholecystectomy  2000  . Rotator cuff repair  2004  . Right temporal artery biopsy  10/1998, 10/2009  . Right cataract extraction  2011  . Eye surgery    . Lumbar puncture  10/07/2004   Social History:  reports that she has never smoked. She has never used smokeless tobacco. She reports that she does not drink alcohol or use illicit drugs.  where does patient live--home   Allergies  Allergen Reactions  . Apap [Acetaminophen]     Reaction unknown  . Ciprofloxacin Nausea And Vomiting  . Codeine Other (See Comments)    halucinations  . Demerol Other (See Comments)    halucinations  . Morphine And Related Other (See Comments)    halucinations  . Nitrofurantoin Monohyd Macro Other (See Comments)    Per MAR  . Pregabalin Other (See Comments)    unknown  . Septra [Bactrim] Other (See Comments)    Unknown cant tolerate DS    Family History  Problem Relation Age of Onset  . Dementia Mother   . Heart attack Father     Prior to Admission medications   Medication  Sig Start Date End Date Taking? Authorizing Provider  acetaminophen (TYLENOL) 500 MG tablet Take 500 mg by mouth every 6 (six) hours as needed for pain.   Yes Historical Provider, MD  amLODipine (NORVASC) 5 MG tablet Take 5 mg by mouth every morning.   Yes Historical Provider, MD  Cranberry 250 MG CAPS Take 500 mg by mouth 2 (two) times daily.   Yes Historical Provider, MD  dipyridamole-aspirin (AGGRENOX) 25-200 MG per 12 hr capsule Take 1 capsule by mouth 2 (two) times daily.     Yes Historical Provider, MD  donepezil (ARICEPT) 10 MG tablet  Take 10 mg by mouth at bedtime.   Yes Historical Provider, MD  ferrous sulfate 325 (65 FE) MG tablet Take 325 mg by mouth daily with breakfast.   Yes Historical Provider, MD  Fiber CAPS Take by mouth daily.   Yes Historical Provider, MD  furosemide (LASIX) 40 MG tablet Take 40 mg by mouth every morning.   Yes Historical Provider, MD  guaiFENesin (ROBITUSSIN) 100 MG/5ML SOLN Take 10 mLs by mouth every 6 (six) hours as needed.   Yes Historical Provider, MD  hydrALAZINE (APRESOLINE) 10 MG tablet Take 1 tablet (10 mg total) by mouth every 8 (eight) hours. 12/09/12  Yes Nishant Dhungel, MD  levothyroxine (SYNTHROID, LEVOTHROID) 150 MCG tablet Take 150 mcg by mouth daily before breakfast.   Yes Historical Provider, MD  loperamide (IMODIUM) 2 MG capsule Take 2 mg by mouth 4 (four) times daily as needed for diarrhea or loose stools.   Yes Historical Provider, MD  magnesium hydroxide (MILK OF MAGNESIA) 400 MG/5ML suspension Take 30 mLs by mouth daily as needed for constipation.   Yes Historical Provider, MD  Memantine HCl ER (NAMENDA XR) 28 MG CP24 Take 1 capsule by mouth every morning.   Yes Historical Provider, MD  Multiple Vitamin (THERA/BETA-CAROTENE) TABS Take 1 tablet by mouth every morning.    Yes Historical Provider, MD  omeprazole (PRILOSEC) 20 MG capsule Take 20 mg by mouth every morning.    Yes Historical Provider, MD  potassium chloride SA (K-DUR,KLOR-CON) 20 MEQ tablet Take 20 mEq by mouth 2 (two) times daily.   Yes Historical Provider, MD  predniSONE (DELTASONE) 5 MG tablet Take 10-15 mg by mouth See admin instructions. Alternate between 10mg  and 15mg  every other day   Yes Historical Provider, MD  saccharomyces boulardii (FLORASTOR) 250 MG capsule Take 250 mg by mouth 2 (two) times daily.   Yes Historical Provider, MD  sertraline (ZOLOFT) 50 MG tablet Take 50 mg by mouth every morning.    Yes Historical Provider, MD  sulfamethoxazole-trimethoprim (BACTRIM,SEPTRA) 400-80 MG per tablet Take 1  tablet by mouth at bedtime.   Yes Historical Provider, MD  ondansetron (ZOFRAN-ODT) 8 MG disintegrating tablet Take 1 tablet (8 mg total) by mouth every 8 (eight) hours as needed for nausea. 04/13/13   Olivia Mackie, MD   Physical Exam: Filed Vitals:   05/30/13 1547  BP: 154/47  Pulse: 75  Temp: 97.8 F (36.6 C)  Resp: 21    Constitutional: Vital signs reviewed.  Patient is a well-developed, obese,  in no acute distress and cooperative with exam. Alert and oriented x3.  Head: Normocephalic and atraumatic Nose: No erythema or drainage noted.   Mouth: no erythema or exudates, MMM Eyes: PERRL, EOMI, conjunctivae normal, No scleral icterus.  Neck: Supple, Trachea midline normal ROM, No JVD, mass, thyromegaly, or carotid bruit present.  Cardiovascular: RRR, S1 normal, S2 normal, no MRG,  pulses symmetric and intact bilaterally Pulmonary/Chest: normal respiratory effort, CTAB, no wheezes, rales, or rhonchi Abdominal: Soft. Lower abdominal tenderness, no rebound tenderness non-distended, bowel sounds are normal, no masses, organomegaly, or guarding present.  GU: She has diapers on(continent of urine and stool) no CVA tenderness Back : No areas of erythema, tenderness, induration or swelling.  extremities: +1 edema, no cyanosis  Neurological: A&O x3, Strength is normal and symmetric bilaterally, cranial nerve II-XII are grossly intact, no focal motor deficit, sensory intact to light touch bilaterally.  Skin: Warm, dry and intact. No rash, cyanosis, or clubbing.  Psychiatric: Normal mood and affect.    Labs on Admission:  Basic Metabolic Panel:  Recent Labs Lab 05/30/13 0328  NA 137  K 3.7  CL 99  CO2 25  GLUCOSE 91  BUN 16  CREATININE 1.35*  CALCIUM 8.7   Liver Function Tests:  Recent Labs Lab 05/30/13 0328  AST 16  ALT 13  ALKPHOS 72  BILITOT 0.2*  PROT 6.5  ALBUMIN 2.8*   No results found for this basename: LIPASE, AMYLASE,  in the last 168 hours No results found for  this basename: AMMONIA,  in the last 168 hours CBC:  Recent Labs Lab 05/30/13 0328  WBC 11.8*  NEUTROABS 7.6  HGB 10.6*  HCT 33.6*  MCV 84.6  PLT 251   Cardiac Enzymes: No results found for this basename: CKTOTAL, CKMB, CKMBINDEX, TROPONINI,  in the last 168 hours  BNP (last 3 results)  Recent Labs  10/08/12 1723 10/12/12 0530 01/23/13 1640  PROBNP 1356.0* 8213.0* 1346.0*   CBG: No results found for this basename: GLUCAP,  in the last 168 hours  Radiological Exams on Admission: Ct Abdomen Pelvis Wo Contrast  05/30/2013   *RADIOLOGY REPORT*  Clinical Data: Lower abdominal pain, vaginal pain since 05/29/2013, elevated white blood cell count.  CT ABDOMEN AND PELVIS WITHOUT CONTRAST  Technique:  Multidetector CT imaging of the abdomen and pelvis was performed following the standard protocol without intravenous contrast.  Comparison: Prior radiograph from 09/02/2012  Findings: Dependent atelectasis is present within the lung bases bilaterally.  Prominent extrapleural fat is seen on the left.  No pleural effusion appreciated.  There is cardiomegaly with prominent mitral valvular and aortic valvular calcifications.  No pericardial effusion.  The liver is unremarkable.  The gallbladder is surgically absent. There is no biliary ductal dilatation.  The spleen and adrenal glands are within normal limits.  There is fatty infiltration of the pancreas.  The kidneys are atrophic in appearance bilaterally.  No nephrolithiasis or hydronephrosis.  No significant perinephric fat stranding.  Evaluation for renal mass is limited on this noncontrast examination.  There is no evidence of bowel obstruction.  No abnormal wall thickening enhancement is seen about the bowels to suggest underlying inflammation. A Richter-type periumbilical hernia containing a portion of the transverse colon is present.  No associated obstruction.  Circumferential wall thickening is seen within the bladder with multiple small  associated diverticula.  There is mild associated fat stranding.  The vagina itself is grossly normal.  No free air or fluid is identified.  Loculated fluid collection measuring 6.5 x 3.4 x 7.1 cm is present within the subcutaneous fat just overlying the left paravertebral musculature just to the left of midline, at the level of the L3-4. This collection lies approximately 2 cm deep to the skin.  There is associated soft tissue stranding within the subcutaneous fat.  This finding is worrisome for infection.  No acute osseous  abnormalities are identified.  Degenerative disc desiccation is seen at L4-5 and L5 S1.  Prominent atherosclerotic calcifications are seen throughout the intra-abdominal aorta and its branch vessels.  IMPRESSION:  1.  Loculated fluid collection within the subcutaneous fat of the lower back just to the left of midline at the level of L3-4. Finding is worrisome for infection. This collection would be amendable to image guided percutaneous drainage.  2.  Mild circumferential wall thickening within the bladder with multiple bladder diverticula.  Correlation with urinalysis to evaluate for possible cystitis is recommended.  3. Richter-type paraumbilical hernia containing a portion of the transverse colon without associated obstruction.  4.  Hiatal hernia.  5.  Cardiomegaly.  Critical Value/emergent results were called by telephone at the time of interpretation on 05/30/2013 at 06:45 a.m. to Dr. Pricilla Loveless by Dr. Hoy Morn, who verbally acknowledged these results.   Original Report Authenticated By: Rise Mu, M.D.   US Aspiration  05/30/2013   *RADIOLOGY REPORT*  Clinical history:Fluid collection in the lower back subcutaneous tissues.  PROCEDURE(S): ULTRASOUND GUIDED ASPIRATION OF SUBCUTANEOUS FLUID COLLECTION  Physician: Rachelle Hora. Henn, MD  Medications:None  Moderate sedation time:None  Fluoroscopy time: None  Procedure:The procedure was explained to the patient.  The risks and  benefits of the procedure were discussed and the patient's questions were addressed.  Informed consent was obtained from the patient.  The patient was placed in a right lateral decubitus position.  Ultrasound demonstrated a simple appearing fluid collection in the lower back subcutaneous tissue.  The skin was prepped and draped in a sterile fashion.  Skin was anesthetized with lidocaine.  5-French Yueh catheter was directed into the fluid collection with ultrasound guidance.  30 ml of cloudy yellow fluid was removed.  The entire collection was aspirated.  The catheter was removed without complication.  Findings:Simple appearing fluid collection within the subcutaneous tissues.  30 ml of fluid was removed.  Fluid was sent for Gram stain and culture.  Complications: None  Impression:Ultrasound guided aspiration of the lower back subcutaneous fluid collection.   Original Report Authenticated By: Richarda Overlie, M.D.      Assessment/Plan Active Problems:   UTI (lower urinary tract infection) -We'll obtain urine cultures, empiric antibiotics with Zosyn and follow -She has an elevated lactic acid level but has no tachycardia leukocytosis or hypertension-we'll follow and recheck lactic acid level -Admit to step down unit for close monitoring overnight Left paralumbar loculated fluid collection/? abscess -Surgery consulted>> recommend IR for drainage and I have consulted IR -Empiric antibiotics as above, add vanc to cover  for possible community acquried MRSA and follow   Hypertension -Continue outpatient medications   AKI (acute kidney injury) -Hold today's dose of Lasix, follow recheck    Chronic diastolic heart failure -Hold off IV fluids, Monitor fluid status, follow Resume Lasix in a.m. the  Lactic acidosis -As discussed above in patient with probable UTI and paralumbar loculated fluid collection/question abscess -Empiric antibiotics as above follow and recheck Chronic steroid use -She is  hemodynamically stable, will continue outpatient prednisone and follow     Code Status: DNR Family Communication:Daughter at bedside Disposition Plan:  Time spent: >71mins  Kela Millin [facility] Pager [phone]  If 7PM-7AM, please contact night-coverage [email or web address] Password [facility] 05/30/2013, 4:58 PM

## 2013-05-30 NOTE — ED Notes (Signed)
Attempted lab draw x 2, but unsuccessful. Called phlebotomy to draw labs and will come as soon as possible.

## 2013-05-30 NOTE — ED Provider Notes (Signed)
CSN: [id number]     Arrival date & time 05/30/13  0152 History   First MD Initiated Contact with Patient 05/30/13 0225     Chief Complaint  Patient presents with  . Vaginal Pain  . Abdominal Pain   (Consider location/radiation/quality/duration/timing/severity/associated sxs/prior Treatment) HPI Comments: 79 year old female presents with less than 1 day of lower abdominal pain and vaginal pain. She states she is unsure if she is having diarrhea or when her last bowel movement was. States she has dementia and this is normal for her. Denies any dysuria, vaginal bleeding, or vaginal discharge. Denies any known fevers. The pain seems to come and go. The pain is severe. She states she has not had pain like this before. She points to her left lower quadrant when asked where the pain is worst.  The history is provided by the patient.    Past Medical History  Diagnosis Date  . Peripheral neuropathy   . Hypertension   . TIA (transient ischemic attack)   . Elevated sed rate   . Temporal arteritis     chronic steroids  . Hypothyroidism   . Colon cancer     colon  . History of hernia repair   . S/P rotator cuff surgery     right  . Diastolic CHF   . Cardiomegaly - hypertensive   . Chronic anemia   . Chronic steroid use   . Dementia   . Shortness of breath   . Hyperlipidemia   . CHF (congestive heart failure)   . Cardiomyopathy due to hypertension   . Dependence on supplemental oxygen   . Chronic UTI   . Dementia   . Giant cell arteritis    Past Surgical History  Procedure Laterality Date  . Tonsillectomy  1939  . Colectomy  1987  . Abdominal hysterectomy  1974  . Appendectomy  1947  . Hernia repair  1990  . Cholecystectomy  2000  . Rotator cuff repair  2004  . Right temporal artery biopsy  10/1998, 10/2009  . Right cataract extraction  2011  . Eye surgery    . Lumbar puncture  10/07/2004   Family History  Problem Relation Age of Onset  . Dementia Mother   . Heart attack  Father    History  Substance Use Topics  . Smoking status: Never Smoker   . Smokeless tobacco: Never Used  . Alcohol Use: No   OB History   Grav Para Term Preterm Abortions TAB SAB Ect Mult Living                 Review of Systems  Unable to perform ROS: Dementia    Allergies  Apap; Ciprofloxacin; Codeine; Demerol; Morphine and related; Nitrofurantoin monohyd macro; Pregabalin; and Septra  Home Medications   Current Outpatient Rx  Name  Route  Sig  Dispense  Refill  . amLODipine (NORVASC) 5 MG tablet   Oral   Take 5 mg by mouth every morning.         . cefixime (SUPRAX) 400 MG tablet   Oral   Take 1 tablet (400 mg total) by mouth once.   10 tablet   0   . Cranberry 250 MG CAPS   Oral   Take 500 mg by mouth 2 (two) times daily.         Marland Kitchen dipyridamole-aspirin (AGGRENOX) 25-200 MG per 12 hr capsule   Oral   Take 1 capsule by mouth 2 (two) times daily.           Marland Kitchen  donepezil (ARICEPT) 10 MG tablet   Oral   Take 10 mg by mouth at bedtime.         . ferrous sulfate 325 (65 FE) MG tablet   Oral   Take 325 mg by mouth daily with breakfast.         . Fiber CAPS   Oral   Take by mouth daily.         . furosemide (LASIX) 40 MG tablet   Oral   Take 40 mg by mouth every morning.         . hydrALAZINE (APRESOLINE) 10 MG tablet   Oral   Take 1 tablet (10 mg total) by mouth every 8 (eight) hours.   90 tablet   0   . levothyroxine (SYNTHROID, LEVOTHROID) 150 MCG tablet   Oral   Take 150 mcg by mouth daily before breakfast.         . Memantine HCl ER (NAMENDA XR) 28 MG CP24   Oral   Take 1 capsule by mouth every morning.         . Multiple Vitamin (THERA/BETA-CAROTENE) TABS   Oral   Take 1 tablet by mouth every morning.          Marland Kitchen omeprazole (PRILOSEC) 20 MG capsule   Oral   Take 20 mg by mouth every morning.          . ondansetron (ZOFRAN-ODT) 8 MG disintegrating tablet   Oral   Take 1 tablet (8 mg total) by mouth every 8 (eight)  hours as needed for nausea.   20 tablet   0   . phenazopyridine (PYRIDIUM) 200 MG tablet   Oral   Take 1 tablet (200 mg total) by mouth 3 (three) times daily with meals.   10 tablet   0   . polycarbophil (FIBERCON) 625 MG tablet   Oral   Take 625 mg by mouth every morning.          . potassium chloride SA (K-DUR,KLOR-CON) 20 MEQ tablet   Oral   Take 20 mEq by mouth 2 (two) times daily.         . predniSONE (DELTASONE) 5 MG tablet   Oral   Take 10-15 mg by mouth See admin instructions. Alternate between 10mg  and 15mg  every other day         . saccharomyces boulardii (FLORASTOR) 250 MG capsule   Oral   Take 250 mg by mouth 2 (two) times daily.         . sertraline (ZOLOFT) 50 MG tablet   Oral   Take 50 mg by mouth every morning.          . simvastatin (ZOCOR) 20 MG tablet   Oral   Take 20 mg by mouth daily.         Marland Kitchen sulfamethoxazole-trimethoprim (BACTRIM,SEPTRA) 400-80 MG per tablet   Oral   Take 1 tablet by mouth at bedtime.          BP 140/60  Pulse 75  Temp(Src) 98.3 F (36.8 C) (Oral)  Resp 22  SpO2 95% Physical Exam  Vitals reviewed. Constitutional: She is oriented to person, place, and time. She appears well-developed and well-nourished.  HENT:  Head: Normocephalic and atraumatic.  Right Ear: External ear normal.  Left Ear: External ear normal.  Nose: Nose normal.  Eyes: Right eye exhibits no discharge. Left eye exhibits no discharge.  Cardiovascular: Normal rate, regular rhythm and normal heart sounds.   Pulmonary/Chest:  Effort normal and breath sounds normal.  Abdominal: Soft. There is tenderness in the right lower quadrant, suprapubic area and left lower quadrant.  Genitourinary:  Erythematous/inflamed labia bilaterally  Neurological: She is alert and oriented to person, place, and time.  Skin: Skin is warm and dry.    ED Course  Procedures (including critical care time) Labs Review Labs Reviewed  CBC WITH DIFFERENTIAL -  Abnormal; Notable for the following:    WBC 11.8 (*)    Hemoglobin 10.6 (*)    HCT 33.6 (*)    RDW 18.1 (*)    All other components within normal limits  COMPREHENSIVE METABOLIC PANEL - Abnormal; Notable for the following:    Creatinine, Ser 1.35 (*)    Albumin 2.8 (*)    Total Bilirubin 0.2 (*)    GFR calc non Af Amer 36 (*)    GFR calc Af Amer 41 (*)    All other components within normal limits  URINALYSIS, ROUTINE W REFLEX MICROSCOPIC - Abnormal; Notable for the following:    Leukocytes, UA MODERATE (*)    All other components within normal limits  CG4 I-STAT (LACTIC ACID) - Abnormal; Notable for the following:    Lactic Acid, Venous 3.50 (*)    All other components within normal limits  CULTURE, BLOOD (ROUTINE X 2)  CULTURE, BLOOD (ROUTINE X 2)  URINE MICROSCOPIC-ADD ON   Imaging Review Ct Abdomen Pelvis Wo Contrast  05/30/2013   *RADIOLOGY REPORT*  Clinical Data: Lower abdominal pain, vaginal pain since 05/29/2013, elevated white blood cell count.  CT ABDOMEN AND PELVIS WITHOUT CONTRAST  Technique:  Multidetector CT imaging of the abdomen and pelvis was performed following the standard protocol without intravenous contrast.  Comparison: Prior radiograph from 09/02/2012  Findings: Dependent atelectasis is present within the lung bases bilaterally.  Prominent extrapleural fat is seen on the left.  No pleural effusion appreciated.  There is cardiomegaly with prominent mitral valvular and aortic valvular calcifications.  No pericardial effusion.  The liver is unremarkable.  The gallbladder is surgically absent. There is no biliary ductal dilatation.  The spleen and adrenal glands are within normal limits.  There is fatty infiltration of the pancreas.  The kidneys are atrophic in appearance bilaterally.  No nephrolithiasis or hydronephrosis.  No significant perinephric fat stranding.  Evaluation for renal mass is limited on this noncontrast examination.  There is no evidence of bowel  obstruction.  No abnormal wall thickening enhancement is seen about the bowels to suggest underlying inflammation. A Richter-type periumbilical hernia containing a portion of the transverse colon is present.  No associated obstruction.  Circumferential wall thickening is seen within the bladder with multiple small associated diverticula.  There is mild associated fat stranding.  The vagina itself is grossly normal.  No free air or fluid is identified.  Loculated fluid collection measuring 6.5 x 3.4 x 7.1 cm is present within the subcutaneous fat just overlying the left paravertebral musculature just to the left of midline, at the level of the L3-4. This collection lies approximately 2 cm deep to the skin.  There is associated soft tissue stranding within the subcutaneous fat.  This finding is worrisome for infection.  No acute osseous abnormalities are identified.  Degenerative disc desiccation is seen at L4-5 and L5 S1.  Prominent atherosclerotic calcifications are seen throughout the intra-abdominal aorta and its branch vessels.  IMPRESSION:  1.  Loculated fluid collection within the subcutaneous fat of the lower back just to the left of midline  at the level of L3-4. Finding is worrisome for infection. This collection would be amendable to image guided percutaneous drainage.  2.  Mild circumferential wall thickening within the bladder with multiple bladder diverticula.  Correlation with urinalysis to evaluate for possible cystitis is recommended.  3. Richter-type paraumbilical hernia containing a portion of the transverse colon without associated obstruction.  4.  Hiatal hernia.  5.  Cardiomegaly.  Critical Value/emergent results were called by telephone at the time of interpretation on 05/30/2013 at 06:45 a.m. to Dr. Pricilla Loveless by Dr. Hoy Morn, who verbally acknowledged these results.   Original Report Authenticated By: Rise Mu, M.D.    MDM   1. Subcutaneous abscess   2. Sepsis   3.  UTI (urinary tract infection)    Hx limited by dementia, but CT obtained due to lower abd tenderness. Subq abscess seen on back on CT. On re-exam, no focal tenderness or signs of cellulitis noted. Patient given fluids and broad spectrum abx after blood cultures. D/w medicine, will admit for abx and IR drainage. Also d/w surgery, who agrees this is better suited for a drain rather than open procedure. I discussed with the daughter Melina Modena), who reiterates patient is DNR but wants the IR drain pursued.     Audree Camel, MD 05/30/13 [id number]

## 2013-05-31 LAB — CBC
HCT: 34 % — ABNORMAL LOW (ref 36.0–46.0)
Hemoglobin: 10.5 g/dL — ABNORMAL LOW (ref 12.0–15.0)
MCV: 86.3 fL (ref 78.0–100.0)
RBC: 3.94 MIL/uL (ref 3.87–5.11)
RDW: 18.3 % — ABNORMAL HIGH (ref 11.5–15.5)
WBC: 6.8 10*3/uL (ref 4.0–10.5)

## 2013-05-31 LAB — BASIC METABOLIC PANEL
CO2: 29 mEq/L (ref 19–32)
Chloride: 105 mEq/L (ref 96–112)
Creatinine, Ser: 1.15 mg/dL — ABNORMAL HIGH (ref 0.50–1.10)
GFR calc Af Amer: 50 mL/min — ABNORMAL LOW (ref >90)
Potassium: 4.2 mEq/L (ref 3.5–5.1)

## 2013-05-31 LAB — URINE CULTURE

## 2013-05-31 MED ORDER — DIPHENHYDRAMINE HCL 50 MG/ML IJ SOLN
25.0000 mg | Freq: Two times a day (BID) | INTRAMUSCULAR | Status: DC
  Administered 2013-06-01: 04:00:00 25 mg via INTRAVENOUS
  Filled 2013-05-31 (×2): qty 0.5
  Filled 2013-05-31: qty 1

## 2013-05-31 MED ORDER — DIPHENHYDRAMINE HCL 25 MG PO CAPS
ORAL_CAPSULE | ORAL | Status: AC
  Filled 2013-05-31: qty 1

## 2013-05-31 MED ORDER — CLOTRIMAZOLE 1 % EX CREA
TOPICAL_CREAM | Freq: Two times a day (BID) | CUTANEOUS | Status: DC
  Administered 2013-05-31: 1 via TOPICAL
  Administered 2013-05-31 – 2013-06-01 (×2): via TOPICAL
  Filled 2013-05-31: qty 15

## 2013-05-31 MED ORDER — VANCOMYCIN HCL IN DEXTROSE 750-5 MG/150ML-% IV SOLN
750.0000 mg | Freq: Two times a day (BID) | INTRAVENOUS | Status: DC
  Administered 2013-06-01: 04:00:00 750 mg via INTRAVENOUS
  Filled 2013-05-31 (×2): qty 150

## 2013-05-31 MED ORDER — DIPHENHYDRAMINE HCL 50 MG/ML IJ SOLN: 25.0000 mg | INTRAMUSCULAR | Status: DC

## 2013-05-31 MED ORDER — DIPHENHYDRAMINE HCL 25 MG PO CAPS
25.0000 mg | ORAL_CAPSULE | Freq: Four times a day (QID) | ORAL | Status: DC | PRN
  Administered 2013-05-31: 25 mg via ORAL

## 2013-05-31 NOTE — Progress Notes (Signed)
Pt being transferred to tele, report called to 4 east; pt stable at time of transfer

## 2013-05-31 NOTE — Progress Notes (Addendum)
ANTIBIOTIC CONSULT NOTE - FOLLOW UP  Pharmacy Consult for Vancomycin, Zosyn Indication: r/o UTI, paravertebral fluid/abscess  Allergies  Allergen Reactions  . Apap [Acetaminophen]     Reaction unknown  . Ciprofloxacin Nausea And Vomiting  . Codeine Other (See Comments)    halucinations  . Demerol Other (See Comments)    halucinations  . Morphine And Related Other (See Comments)    halucinations  . Nitrofurantoin Monohyd Macro Other (See Comments)    Per MAR  . Pregabalin Other (See Comments)    unknown  . Septra [Bactrim] Other (See Comments)    Unknown cant tolerate DS    Patient Measurements: Height: 5' 8.5" (174 cm) Weight: 203 lb 14.8 oz (92.5 kg) IBW/kg (Calculated) : 65.06  Vital Signs: Temp: 97.7 F (36.5 C) (09/05 0800) Temp src: Axillary (09/05 0800) BP: 154/49 mmHg (09/05 0800) Pulse Rate: 77 (09/05 0800) Intake/Output from previous day: 09/04 0701 - 09/05 0700 In: 540 [P.O.:440; IV Piggyback:100] Out: 1 [Stool:1] Intake/Output from this shift:    Labs:  Recent Labs  05/30/13 0328 05/31/13 0335  WBC 11.8* 6.8  HGB 10.6* 10.5*  PLT 251 244  CREATININE 1.35* 1.15*    Assessment: 83 yof presented 9/4 with c/o vaginal and abd pain. CT abd/pelvis with "Loculated fluid collection within the subcutaneous fat of the lower back just to the left of midline at the level of L3-4. Finding is worrisome for infection. This collection would be amendable to image guided percutaneous drainage". Abx for paravertebral fluid (concerning for abscess), probable recurrent UTI.   9/4 >> Zosyn >> 9/4 >> Vanc >>  Tmax: afeb  WBCs: wnl starting 9/5 Renal: Scr 1.15, CG 46, N 44  9/4 urine >> NGF 9/4 blood x 2 >> NGTD 9/4 abscess >> NGTD 9/4 mrsa pcr >> negative   Courtney (RN) reported possible Vanc rxn 9/4 and 9/5 - itchy palms, red spots on chest - no serious reaction. Pt's IV infiltrated and 0800 dose today not completed until ~noon.  Goal of Therapy:   Vancomycin trough level 15-20 mcg/ml  Plan:   Change Vancomycin to 750 mg IV q12h for improved renal function, first dose at 0400 tomorrow  Bendaryl 25mg  IV - 30 minutes prior to Vancomycin doses   Continue Zosyn as ordered  F/u for reaction  Geoffry Paradise, PharmD, BCPS Pager: [phone] 12:24 PM Pharmacy #: [id number]

## 2013-05-31 NOTE — Progress Notes (Signed)
[facility] PROGRESS NOTE  [name] ZOX:[id number] DOB: [birth year] DOA: 05/30/2013 PCP: Cain Saupe, MD  Assessment/Plan:  Active Problems:  UTI (lower urinary tract infection)  -coontinue empiric antibiotics with Zosyn and follow culture  -lactic acidosis resolved and she remains hemodynamically stable  -transfer to tele Left paralumbar loculated fluid collection/? abscess  -Surgery consulted>> recommend IR for drainage   -Empiric antibiotics as above, add vanc to cover for possible community acquried MRSA and follow  -s/p aspiration per IR and Gram Stain with FEW WBC PRESENT,BOTH PMN AND MONONUCLEAR, NO SQUAMOUS EPITHELIAL CELLS SEEN, NO ORGANISMS SEEN. NGTD- will follow -appreciate surgery and IR assistance Hypertension  -Continue outpatient medications  AKI (acute kidney injury)  -resolved, will resume Lasix, follow Chronic diastolic heart failure  -Holding off IV fluids, Monitor fluid status, Resume Lasix as above Lactic acidosis  -As discussed above in patient with probable UTI and paralumbar loculated fluid collection/question abscess  -resolved on Empiric antibiotics  Chronic steroid use  -She is hemodynamically stable, will continue outpatient prednisone and follow      Code Status: full Family Communication: none at bedside Disposition Plan: transfer to Tele   Consultants:  CCS and IR  Procedures:  S/P aspiration of paralumbar fluid collection per IR  Antibiotics:  Vanc and zosyn started on 9/4  HPI/Subjective:  Objective: Filed Vitals:   05/31/13 0600  BP: 165/46  Pulse: 71  Temp:   Resp: 15    Intake/Output Summary (Last 24 hours) at 05/31/13 0846 Last data filed at 05/31/13 0700  Gross per 24 hour  Intake    540 ml  Output      0 ml  Net    540 ml   Filed Weights   05/30/13 0702 05/31/13 0400  Weight: 92.5 kg (203 lb 14.8 oz) 92.5 kg (203 lb 14.8 oz)    Exam:  General: alert & oriented x 3 In NAD Cardiovascular: RRR,  nl S1 s2 Respiratory: CTAB Abdomen: soft +BS NT/ND, no masses palpable Extremities: No cyanosis and trace edema   Data Reviewed: Basic Metabolic Panel:  Recent Labs Lab 05/30/13 0328 05/31/13 0335  NA 137 139  K 3.7 4.2  CL 99 105  CO2 25 29  GLUCOSE 91 92  BUN 16 13  CREATININE 1.35* 1.15*  CALCIUM 8.7 9.0   Liver Function Tests:  Recent Labs Lab 05/30/13 0328  AST 16  ALT 13  ALKPHOS 72  BILITOT 0.2*  PROT 6.5  ALBUMIN 2.8*   No results found for this basename: LIPASE, AMYLASE,  in the last 168 hours No results found for this basename: AMMONIA,  in the last 168 hours CBC:  Recent Labs Lab 05/30/13 0328 05/31/13 0335  WBC 11.8* 6.8  NEUTROABS 7.6  --   HGB 10.6* 10.5*  HCT 33.6* 34.0*  MCV 84.6 86.3  PLT 251 244   Cardiac Enzymes: No results found for this basename: CKTOTAL, CKMB, CKMBINDEX, TROPONINI,  in the last 168 hours BNP (last 3 results)  Recent Labs  10/08/12 1723 10/12/12 0530 01/23/13 1640  PROBNP 1356.0* 8213.0* 1346.0*   CBG: No results found for this basename: GLUCAP,  in the last 168 hours  Recent Results (from the past 240 hour(s))  CULTURE, BLOOD (ROUTINE X 2)     Status: None   Collection Time    05/30/13  7:17 AM      Result Value Range Status   Specimen Description BLOOD LEFT ANTECUBITAL   Final   Special Requests BOTTLES  DRAWN AEROBIC AND ANAEROBIC 5CC   Final   Culture  Setup Time     Final   Value: 05/30/2013 11:36     Performed at [facility]   Culture     Final   Value:        BLOOD CULTURE RECEIVED NO GROWTH TO DATE CULTURE WILL BE HELD FOR 5 DAYS BEFORE ISSUING A FINAL NEGATIVE REPORT     Performed at [facility]   Report Status PENDING   Incomplete  CULTURE, BLOOD (ROUTINE X 2)     Status: None   Collection Time    05/30/13  7:36 AM      Result Value Range Status   Specimen Description BLOOD RIGHT ANTECUBITAL   Final   Special Requests BOTTLES DRAWN AEROBIC AND ANAEROBIC 5CC   Final    Culture  Setup Time     Final   Value: 05/30/2013 11:36     Performed at [facility]   Culture     Final   Value:        BLOOD CULTURE RECEIVED NO GROWTH TO DATE CULTURE WILL BE HELD FOR 5 DAYS BEFORE ISSUING A FINAL NEGATIVE REPORT     Performed at [facility]   Report Status PENDING   Incomplete  MRSA PCR SCREENING     Status: None   Collection Time    05/30/13  9:54 AM      Result Value Range Status   MRSA by PCR NEGATIVE  NEGATIVE Final   Comment:            The GeneXpert MRSA Assay (FDA     approved for NASAL specimens     only), is one component of a     comprehensive MRSA colonization     surveillance program. It is not     intended to diagnose MRSA     infection nor to guide or     monitor treatment for     MRSA infections.  CULTURE, ROUTINE-ABSCESS     Status: None   Collection Time    05/30/13  3:22 PM      Result Value Range Status   Specimen Description OTHER   Final   Special Requests NONE   Final   Gram Stain     Final   Value: FEW WBC PRESENT,BOTH PMN AND MONONUCLEAR     NO SQUAMOUS EPITHELIAL CELLS SEEN     NO ORGANISMS SEEN     Performed at [facility]   Culture     Final   Value: NO GROWTH 1 DAY     Performed at [facility]   Report Status PENDING   Incomplete     Studies: Ct Abdomen Pelvis Wo Contrast  05/30/2013   *RADIOLOGY REPORT*  Clinical Data: Lower abdominal pain, vaginal pain since 05/29/2013, elevated white blood cell count.  CT ABDOMEN AND PELVIS WITHOUT CONTRAST  Technique:  Multidetector CT imaging of the abdomen and pelvis was performed following the standard protocol without intravenous contrast.  Comparison: Prior radiograph from 09/02/2012  Findings: Dependent atelectasis is present within the lung bases bilaterally.  Prominent extrapleural fat is seen on the left.  No pleural effusion appreciated.  There is cardiomegaly with prominent mitral valvular and aortic valvular calcifications.  No pericardial  effusion.  The liver is unremarkable.  The gallbladder is surgically absent. There is no biliary ductal dilatation.  The spleen and adrenal glands are within normal limits.  There is fatty infiltration of the pancreas.  The kidneys are atrophic in appearance bilaterally.  No nephrolithiasis or hydronephrosis.  No significant perinephric fat stranding.  Evaluation for renal mass is limited on this noncontrast examination.  There is no evidence of bowel obstruction.  No abnormal wall thickening enhancement is seen about the bowels to suggest underlying inflammation. A Richter-type periumbilical hernia containing a portion of the transverse colon is present.  No associated obstruction.  Circumferential wall thickening is seen within the bladder with multiple small associated diverticula.  There is mild associated fat stranding.  The vagina itself is grossly normal.  No free air or fluid is identified.  Loculated fluid collection measuring 6.5 x 3.4 x 7.1 cm is present within the subcutaneous fat just overlying the left paravertebral musculature just to the left of midline, at the level of the L3-4. This collection lies approximately 2 cm deep to the skin.  There is associated soft tissue stranding within the subcutaneous fat.  This finding is worrisome for infection.  No acute osseous abnormalities are identified.  Degenerative disc desiccation is seen at L4-5 and L5 S1.  Prominent atherosclerotic calcifications are seen throughout the intra-abdominal aorta and its branch vessels.  IMPRESSION:  1.  Loculated fluid collection within the subcutaneous fat of the lower back just to the left of midline at the level of L3-4. Finding is worrisome for infection. This collection would be amendable to image guided percutaneous drainage.  2.  Mild circumferential wall thickening within the bladder with multiple bladder diverticula.  Correlation with urinalysis to evaluate for possible cystitis is recommended.  3. Richter-type  paraumbilical hernia containing a portion of the transverse colon without associated obstruction.  4.  Hiatal hernia.  5.  Cardiomegaly.  Critical Value/emergent results were called by telephone at the time of interpretation on 05/30/2013 at 06:45 a.m. to Dr. Pricilla Loveless by Dr. Hoy Morn, who verbally acknowledged these results.   Original Report Authenticated By: Rise Mu, M.D.   US Aspiration  05/30/2013   *RADIOLOGY REPORT*  Clinical history:Fluid collection in the lower back subcutaneous tissues.  PROCEDURE(S): ULTRASOUND GUIDED ASPIRATION OF SUBCUTANEOUS FLUID COLLECTION  Physician: Rachelle Hora. Henn, MD  Medications:None  Moderate sedation time:None  Fluoroscopy time: None  Procedure:The procedure was explained to the patient.  The risks and benefits of the procedure were discussed and the patient's questions were addressed.  Informed consent was obtained from the patient.  The patient was placed in a right lateral decubitus position.  Ultrasound demonstrated a simple appearing fluid collection in the lower back subcutaneous tissue.  The skin was prepped and draped in a sterile fashion.  Skin was anesthetized with lidocaine.  5-French Yueh catheter was directed into the fluid collection with ultrasound guidance.  30 ml of cloudy yellow fluid was removed.  The entire collection was aspirated.  The catheter was removed without complication.  Findings:Simple appearing fluid collection within the subcutaneous tissues.  30 ml of fluid was removed.  Fluid was sent for Gram stain and culture.  Complications: None  Impression:Ultrasound guided aspiration of the lower back subcutaneous fluid collection.   Original Report Authenticated By: Richarda Overlie, M.D.    Scheduled Meds: . amLODipine  5 mg Oral q morning - 10a  . dipyridamole-aspirin  1 capsule Oral BID  . donepezil  10 mg Oral QHS  . enoxaparin (LOVENOX) injection  40 mg Subcutaneous Q24H  . ferrous sulfate  325 mg Oral Q breakfast  .  furosemide  40 mg  Oral Daily  . hydrALAZINE  10 mg Oral Q8H  . levothyroxine  150 mcg Oral QAC breakfast  . memantine  10 mg Oral BID  . pantoprazole  40 mg Oral Daily  . piperacillin-tazobactam (ZOSYN)  IV  3.375 g Intravenous Q8H  . potassium chloride SA  20 mEq Oral BID  . predniSONE  10 mg Oral QODAY   And  . predniSONE  15 mg Oral QODAY  . saccharomyces boulardii  250 mg Oral BID  . sertraline  50 mg Oral q morning - 10a  . sodium chloride  3 mL Intravenous Q12H  . vancomycin  1,250 mg Intravenous Q24H   Continuous Infusions:   Active Problems:   Hypertension   Chronic diastolic heart failure   UTI (lower urinary tract infection)   Paraspinal abscess   AKI (acute kidney injury)   Lactic acidosis    Time spent: 25    [facility]  [facility] Pager [phone]. If 7PM-7AM, please contact night-coverage at [email or web address], password [facility] 05/31/2013, 8:46 AM  LOS: 1 day

## 2013-05-31 NOTE — Progress Notes (Signed)
No pain in her back.  Afebrile and WBC normal.  Back with bandaid over aspiration site and no tenderness.  Fluid was yellow and cloudy, gram stain did not show any organisms and a few WBCs.    Assess:  Deep subcutaneous fluid collection of back concerning for abscess  Plan:  Wait for culture results.

## 2013-05-31 NOTE — Progress Notes (Signed)
Received from ICU, patient alert and oriented, no distress noted. L PIV infusing, site no s/s of swelling or infiltration. Right FA swollen/infiltrated from previous PIV as stated by RN report. Agreed on previous RN assessment.

## 2013-05-31 NOTE — Progress Notes (Signed)
CSW went to assess patient for return to [facility]. Patient sleeping peacefully and CSW did not want to disturb. Will try back later.  Masako Overall C. Owen Pagnotta MSW, LCSW [phone]

## 2013-06-01 DIAGNOSIS — I5030 Unspecified diastolic (congestive) heart failure: Secondary | ICD-10-CM

## 2013-06-01 DIAGNOSIS — I509 Heart failure, unspecified: Secondary | ICD-10-CM

## 2013-06-01 DIAGNOSIS — E039 Hypothyroidism, unspecified: Secondary | ICD-10-CM

## 2013-06-01 LAB — BASIC METABOLIC PANEL
BUN: 16 mg/dL (ref 6–23)
CO2: 28 mEq/L (ref 19–32)
Chloride: 104 mEq/L (ref 96–112)
Glucose, Bld: 88 mg/dL (ref 70–99)
Potassium: 3.8 mEq/L (ref 3.5–5.1)
Sodium: 139 mEq/L (ref 135–145)

## 2013-06-01 LAB — CBC
HCT: 32.8 % — ABNORMAL LOW (ref 36.0–46.0)
Hemoglobin: 10 g/dL — ABNORMAL LOW (ref 12.0–15.0)
MCH: 26.7 pg (ref 26.0–34.0)
MCHC: 30.5 g/dL (ref 30.0–36.0)
MCV: 87.5 fL (ref 78.0–100.0)
RBC: 3.75 MIL/uL — ABNORMAL LOW (ref 3.87–5.11)

## 2013-06-01 MED ORDER — SULFAMETHOXAZOLE-TRIMETHOPRIM 400-80 MG PO TABS: 1.0000 | ORAL_TABLET | Freq: Every day | ORAL | Status: DC

## 2013-06-01 MED ORDER — DOXYCYCLINE HYCLATE 100 MG PO CAPS: 100.0000 mg | ORAL_CAPSULE | Freq: Two times a day (BID) | ORAL | Status: DC

## 2013-06-01 MED ORDER — CEFUROXIME AXETIL 500 MG PO TABS: 500.0000 mg | ORAL_TABLET | Freq: Two times a day (BID) | ORAL | Status: DC

## 2013-06-01 MED ORDER — FUROSEMIDE 40 MG PO TABS: 40.0000 mg | ORAL_TABLET | Freq: Every morning | ORAL | Status: DC

## 2013-06-01 MED ORDER — FUROSEMIDE 20 MG PO TABS: 20.0000 mg | ORAL_TABLET | Freq: Every day | ORAL | Status: DC

## 2013-06-01 MED ORDER — CLOTRIMAZOLE 1 % EX CREA: TOPICAL_CREAM | Freq: Two times a day (BID) | CUTANEOUS | Status: DC

## 2013-06-01 NOTE — Discharge Summary (Signed)
Physician Discharge Summary  [name]:[id number] DOB: Apr 11, 1930 DOA: 05/30/2013  PCP: Cain Saupe, MD  Admit date: 05/30/2013 Discharge date: 06/01/2013  Time spent: >30 minutes  Recommendations for Outpatient Follow-up:  Follow-up Information   Please follow up. (PCP next week, call for appt upon discharge)       Follow up with ROSENBOWER,TODD J, MD. (in 2weeks, call for appt)    Specialty:  General Surgery   Contact information:   [address] [place] [address] [phone]      Followup labs -PCP/assisted living M.D. to follow up on aspirate from fluid collection next week -Bmet to followup on creatinine on 9/8  Discharge Diagnoses:  Active Problems:   Hypertension   Chronic diastolic heart failure   UTI (lower urinary tract infection)   Paraspinal abscess   AKI (acute kidney injury)   Lactic acidosis   Discharge Condition: Improved/stable  Diet recommendation: Heart healthy low Na  Filed Weights   05/30/13 0702 05/31/13 0400  Weight: 92.5 kg (203 lb 14.8 oz) 92.5 kg (203 lb 14.8 oz)    History of present illness:  [name] is a 79 y.o. female with history of multiple medical problems including recurrent UTIs, chronic steroid use, temporal arteritis, hypertension who presents with complaints of lower abdominal pain. She reports that the pain feels more like a stabbing vaginal pain-intermittent and severe. She denies any vaginal discharge. She denies fevers, chills. She was seen in the ED and a CT scan of her abdomen and pelvis was done and showed a loculated fluid collection within the subcutaneous fat of the lower back just to the left of the midline at the level of L3-4-finding worrisome for infection per radiology. Urinalysis was done and was consistent with infection, lactic acid level was 3.5. She was started on empiric antibiotics, surgery was consulted and patient admitted for further evaluation and management. Patient denies  any back pain and no swelling.   Hospital Course:  UTI (lower urinary tract infection)  -As discussed above, upon admission patient's urinalysis was consistent with a UTI. Urine cultures were obtained and she was placed on empiric antibiotics with Zosyn -She was noted to have lactic acidosis on admission but was hemodynamically stable. She was treated as above and had a followup recheck lactic acid which was within normal limits  -Her urine culture shows no growth to date. He has remained afebrile her white cell count today prior to discharge is 7.0. She's been discharged on oral antibiotics for her lumbar loculated fluid collection  as discussed below Left paralumbar loculated fluid collection/? abscess  -Upon admission patient had a CT scan which showed a loculated fluid collection within the subcutaneous fat of the lower back just to the left of the midline at the level of L3-4, 6.5 x 3.4 x 7.1 cm, Surgery consulted>> recommended IR for drainage  -She was started on empiric antibiotics with vanc and Zosyn  -s/p aspiration per IR and Gram Stain with FEW WBC PRESENT,BOTH PMN AND MONONUCLEAR, NO SQUAMOUS EPITHELIAL CELLS SEEN, NO ORGANISMS SEEN. NGTD- will follow  -The aspirate culture to date shows no growth I discussed patient with Dr. Abbey Chatters and from his standpoint okay to discharge and patient to followup with her PCP for culture results. -I discussed discharge  antibiotics with ID/Dr. Orvan Falconer and he recommends  doxycycline and Ceftin for 2 weeks and are for patient to follow back up with surgery. Hypertension  -Continue outpatient medications  AKI (acute kidney  injury)  -Initially,resolved but this patient on Van creatinine 1.49 today. Will hold Lasix for now and resume on 9/8 and a followup the meds and assisted living facility Chronic diastolic heart failure  -Remains compensated, Lasix to be resumed on 9/8 as above Lactic acidosis  -As discussed above in patient with probable UTI  and paralumbar loculated fluid collection/question abscess  -resolved on Empiric antibiotics  Chronic steroid use  -She is hemodynamically stable, will continue outpatient prednisone and follow    Consultants:  CCS and IR Procedures:  S/P aspiration of paralumbar fluid collection per IR    Discharge Exam: Filed Vitals:   06/01/13 0633  BP: 159/61  Pulse: 59  Temp: 97.9 F (36.6 C)  Resp: 16   Exam:  General: alert & oriented x 3 In NAD  Cardiovascular: RRR, nl S1 s2  Respiratory: CTAB  Abdomen: soft +BS NT/ND, no masses palpable  Extremities: No cyanosis and trace edema    Discharge Instructions  Discharge Orders   Future Appointments Provider Department Dept Phone   10/21/2013 1:30 PM Levert Feinstein, MD [facility] [phone]   Future Orders Complete By Expires   Diet - low sodium heart healthy  As directed    Increase activity slowly  As directed        Medication List         acetaminophen 500 MG tablet  Commonly known as:  TYLENOL  Take 500 mg by mouth every 6 (six) hours as needed for pain.     amLODipine 5 MG tablet  Commonly known as:  NORVASC  Take 5 mg by mouth every morning.     cefUROXime 500 MG tablet  Commonly known as:  CEFTIN  Take 1 tablet (500 mg total) by mouth 2 (two) times daily.     clotrimazole 1 % cream  Commonly known as:  LOTRIMIN  Apply topically 2 (two) times daily.     Cranberry 250 MG Caps  Take 500 mg by mouth 2 (two) times daily.     dipyridamole-aspirin 200-25 MG per 12 hr capsule  Commonly known as:  AGGRENOX  Take 1 capsule by mouth 2 (two) times daily.     donepezil 10 MG tablet  Commonly known as:  ARICEPT  Take 10 mg by mouth at bedtime.     doxycycline 100 MG capsule  Commonly known as:  VIBRAMYCIN  Take 1 capsule (100 mg total) by mouth 2 (two) times daily.     ferrous sulfate 325 (65 FE) MG tablet  Take 325 mg by mouth daily with breakfast.     Fiber Caps  Take by mouth daily.      furosemide 40 MG tablet  Commonly known as:  LASIX  Take 1 tablet (40 mg total) by mouth every morning.  Start taking on:  06/03/2013     guaiFENesin 100 MG/5ML Soln  Commonly known as:  ROBITUSSIN  Take 10 mLs by mouth every 6 (six) hours as needed.     hydrALAZINE 10 MG tablet  Commonly known as:  APRESOLINE  Take 1 tablet (10 mg total) by mouth every 8 (eight) hours.     levothyroxine 150 MCG tablet  Commonly known as:  SYNTHROID, LEVOTHROID  Take 150 mcg by mouth daily before breakfast.     loperamide 2 MG capsule  Commonly known as:  IMODIUM  Take 2 mg by mouth 4 (four) times daily as needed for diarrhea or loose stools.     magnesium hydroxide 400  MG/5ML suspension  Commonly known as:  MILK OF MAGNESIA  Take 30 mLs by mouth daily as needed for constipation.     NAMENDA XR 28 MG Cp24  Generic drug:  Memantine HCl ER  Take 1 capsule by mouth every morning.     omeprazole 20 MG capsule  Commonly known as:  PRILOSEC  Take 20 mg by mouth every morning.     ondansetron 8 MG disintegrating tablet  Commonly known as:  ZOFRAN-ODT  Take 1 tablet (8 mg total) by mouth every 8 (eight) hours as needed for nausea.     potassium chloride SA 20 MEQ tablet  Commonly known as:  K-DUR,KLOR-CON  Take 20 mEq by mouth 2 (two) times daily.     predniSONE 5 MG tablet  Commonly known as:  DELTASONE  Take 10-15 mg by mouth See admin instructions. Alternate between 10mg  and 15mg  every other day     saccharomyces boulardii 250 MG capsule  Commonly known as:  FLORASTOR  Take 250 mg by mouth 2 (two) times daily.     sertraline 50 MG tablet  Commonly known as:  ZOLOFT  Take 50 mg by mouth every morning.     sulfamethoxazole-trimethoprim 400-80 MG per tablet  Commonly known as:  BACTRIM,SEPTRA  Take 1 tablet by mouth at bedtime.  Start taking on:  06/15/2013     THERA/BETA-CAROTENE Tabs  Take 1 tablet by mouth every morning.       Allergies  Allergen Reactions  . Apap  [Acetaminophen]     Reaction unknown  . Ciprofloxacin Nausea And Vomiting  . Codeine Other (See Comments)    halucinations  . Demerol Other (See Comments)    halucinations  . Morphine And Related Other (See Comments)    halucinations  . Nitrofurantoin Monohyd Macro Other (See Comments)    Per MAR  . Pregabalin Other (See Comments)    unknown  . Septra [Bactrim] Other (See Comments)    Unknown cant tolerate DS      The results of significant diagnostics from this hospitalization (including imaging, microbiology, ancillary and laboratory) are listed below for reference.    Significant Diagnostic Studies: Ct Abdomen Pelvis Wo Contrast  05/30/2013   *RADIOLOGY REPORT*  Clinical Data: Lower abdominal pain, vaginal pain since 05/29/2013, elevated white blood cell count.  CT ABDOMEN AND PELVIS WITHOUT CONTRAST  Technique:  Multidetector CT imaging of the abdomen and pelvis was performed following the standard protocol without intravenous contrast.  Comparison: Prior radiograph from 09/02/2012  Findings: Dependent atelectasis is present within the lung bases bilaterally.  Prominent extrapleural fat is seen on the left.  No pleural effusion appreciated.  There is cardiomegaly with prominent mitral valvular and aortic valvular calcifications.  No pericardial effusion.  The liver is unremarkable.  The gallbladder is surgically absent. There is no biliary ductal dilatation.  The spleen and adrenal glands are within normal limits.  There is fatty infiltration of the pancreas.  The kidneys are atrophic in appearance bilaterally.  No nephrolithiasis or hydronephrosis.  No significant perinephric fat stranding.  Evaluation for renal mass is limited on this noncontrast examination.  There is no evidence of bowel obstruction.  No abnormal wall thickening enhancement is seen about the bowels to suggest underlying inflammation. A Richter-type periumbilical hernia containing a portion of the transverse colon is  present.  No associated obstruction.  Circumferential wall thickening is seen within the bladder with multiple small associated diverticula.  There is mild associated fat stranding.  The vagina itself is grossly normal.  No free air or fluid is identified.  Loculated fluid collection measuring 6.5 x 3.4 x 7.1 cm is present within the subcutaneous fat just overlying the left paravertebral musculature just to the left of midline, at the level of the L3-4. This collection lies approximately 2 cm deep to the skin.  There is associated soft tissue stranding within the subcutaneous fat.  This finding is worrisome for infection.  No acute osseous abnormalities are identified.  Degenerative disc desiccation is seen at L4-5 and L5 S1.  Prominent atherosclerotic calcifications are seen throughout the intra-abdominal aorta and its branch vessels.  IMPRESSION:  1.  Loculated fluid collection within the subcutaneous fat of the lower back just to the left of midline at the level of L3-4. Finding is worrisome for infection. This collection would be amendable to image guided percutaneous drainage.  2.  Mild circumferential wall thickening within the bladder with multiple bladder diverticula.  Correlation with urinalysis to evaluate for possible cystitis is recommended.  3. Richter-type paraumbilical hernia containing a portion of the transverse colon without associated obstruction.  4.  Hiatal hernia.  5.  Cardiomegaly.  Critical Value/emergent results were called by telephone at the time of interpretation on 05/30/2013 at 06:45 a.m. to Dr. Pricilla Loveless by Dr. Hoy Morn, who verbally acknowledged these results.   Original Report Authenticated By: Rise Mu, M.D.   US Aspiration  05/30/2013   *RADIOLOGY REPORT*  Clinical history:Fluid collection in the lower back subcutaneous tissues.  PROCEDURE(S): ULTRASOUND GUIDED ASPIRATION OF SUBCUTANEOUS FLUID COLLECTION  Physician: Rachelle Hora. Henn, MD  Medications:None   Moderate sedation time:None  Fluoroscopy time: None  Procedure:The procedure was explained to the patient.  The risks and benefits of the procedure were discussed and the patient's questions were addressed.  Informed consent was obtained from the patient.  The patient was placed in a right lateral decubitus position.  Ultrasound demonstrated a simple appearing fluid collection in the lower back subcutaneous tissue.  The skin was prepped and draped in a sterile fashion.  Skin was anesthetized with lidocaine.  5-French Yueh catheter was directed into the fluid collection with ultrasound guidance.  30 ml of cloudy yellow fluid was removed.  The entire collection was aspirated.  The catheter was removed without complication.  Findings:Simple appearing fluid collection within the subcutaneous tissues.  30 ml of fluid was removed.  Fluid was sent for Gram stain and culture.  Complications: None  Impression:Ultrasound guided aspiration of the lower back subcutaneous fluid collection.   Original Report Authenticated By: Richarda Overlie, M.D.    Microbiology: Recent Results (from the past 240 hour(s))  URINE CULTURE     Status: None   Collection Time    05/30/13  6:53 AM      Result Value Range Status   Specimen Description URINE, CATHETERIZED   Final   Special Requests NONE   Final   Culture  Setup Time     Final   Value: 05/30/2013 13:14     Performed at [facility] Count     Final   Value: NO GROWTH     Performed at [facility]   Culture     Final   Value: NO GROWTH     Performed at [facility]   Report Status 05/31/2013 FINAL   Final  CULTURE, BLOOD (ROUTINE X 2)     Status: None   Collection Time    05/30/13  7:17 AM      Result Value Range Status   Specimen Description BLOOD LEFT ANTECUBITAL   Final   Special Requests BOTTLES DRAWN AEROBIC AND ANAEROBIC 5CC   Final   Culture  Setup Time     Final   Value: 05/30/2013 11:36     Performed at [facility]    Culture     Final   Value:        BLOOD CULTURE RECEIVED NO GROWTH TO DATE CULTURE WILL BE HELD FOR 5 DAYS BEFORE ISSUING A FINAL NEGATIVE REPORT     Performed at [facility]   Report Status PENDING   Incomplete  CULTURE, BLOOD (ROUTINE X 2)     Status: None   Collection Time    05/30/13  7:36 AM      Result Value Range Status   Specimen Description BLOOD RIGHT ANTECUBITAL   Final   Special Requests BOTTLES DRAWN AEROBIC AND ANAEROBIC 5CC   Final   Culture  Setup Time     Final   Value: 05/30/2013 11:36     Performed at [facility]   Culture     Final   Value:        BLOOD CULTURE RECEIVED NO GROWTH TO DATE CULTURE WILL BE HELD FOR 5 DAYS BEFORE ISSUING A FINAL NEGATIVE REPORT     Performed at [facility]   Report Status PENDING   Incomplete  MRSA PCR SCREENING     Status: None   Collection Time    05/30/13  9:54 AM      Result Value Range Status   MRSA by PCR NEGATIVE  NEGATIVE Final   Comment:            The GeneXpert MRSA Assay (FDA     approved for NASAL specimens     only), is one component of a     comprehensive MRSA colonization     surveillance program. It is not     intended to diagnose MRSA     infection nor to guide or     monitor treatment for     MRSA infections.  CULTURE, ROUTINE-ABSCESS     Status: None   Collection Time    05/30/13  3:22 PM      Result Value Range Status   Specimen Description OTHER   Final   Special Requests NONE   Final   Gram Stain     Final   Value: FEW WBC PRESENT,BOTH PMN AND MONONUCLEAR     NO SQUAMOUS EPITHELIAL CELLS SEEN     NO ORGANISMS SEEN     Performed at [facility]   Culture     Final   Value: NO GROWTH 2 DAYS     Performed at [facility]   Report Status PENDING   Incomplete     Labs: Basic Metabolic Panel:  Recent Labs Lab 05/30/13 0328 05/31/13 0335 06/01/13 0520  NA 137 139 139  K 3.7 4.2 3.8  CL 99 105 104  CO2 25 29 28   GLUCOSE 91 92 88  BUN 16 13 16    CREATININE 1.35* 1.15* 1.49*  CALCIUM 8.7 9.0 8.6   Liver Function Tests:  Recent Labs Lab 05/30/13 0328  AST 16  ALT 13  ALKPHOS 72  BILITOT 0.2*  PROT 6.5  ALBUMIN 2.8*   No results found for this basename: LIPASE, AMYLASE,  in the last 168 hours No results found for  this basename: AMMONIA,  in the last 168 hours CBC:  Recent Labs Lab 05/30/13 0328 05/31/13 0335 06/01/13 0520  WBC 11.8* 6.8 7.0  NEUTROABS 7.6  --   --   HGB 10.6* 10.5* 10.0*  HCT 33.6* 34.0* 32.8*  MCV 84.6 86.3 87.5  PLT 251 244 224   Cardiac Enzymes: No results found for this basename: CKTOTAL, CKMB, CKMBINDEX, TROPONINI,  in the last 168 hours BNP: BNP (last 3 results)  Recent Labs  10/08/12 1723 10/12/12 0530 01/23/13 1640  PROBNP 1356.0* 8213.0* 1346.0*   CBG: No results found for this basename: GLUCAP,  in the last 168 hours     Signed:  Essense Bousquet C  [facility] 06/01/2013, 1:20 PM

## 2013-06-01 NOTE — Progress Notes (Signed)
Per MD, Pt ready for d/c.  Notified RN, Pt and facility.  Pt notified her daughter.  Sent d/c summary and FL2.  Confirmed receipt of d/c summary and FL2.  Facility ready to receive Pt.  Daughter to provide transportation.  Providence Crosby, LCSWA Clinical Social Work [phone]

## 2013-06-01 NOTE — Evaluation (Signed)
Occupational Therapy Evaluation Patient Details Name: [name] MRN: [id number] DOB: [birth year] Today's Date: 06/01/2013 Time: 7829-5621 OT Time Calculation (min): 27 min  OT Assessment / Plan / Recommendation History of present illness 79 y.o. female admitted with low abd pain, UTI, hpertension, L paralumbar abscess-s/p aspiration 9/4. History of multiple medical problems including recurrent UTIs, chronic steroid use, temporal arteritis, hypertension who presents with complaints of lower abdominal pain. She reports that the pain feels more like a stabbing vaginal pain-intermittent and severe.   Clinical Impression   Pt was admitted with the above conditions.  She states that her ALF, [facility], provides assistance for all adls and ambulating to bathroom and dining room.  Pt may be near baseline; if pt has declined in function, ALf can screen for an OT consult.      OT Assessment  All further OT needs can be met in the next venue of care (HHOT if pt not at baseline of functioning)    Follow Up Recommendations  Home health OT;No OT follow up (depending upon baseline of functioning:  facility can screen)    Barriers to Discharge      Equipment Recommendations    None recommended by OT   Recommendations for Other Services    Frequency       Precautions / Restrictions Precautions Precautions: Fall Precaution Comments: O2 dependent Restrictions Weight Bearing Restrictions: No   Pertinent Vitals/Pain No c/o pain.  Pt not orthostatic/no dizziness:  Vitals listed below    ADL  Upper Body Dressing: Minimal assistance Where Assessed - Upper Body Dressing: Unsupported sitting Lower Body Dressing: Maximal assistance Where Assessed - Lower Body Dressing: Supported sit to stand Toilet Transfer: Moderate assistance (sit to stand, min A for steps to 3:1 commode) Toilet Transfer Method: Stand pivot Toilet Transfer Equipment: Bedside commode Toileting - Clothing Manipulation and  Hygiene: +1 Total assistance Where Assessed - Toileting Clothing Manipulation and Hygiene: Standing Transfers/Ambulation Related to ADLs: ambulated around bed with min A once she was standing up.  No dizziness.  sitting 101/50 standing 136/65 ADL Comments: Pt has a h/o dementia--able to answer most questions, difficulty with time and timeline of how long she has been in ALF.  She feels she is near baseline as she calls for assist for going to bathroom, etc.  Pt states she has been incontinent x urine/bowels and she usually wears depends type of garments.  Pt was incontinent x urine when I arrived.  Assisted to wash up and get a clean gown on.      OT Diagnosis: Generalized weakness  OT Problem List:   OT Treatment Interventions:     OT Goals(Current goals can be found in the care plan section) Acute Rehab OT Goals Patient Stated Goal: to feel better  Visit Information  Last OT Received On: 06/01/13 Assistance Needed: +1 History of Present Illness: 79 y.o. female admitted with low abd pain, UTI, hpertension, L paralumbar abscess-s/p aspiration 9/4. History of multiple medical problems including recurrent UTIs, chronic steroid use, temporal arteritis, hypertension who presents with complaints of lower abdominal pain. She reports that the pain feels more like a stabbing vaginal pain-intermittent and severe.       Prior Functioning     Home Living Family/patient expects to be discharged to:: Assisted living Home Equipment: Dan Humphreys - 2 wheels;Wheelchair - manual Additional Comments: used RW for short distances, WC for longer distances Prior Function Level of Independence: Needs assistance ADL's / Homemaking Assistance Needed: assistance with bathing, dressing.  gets into shower.  Communication Communication: No difficulties         Vision/Perception     Cognition  Cognition Arousal/Alertness: Awake/alert Behavior During Therapy: WFL for tasks assessed/performed Overall Cognitive  Status: History of cognitive impairments - at baseline Memory: Decreased short-term memory    Extremity/Trunk Assessment Upper Extremity Assessment Upper Extremity Assessment: Generalized weakness Lower Extremity Assessment Lower Extremity Assessment: Generalized weakness Cervical / Trunk Assessment Cervical / Trunk Assessment: Kyphotic     Mobility Bed Mobility Bed Mobility: Supine to Sit Supine to Sit: HOB elevated;With rails;4: Min assist Details for Bed Mobility Assistance: Increased time. assist for trunk to full upright and LEs.  Transfers Sit to Stand: 4: Min assist;3: Mod assist;From bed;From chair/3-in-1;With upper extremity assist (mod A from recliner) Stand to Sit: 4: Min assist;To chair/3-in-1 Details for Transfer Assistance: x2. VCs safety, technique, hand placement. Assist to rise, stabilize, control descent, maneuver with RW     Exercise     Balance     End of Session OT - End of Session Activity Tolerance: Patient tolerated treatment well Patient left: in bed;with call bell/phone within reach  GO     Madelyn Tlatelpa 06/01/2013, 1:21 PM Marica Otter, OTR/L [phone] 06/01/2013

## 2013-06-01 NOTE — Progress Notes (Signed)
Report called to [facility] @ [facility].

## 2013-06-01 NOTE — Evaluation (Signed)
Physical Therapy Evaluation Patient Details Name: [name] MRN: [id number] DOB: 1930-05-31 Today's Date: 06/01/2013 Time: 4098-1191 PT Time Calculation (min): 24 min  PT Assessment / Plan / Recommendation History of Present Illness  79 y.o. female admitted with low abd pain, UTI, hpertension, L paralumbar abscess-s/p aspiration 9/4. History of multiple medical problems including recurrent UTIs, chronic steroid use, temporal arteritis, hypertension who presents with complaints of lower abdominal pain. She reports that the pain feels more like a stabbing vaginal pain-intermittent and severe.  Clinical Impression  On eval, pt required Min assist for mobility-able to take a few steps in room with RW. Deferred further ambulation due to pt c/o dizziness. Remained on O2 during session. Pt is deconditioned. Recommend HHPT at ALF as long as facility can provide current level of care. If facility unable, then pt may need SNF.     PT Assessment  Patient needs continued PT services    Follow Up Recommendations  Home health PT;Supervision/Assistance - 24 hour (at ALF as long as facility can provide current level of care)    Does the patient have the potential to tolerate intense rehabilitation      Barriers to Discharge        Equipment Recommendations  None recommended by PT    Recommendations for Other Services OT consult   Frequency Min 3X/week    Precautions / Restrictions Precautions Precautions: Fall Precaution Comments: O2 dependent Restrictions Weight Bearing Restrictions: No   Pertinent Vitals/Pain No c/o pain      Mobility  Bed Mobility Bed Mobility: Supine to Sit Supine to Sit: HOB elevated;With rails;4: Min assist Details for Bed Mobility Assistance: Increased time. assist for trunk to full upright and LEs.  Transfers Transfers: Sit to Stand;Stand to [employer] Transfers Sit to Stand: 4: Min assist;From chair/3-in-1;From bed Stand to Sit: 4: Min assist;To  chair/3-in-1 Stand Pivot Transfers: 4: Min assist Details for Transfer Assistance: x2. VCs safety, technique, hand placement. Assist to rise, stabilize, control descent, maneuver with RW Ambulation/Gait Ambulation/Gait Assistance: 4: Min assist Ambulation Distance (Feet): 4 Feet Assistive device: Rolling walker Ambulation/Gait Assistance Details: Only able to take a few steps in room due to dizziness.  Gait Pattern: Step-through pattern;Decreased stride length;Decreased step length - left;Trunk flexed    Exercises     PT Diagnosis: Difficulty walking;Abnormality of gait;Generalized weakness  PT Problem List: Decreased strength;Decreased activity tolerance;Decreased balance;Decreased mobility;Decreased knowledge of use of DME PT Treatment Interventions: DME instruction;Gait training;Functional mobility training;Therapeutic exercise;Therapeutic activities;Balance training;Patient/family education     PT Goals(Current goals can be found in the care plan section) Acute Rehab PT Goals Patient Stated Goal: to feel better PT Goal Formulation: With patient Time For Goal Achievement: 06/15/13 Potential to Achieve Goals: Good  Visit Information  Last PT Received On: 06/01/13 Assistance Needed: +1 History of Present Illness: 79 y.o. female admitted with low abd pain, UTI, hpertension, L paralumbar abscess-s/p aspiration 9/4. History of multiple medical problems including recurrent UTIs, chronic steroid use, temporal arteritis, hypertension who presents with complaints of lower abdominal pain. She reports that the pain feels more like a stabbing vaginal pain-intermittent and severe.       Prior Functioning  Home Living Family/patient expects to be discharged to:: Assisted living Home Equipment: Dan Humphreys - 2 wheels;Wheelchair - manual Additional Comments: used RW for short distances, WC for longer distances Prior Function Level of Independence: Needs assistance ADL's / Homemaking Assistance  Needed: assistance with bathing, dressing. gets into shower.  Communication Communication: No difficulties  Cognition  Cognition Arousal/Alertness: Awake/alert Behavior During Therapy: WFL for tasks assessed/performed Overall Cognitive Status: Within Functional Limits for tasks assessed    Extremity/Trunk Assessment Upper Extremity Assessment Upper Extremity Assessment: Defer to OT evaluation Lower Extremity Assessment Lower Extremity Assessment: Generalized weakness Cervical / Trunk Assessment Cervical / Trunk Assessment: Kyphotic   Balance    End of Session PT - End of Session Equipment Utilized During Treatment: Oxygen Activity Tolerance: Patient limited by fatigue (Limited by dizziness) Patient left: in chair;with call bell/phone within reach  GP     Rebeca Alert, MPT Pager: [phone]

## 2013-06-01 NOTE — Progress Notes (Signed)
Still has no pain in her back.  Afebrile and WBC normal.  Back with no tenderness.  Fluid was yellow and cloudy, gram stain did not show any organisms and a few WBCs, culture is pending.  Assess:  Deep subcutaneous fluid collection of back concerning for abscess  Plan:  Wait for culture results.

## 2013-06-01 NOTE — Progress Notes (Addendum)
   CARE MANAGEMENT NOTE 06/02/2013  Patient:  SOLINA, HERON A   Account Number:  [id number]  Date Initiated:  05/30/2013  Documentation initiated by:  DAVIS,RHONDA  Subjective/Objective Assessment:   pt with hx of multiple uti's sent from alf for sharp pains in the vaginal and abd area, suspicious fld collection in the peritoneal area.     Action/Plan:   to return to alf-[facility] once medically stable   Anticipated DC Date:  06/02/2013   Anticipated DC Plan:  ASSISTED LIVING / REST HOME  In-house referral  Clinical Social Worker      DC Planning Services  NA      [facility]  HOME [facility]   Choice offered to / List presented to:  C-1 Patient   DME arranged  NA        HH arranged  HH-2 PT      HH agency  [facility]   Status of service:  Completed, signed off Medicare Important Message given?  NA - LOS <3 / Initial given by admissions (If response is "NO", the following Medicare IM given date fields will be blank) Date Medicare IM given:   Date Additional Medicare IM given:    Discharge Disposition:  ASSISTED LIVING  Per UR Regulation:  Reviewed for med. necessity/level of care/duration of stay  If discussed at Long Length of Stay Meetings, dates discussed:    Comments:  06/01/2013 1430 NCM spoke to pt and offered choice. Contacted [employer] and pt was set up with [facility] in the past. Notified [facility] of [facility] PT need. Referral accepted by [facility].  Isidoro Donning RN CCM Case Mgmt phone [phone]  [phone] Stark Jock, BSN, [place] [phone] Chart Reviewed for discharge and hospital needs. Discharge needs at time of review:  None Review of patient progress due on [id number].

## 2013-06-01 NOTE — Progress Notes (Signed)
Clinical Social Work Department BRIEF PSYCHOSOCIAL ASSESSMENT 06/01/2013  Patient:  [name], [name]     Account Number:  [id number]     Admit date:  05/30/2013  Clinical Social Worker:  Doroteo Glassman  Date/Time:  06/01/2013 01:18 PM  Referred by:  Physician  Date Referred:  06/01/2013 Referred for  ALF Placement   Other Referral:   Interview type:  Patient Other interview type:    PSYCHOSOCIAL DATA Living Status:  FACILITY Admitted from facility:   Level of care:  Assisted Living Primary support name:  [name] Primary support relationship to patient:  CHILD, ADULT Degree of support available:   strong    CURRENT CONCERNS Current Concerns  Post-Acute Placement   Other Concerns:    SOCIAL WORK ASSESSMENT / PLAN Per MD, Pt ready for d/c.    Spoke with Pt about d/c plans.  Pt confirmed that she is from [facility] and she stated that she's ready to return.  Pt informed CSW that her daughter is aware that she's ready for d/c and that her daughter will be providing transporatation.    CSW thanked Pt for her time.   Assessment/plan status:  No Further Intervention Required Other assessment/ plan:   Information/referral to community resources:   n/a    PATIENT'S/FAMILY'S RESPONSE TO PLAN OF CARE: Pt is happy that she's leaving the hospital and she's happy to be returning to [facility].    Pt thanked CSW for time and assistance.   Providence Crosby, LCSWA Clinical Social Work [phone]

## 2013-06-03 LAB — CULTURE, ROUTINE-ABSCESS

## 2013-06-05 LAB — CULTURE, BLOOD (ROUTINE X 2)

## 2013-07-19 ENCOUNTER — Emergency Department (HOSPITAL_COMMUNITY): Payer: Medicare Other

## 2013-07-19 ENCOUNTER — Inpatient Hospital Stay (HOSPITAL_COMMUNITY): Payer: Medicare Other

## 2013-07-19 ENCOUNTER — Encounter (HOSPITAL_COMMUNITY): Payer: Self-pay | Admitting: Emergency Medicine

## 2013-07-19 ENCOUNTER — Inpatient Hospital Stay (HOSPITAL_COMMUNITY)
Admission: EM | Admit: 2013-07-19 | Discharge: 2013-07-24 | DRG: 372 | Disposition: A | Discharge status: Nursing Home (Includes ICF) | Payer: Medicare Other | Attending: Internal Medicine | Admitting: Internal Medicine

## 2013-07-19 DIAGNOSIS — E876 Hypokalemia: Secondary | ICD-10-CM | POA: Diagnosis present

## 2013-07-19 DIAGNOSIS — Z87440 Personal history of urinary (tract) infections: Secondary | ICD-10-CM

## 2013-07-19 DIAGNOSIS — R531 Weakness: Secondary | ICD-10-CM | POA: Diagnosis present

## 2013-07-19 DIAGNOSIS — IMO0002 Reserved for concepts with insufficient information to code with codable children: Secondary | ICD-10-CM

## 2013-07-19 DIAGNOSIS — E860 Dehydration: Secondary | ICD-10-CM | POA: Diagnosis present

## 2013-07-19 DIAGNOSIS — F3289 Other specified depressive episodes: Secondary | ICD-10-CM | POA: Diagnosis present

## 2013-07-19 DIAGNOSIS — M316 Other giant cell arteritis: Secondary | ICD-10-CM | POA: Diagnosis present

## 2013-07-19 DIAGNOSIS — E785 Hyperlipidemia, unspecified: Secondary | ICD-10-CM | POA: Diagnosis present

## 2013-07-19 DIAGNOSIS — N179 Acute kidney failure, unspecified: Secondary | ICD-10-CM | POA: Diagnosis present

## 2013-07-19 DIAGNOSIS — I5032 Chronic diastolic (congestive) heart failure: Secondary | ICD-10-CM

## 2013-07-19 DIAGNOSIS — R52 Pain, unspecified: Secondary | ICD-10-CM

## 2013-07-19 DIAGNOSIS — K219 Gastro-esophageal reflux disease without esophagitis: Secondary | ICD-10-CM | POA: Diagnosis present

## 2013-07-19 DIAGNOSIS — D649 Anemia, unspecified: Secondary | ICD-10-CM | POA: Diagnosis present

## 2013-07-19 DIAGNOSIS — Z66 Do not resuscitate: Secondary | ICD-10-CM | POA: Diagnosis present

## 2013-07-19 DIAGNOSIS — Z79899 Other long term (current) drug therapy: Secondary | ICD-10-CM

## 2013-07-19 DIAGNOSIS — R1032 Left lower quadrant pain: Secondary | ICD-10-CM

## 2013-07-19 DIAGNOSIS — D72829 Elevated white blood cell count, unspecified: Secondary | ICD-10-CM | POA: Diagnosis present

## 2013-07-19 DIAGNOSIS — G609 Hereditary and idiopathic neuropathy, unspecified: Secondary | ICD-10-CM | POA: Diagnosis present

## 2013-07-19 DIAGNOSIS — I5030 Unspecified diastolic (congestive) heart failure: Secondary | ICD-10-CM | POA: Diagnosis present

## 2013-07-19 DIAGNOSIS — I43 Cardiomyopathy in diseases classified elsewhere: Secondary | ICD-10-CM | POA: Diagnosis present

## 2013-07-19 DIAGNOSIS — F0390 Unspecified dementia without behavioral disturbance: Secondary | ICD-10-CM

## 2013-07-19 DIAGNOSIS — R600 Localized edema: Secondary | ICD-10-CM

## 2013-07-19 DIAGNOSIS — A0472 Enterocolitis due to Clostridium difficile, not specified as recurrent: Principal | ICD-10-CM

## 2013-07-19 DIAGNOSIS — I509 Heart failure, unspecified: Secondary | ICD-10-CM | POA: Diagnosis present

## 2013-07-19 DIAGNOSIS — I130 Hypertensive heart and chronic kidney disease with heart failure and stage 1 through stage 4 chronic kidney disease, or unspecified chronic kidney disease: Secondary | ICD-10-CM | POA: Diagnosis present

## 2013-07-19 DIAGNOSIS — I10 Essential (primary) hypertension: Secondary | ICD-10-CM | POA: Diagnosis present

## 2013-07-19 DIAGNOSIS — R188 Other ascites: Secondary | ICD-10-CM | POA: Diagnosis present

## 2013-07-19 DIAGNOSIS — M4620 Osteomyelitis of vertebra, site unspecified: Secondary | ICD-10-CM

## 2013-07-19 DIAGNOSIS — D509 Iron deficiency anemia, unspecified: Secondary | ICD-10-CM | POA: Diagnosis present

## 2013-07-19 DIAGNOSIS — I4891 Unspecified atrial fibrillation: Secondary | ICD-10-CM | POA: Diagnosis present

## 2013-07-19 DIAGNOSIS — F329 Major depressive disorder, single episode, unspecified: Secondary | ICD-10-CM | POA: Diagnosis present

## 2013-07-19 DIAGNOSIS — Z9981 Dependence on supplemental oxygen: Secondary | ICD-10-CM

## 2013-07-19 DIAGNOSIS — N183 Chronic kidney disease, stage 3 unspecified: Secondary | ICD-10-CM

## 2013-07-19 DIAGNOSIS — R112 Nausea with vomiting, unspecified: Secondary | ICD-10-CM

## 2013-07-19 DIAGNOSIS — Z8673 Personal history of transient ischemic attack (TIA), and cerebral infarction without residual deficits: Secondary | ICD-10-CM

## 2013-07-19 DIAGNOSIS — E039 Hypothyroidism, unspecified: Secondary | ICD-10-CM | POA: Diagnosis present

## 2013-07-19 DIAGNOSIS — B370 Candidal stomatitis: Secondary | ICD-10-CM | POA: Diagnosis present

## 2013-07-19 LAB — URINALYSIS, ROUTINE W REFLEX MICROSCOPIC
Bilirubin Urine: NEGATIVE
Hgb urine dipstick: NEGATIVE
Nitrite: NEGATIVE
Protein, ur: NEGATIVE mg/dL
Urobilinogen, UA: 0.2 mg/dL (ref 0.0–1.0)
pH: 5.5 (ref 5.0–8.0)

## 2013-07-19 LAB — COMPREHENSIVE METABOLIC PANEL
ALT: 15 U/L (ref 0–35)
AST: 16 U/L (ref 0–37)
BUN: 20 mg/dL (ref 6–23)
CO2: 27 mEq/L (ref 19–32)
Chloride: 97 mEq/L (ref 96–112)
Creatinine, Ser: 1.32 mg/dL — ABNORMAL HIGH (ref 0.50–1.10)
GFR calc Af Amer: 43 mL/min — ABNORMAL LOW (ref >90)
GFR calc non Af Amer: 37 mL/min — ABNORMAL LOW (ref >90)
Glucose, Bld: 113 mg/dL — ABNORMAL HIGH (ref 70–99)
Total Bilirubin: 0.2 mg/dL — ABNORMAL LOW (ref 0.3–1.2)
Total Protein: 7.1 g/dL (ref 6.0–8.3)

## 2013-07-19 LAB — CBC WITH DIFFERENTIAL/PLATELET
Basophils Absolute: 0 10*3/uL (ref 0.0–0.1)
Basophils Relative: 0 % (ref 0–1)
Eosinophils Relative: 0 % (ref 0–5)
HCT: 39.3 % (ref 36.0–46.0)
Hemoglobin: 12.4 g/dL (ref 12.0–15.0)
Lymphs Abs: 1.6 10*3/uL (ref 0.7–4.0)
MCH: 27.8 pg (ref 26.0–34.0)
MCV: 88.1 fL (ref 78.0–100.0)
Monocytes Relative: 5 % (ref 3–12)
Neutro Abs: 24 10*3/uL — ABNORMAL HIGH (ref 1.7–7.7)
Platelets: 238 10*3/uL (ref 150–400)
RBC: 4.46 MIL/uL (ref 3.87–5.11)
WBC: 27 10*3/uL — ABNORMAL HIGH (ref 4.0–10.5)

## 2013-07-19 LAB — URINE MICROSCOPIC-ADD ON

## 2013-07-19 MED ORDER — VANCOMYCIN HCL IN DEXTROSE 750-5 MG/150ML-% IV SOLN
750.0000 mg | Freq: Two times a day (BID) | INTRAVENOUS | Status: DC
  Administered 2013-07-19 – 2013-07-21 (×4): 750 mg via INTRAVENOUS
  Filled 2013-07-19 (×5): qty 150

## 2013-07-19 MED ORDER — FLUCONAZOLE 100 MG PO TABS
100.0000 mg | ORAL_TABLET | Freq: Every day | ORAL | Status: DC
  Administered 2013-07-19 – 2013-07-20 (×2): 100 mg via ORAL
  Filled 2013-07-19 (×3): qty 1

## 2013-07-19 MED ORDER — FUROSEMIDE 40 MG PO TABS
40.0000 mg | ORAL_TABLET | Freq: Every morning | ORAL | Status: DC
Start: 2013-07-19 — End: 2013-07-23
  Administered 2013-07-19 – 2013-07-22 (×4): 40 mg via ORAL
  Filled 2013-07-19 (×5): qty 1

## 2013-07-19 MED ORDER — ACETAMINOPHEN 500 MG PO TABS: 500.0000 mg | ORAL_TABLET | Freq: Four times a day (QID) | ORAL | Status: DC | PRN

## 2013-07-19 MED ORDER — KCL IN DEXTROSE-NACL 10-5-0.45 MEQ/L-%-% IV SOLN
INTRAVENOUS | Status: AC
  Administered 2013-07-19: 1000 mL via INTRAVENOUS
  Filled 2013-07-19 (×2): qty 1000

## 2013-07-19 MED ORDER — PANTOPRAZOLE SODIUM 40 MG PO TBEC
40.0000 mg | DELAYED_RELEASE_TABLET | Freq: Every day | ORAL | Status: DC
  Administered 2013-07-19 – 2013-07-24 (×6): 40 mg via ORAL
  Filled 2013-07-19 (×6): qty 1

## 2013-07-19 MED ORDER — SODIUM CHLORIDE 0.9 % IV BOLUS (SEPSIS)
500.0000 mL | Freq: Once | INTRAVENOUS | Status: AC
  Administered 2013-07-19: 500 mL via INTRAVENOUS

## 2013-07-19 MED ORDER — SODIUM CHLORIDE 0.9 % IV SOLN
Freq: Once | INTRAVENOUS | Status: AC
  Administered 2013-07-19: 06:00:00 via INTRAVENOUS

## 2013-07-19 MED ORDER — DONEPEZIL HCL 10 MG PO TABS
10.0000 mg | ORAL_TABLET | Freq: Every day | ORAL | Status: DC
  Administered 2013-07-19 – 2013-07-23 (×5): 10 mg via ORAL
  Filled 2013-07-19 (×6): qty 1

## 2013-07-19 MED ORDER — PREDNISONE 10 MG PO TABS
10.0000 mg | ORAL_TABLET | ORAL | Status: DC
  Administered 2013-07-19 – 2013-07-23 (×3): 10 mg via ORAL
  Filled 2013-07-19 (×3): qty 1

## 2013-07-19 MED ORDER — ONDANSETRON HCL 4 MG PO TABS: 4.0000 mg | ORAL_TABLET | Freq: Four times a day (QID) | ORAL | Status: DC | PRN

## 2013-07-19 MED ORDER — PREDNISONE 10 MG PO TABS: 10.0000 mg | ORAL_TABLET | ORAL | Status: DC

## 2013-07-19 MED ORDER — SACCHAROMYCES BOULARDII 250 MG PO CAPS
250.0000 mg | ORAL_CAPSULE | Freq: Two times a day (BID) | ORAL | Status: DC
  Administered 2013-07-19 – 2013-07-24 (×10): 250 mg via ORAL
  Filled 2013-07-19 (×11): qty 1

## 2013-07-19 MED ORDER — FERROUS SULFATE 325 (65 FE) MG PO TABS
325.0000 mg | ORAL_TABLET | Freq: Every day | ORAL | Status: DC
  Administered 2013-07-19 – 2013-07-24 (×6): 325 mg via ORAL
  Filled 2013-07-19 (×7): qty 1

## 2013-07-19 MED ORDER — CEFTRIAXONE SODIUM 1 G IJ SOLR
1.0000 g | Freq: Once | INTRAMUSCULAR | Status: AC
  Administered 2013-07-19: 1 g via INTRAVENOUS
  Filled 2013-07-19: qty 10

## 2013-07-19 MED ORDER — HYDRALAZINE HCL 10 MG PO TABS
10.0000 mg | ORAL_TABLET | Freq: Three times a day (TID) | ORAL | Status: DC
  Administered 2013-07-19 – 2013-07-24 (×11): 10 mg via ORAL
  Filled 2013-07-19 (×18): qty 1

## 2013-07-19 MED ORDER — PREDNISONE 5 MG PO TABS
15.0000 mg | ORAL_TABLET | ORAL | Status: DC
  Administered 2013-07-20 – 2013-07-24 (×3): 15 mg via ORAL
  Filled 2013-07-19 (×3): qty 1

## 2013-07-19 MED ORDER — SERTRALINE HCL 50 MG PO TABS
50.0000 mg | ORAL_TABLET | Freq: Every morning | ORAL | Status: DC
  Administered 2013-07-19 – 2013-07-24 (×6): 50 mg via ORAL
  Filled 2013-07-19 (×6): qty 1

## 2013-07-19 MED ORDER — METRONIDAZOLE IN NACL 5-0.79 MG/ML-% IV SOLN
500.0000 mg | Freq: Once | INTRAVENOUS | Status: AC
  Administered 2013-07-19: 500 mg via INTRAVENOUS
  Filled 2013-07-19: qty 100

## 2013-07-19 MED ORDER — ASPIRIN-DIPYRIDAMOLE ER 25-200 MG PO CP12
1.0000 | ORAL_CAPSULE | Freq: Two times a day (BID) | ORAL | Status: DC
  Administered 2013-07-19 – 2013-07-24 (×10): 1 via ORAL
  Filled 2013-07-19 (×11): qty 1

## 2013-07-19 MED ORDER — ENOXAPARIN SODIUM 40 MG/0.4ML ~~LOC~~ SOLN
40.0000 mg | SUBCUTANEOUS | Status: DC
  Administered 2013-07-19 – 2013-07-23 (×5): 40 mg via SUBCUTANEOUS
  Filled 2013-07-19 (×6): qty 0.4

## 2013-07-19 MED ORDER — GATIFLOXACIN 0.5 % OP SOLN: 1.0000 [drp] | Freq: Four times a day (QID) | OPHTHALMIC | Status: DC

## 2013-07-19 MED ORDER — ONDANSETRON HCL 4 MG/2ML IJ SOLN: 4.0000 mg | Freq: Four times a day (QID) | INTRAMUSCULAR | Status: DC | PRN

## 2013-07-19 MED ORDER — DIPHENHYDRAMINE HCL 50 MG/ML IJ SOLN
25.0000 mg | Freq: Four times a day (QID) | INTRAMUSCULAR | Status: DC | PRN
  Administered 2013-07-19 – 2013-07-21 (×2): 25 mg via INTRAVENOUS
  Filled 2013-07-19 (×2): qty 1

## 2013-07-19 MED ORDER — AMLODIPINE BESYLATE 5 MG PO TABS
5.0000 mg | ORAL_TABLET | Freq: Every morning | ORAL | Status: DC
  Administered 2013-07-19 – 2013-07-24 (×6): 5 mg via ORAL
  Filled 2013-07-19 (×6): qty 1

## 2013-07-19 MED ORDER — LEVOTHYROXINE SODIUM 150 MCG PO TABS
150.0000 ug | ORAL_TABLET | Freq: Every day | ORAL | Status: DC
  Administered 2013-07-19 – 2013-07-24 (×6): 150 ug via ORAL
  Filled 2013-07-19 (×7): qty 1

## 2013-07-19 MED ORDER — NYSTATIN 100000 UNIT/GM EX POWD
Freq: Two times a day (BID) | CUTANEOUS | Status: DC
  Administered 2013-07-19 – 2013-07-24 (×10): via TOPICAL
  Filled 2013-07-19 (×3): qty 15

## 2013-07-19 NOTE — ED Provider Notes (Signed)
CSN: [id number]     Arrival date & time 07/19/13  0243 History   First MD Initiated Contact with Patient 07/19/13 0259     Chief Complaint  Patient presents with  . Emesis  . Diarrhea   (Consider location/radiation/quality/duration/timing/severity/associated sxs/prior Treatment) HPI Comments: 79 y.o. female with history of multiple medical problems including recurrent UTIs, chronic steroid use, temporal arteritis, hypertension who presents with complaints of emesis and diarrhea x 1 day. Pt reports her sx starting earlier on Thursday. She has no associated abd pain. Emesis x few episodes, no bile, blood, and BM have been loose, no blood. No fevers, chills, weakness, chest pain, dib.  Patient is a 79 y.o. female presenting with vomiting and diarrhea. The history is provided by the patient and medical records.  Emesis Associated symptoms: diarrhea   Associated symptoms: no abdominal pain and no headaches   Diarrhea Associated symptoms: vomiting   Associated symptoms: no abdominal pain and no headaches     Past Medical History  Diagnosis Date  . Peripheral neuropathy   . Hypertension   . TIA (transient ischemic attack)   . Elevated sed rate   . Temporal arteritis     chronic steroids  . Hypothyroidism   . Colon cancer     colon  . History of hernia repair   . S/P rotator cuff surgery     right  . Diastolic CHF   . Cardiomegaly - hypertensive   . Chronic anemia   . Chronic steroid use   . Dementia   . Shortness of breath   . Hyperlipidemia   . CHF (congestive heart failure)   . Cardiomyopathy due to hypertension   . Dependence on supplemental oxygen   . Chronic UTI   . Dementia   . Giant cell arteritis    Past Surgical History  Procedure Laterality Date  . Tonsillectomy  1939  . Colectomy  1987  . Abdominal hysterectomy  1974  . Appendectomy  1947  . Hernia repair  1990  . Cholecystectomy  2000  . Rotator cuff repair  2004  . Right temporal artery biopsy  10/1998,  10/2009  . Right cataract extraction  2011  . Eye surgery    . Lumbar puncture  10/07/2004   Family History  Problem Relation Age of Onset  . Dementia Mother   . Heart attack Father    History  Substance Use Topics  . Smoking status: Never Smoker   . Smokeless tobacco: Never Used  . Alcohol Use: No   OB History   Grav Para Term Preterm Abortions TAB SAB Ect Mult Living                 Review of Systems  Constitutional: Negative for activity change.  Respiratory: Negative for shortness of breath.   Cardiovascular: Negative for chest pain.  Gastrointestinal: Positive for nausea, vomiting and diarrhea. Negative for abdominal pain and blood in stool.  Genitourinary: Negative for dysuria, flank pain, vaginal bleeding and vaginal discharge.  Musculoskeletal: Negative for neck pain.  Allergic/Immunologic: Positive for immunocompromised state.  Neurological: Negative for headaches.  Hematological: Does not bruise/bleed easily.  Psychiatric/Behavioral: Negative for confusion.    Allergies  Apap; Ciprofloxacin; Codeine; Demerol; Morphine and related; Nitrofurantoin monohyd macro; Pregabalin; and Septra  Home Medications   Current Outpatient Rx  Name  Route  Sig  Dispense  Refill  . amLODipine (NORVASC) 5 MG tablet   Oral   Take 5 mg by mouth every morning.         Marland Kitchen  BETA CAROTENE PO   Oral   Take 1 capsule by mouth daily.         . cephALEXin (KEFLEX) 250 MG capsule   Oral   Take 250 mg by mouth daily.         . Cranberry 250 MG CAPS   Oral   Take 500 mg by mouth 2 (two) times daily.         Marland Kitchen dipyridamole-aspirin (AGGRENOX) 25-200 MG per 12 hr capsule   Oral   Take 1 capsule by mouth 2 (two) times daily.           Marland Kitchen donepezil (ARICEPT) 10 MG tablet   Oral   Take 10 mg by mouth at bedtime.         . ferrous sulfate 325 (65 FE) MG tablet   Oral   Take 325 mg by mouth daily with breakfast.         . Fiber CAPS   Oral   Take by mouth daily.          . fluconazole (DIFLUCAN) 100 MG tablet   Oral   Take 100 mg by mouth daily. For 6 days. Stop date 07/22/13         . furosemide (LASIX) 40 MG tablet   Oral   Take 1 tablet (40 mg total) by mouth every morning.   30 tablet        Resume on 9/8   . gatifloxacin (ZYMAXID) 0.5 % SOLN   Left Eye   Place 1 drop into the left eye 4 (four) times daily.         . hydrALAZINE (APRESOLINE) 10 MG tablet   Oral   Take 1 tablet (10 mg total) by mouth every 8 (eight) hours.   90 tablet   0   . levothyroxine (SYNTHROID, LEVOTHROID) 150 MCG tablet   Oral   Take 150 mcg by mouth daily before breakfast.         . omeprazole (PRILOSEC) 20 MG capsule   Oral   Take 20 mg by mouth every morning.          . potassium chloride SA (K-DUR,KLOR-CON) 20 MEQ tablet   Oral   Take 20 mEq by mouth 2 (two) times daily.         . predniSONE (DELTASONE) 10 MG tablet   Oral   Take 10 mg by mouth every other day. Alternating with 15mg  dose         . predniSONE (DELTASONE) 5 MG tablet   Oral   Take 15 mg by mouth every other day. Alternating with 10mg  dose         . saccharomyces boulardii (FLORASTOR) 250 MG capsule   Oral   Take 250 mg by mouth 2 (two) times daily.         . sertraline (ZOLOFT) 50 MG tablet   Oral   Take 50 mg by mouth every morning.          Marland Kitchen acetaminophen (TYLENOL) 500 MG tablet   Oral   Take 500 mg by mouth every 6 (six) hours as needed for pain.         Marland Kitchen guaiFENesin (ROBITUSSIN) 100 MG/5ML SOLN   Oral   Take 10 mLs by mouth every 6 (six) hours as needed.          . loperamide (IMODIUM) 2 MG capsule   Oral   Take 2 mg by mouth 4 (four) times daily  as needed for diarrhea or loose stools.          BP 159/56  Pulse 89  Temp(Src) 99 F (37.2 C) (Oral)  Resp 22  SpO2 100% Physical Exam  Nursing note and vitals reviewed. Constitutional: She is oriented to person, place, and time. She appears well-developed and well-nourished.  HENT:   Head: Normocephalic and atraumatic.  Eyes: EOM are normal. Pupils are equal, round, and reactive to light.  Neck: Neck supple.  Cardiovascular: Normal rate, regular rhythm and normal heart sounds.   No murmur heard. Pulmonary/Chest: Effort normal. No respiratory distress.  Abdominal: Soft. She exhibits no distension. There is tenderness. There is no rebound and no guarding.  Mild LLQ tenderness  Neurological: She is alert and oriented to person, place, and time.  Skin: Skin is warm and dry.    ED Course  Procedures (including critical care time) Labs Review Labs Reviewed  CLOSTRIDIUM DIFFICILE BY PCR  CBC WITH DIFFERENTIAL  COMPREHENSIVE METABOLIC PANEL  URINALYSIS, ROUTINE W REFLEX MICROSCOPIC  MAGNESIUM   Imaging Review No results found.  EKG Interpretation   None       MDM  No diagnosis found.  79 y/o on chronic steroids comes in with cc of n/v/diarrhea. She has no abd pain, mild LLQ tenderness on my exam, no CVA tenderness, no UTI like sx.  Vitals show no fevers, no tachycardia, and she doesn't appear to dry. She had recent antibiotic use, and so cdiff is possible etiology for the diarrhea. Based on the abdominal exam, no fevers - no indication for immediate CT, we will observe her slightly longer and decide if it's needed.  Derwood Kaplan, MD 07/19/13 [phone]

## 2013-07-19 NOTE — ED Notes (Signed)
Report called to floor

## 2013-07-19 NOTE — H&P (Addendum)
[facility] History and Physical  [name]:[id number] DOB: October 01, 1929 DOA: 07/19/2013  Referring physician:  Derwood Kaplan PCP:  Cain Saupe, MD   Chief Complaint:  Nausea, vomiting, soft stools  HPI:  The patient is a 79 y.o. year-old female with history of HTN/HLD, PAF not on A/C, chronic diastolic heart failure, temporal arteritis on chronic steroids, recurrent UTI, hx of c. Diff, dementia who presents with nausea, vomiting.  The patient was last at their baseline health a little over a month ago.  She was hospitalized at [facility] with abdominal pain and was found to a fluid collection around the lumbar spine which was sampled.  She was started on empiric antibiotics, however, her culture was negative and she was discharged to follow up with general surgery.  She has been feeling well and living at [facility] where she has been getting around with a rolling walker.  Approximately two days ago, she developed nausea with vomiting which was somewhat bilious and which occurred about 3-4 times per day.  She also had some soft stools which she does not characterize as diarrhea, but which have occurred 2-3 times per day.  She has not been able to eat or drink very well.  Denies abdominal pain, fevers, and chills.  She has had some increased SOB without wheezing, chest tightness, or cough.  She denies dysuria, but has had urgency and frequency of urination.      In ER, T 99.9, HR 80-90s, BP 150s-160s/50s, 98% on 2L [place] (home dose).  WBC 27, creatinine 1.32 (baseline).  UA with small LE, neg nitritie, 0-2 WBC and rare bacteria.  CXR and CT abd/pelvis pending.    Review of Systems:  General:  Denies fevers, chills, weight loss or gain HEENT:  Denies changes to hearing and vision, rhinorrhea, sinus congestion, sore throat CV:  Denies chest pain and palpitations, lower extremity edema.  PULM:  Per HPI GI:  Per HPI GU:  Per HPI ENDO:  Denies polyuria, polydipsia.   HEME:   Denies hematemesis, blood in stools, melena, + abnl bruising. LYMPH:  Denies lymphadenopathy.   MSK:  Denies arthralgias, myalgias.   DERM:  Denies skin rash or ulcer.   NEURO:  Denies focal numbness, weakness, slurred speech, confusion, facial droop.  PSYCH:  Denies anxiety and depression.    Past Medical History  Diagnosis Date  . Peripheral neuropathy   . Hypertension   . TIA (transient ischemic attack)   . Elevated sed rate   . Temporal arteritis     chronic steroids  . Hypothyroidism   . Colon cancer     colon  . History of hernia repair   . S/P rotator cuff surgery     right  . Diastolic CHF   . Cardiomegaly - hypertensive   . Chronic anemia   . Chronic steroid use   . Dementia   . Shortness of breath   . Hyperlipidemia   . CHF (congestive heart failure)   . Cardiomyopathy due to hypertension   . Dependence on supplemental oxygen   . Chronic UTI   . Dementia   . Giant cell arteritis    Past Surgical History  Procedure Laterality Date  . Tonsillectomy  1939  . Colectomy  1987  . Abdominal hysterectomy  1974  . Appendectomy  1947  . Hernia repair  1990  . Cholecystectomy  2000  . Rotator cuff repair  2004  . Right temporal artery biopsy  10/1998, 10/2009  .  Right cataract extraction  2011  . Eye surgery    . Lumbar puncture  10/07/2004   Social History:  reports that she has never smoked. She has never used smokeless tobacco. She reports that she does not drink alcohol or use illicit drugs. [facility], rolling walker and cane  Allergies  Allergen Reactions  . Apap [Acetaminophen]     Reaction unknown  . Ciprofloxacin Nausea And Vomiting  . Codeine Other (See Comments)    halucinations  . Demerol Other (See Comments)    halucinations  . Morphine And Related Other (See Comments)    halucinations  . Nitrofurantoin Monohyd Macro Other (See Comments)    Per MAR  . Pregabalin Other (See Comments)    unknown  . Septra [Bactrim] Other (See Comments)     Unknown cant tolerate DS    Family History  Problem Relation Age of Onset  . Dementia Mother   . Heart attack Father      Prior to Admission medications   Medication Sig Start Date End Date Taking? Authorizing Provider  amLODipine (NORVASC) 5 MG tablet Take 5 mg by mouth every morning.   Yes Historical Provider, MD  BETA CAROTENE PO Take 1 capsule by mouth daily.   Yes Historical Provider, MD  cephALEXin (KEFLEX) 250 MG capsule Take 250 mg by mouth daily.   Yes Historical Provider, MD  Cranberry 250 MG CAPS Take 500 mg by mouth 2 (two) times daily.   Yes Historical Provider, MD  dipyridamole-aspirin (AGGRENOX) 25-200 MG per 12 hr capsule Take 1 capsule by mouth 2 (two) times daily.     Yes Historical Provider, MD  donepezil (ARICEPT) 10 MG tablet Take 10 mg by mouth at bedtime.   Yes Historical Provider, MD  ferrous sulfate 325 (65 FE) MG tablet Take 325 mg by mouth daily with breakfast.   Yes Historical Provider, MD  Fiber CAPS Take by mouth daily.   Yes Historical Provider, MD  fluconazole (DIFLUCAN) 100 MG tablet Take 100 mg by mouth daily. For 6 days. Stop date 07/22/13   Yes Historical Provider, MD  furosemide (LASIX) 40 MG tablet Take 1 tablet (40 mg total) by mouth every morning. 06/03/13  Yes Adeline Joselyn Glassman, MD  gatifloxacin (ZYMAXID) 0.5 % SOLN Place 1 drop into the left eye 4 (four) times daily.   Yes Historical Provider, MD  hydrALAZINE (APRESOLINE) 10 MG tablet Take 1 tablet (10 mg total) by mouth every 8 (eight) hours. 12/09/12  Yes Nishant Dhungel, MD  levothyroxine (SYNTHROID, LEVOTHROID) 150 MCG tablet Take 150 mcg by mouth daily before breakfast.   Yes Historical Provider, MD  omeprazole (PRILOSEC) 20 MG capsule Take 20 mg by mouth every morning.    Yes Historical Provider, MD  potassium chloride SA (K-DUR,KLOR-CON) 20 MEQ tablet Take 20 mEq by mouth 2 (two) times daily.   Yes Historical Provider, MD  predniSONE (DELTASONE) 10 MG tablet Take 10 mg by mouth every other  day. Alternating with 15mg  dose   Yes Historical Provider, MD  predniSONE (DELTASONE) 5 MG tablet Take 15 mg by mouth every other day. Alternating with 10mg  dose   Yes Historical Provider, MD  saccharomyces boulardii (FLORASTOR) 250 MG capsule Take 250 mg by mouth 2 (two) times daily.   Yes Historical Provider, MD  sertraline (ZOLOFT) 50 MG tablet Take 50 mg by mouth every morning.    Yes Historical Provider, MD  acetaminophen (TYLENOL) 500 MG tablet Take 500 mg by mouth every 6 (  six) hours as needed for pain.    Historical Provider, MD  guaiFENesin (ROBITUSSIN) 100 MG/5ML SOLN Take 10 mLs by mouth every 6 (six) hours as needed.     Historical Provider, MD  loperamide (IMODIUM) 2 MG capsule Take 2 mg by mouth 4 (four) times daily as needed for diarrhea or loose stools.    Historical Provider, MD   Physical Exam: Filed Vitals:   07/19/13 0243 07/19/13 0252 07/19/13 0415 07/19/13 0421  BP:  159/56 172/58 169/54  Pulse:  89 89 94  Temp:  99 F (37.2 C) 99.9 F (37.7 C) 99.7 F (37.6 C)  TempSrc:  Oral Oral Rectal  Resp:  22 18 18   SpO2: 98% 100% 99% 98%     General:  CF, no acute distress  Eyes:  Right pupil mildly larger than the left (chronic), anicteric, bilateral injection  ENT:  Nares clear.  OP clear, non-erythematous without plaques or exudates.  Faint white plaques posterior pharynx  Neck:  Supple without TM or JVD.    Lymph:  No cervical, supraclavicular, or submandibular LAD.  Cardiovascular:  RRR, normal S1, S2, without m/r/g.  2+ pulses, warm extremities  Respiratory:  dimininshed bilateral BS without wheeze, rales, or rhonchi, mild tachypnea and SCM/subcostal RTX when moving in bed.    Abdomen:  NABS.  Soft, ND, TTP in the LLQ without rebound or guarding.    Skin:  Erythematous papular rash in inguinal region bilaterally with satellite lesions  Musculoskeletal:  Normal bulk and tone. 1 + LE edema.  Psychiatric:  A & O x 4.  Appropriate affect.  Neurologic:  CN  3-12 intact.  5/5 strength.  Sensation intact.  Labs on Admission:  Basic Metabolic Panel:  Recent Labs Lab 07/19/13 0359  NA 136  K 3.5  CL 97  CO2 27  GLUCOSE 113*  BUN 20  CREATININE 1.32*  CALCIUM 9.4  MG 1.9   Liver Function Tests:  Recent Labs Lab 07/19/13 0359  AST 16  ALT 15  ALKPHOS 80  BILITOT 0.2*  PROT 7.1  ALBUMIN 3.1*   No results found for this basename: LIPASE, AMYLASE,  in the last 168 hours No results found for this basename: AMMONIA,  in the last 168 hours CBC:  Recent Labs Lab 07/19/13 0359  WBC 27.0*  NEUTROABS 24.0*  HGB 12.4  HCT 39.3  MCV 88.1  PLT 238   Cardiac Enzymes: No results found for this basename: CKTOTAL, CKMB, CKMBINDEX, TROPONINI,  in the last 168 hours  BNP (last 3 results)  Recent Labs  10/08/12 1723 10/12/12 0530 01/23/13 1640  PROBNP 1356.0* 8213.0* 1346.0*   CBG: No results found for this basename: GLUCAP,  in the last 168 hours  Radiological Exams on Admission: No results found.  ECG:  pending  Assessment/Plan Principal Problem:   Abdominal pain, acute, left lower quadrant Active Problems:   Generalized weakness   Hypothyroidism   Hyperlipidemia   Depression   Dementia   Chronic steroid use   Chronic anemia   Diastolic CHF   Temporal arteritis   Hypertension   Leukocytosis   Nausea and vomiting   CKD (chronic kidney disease) stage 3, GFR 30-59 ml/min  ---  Nausea, vomiting, and soft stools with acute LLQ pain on exam:  DDx includes gastroenteritis, ischemic colitis or other infectious colitis such as C. Diff, diverticulitis.  She is at increased risk of infection due to her chronic disease and steroid use and the location of her abdominal  pain suggests diverticulitis.  She has had recent exposure to abx. -  Zofran prn (f/u ECG for QTc) -  Gentle hydration -  CT ab/pelvis pending -  C. Diff -  Stool culture -  Blood culture -  Strict I/O -  Ceftriaxone/flagyl  Leukocytosis, likely  related to acute infection -  UA neg -  CXR and CT abd pending -  Empiric abx for diverticulitis as above  HTN/HLD:  Blood pressure mildly elevated in setting of acute illness -  Trend BP -  Continue home BP medicationsn  Diastolic heart failure with mild SOB -  F/u CXR -  Judicious IVF  Hx TIA, stable.  Continue aggrenox  Temporal arteritis on chronic steroids -  Continue steroids at current dose as hemodynamically stable, but could consider increasing 3x3 if worsening illness  CKD stage 3, stable.  Minimize nephrotoxins and renally dose medications.  Dementia, mild.  Frequent reorientation prn and continue donepezil  Hypothyroidism, stable.  Continue synthroid  GERD, stable.  Continue PPI  Depression, stable.  Continue sertraline  Iron deficiency anemia with normal hgb.  Cont iron  Diet:  CLD Access:  PIV IVF:  D5 1/2 + KCl Proph:  lovenox  Code Status: full Family Communication: patient alone Disposition Plan: Admit to med surg  Time spent: 60 min Renae Fickle [facility] Pager [phone]  If 7PM-7AM, please contact night-coverage [email or web address] Password [email or web address] 07/19/2013, 8:02 AM

## 2013-07-19 NOTE — ED Notes (Signed)
Report received by floor.  Patient transported.

## 2013-07-19 NOTE — ED Notes (Signed)
Nurse unavailable to take report.  She will call back.

## 2013-07-19 NOTE — ED Notes (Signed)
Per EMS called out to the [facility] for pt having N/V/D for a day without any improvement  IV was started and pt was given 250 ml of NS and Zofran 8mg  was given IV  Pts skin color improved but pt continues to c/o nausea

## 2013-07-19 NOTE — ED Notes (Signed)
Pt of N/V x1day. Pt transported form [facility]

## 2013-07-19 NOTE — Care Management Note (Signed)
   CARE MANAGEMENT NOTE 07/19/2013  Patient:  [name], [name]   Account Number:  [id number]  Date Initiated:  07/19/2013  Documentation initiated by:  Semaj Kham  Subjective/Objective Assessment:   79 yo female admitted with ABD pain, N/V, and frequent soft stools.     Action/Plan:   [facility]   Anticipated DC Date:     Anticipated DC Plan:  SKILLED NURSING FACILITY  In-house referral  Clinical Social Worker      DC Associate Professor  CM consult      [facility] Choice  NA   Choice offered to / List presented to:  NA   DME arranged  NA      DME agency  NA     HH arranged  NA      HH agency  NA   Status of service:  Completed, signed off Medicare Important Message given?   (If response is "NO", the following Medicare IM given date fields will be blank) Date Medicare IM given:   Date Additional Medicare IM given:    Discharge Disposition:    Per UR Regulation:  Reviewed for med. necessity/level of care/duration of stay  If discussed at Long Length of Stay Meetings, dates discussed:    Comments:  07/19/13 1258 Deshawn Skelley,RN,MSN [phone] Chart reviewed for utilization of services. Pt from [facility]. CSW following.

## 2013-07-19 NOTE — Procedures (Signed)
Technically successful US guided aspiration of indeterminate fluid collection within the SQ tissues of the left lower back yielding 20 cc of serous fluid. Aspirated sample sent to lab. No immediate post procedural complications.

## 2013-07-19 NOTE — Consult Note (Signed)
[facility]  Total days of antibiotics 1        Day 1 ceftriaxone        Day 1 metronidazole               Reason for Consult: para spinal abscess /diverticulitis    Referring Physician: short  Principal Problem:   Abdominal pain, acute, left lower quadrant Active Problems:   Generalized weakness   Hypothyroidism   Hyperlipidemia   Depression   Dementia   Chronic steroid use   Chronic anemia   Diastolic CHF   Temporal arteritis   Hypertension   Leukocytosis   Nausea and vomiting   CKD (chronic kidney disease) stage 3, GFR 30-59 ml/min    HPI: [name] is a 79 y.o. female  with history of HTN/HLD, PAF, chronic diastolic heart failure, temporal arteritis on chronic steroids, recurrent UTI, hx of C. Diff, dementia who presents with nausea, vomiting and left lower quadrant abdominal pain. She was admitted to [facility] in early September with abdominal pain and was found to a fluid collection around the lumbar spine which was sampled but cultures were negative.she was discharged on 2 wks of ceftin plus doxycycline with follow up with general surgery. Two days prior to admit, she developed nausea with vomiting which was somewhat bilious and which occurred about 3-4 times per day and accompanied by 2-3 bouts of loose stools. She has not been able to eat or drink very well. Denies abdominal pain, fevers, and chills. She has had some increased SOB without wheezing, chest tightness, or cough. She denies dysuria, but has had urgency and frequency of urination.   In ER, she is afebrile and hemodynamically stable but lab work did show impressive leukocytosis of WBC 27 with left shit, creatinine 1.32 (baseline). UA with small LE, neg nitritie, 0-2 WBC and rare bacteria.CT abd/pelvis showed ongoing fluid collection in soft tissue by L4 measuered 5.3 x 5.7 x 3.3 with some stranding. No comment on bowel wall inflammation.   Patient reports feeling cold, sleepy  from procedure of having IR sampling   Past Medical History  Diagnosis Date  . Peripheral neuropathy   . Hypertension   . TIA (transient ischemic attack)   . Elevated sed rate   . Temporal arteritis     chronic steroids  . Hypothyroidism   . Colon cancer     colon  . History of hernia repair   . S/P rotator cuff surgery     right  . Diastolic CHF   . Cardiomegaly - hypertensive   . Chronic anemia   . Chronic steroid use   . Dementia   . Shortness of breath   . Hyperlipidemia   . CHF (congestive heart failure)   . Cardiomyopathy due to hypertension   . Dependence on supplemental oxygen   . Chronic UTI   . Dementia   . Giant cell arteritis     Allergies:  Allergies  Allergen Reactions  . Apap [Acetaminophen]     Reaction unknown  . Ceftriaxone Itching  . Ciprofloxacin Nausea And Vomiting  . Codeine Other (See Comments)    halucinations  . Demerol Other (See Comments)    halucinations  . Metronidazole Itching  . Morphine And Related Other (See Comments)    halucinations  . Nitrofurantoin Monohyd Macro Other (See Comments)    Per MAR  . Pregabalin Other (See Comments)    unknown  . Septra [Bactrim] Other (  See Comments)    Unknown cant tolerate DS   MEDICATIONS: . amLODipine  5 mg Oral q morning - 10a  . dipyridamole-aspirin  1 capsule Oral BID  . donepezil  10 mg Oral QHS  . enoxaparin (LOVENOX) injection  40 mg Subcutaneous Q24H  . ferrous sulfate  325 mg Oral Q breakfast  . fluconazole  100 mg Oral Daily  . furosemide  40 mg Oral q morning - 10a  . hydrALAZINE  10 mg Oral Q8H  . levothyroxine  150 mcg Oral QAC breakfast  . nystatin   Topical BID  . pantoprazole  40 mg Oral Daily  . predniSONE  10 mg Oral QODAY  . [START ON 07/20/2013] predniSONE  15 mg Oral QODAY  . saccharomyces boulardii  250 mg Oral BID  . sertraline  50 mg Oral q morning - 10a    History  Substance Use Topics  . Smoking status: Never Smoker   . Smokeless tobacco: Never Used    . Alcohol Use: No    Family History  Problem Relation Age of Onset  . Dementia Mother   . Heart attack Father     Review of Systems  Constitutional: Negative for fever, chills, diaphoresis, activity change, appetite change, fatigue and unexpected weight change.  HENT: Negative for congestion, sore throat, rhinorrhea, sneezing, trouble swallowing and sinus pressure.  Eyes: Negative for photophobia and visual disturbance.  Respiratory: Negative for cough, chest tightness, shortness of breath, wheezing and stridor.  Cardiovascular: Negative for chest pain, palpitations and leg swelling.  Gastrointestinal: Negative for nausea, vomiting, abdominal pain, diarrhea, constipation, blood in stool, abdominal distention and anal bleeding.  Genitourinary: Negative for dysuria, hematuria, flank pain and difficulty urinating.  Musculoskeletal: Negative for myalgias, back pain, joint swelling, arthralgias and gait problem.  Skin: Negative for color change, pallor, rash and wound.  Neurological: Negative for dizziness, tremors, weakness and light-headedness.  Hematological: Negative for adenopathy. Does not bruise/bleed easily.  Psychiatric/Behavioral: Negative for behavioral problems, confusion, sleep disturbance, dysphoric mood, decreased concentration and agitation.     OBJECTIVE: Temp:  [98.4 F (36.9 C)-99.9 F (37.7 C)] 98.4 F (36.9 C) (10/24 1300) Pulse Rate:  [79-94] 80 (10/24 1300) Resp:  [18-22] 18 (10/24 1300) BP: (142-172)/(52-58) 142/56 mmHg (10/24 1300) SpO2:  [95 %-100 %] 100 % (10/24 1300) Weight:  [206 lb 9.1 oz (93.7 kg)] 206 lb 9.1 oz (93.7 kg) (10/24 1300)  Constitutional:oriented to person, place, and time. appears well-developed and well-nourished. Chronically ill appearing. Non toxic HENT:  Mouth/Throat: Oropharynx is clear and moist. No oropharyngeal exudate.  Cardiovascular: Normal rate, regular rhythm and normal heart sounds. Exam reveals no gallop and no friction  rub.  No murmur heard.  Pulmonary/Chest: Effort normal and breath sounds normal. No respiratory distress.  has no wheezes.  Abdominal: Soft. Bowel sounds are normal.exhibits no distension. TTP in LLQ no rebound Lymphadenopathy:  no cervical adenopathy.  Neurological:is alert and oriented to person, place, and time.  Skin: Skin is warm and dry. No rash noted. No erythema.  Psychiatric: has a normal mood and affect.  behavior is normal.    LABS: Results for orders placed during the hospital encounter of 07/19/13 (from the past 48 hour(s))  CBC WITH DIFFERENTIAL     Status: Abnormal   Collection Time    07/19/13  3:59 AM      Result Value Range   WBC 27.0 (*) 4.0 - 10.5 K/uL   RBC 4.46  3.87 - 5.11 MIL/uL  Hemoglobin 12.4  12.0 - 15.0 g/dL   HCT 16.1  09.6 - 04.5 %   MCV 88.1  78.0 - 100.0 fL   MCH 27.8  26.0 - 34.0 pg   MCHC 31.6  30.0 - 36.0 g/dL   RDW 40.9 (*) 81.1 - 91.4 %   Platelets 238  150 - 400 K/uL   Neutrophils Relative % 89 (*) 43 - 77 %   Lymphocytes Relative 6 (*) 12 - 46 %   Monocytes Relative 5  3 - 12 %   Eosinophils Relative 0  0 - 5 %   Basophils Relative 0  0 - 1 %   Neutro Abs 24.0 (*) 1.7 - 7.7 K/uL   Lymphs Abs 1.6  0.7 - 4.0 K/uL   Monocytes Absolute 1.4 (*) 0.1 - 1.0 K/uL   Eosinophils Absolute 0.0  0.0 - 0.7 K/uL   Basophils Absolute 0.0  0.0 - 0.1 K/uL   WBC Morphology VACUOLATED NEUTROPHILS    COMPREHENSIVE METABOLIC PANEL     Status: Abnormal   Collection Time    07/19/13  3:59 AM      Result Value Range   Sodium 136  135 - 145 mEq/L   Potassium 3.5  3.5 - 5.1 mEq/L   Chloride 97  96 - 112 mEq/L   CO2 27  19 - 32 mEq/L   Glucose, Bld 113 (*) 70 - 99 mg/dL   BUN 20  6 - 23 mg/dL   Creatinine, Ser 7.82 (*) 0.50 - 1.10 mg/dL   Calcium 9.4  8.4 - 95.6 mg/dL   Total Protein 7.1  6.0 - 8.3 g/dL   Albumin 3.1 (*) 3.5 - 5.2 g/dL   AST 16  0 - 37 U/L   ALT 15  0 - 35 U/L   Alkaline Phosphatase 80  39 - 117 U/L   Total Bilirubin 0.2 (*) 0.3 - 1.2  mg/dL   GFR calc non Af Amer 37 (*) >90 mL/min   GFR calc Af Amer 43 (*) >90 mL/min   Comment: (NOTE)     The eGFR has been calculated using the CKD EPI equation.     This calculation has not been validated in all clinical situations.     eGFR's persistently <90 mL/min signify possible Chronic Kidney     Disease.  MAGNESIUM     Status: None   Collection Time    07/19/13  3:59 AM      Result Value Range   Magnesium 1.9  1.5 - 2.5 mg/dL  URINALYSIS, ROUTINE W REFLEX MICROSCOPIC     Status: Abnormal   Collection Time    07/19/13  6:51 AM      Result Value Range   Color, Urine YELLOW  YELLOW   APPearance CLEAR  CLEAR   Specific Gravity, Urine 1.024  1.005 - 1.030   pH 5.5  5.0 - 8.0   Glucose, UA NEGATIVE  NEGATIVE mg/dL   Hgb urine dipstick NEGATIVE  NEGATIVE   Bilirubin Urine NEGATIVE  NEGATIVE   Ketones, ur NEGATIVE  NEGATIVE mg/dL   Protein, ur NEGATIVE  NEGATIVE mg/dL   Urobilinogen, UA 0.2  0.0 - 1.0 mg/dL   Nitrite NEGATIVE  NEGATIVE   Leukocytes, UA SMALL (*) NEGATIVE  URINE MICROSCOPIC-ADD ON     Status: None   Collection Time    07/19/13  6:51 AM      Result Value Range   Squamous Epithelial / LPF RARE  RARE  WBC, UA 0-2  <3 WBC/hpf   Bacteria, UA RARE  RARE   Urine-Other MUCOUS PRESENT      MICRO: 10/24 blood cx PENDING IMAGING: Ct Abdomen Pelvis Wo Contrast  07/19/2013   CLINICAL DATA:  Lower abdominal pain with nausea and vomiting  EXAM: CT ABDOMEN AND PELVIS WITHOUT CONTRAST  TECHNIQUE: Multidetector CT imaging of the abdomen and pelvis was performed following the standard protocol without oral or intravenous contrast.  COMPARISON:  May 30, 2013  FINDINGS: There is scarring in the lung bases bilaterally. There is mild lower lobe bronchiectatic change. Heart is mildly enlarged with scattered foci of coronary artery calcification.  The no focal liver lesions are identified on this non contrast enhanced study. Gallbladder is absent. There is no appreciable  biliary duct dilatation.  Spleen appears normal. There is some fatty replacement in the pancreas. There is no pancreatic mass or inflammatory focus. Adrenals appear normal.  Kidneys bilaterally show no appreciable mass, calculus, or hydronephrosis on either side. There is no ureteral calculus or ureterectasis on either side.  There is again noted fluid in the soft tissues posterior to the level of L4 on the left. This fluid collection is smaller than on the previous study. It currently measures 5.3 x 5.7 x 3.3 cm. There is some mild stranding in the fat adjacent to this fluid collection the.  In the pelvis, there are several small urinary bladder diverticula. There is no pelvic mass or fluid collection. The uterus and appendix are absent.  There is no bowel obstruction. No free air or portal venous air.  There is a small ventral hernia which contains bowel but no apparent bowel compromise.  There is no ascites, adenopathy, or abscess in the abdomen or pelvis. There is no demonstrable diverticulitis. The periappendiceal region appears unremarkable.  There is atherosclerotic change in the aorta but no aneurysm. There is degenerative change in the lumbar spine. There are no blastic or lytic bone lesions.  IMPRESSION: Fluid collection posterior to L4 in the posterior soft tissues of the back is again noted although smaller. There is surrounding stranding in this area. Inflammatory etiology is suspected.  There are multiple urinary bladder diverticula without apparent inflammation.  The gallbladder, uterus, and appendix are absent.  No bowel obstruction. No diverticulitis. No abscess.   Electronically Signed   By: Bretta Bang M.D.   On: 07/19/2013 09:20   Dg Chest 2 View  07/19/2013   CLINICAL DATA:  leukocytosis and nausea  EXAM: CHEST  2 VIEW  COMPARISON:  January 23, 2013  FINDINGS: There is no edema or consolidation. Heart is mildly enlarged with normal pulmonary vascularity. No adenopathy. There is  atherosclerotic change in the aorta.  IMPRESSION: The mild cardiac enlargement. No edema or consolidation.   Electronically Signed   By: Bretta Bang M.D.   On: 07/19/2013 09:13    Assessment/Plan:  79 y.o. female  with history of HTN/HLD, PAF, chronic diastolic heart failure, temporal arteritis on chronic steroids, recurrent UTI, hx of C. Diff, dementia who presents with nausea, vomiting and left lower quadrant abdominal pain found to have ongoing fluid collection +/- bowel inflammation  - recommend to get IR drainage of posterior fluid collection, sending fluid for cell count with diff, gram stain and culture - continue with ceftriaxone and metronidazole for presumed enteric infection. Also add vancomycin for possible MRSA from soft tissue abscess - will follow up on blood and aspirate cultures.  Duke Salvia Drue Second MD MPH [facility] [phone]

## 2013-07-19 NOTE — ED Notes (Signed)
MD at bedside. 

## 2013-07-19 NOTE — Progress Notes (Addendum)
ANTIBIOTIC CONSULT NOTE - INITIAL  Pharmacy Consult for Vancomycin Indication: ? MRSA--Soft tissue Abscess  Allergies  Allergen Reactions  . Apap [Acetaminophen]     Reaction unknown  . Ceftriaxone Itching  . Ciprofloxacin Nausea And Vomiting  . Codeine Other (See Comments)    halucinations  . Demerol Other (See Comments)    halucinations  . Metronidazole Itching  . Morphine And Related Other (See Comments)    halucinations  . Nitrofurantoin Monohyd Macro Other (See Comments)    Per MAR  . Pregabalin Other (See Comments)    unknown  . Septra [Bactrim] Other (See Comments)    Unknown cant tolerate DS    Patient Measurements: Height: 5' 10.5" (179.1 cm) Weight: 206 lb 9.1 oz (93.7 kg) IBW/kg (Calculated) : 69.65 Adjusted Body Weight:   Vital Signs: Temp: 98.4 F (36.9 C) (10/24 1300) Temp src: Oral (10/24 1300) BP: 142/56 mmHg (10/24 1300) Pulse Rate: 80 (10/24 1300) Intake/Output from previous day:   Intake/Output from this shift:    Labs:  Recent Labs  07/19/13 0359  WBC 27.0*  HGB 12.4  PLT 238  CREATININE 1.32*   Estimated Creatinine Clearance: 41.8 ml/min (by C-G formula based on Cr of 1.32). No results found for this basename: VANCOTROUGH, VANCOPEAK, VANCORANDOM, GENTTROUGH, GENTPEAK, GENTRANDOM, TOBRATROUGH, TOBRAPEAK, TOBRARND, AMIKACINPEAK, AMIKACINTROU, AMIKACIN,  in the last 72 hours   Microbiology: No results found for this or any previous visit (from the past 720 hour(s)).  Medical History: Past Medical History  Diagnosis Date  . Peripheral neuropathy   . Hypertension   . TIA (transient ischemic attack)   . Elevated sed rate   . Temporal arteritis     chronic steroids  . Hypothyroidism   . Colon cancer     colon  . History of hernia repair   . S/P rotator cuff surgery     right  . Diastolic CHF   . Cardiomegaly - hypertensive   . Chronic anemia   . Chronic steroid use   . Dementia   . Shortness of breath   . Hyperlipidemia    . CHF (congestive heart failure)   . Cardiomyopathy due to hypertension   . Dependence on supplemental oxygen   . Chronic UTI   . Dementia   . Giant cell arteritis     Medications:  Scheduled:  . amLODipine  5 mg Oral q morning - 10a  . dipyridamole-aspirin  1 capsule Oral BID  . donepezil  10 mg Oral QHS  . enoxaparin (LOVENOX) injection  40 mg Subcutaneous Q24H  . ferrous sulfate  325 mg Oral Q breakfast  . fluconazole  100 mg Oral Daily  . furosemide  40 mg Oral q morning - 10a  . hydrALAZINE  10 mg Oral Q8H  . levothyroxine  150 mcg Oral QAC breakfast  . nystatin   Topical BID  . pantoprazole  40 mg Oral Daily  . predniSONE  10 mg Oral QODAY  . [START ON 07/20/2013] predniSONE  15 mg Oral QODAY  . saccharomyces boulardii  250 mg Oral BID  . sertraline  50 mg Oral q morning - 10a   Infusions:  . dextrose 5 % and 0.45 % NaCl with KCl 10 mEq/L     PRN: acetaminophen, diphenhydrAMINE, ondansetron (ZOFRAN) IV, ondansetron Assessment: 79 yo F admitted for treatment of soft tissue infection. Possibly MRSA. Had aspiration of 20 ml of serous fluid from lower back. Patient is afebrile but with WBC=27k with left shift. PMH-HTN/HLD,  PAF, hx of C. Diff, recurrent UTI, chronic diastolic HF, dementia.  Goal of Therapy:  Vancomycin trough level 15-20 mcg/ml  Plan:    Begin Vancomycin 750mg  IV q 12 hours.  Measure antibiotic drug levels at steady state  Follow up culture results--blood cultures and aspirate  Loletta Specter 07/19/2013,4:42 PM

## 2013-07-20 LAB — COMPREHENSIVE METABOLIC PANEL
BUN: 12 mg/dL (ref 6–23)
CO2: 25 mEq/L (ref 19–32)
Calcium: 8.9 mg/dL (ref 8.4–10.5)
Chloride: 101 mEq/L (ref 96–112)
Creatinine, Ser: 1.15 mg/dL — ABNORMAL HIGH (ref 0.50–1.10)
GFR calc Af Amer: 50 mL/min — ABNORMAL LOW (ref >90)
GFR calc non Af Amer: 43 mL/min — ABNORMAL LOW (ref >90)
Glucose, Bld: 100 mg/dL — ABNORMAL HIGH (ref 70–99)

## 2013-07-20 LAB — CBC
Hemoglobin: 11.8 g/dL — ABNORMAL LOW (ref 12.0–15.0)
RBC: 4.21 MIL/uL (ref 3.87–5.11)
WBC: 8.3 10*3/uL (ref 4.0–10.5)

## 2013-07-20 LAB — CLOSTRIDIUM DIFFICILE BY PCR: Toxigenic C. Difficile by PCR: POSITIVE — AB

## 2013-07-20 LAB — MRSA PCR SCREENING: MRSA by PCR: NEGATIVE

## 2013-07-20 MED ORDER — PIPERACILLIN-TAZOBACTAM 3.375 G IVPB
3.3750 g | Freq: Three times a day (TID) | INTRAVENOUS | Status: DC
  Administered 2013-07-20 – 2013-07-21 (×4): 3.375 g via INTRAVENOUS
  Filled 2013-07-20 (×5): qty 50

## 2013-07-20 MED ORDER — VANCOMYCIN 50 MG/ML ORAL SOLUTION
125.0000 mg | Freq: Four times a day (QID) | ORAL | Status: DC
  Administered 2013-07-20 – 2013-07-21 (×4): 125 mg via ORAL
  Filled 2013-07-20 (×7): qty 2.5

## 2013-07-20 NOTE — Progress Notes (Addendum)
[facility] PROGRESS NOTE  DAJANIQUE ROBLEY ZOX:[id number] DOB: 26-Mar-1930 DOA: 07/19/2013 PCP: Cain Saupe, MD  Brief narrative: [name] is an 79 y.o. female with a PMH of hypertension, hyperlipidemia, paroxysmal atrial fibrillation (not on anticoagulation), chronic diastolic CHF, temporal arteritis on chronic steroids, recurrent UTI, history of C. Difficile, recent hospitalization 05/30/13-06/01/13 for treatment of paraspinal fluid collection (culture negative) and dementia who was admitted on 07/19/2013 with nausea and vomiting. CT scan of the abdomen and pelvis showed fluid collection posterior to L4 in the posterior soft tissues of the back (although smaller) with surrounding stranding in this area. Inflammatory etiology suspected.  Underwent percutaneous drainage on 07/19/2013.  Assessment/Plan: Principal Problem:   Abdominal pain, acute, left lower quadrant with recurrent fluid collection posterior to L4 -Previously hospitalized 05/30/13-06/01/2013. Underwent aspiration of fluid collection per IR with cultures of aspirated negative to date. ID recommended a two-week course of Ceftin/doxycycline. -Underwent repeat percutaneous drainage per IR on 07/19/13. -Continue supportive care with Zofran, gentle hydration. -Followup blood cultures, stool cultures, C. difficile PCR. -Seen by ID 07/19/13. -Continue empiric vancomycin. Add Zosyn. Active Problems:   C. Difficile Diarrhea -Add oral Vancomycin given high WBC.   Generalized weakness -PT/OT evaluations requested.   Hypothyroidism -Continue Synthroid.   Depression -Continue Zoloft.   Dementia -Continue Aricept.   Chronic anemia -Hemoglobin stable at 12.4 mg/dL. Continue iron therapy.   Chronic Diastolic CHF -Monitor I and O balance closely. Continue Lasix. Chest x-ray negative for pulmonary edema.   Temporal arteritis / Chronic steroid use -Continue prednisone.   Hypertension -Continue Norvasc, hydralazine and Lasix.    Leukocytosis -UA neg.  -CXR negative for consolidation, CT abd showed recurrent fluid collection which was drained. -Followup cultures.  -Continue empiric vancomycin. Add Zosyn.    Nausea and vomiting -Continue Zofran as needed.   CKD (chronic kidney disease) stage 3, GFR 30-59 ml/min -Creatinine stable at usual baseline.   History of TIA -Continue Aggrenox.   GERD -Continue Protonix.  Code Status: DNR Family Communication: No family currently at the bedside. Disposition Plan: From [facility]   Medical Consultants:  Dr. Simonne Come, Interventional radiology  Other Consultants:  Physical therapy  Occupational therapy  Anti-infectives:  Vancomycin 07/19/2013--->  Diflucan 07/19/2013---> 07/23/2013  Rocephin 07/19/2013---> 07/19/2013  Flagyl 07/19/2013---> 07/19/2013  Zosyn 07/20/2013--->  Oral Vancomycin 07/20/13--->   HPI/Subjective: [name] cannot tell me she's had any loose stools. Nursing staff tells me that she has not had any further nausea, vomiting and no further loose stools since last evening. Denies dyspnea and pain.  Objective: Filed Vitals:   07/19/13 1300 07/19/13 2219 07/20/13 0257 07/20/13 0601  BP: 142/56 138/53  151/55  Pulse: 80 75  73  Temp: 98.4 F (36.9 C) 97.8 F (36.6 C)  99 F (37.2 C)  TempSrc: Oral Oral  Oral  Resp: 18 18  18   Height: 5' 10.5" (1.791 m)     Weight: 93.7 kg (206 lb 9.1 oz)  93.2 kg (205 lb 7.5 oz)   SpO2: 100% 99%  97%    Intake/Output Summary (Last 24 hours) at 07/20/13 0735 Last data filed at 07/20/13 8119  Gross per 24 hour  Intake 700.83 ml  Output      0 ml  Net 700.83 ml    Exam: Gen:  NAD Cardiovascular:  RRR, No M/R/G Respiratory:  Lungs CTAB Gastrointestinal:  Abdomen soft, NT/ND, + BS Extremities:  No C/E/C  Data Reviewed: Basic Metabolic Panel:  Recent Labs Lab 07/19/13  0359  NA 136  K 3.5  CL 97  CO2 27  GLUCOSE 113*  BUN 20  CREATININE 1.32*  CALCIUM 9.4   MG 1.9   GFR Estimated Creatinine Clearance: 41.7 ml/min (by C-G formula based on Cr of 1.32). Liver Function Tests:  Recent Labs Lab 07/19/13 0359  AST 16  ALT 15  ALKPHOS 80  BILITOT 0.2*  PROT 7.1  ALBUMIN 3.1*   CBC:  Recent Labs Lab 07/19/13 0359  WBC 27.0*  NEUTROABS 24.0*  HGB 12.4  HCT 39.3  MCV 88.1  PLT 238   BNP (last 3 results)  Recent Labs  10/08/12 1723 10/12/12 0530 01/23/13 1640  PROBNP 1356.0* 8213.0* 1346.0*   Microbiology Recent Results (from the past 240 hour(s))  MRSA PCR SCREENING     Status: None   Collection Time    07/20/13  1:30 AM      Result Value Range Status   MRSA by PCR NEGATIVE  NEGATIVE Final   Comment:            The GeneXpert MRSA Assay (FDA     approved for NASAL specimens     only), is one component of a     comprehensive MRSA colonization     surveillance program. It is not     intended to diagnose MRSA     infection nor to guide or     monitor treatment for     MRSA infections.     Procedures and Diagnostic Studies: Ct Abdomen Pelvis Wo Contrast  07/19/2013   CLINICAL DATA:  Lower abdominal pain with nausea and vomiting  EXAM: CT ABDOMEN AND PELVIS WITHOUT CONTRAST  TECHNIQUE: Multidetector CT imaging of the abdomen and pelvis was performed following the standard protocol without oral or intravenous contrast.  COMPARISON:  May 30, 2013  FINDINGS: There is scarring in the lung bases bilaterally. There is mild lower lobe bronchiectatic change. Heart is mildly enlarged with scattered foci of coronary artery calcification.  The no focal liver lesions are identified on this non contrast enhanced study. Gallbladder is absent. There is no appreciable biliary duct dilatation.  Spleen appears normal. There is some fatty replacement in the pancreas. There is no pancreatic mass or inflammatory focus. Adrenals appear normal.  Kidneys bilaterally show no appreciable mass, calculus, or hydronephrosis on either side. There is  no ureteral calculus or ureterectasis on either side.  There is again noted fluid in the soft tissues posterior to the level of L4 on the left. This fluid collection is smaller than on the previous study. It currently measures 5.3 x 5.7 x 3.3 cm. There is some mild stranding in the fat adjacent to this fluid collection the.  In the pelvis, there are several small urinary bladder diverticula. There is no pelvic mass or fluid collection. The uterus and appendix are absent.  There is no bowel obstruction. No free air or portal venous air.  There is a small ventral hernia which contains bowel but no apparent bowel compromise.  There is no ascites, adenopathy, or abscess in the abdomen or pelvis. There is no demonstrable diverticulitis. The periappendiceal region appears unremarkable.  There is atherosclerotic change in the aorta but no aneurysm. There is degenerative change in the lumbar spine. There are no blastic or lytic bone lesions.  IMPRESSION: Fluid collection posterior to L4 in the posterior soft tissues of the back is again noted although smaller. There is surrounding stranding in this area. Inflammatory etiology is suspected.  There are multiple urinary bladder diverticula without apparent inflammation.  The gallbladder, uterus, and appendix are absent.  No bowel obstruction. No diverticulitis. No abscess.   Electronically Signed   By: Bretta Bang M.D.   On: 07/19/2013 09:20   Dg Chest 2 View  07/19/2013   CLINICAL DATA:  leukocytosis and nausea  EXAM: CHEST  2 VIEW  COMPARISON:  January 23, 2013  FINDINGS: There is no edema or consolidation. Heart is mildly enlarged with normal pulmonary vascularity. No adenopathy. There is atherosclerotic change in the aorta.  IMPRESSION: The mild cardiac enlargement. No edema or consolidation.   Electronically Signed   By: Bretta Bang M.D.   On: 07/19/2013 09:13   US Aspiration  07/19/2013   INDICATION: Indeterminate recurrent fluid collection within the  subcutaneous tissues of the left lower back.  EXAM: ULTRASOUND GUIDED SOFT TISSUE FLUID COLLECTION ASPIRATION  MEDICATIONS: None  TECHNIQUE: Informed written consent was obtained from the patient after a discussion of the risks, benefits and alternatives to treatment. Questions regarding the procedure were encouraged and answered. A timeout was performed prior to the initiation of the procedure.  The patient was positioned right lateral decubitus in her bed (patient is unable to lie prone). Preprocedural ultrasound scanning in demonstrates a small easily compressible fluid collection within the left side of the midline of the lower back which correlates with the recurrent fluid collection seen on preceding abdominal CT.  The left lower back was prepped and draped in the usual sterile fashion, and a sterile drape was applied covering the operative field. Maximum barrier sterile technique with sterile gowns and gloves were used for the procedure. A timeout was performed prior to the initiation of the procedure.  Under direct ultrasound guidance, a 5-French multi side-hole Yueh sheath needle was utilized to access the fluid collection after the overlying soft tissues was anesthetized with 1% lidocaine with epinephrine. A total of approximately 20 ml of clear, straw-colored fluid was aspirated.  Postprocedural ultrasound imaging demonstrated near complete resolution of the fluid collection. The patient tolerated the procedure well without immediate postprocedural complication.  ANESTHESIA/SEDATION: None  COMPARISON:  Ultrasound-guided fluid collection -05/30/2013 ; CT abdomen and pelvis - earlier same day  COMPLICATIONS: None immediate  FINDINGS: Ultrasound scanning demonstrates and easily compressible anechoic fluid collection within the midline of the left lower back which correlates with the findings on preprocedural abdominal CT. Under direct ultrasound guidance, approximately 20 cc of serous fluid was aspirated.  The aspirated samples was sent to the laboratory for analysis.  IMPRESSION: Technically successful aspiration of indeterminate subcutaneous fluid collection within in the midline of the left lower back yielding approximately 20 cc of serous fluid. The aspirated sample was sent to the laboratory for analysis.   Electronically Signed   By: Simonne Come M.D.   On: 07/19/2013 17:43    Scheduled Meds: . amLODipine  5 mg Oral q morning - 10a  . dipyridamole-aspirin  1 capsule Oral BID  . donepezil  10 mg Oral QHS  . enoxaparin (LOVENOX) injection  40 mg Subcutaneous Q24H  . ferrous sulfate  325 mg Oral Q breakfast  . fluconazole  100 mg Oral Daily  . furosemide  40 mg Oral q morning - 10a  . hydrALAZINE  10 mg Oral Q8H  . levothyroxine  150 mcg Oral QAC breakfast  . nystatin   Topical BID  . pantoprazole  40 mg Oral Daily  . predniSONE  10 mg Oral QODAY  . predniSONE  15 mg Oral QODAY  . saccharomyces boulardii  250 mg Oral BID  . sertraline  50 mg Oral q morning - 10a  . vancomycin  750 mg Intravenous Q12H   Continuous Infusions: . dextrose 5 % and 0.45 % NaCl with KCl 10 mEq/L 1,000 mL (07/19/13 1652)    Time spent: 35 minutes with > 50% of time discussing current diagnostic test results, clinical impression and plan of care.    LOS: 1 day   RAMA,CHRISTINA  [facility] Pager [phone].   *Please note that the hospitalists switch teams on Wednesdays. Please call the flow manager at [phone] if you are having difficulty reaching the hospitalist taking care of this patient as she can update you and provide the most up-to-date pager number of provider caring for the patient. If 8PM-8AM, please contact night-coverage at [email or web address], password [facility]  07/20/2013, 7:35 AM

## 2013-07-20 NOTE — Progress Notes (Signed)
ANTIBIOTIC CONSULT NOTE - INITIAL  Pharmacy Consult for Vancomycin + Zosyn Indication: Paraspinal abscess, r/o MRSA  Allergies  Allergen Reactions  . Apap [Acetaminophen]     Reaction unknown  . Ceftriaxone Itching  . Ciprofloxacin Nausea And Vomiting  . Codeine Other (See Comments)    halucinations  . Demerol Other (See Comments)    halucinations  . Metronidazole Itching  . Morphine And Related Other (See Comments)    halucinations  . Nitrofurantoin Monohyd Macro Other (See Comments)    Per MAR  . Pregabalin Other (See Comments)    unknown  . Septra [Bactrim] Other (See Comments)    Unknown cant tolerate DS   Patient Measurements: Height: 5' 10.5" (179.1 cm) Weight: 205 lb 7.5 oz (93.2 kg) IBW/kg (Calculated) : 69.65  Vital Signs: Temp: 99 F (37.2 C) (10/25 0601) Temp src: Oral (10/25 0601) BP: 151/55 mmHg (10/25 0601) Pulse Rate: 73 (10/25 0601) Intake/Output from previous day: 10/24 0701 - 10/25 0700 In: 700.8 [I.V.:700.8] Out: -  Intake/Output from this shift:    Labs:  Recent Labs  07/19/13 0359 07/20/13 0715  WBC 27.0* 8.3  HGB 12.4 11.8*  PLT 238 210  CREATININE 1.32* 1.15*   Estimated Creatinine Clearance: 47.9 ml/min (by C-G formula based on Cr of 1.15). No results found for this basename: VANCOTROUGH, Leodis Binet, VANCORANDOM, GENTTROUGH, GENTPEAK, GENTRANDOM, TOBRATROUGH, TOBRAPEAK, TOBRARND, AMIKACINPEAK, AMIKACINTROU, AMIKACIN,  in the last 72 hours   Microbiology: Recent Results (from the past 720 hour(s))  CULTURE, ROUTINE-ABSCESS     Status: None   Collection Time    07/19/13  4:06 PM      Result Value Range Status   Specimen Description DRAINAGE   Final   Special Requests Normal   Final   Gram Stain     Final   Value: NO WBC SEEN     NO SQUAMOUS EPITHELIAL CELLS SEEN     NO ORGANISMS SEEN     Performed at [facility]   Culture PENDING   Incomplete   Report Status PENDING   Incomplete  MRSA PCR SCREENING     Status: None    Collection Time    07/20/13  1:30 AM      Result Value Range Status   MRSA by PCR NEGATIVE  NEGATIVE Final   Comment:            The GeneXpert MRSA Assay (FDA     approved for NASAL specimens     only), is one component of a     comprehensive MRSA colonization     surveillance program. It is not     intended to diagnose MRSA     infection nor to guide or     monitor treatment for     MRSA infections.   Medical History: Past Medical History  Diagnosis Date  . Peripheral neuropathy   . Hypertension   . TIA (transient ischemic attack)   . Elevated sed rate   . Temporal arteritis     chronic steroids  . Hypothyroidism   . Colon cancer     colon  . History of hernia repair   . S/P rotator cuff surgery     right  . Diastolic CHF   . Cardiomegaly - hypertensive   . Chronic anemia   . Chronic steroid use   . Dementia   . Shortness of breath   . Hyperlipidemia   . CHF (congestive heart failure)   . Cardiomyopathy due to  hypertension   . Dependence on supplemental oxygen   . Chronic UTI   . Dementia   . Giant cell arteritis    Medications:  Anti-infectives   Start     Dose/Rate Route Frequency Ordered Stop   07/20/13 1000  piperacillin-tazobactam (ZOSYN) IVPB 3.375 g     3.375 g 12.5 mL/hr over 240 Minutes Intravenous Every 8 hours 07/20/13 0953     07/19/13 1800  vancomycin (VANCOCIN) IVPB 750 mg/150 ml premix     750 mg 150 mL/hr over 60 Minutes Intravenous Every 12 hours 07/19/13 1653     07/19/13 1600  fluconazole (DIFLUCAN) tablet 100 mg     100 mg Oral Daily 07/19/13 1344 07/23/13 0959   07/19/13 0745  cefTRIAXone (ROCEPHIN) 1 g in dextrose 5 % 50 mL IVPB     1 g 100 mL/hr over 30 Minutes Intravenous  Once 07/19/13 0741 07/19/13 1030   07/19/13 0645  metroNIDAZOLE (FLAGYL) IVPB 500 mg     500 mg 100 mL/hr over 60 Minutes Intravenous  Once 07/19/13 0635 07/19/13 0930     Assessment: 83 yoF with recurrent para-spinal abscess, first noted 9/14; cultures  negative and treated with Ceftin + Doxycycline x 2 wk as outpatient. To ED with 2 day hx N/V, abd pain, fever. Leukocytosis 27K on admit  Vancomycin begun 10/24 at 750mg  q12  Add Zosyn today, renal fx adequate  Goal of Therapy:  Vancomycin trough level 15-20 mcg/ml  Plan:   Continue Vancomycin 750mg  q12, trough at steady state  Add Zosyn 3.375 gm q8h-4 hr infusion  Otho Bellows PharmD Pager [phone] 07/20/2013, 9:59 AM

## 2013-07-20 NOTE — Evaluation (Signed)
Physical Therapy Evaluation Patient Details Name: [name] MRN: [id number] DOB: 1930-08-08 Today's Date: 07/20/2013 Time: 7829-5621 PT Time Calculation (min): 22 min  PT Assessment / Plan / Recommendation History of Present Illness  The patient is a 79 y.o. year-old female with history of HTN/HLD, PAF not on A/C, chronic diastolic heart failure, temporal arteritis on chronic steroids, recurrent UTI, hx of c. Diff, dementia who presents with nausea, vomiting.  The patient was last at their baseline health a little over a month ago.  She was hospitalized at [facility] with abdominal pain and was found to a fluid collection around the lumbar spine which was sampled.  She was started on empiric antibiotics, however, her culture was negative and she was discharged to follow up with general surgery.  She has been feeling well and living at [facility] where she has been getting around with a rolling walker.  Approximately two days ago, she developed nausea with vomiting which was somewhat bilious and which occurred about 3-4 times per day.   Clinical Impression  Pt admitted with above deficits. Pt currently with functional limitations due to the deficits listed below (see PT Problem List).  Pt will benefit from skilled PT to increase their independence and safety with mobility to allow discharge to the venue listed below.      PT Assessment  Patient needs continued PT services    Follow Up Recommendations  SNF    Does the patient have the potential to tolerate intense rehabilitation      Barriers to Discharge        Equipment Recommendations  None recommended by PT    Recommendations for Other Services     Frequency Min 3X/week    Precautions / Restrictions     Pertinent Vitals/Pain Denies pain      Mobility  Bed Mobility Bed Mobility: Supine to Sit Supine to Sit: 3: Mod assist;HOB elevated Details for Bed Mobility Assistance: cues for technique and self assist   Transfers Transfers: Sit to Stand;Stand to Sit;Stand Pivot Transfers Sit to Stand: 1: +2 Total assist;From bed Sit to Stand: Patient Percentage: 40% Stand to Sit: To chair/3-in-1 Stand to Sit: Patient Percentage: 40% Stand Pivot Transfers: 1: +2 Total assist Stand Pivot Transfers: Patient Percentage: 40% Details for Transfer Assistance: 2 attempts to stand, pt felt weak and had to sit with intial stand attempt; pt lowered back to bed with +2 assist; BP prior to first standing 138/52, pt with c/o dizziness which got better with time Ambulation/Gait Ambulation/Gait Assistance: Not tested (comment)    Exercises     PT Diagnosis: Difficulty walking;Generalized weakness  PT Problem List: Decreased strength;Decreased activity tolerance;Decreased balance PT Treatment Interventions: DME instruction;Gait training;Functional mobility training;Therapeutic activities;Therapeutic exercise;Patient/family education     PT Goals(Current goals can be found in the care plan section) Acute Rehab PT Goals PT Goal Formulation: With patient Time For Goal Achievement: 07/27/13 Potential to Achieve Goals: Good  Visit Information  Last PT Received On: 07/20/13 Assistance Needed: +2 History of Present Illness: The patient is a 79 y.o. year-old female with history of HTN/HLD, PAF not on A/C, chronic diastolic heart failure, temporal arteritis on chronic steroids, recurrent UTI, hx of c. Diff, dementia who presents with nausea, vomiting.  The patient was last at their baseline health a little over a month ago.  She was hospitalized at [facility] with abdominal pain and was found to a fluid collection around the lumbar spine which was sampled.  She  was started on empiric antibiotics, however, her culture was negative and she was discharged to follow up with general surgery.  She has been feeling well and living at [facility] where she has been getting around with a rolling walker.  Approximately two days  ago, she developed nausea with vomiting which was somewhat bilious and which occurred about 3-4 times per day.        Prior Functioning  Home Living Family/patient expects to be discharged to:: Assisted living Prior Function Level of Independence: Needs assistance Gait / Transfers Assistance Needed: pt reports having assistanc ewhen amb or at least supervision Communication Communication: No difficulties    Cognition  Cognition Arousal/Alertness: Awake/alert Behavior During Therapy: WFL for tasks assessed/performed Overall Cognitive Status: Within Functional Limits for tasks assessed    Extremity/Trunk Assessment Upper Extremity Assessment Upper Extremity Assessment: Generalized weakness Lower Extremity Assessment Lower Extremity Assessment: Generalized weakness   Balance Static Sitting Balance Static Sitting - Balance Support: Feet unsupported;Bilateral upper extremity supported Static Sitting - Level of Assistance: 5: Stand by assistance;4: Min assist Static Standing Balance Static Standing - Balance Support: Bilateral upper extremity supported;During functional activity Static Standing - Level of Assistance: 1: +2 Total assist  End of Session PT - End of Session Equipment Utilized During Treatment: Gait belt Activity Tolerance: Patient limited by fatigue Patient left: in chair;with call bell/phone within reach Nurse Communication: Mobility status  GP     [facility] 07/20/2013, 4:31 PM

## 2013-07-20 NOTE — Progress Notes (Signed)
Clinical Social Work Department BRIEF PSYCHOSOCIAL ASSESSMENT 07/20/2013  Patient:  [name], [name]     Account Number:  [id number]     Admit date:  07/19/2013  Clinical Social Worker:  Doroteo Glassman  Date/Time:  07/20/2013 02:50 PM  Referred by:  Physician  Date Referred:  07/20/2013 Referred for  ALF Placement   Other Referral:   Interview type:  Patient Other interview type:    PSYCHOSOCIAL DATA Living Status:  FACILITY Admitted from facility:   Level of care:  Assisted Living Primary support name:  [name] Primary support relationship to patient:  CHILD, ADULT Degree of support available:   strong    CURRENT CONCERNS Current Concerns  Post-Acute Placement   Other Concerns:    SOCIAL WORK ASSESSMENT / PLAN Met with Pt to discuss d/c plans.    Pt is from [facility] and stated that she will return there uponi d/c.    CSW thanked Pt for her time.    Per [place] at [facility], ok for Pt to return upon d/c.    Weekday CSW to follow.   Assessment/plan status:  Psychosocial Support/Ongoing Assessment of Needs Other assessment/ plan:   Information/referral to community resources:   n/a    PATIENT'S/FAMILY'S RESPONSE TO PLAN OF CARE: Pt's reponse to her plan of care is positive; Pt is happy to return to [facility].   Providence Crosby, LCSWA Clinical Social Work [phone]

## 2013-07-21 LAB — CBC
HCT: 35.1 % — ABNORMAL LOW (ref 36.0–46.0)
Hemoglobin: 11.3 g/dL — ABNORMAL LOW (ref 12.0–15.0)
MCHC: 32.2 g/dL (ref 30.0–36.0)
RBC: 3.97 MIL/uL (ref 3.87–5.11)
WBC: 10.6 10*3/uL — ABNORMAL HIGH (ref 4.0–10.5)

## 2013-07-21 LAB — BASIC METABOLIC PANEL
BUN: 14 mg/dL (ref 6–23)
CO2: 26 mEq/L (ref 19–32)
Chloride: 101 mEq/L (ref 96–112)
GFR calc non Af Amer: 34 mL/min — ABNORMAL LOW (ref >90)
Glucose, Bld: 98 mg/dL (ref 70–99)
Potassium: 3.4 mEq/L — ABNORMAL LOW (ref 3.5–5.1)
Sodium: 135 mEq/L (ref 135–145)

## 2013-07-21 MED ORDER — VANCOMYCIN 50 MG/ML ORAL SOLUTION: 125.0000 mg | Freq: Two times a day (BID) | ORAL | Status: DC

## 2013-07-21 MED ORDER — POTASSIUM CHLORIDE CRYS ER 20 MEQ PO TBCR
20.0000 meq | EXTENDED_RELEASE_TABLET | Freq: Two times a day (BID) | ORAL | Status: DC
  Administered 2013-07-21 – 2013-07-24 (×7): 20 meq via ORAL
  Filled 2013-07-21 (×8): qty 1

## 2013-07-21 MED ORDER — VANCOMYCIN 50 MG/ML ORAL SOLUTION: 125.0000 mg | Freq: Every day | ORAL | Status: DC

## 2013-07-21 MED ORDER — POTASSIUM CHLORIDE IN NACL 20-0.9 MEQ/L-% IV SOLN
INTRAVENOUS | Status: DC
  Administered 2013-07-21 – 2013-07-24 (×6): via INTRAVENOUS
  Filled 2013-07-21 (×7): qty 1000

## 2013-07-21 MED ORDER — VANCOMYCIN 50 MG/ML ORAL SOLUTION: 125.0000 mg | ORAL | Status: DC

## 2013-07-21 MED ORDER — VANCOMYCIN 50 MG/ML ORAL SOLUTION
125.0000 mg | Freq: Four times a day (QID) | ORAL | Status: DC
  Administered 2013-07-21 – 2013-07-24 (×12): 125 mg via ORAL
  Filled 2013-07-21 (×14): qty 2.5

## 2013-07-21 MED ORDER — FLUCONAZOLE IN SODIUM CHLORIDE 200-0.9 MG/100ML-% IV SOLN
200.0000 mg | INTRAVENOUS | Status: DC
  Administered 2013-07-21 – 2013-07-23 (×3): 200 mg via INTRAVENOUS
  Filled 2013-07-21 (×3): qty 100

## 2013-07-21 NOTE — Progress Notes (Signed)
[facility]    Date of Admission:  07/19/2013   Total days of antibiotics 3        Day 2 piptazo        Day 2 vanco        1 day of ctx        1 day of ceftriaxone        Day 2 po vanco        Day 1 fluconazole   ID: [name] is a 79 y.o. female with  PMH of hypertension, hyperlipidemia, paroxysmal atrial fibrillation (not on anticoagulation), chronic diastolic CHF, temporal arteritis on chronic steroids, recurrent UTI, history of C. Difficile, recent hospitalization 05/30/13-06/01/13 for treatment of paraspinal fluid collection (culture negative) and dementia who was admitted on 07/19/2013 with nausea and vomiting, abdominal pain and WBC of 27K with left shift. CT scan of the abdomen and pelvis showed fluid collection posterior to L4 in the posterior soft tissues of the back (although smaller) with surrounding stranding in this area. Inflammatory etiology suspected. Underwent percutaneous drainage on 07/19/2013 shows no growth to date. CDiff PCR +  Principal Problem:   Abdominal pain, acute, left lower quadrant Active Problems:   Generalized weakness   Hypothyroidism   Depression   Dementia   Chronic steroid use   Chronic anemia   Diastolic CHF   Temporal arteritis   Hypertension   Leukocytosis   Steroid long-term use   Nausea and vomiting   CKD (chronic kidney disease) stage 3, GFR 30-59 ml/min    Subjective: Afebrile, less abdominal pain. Leukocytosis improved  Medications:  . amLODipine  5 mg Oral q morning - 10a  . dipyridamole-aspirin  1 capsule Oral BID  . donepezil  10 mg Oral QHS  . enoxaparin (LOVENOX) injection  40 mg Subcutaneous Q24H  . ferrous sulfate  325 mg Oral Q breakfast  . fluconazole (DIFLUCAN) IV  200 mg Intravenous Q24H  . furosemide  40 mg Oral q morning - 10a  . hydrALAZINE  10 mg Oral Q8H  . levothyroxine  150 mcg Oral QAC breakfast  . nystatin   Topical BID  . pantoprazole  40 mg Oral Daily  . potassium  chloride  20 mEq Oral BID  . predniSONE  10 mg Oral QODAY  . predniSONE  15 mg Oral QODAY  . saccharomyces boulardii  250 mg Oral BID  . sertraline  50 mg Oral q morning - 10a  . vancomycin  125 mg Oral QID   Followed by  . [START ON 07/28/2013] vancomycin  125 mg Oral BID   Followed by  . [START ON 08/05/2013] vancomycin  125 mg Oral Daily   Followed by  . [START ON 08/12/2013] vancomycin  125 mg Oral QODAY   Followed by  . [START ON 08/20/2013] vancomycin  125 mg Oral Q3 days    Objective: Vital signs in last 24 hours: Temp:  [97.7 F (36.5 C)-98 F (36.7 C)] 98 F (36.7 C) (10/26 1415) Pulse Rate:  [62-73] 73 (10/26 1415) Resp:  [20-22] 20 (10/26 1415) BP: (120-148)/(45-58) 120/58 mmHg (10/26 1415) SpO2:  [95 %-99 %] 95 % (10/26 1415)  Constitutional:oriented to person, place, and time.appears well-developed and well-nourished. Chronically ill appearing. Non toxic  HENT:  Mouth/Throat: Oropharynx is clear and moist. No oropharyngeal exudate.  Cardiovascular: Normal rate, regular rhythm and normal heart sounds. Exam reveals no gallop and no friction rub.  No murmur heard.  Pulmonary/Chest: Effort  normal and breath sounds normal. No respiratory distress. has no wheezes.  Abdominal: Soft. Bowel sounds are normal.exhibits no distension. TTP in LLQ no rebound Lymphadenopathy: no cervical adenopathy.  Skin: Skin is warm and dry. No rash noted. No erythema.     Lab Results  Recent Labs  07/20/13 0715 07/21/13 0404  WBC 8.3 10.6*  HGB 11.8* 11.3*  HCT 37.4 35.1*  NA 137 135  K 3.4* 3.4*  CL 101 101  CO2 25 26  BUN 12 14  CREATININE 1.15* 1.42*   Liver Panel  Recent Labs  07/19/13 0359 07/20/13 0715  PROT 7.1 6.2  ALBUMIN 3.1* 2.5*  AST 16 18  ALT 15 11  ALKPHOS 80 86  BILITOT 0.2* 0.4    Microbiology: 10/25 cdiff POSITIVe 10/24 aspirate NGTD 10/24 blood cx x 2 NGTD  Studies/Results: US Aspiration  07/19/2013   INDICATION: Indeterminate recurrent  fluid collection within the subcutaneous tissues of the left lower back.  EXAM: ULTRASOUND GUIDED SOFT TISSUE FLUID COLLECTION ASPIRATION  MEDICATIONS: None  TECHNIQUE: Informed written consent was obtained from the patient after a discussion of the risks, benefits and alternatives to treatment. Questions regarding the procedure were encouraged and answered. A timeout was performed prior to the initiation of the procedure.  The patient was positioned right lateral decubitus in her bed (patient is unable to lie prone). Preprocedural ultrasound scanning in demonstrates a small easily compressible fluid collection within the left side of the midline of the lower back which correlates with the recurrent fluid collection seen on preceding abdominal CT.  The left lower back was prepped and draped in the usual sterile fashion, and a sterile drape was applied covering the operative field. Maximum barrier sterile technique with sterile gowns and gloves were used for the procedure. A timeout was performed prior to the initiation of the procedure.  Under direct ultrasound guidance, a 5-French multi side-hole Yueh sheath needle was utilized to access the fluid collection after the overlying soft tissues was anesthetized with 1% lidocaine with epinephrine. A total of approximately 20 ml of clear, straw-colored fluid was aspirated.  Postprocedural ultrasound imaging demonstrated near complete resolution of the fluid collection. The patient tolerated the procedure well without immediate postprocedural complication.  ANESTHESIA/SEDATION: None  COMPARISON:  Ultrasound-guided fluid collection -05/30/2013 ; CT abdomen and pelvis - earlier same day  COMPLICATIONS: None immediate  FINDINGS: Ultrasound scanning demonstrates and easily compressible anechoic fluid collection within the midline of the left lower back which correlates with the findings on preprocedural abdominal CT. Under direct ultrasound guidance, approximately 20 cc of  serous fluid was aspirated. The aspirated samples was sent to the laboratory for analysis.  IMPRESSION: Technically successful aspiration of indeterminate subcutaneous fluid collection within in the midline of the left lower back yielding approximately 20 cc of serous fluid. The aspirated sample was sent to the laboratory for analysis.   Electronically Signed   By: Simonne Come M.D.   On: 07/19/2013 17:43     Assessment/Plan:clinical presentation of N/V/abdominal pain with leukocytosis appears more consistent with cdifficile colitis rather than diverticulitis. Fluid collection  in the soft tissues posterior to the level of L4 on the left. This fluid likely sterile from prior treatment and not contributory to her clinical presentation.   1) continue to treat with vancomycin 125mg  QID with long taper for recurrent cdifficile colitis. Relapse likely due to recent antibiotic exposure  2) soft tissue fluid collection near L4 level = negative at 48hrs. Will discontinue to vancomycin  and piptazo  3) aki = should improve with fluids and discontinuation of vanco/piptazo  4) thrush = treat for 7- 10 days.  Cliffton Asters to follow on Monday  Drue Second [facility] Cell: [phone] Pager: [phone]  07/21/2013, 4:38 PM

## 2013-07-21 NOTE — Evaluation (Addendum)
Occupational Therapy Evaluation Patient Details Name: [name] MRN: [id number] DOB: 1930/02/26 Today's Date: 07/21/2013 Time: 1308-6578 OT Time Calculation (min): 43 min  OT Assessment / Plan / Recommendation History of present illness The patient is a 79 y.o. year-old female with history of HTN/HLD, PAF not on A/C, chronic diastolic heart failure, temporal arteritis on chronic steroids, recurrent UTI, hx of c. Diff, dementia who presents with nausea, vomiting.  The patient was last at their baseline health a little over a month ago.  She was hospitalized at [facility] with abdominal pain and was found to a fluid collection around the lumbar spine which was sampled.  She was started on empiric antibiotics, however, her culture was negative and she was discharged to follow up with general surgery.  She has been feeling well and living at [facility] where she has been getting around with a rolling walker.  Approximately two days ago, she developed nausea with vomiting which was somewhat bilious and which occurred about 3-4 times per day.    Clinical Impression   Pt with frequent loose BM and nursing tech in to help with cleanup. Worked on rolling R and L multiple times for cleanup and then OOB to chair. Pt very tender and painful with cleanup due to reddened periareas. Will benefit from skilled OT services to maximize ADL independence for d/c next venue.    OT Assessment  Patient needs continued OT Services    Follow Up Recommendations  SNF;Supervision/Assistance - 24 hour    Barriers to Discharge      Equipment Recommendations  3 in 1 bedside comode    Recommendations for Other Services    Frequency  Min 2X/week    Precautions / Restrictions Precautions Precautions: Fall Restrictions Weight Bearing Restrictions: No   Pertinent Vitals/Pain Not rated. Painful periareas with cleanup of BM.    ADL  Grooming: Simulated;Wash/dry face;Set up Where Assessed - Grooming:  Supine, head of bed up Upper Body Bathing: Simulated;Chest;Right arm;Left arm;Abdomen;Moderate assistance Where Assessed - Upper Body Bathing: Unsupported sitting Lower Body Bathing: Simulated;+2 Total assistance Lower Body Bathing: Patient Percentage: 40% Where Assessed - Lower Body Bathing: Supported sit to stand Upper Body Dressing: Simulated;Moderate assistance (with gown due to lines/tubes) Where Assessed - Upper Body Dressing: Unsupported sitting Lower Body Dressing: Simulated;+2 Total assistance Lower Body Dressing: Patient Percentage: 20% Where Assessed - Lower Body Dressing: Supported sit to stand Toilet Transfer: Simulated;+2 Total assistance Toilet Transfer: Patient Percentage: 70% Statistician Method: Stand pivot Toileting - Clothing Manipulation and Hygiene: +2 Total assistance Toileting - Clothing Manipulation and Hygiene: Patient Percentage: 10% (see below) Where Assessed - Toileting Clothing Manipulation and Hygiene: Standing Equipment Used: Rolling walker ADL Comments: Pt with loose stool in the bed when OT arrived. Nursing tech called in to assist and pt with more loose stool coming out. Nursing in to insert foley catheter. Pt very red in periareas and painful for her to be cleaned. Nursing aware of all. +2 for safety as pt does report some dizziness and also for helping with managing stood cleanup.    OT Diagnosis: Generalized weakness  OT Problem List: Decreased strength;Decreased knowledge of use of DME or AE OT Treatment Interventions: Self-care/ADL training;DME and/or AE instruction;Therapeutic activities;Patient/family education   OT Goals(Current goals can be found in the care plan section) Acute Rehab OT Goals Patient Stated Goal: none stated OT Goal Formulation: With patient Time For Goal Achievement: 08/04/13 Potential to Achieve Goals: Good  Visit Information  Last OT  Received On: 07/21/13 Assistance Needed: +2 History of Present Illness: The patient  is a 79 y.o. year-old female with history of HTN/HLD, PAF not on A/C, chronic diastolic heart failure, temporal arteritis on chronic steroids, recurrent UTI, hx of c. Diff, dementia who presents with nausea, vomiting.  The patient was last at their baseline health a little over a month ago.  She was hospitalized at [facility] with abdominal pain and was found to a fluid collection around the lumbar spine which was sampled.  She was started on empiric antibiotics, however, her culture was negative and she was discharged to follow up with general surgery.  She has been feeling well and living at [facility] where she has been getting around with a rolling walker.  Approximately two days ago, she developed nausea with vomiting which was somewhat bilious and which occurred about 3-4 times per day.        Prior Functioning     Home Living Family/patient expects to be discharged to:: Assisted living Prior Function Level of Independence: Needs assistance Gait / Transfers Assistance Needed: pt reports having assistanc ewhen amb or at least supervision ADL's / Homemaking Assistance Needed: pt has assist with bathing, dressing and supervision with toileting. she states she has been transferring to dining room in wheelchair mostly recently. Communication Communication: No difficulties         Vision/Perception     Cognition  Cognition Arousal/Alertness: Awake/alert Behavior During Therapy: WFL for tasks assessed/performed Overall Cognitive Status: Within Functional Limits for tasks assessed    Extremity/Trunk Assessment Upper Extremity Assessment Upper Extremity Assessment: Generalized weakness     Mobility Bed Mobility Bed Mobility: Rolling Left;Rolling Right;Left Sidelying to Sit Rolling Right: 4: Min assist;With rail Rolling Left: 4: Min assist;With rail Left Sidelying to Sit: 4: Min assist;3: Mod assist;HOB elevated Details for Bed Mobility Assistance: assist for trunk fully to  upright and balance, cues for hand placement. Transfers Transfers: Sit to Stand;Stand to Sit Sit to Stand: 1: +2 Total assist;With upper extremity assist;From bed Sit to Stand: Patient Percentage: 70% Stand to Sit: 1: +2 Total assist;With upper extremity assist;To chair/3-in-1 Stand to Sit: Patient Percentage: 70% Details for Transfer Assistance: assist wtih rising and steadying. cues for hand placement. Assist to control descent to chair and cues hand placement.     Exercise     Balance Static Sitting Balance Static Sitting - Balance Support: Bilateral upper extremity supported;Feet supported Static Sitting - Level of Assistance: 4: Min assist;5: Stand by assistance   End of Session OT - End of Session Equipment Utilized During Treatment: Gait belt;Rolling walker Activity Tolerance: Patient limited by pain Patient left: in chair;with call bell/phone within reach  GO     Lennox Laity [phone] 07/21/2013, 11:21 AM

## 2013-07-21 NOTE — Progress Notes (Signed)
[facility] PROGRESS NOTE  [name] UJW:[id number] DOB: Feb 01, 1930 DOA: 07/19/2013 PCP: Cain Saupe, MD  Brief narrative: [name] is an 79 y.o. female with a PMH of hypertension, hyperlipidemia, paroxysmal atrial fibrillation (not on anticoagulation), chronic diastolic CHF, temporal arteritis on chronic steroids, recurrent UTI, history of C. Difficile, recent hospitalization 05/30/13-06/01/13 for treatment of paraspinal fluid collection (culture negative) and dementia who was admitted on 07/19/2013 with nausea and vomiting. CT scan of the abdomen and pelvis showed fluid collection posterior to L4 in the posterior soft tissues of the back (although smaller) with surrounding stranding in this area. Inflammatory etiology suspected.  Underwent percutaneous drainage on 07/19/2013.  Assessment/Plan: Principal Problem:   Abdominal pain, acute, left lower quadrant with recurrent fluid collection posterior to L4 -Previously hospitalized 05/30/13-06/01/2013. Underwent aspiration of fluid collection per IR with cultures of aspirated negative to date. ID recommended a two-week course of Ceftin/doxycycline. -Underwent repeat percutaneous drainage per IR on 07/19/13.  Cultures negative to date. -Continue supportive care with Zofran, gentle hydration. -Blood cultures negative to date, stool cultures not sent (probably not needed), C. difficile PCR +. -Seen by ID 07/19/13.  Recommendations noted. -Continue empiric vancomycin/Zosyn per ID recs. Active Problems:   Acute renal failure -Will start IV fluids. -Likely related to GI losses/prerenal etiology.   C. Difficile Diarrhea -Started oral Vancomycin 07/20/13. WBC declining.   Thrush -Continue diflucan.   Generalized weakness -PT/OT evaluations requested.  PT recommends SNF.   Hypothyroidism -Continue Synthroid.   Depression -Continue Zoloft.   Dementia -Continue Aricept.   Chronic anemia -Hemoglobin stable. Continue iron  therapy.   Chronic Diastolic CHF -Monitor I and O balance closely with IV fluids. Lasix now on hold secondary to acute renal failure. Chest x-ray negative for pulmonary edema.   Temporal arteritis / Chronic steroid use -Continue prednisone.   Hypertension -Continue Norvasc, hydralazine and Lasix.   Leukocytosis -UA neg.  -CXR negative for consolidation, CT abd showed recurrent fluid collection which was drained.  -Followup cultures.  -Continue empiric vancomycin./Zosyn.    Nausea and vomiting -Continue Zofran as needed.   CKD (chronic kidney disease) stage 3, GFR 30-59 ml/min -Creatinine stable at usual baseline.   History of TIA -Continue Aggrenox.   GERD -Continue Protonix.   Hypokalemia -Start supplement.  Code Status: DNR Family Communication: No family currently at the bedside.  [name] (daughter) updated by telephone.   Disposition Plan: From [facility]   Medical Consultants:  Dr. Simonne Come, Interventional radiology  Other Consultants:  Physical therapy  Occupational therapy  Anti-infectives:  Vancomycin 07/19/2013--->  Diflucan 07/19/2013---> 07/23/2013  Rocephin 07/19/2013---> 07/19/2013  Flagyl 07/19/2013---> 07/19/2013  Zosyn 07/20/2013--->  Oral Vancomycin 07/20/13--->   HPI/Subjective: [name] feels well other than some abdominal discomfort. Appetite is good. No nausea or vomiting. Again, she cannot tell me if she's had loose stools. Nursing staff however does report frequent loose stools.  Objective: Filed Vitals:   07/20/13 1100 07/20/13 1415 07/20/13 2041 07/21/13 0533  BP: 136/58 135/43 130/45 148/48  Pulse: 67 76 65 62  Temp:  98.6 F (37 C) 97.8 F (36.6 C) 97.7 F (36.5 C)  TempSrc:  Oral Oral Oral  Resp: 20 18 20 22   Height:      Weight:      SpO2: 98% 95% 98% 99%    Intake/Output Summary (Last 24 hours) at 07/21/13 0733 Last data filed at 07/20/13 1843  Gross per 24 hour  Intake 1388.33 ml  Output  0 ml  Net 1388.33 ml    Exam: Gen:  NAD Cardiovascular:  RRR, No M/R/G Respiratory:  Lungs CTAB Gastrointestinal:  Abdomen soft, NT/ND, + BS Extremities:  No C/E/C  Data Reviewed: Basic Metabolic Panel:  Recent Labs Lab 07/19/13 0359 07/20/13 0715 07/21/13 0404  NA 136 137 135  K 3.5 3.4* 3.4*  CL 97 101 101  CO2 27 25 26   GLUCOSE 113* 100* 98  BUN 20 12 14   CREATININE 1.32* 1.15* 1.42*  CALCIUM 9.4 8.9 8.9  MG 1.9  --   --    GFR Estimated Creatinine Clearance: 38.8 ml/min (by C-G formula based on Cr of 1.42). Liver Function Tests:  Recent Labs Lab 07/19/13 0359 07/20/13 0715  AST 16 18  ALT 15 11  ALKPHOS 80 86  BILITOT 0.2* 0.4  PROT 7.1 6.2  ALBUMIN 3.1* 2.5*   CBC:  Recent Labs Lab 07/19/13 0359 07/20/13 0715 07/21/13 0404  WBC 27.0* 8.3 10.6*  NEUTROABS 24.0*  --   --   HGB 12.4 11.8* 11.3*  HCT 39.3 37.4 35.1*  MCV 88.1 88.8 88.4  PLT 238 210 222   BNP (last 3 results)  Recent Labs  10/08/12 1723 10/12/12 0530 01/23/13 1640  PROBNP 1356.0* 8213.0* 1346.0*   Microbiology Recent Results (from the past 240 hour(s))  CULTURE, BLOOD (ROUTINE X 2)     Status: None   Collection Time    07/19/13  1:19 PM      Result Value Range Status   Specimen Description BLOOD LEFT ANTECUBITAL   Final   Special Requests BOTTLES DRAWN AEROBIC AND ANAEROBIC 3CC AND 2CC   Final   Culture  Setup Time     Final   Value: 07/19/2013 20:15     Performed at [facility]   Culture     Final   Value:        BLOOD CULTURE RECEIVED NO GROWTH TO DATE CULTURE WILL BE HELD FOR 5 DAYS BEFORE ISSUING A FINAL NEGATIVE REPORT     Performed at [facility]   Report Status PENDING   Incomplete  CULTURE, BLOOD (ROUTINE X 2)     Status: None   Collection Time    07/19/13  1:32 PM      Result Value Range Status   Specimen Description BLOOD RIGHT ANTECUBITAL   Final   Special Requests BOTTLES DRAWN AEROBIC ONLY 2CC   Final   Culture  Setup Time      Final   Value: 07/19/2013 20:14     Performed at [facility]   Culture     Final   Value:        BLOOD CULTURE RECEIVED NO GROWTH TO DATE CULTURE WILL BE HELD FOR 5 DAYS BEFORE ISSUING A FINAL NEGATIVE REPORT     Performed at [facility]   Report Status PENDING   Incomplete  CULTURE, ROUTINE-ABSCESS     Status: None   Collection Time    07/19/13  4:06 PM      Result Value Range Status   Specimen Description DRAINAGE   Final   Special Requests Normal   Final   Gram Stain     Final   Value: NO WBC SEEN     NO SQUAMOUS EPITHELIAL CELLS SEEN     NO ORGANISMS SEEN     Performed at [facility]   Culture     Final   Value: NO GROWTH 1 DAY  Performed at [facility]   Report Status PENDING   Incomplete  MRSA PCR SCREENING     Status: None   Collection Time    07/20/13  1:30 AM      Result Value Range Status   MRSA by PCR NEGATIVE  NEGATIVE Final   Comment:            The GeneXpert MRSA Assay (FDA     approved for NASAL specimens     only), is one component of a     comprehensive MRSA colonization     surveillance program. It is not     intended to diagnose MRSA     infection nor to guide or     monitor treatment for     MRSA infections.  CLOSTRIDIUM DIFFICILE BY PCR     Status: Abnormal   Collection Time    07/20/13  1:30 AM      Result Value Range Status   C difficile by pcr POSITIVE (*) NEGATIVE Final   Comment: CRITICAL RESULT CALLED TO, READ BACK BY AND VERIFIED WITH:     CARPENTER T.,RN 10.25.14 1145 BY JONESJ     Performed at [facility]     Procedures and Diagnostic Studies: Ct Abdomen Pelvis Wo Contrast  07/19/2013   CLINICAL DATA:  Lower abdominal pain with nausea and vomiting  EXAM: CT ABDOMEN AND PELVIS WITHOUT CONTRAST  TECHNIQUE: Multidetector CT imaging of the abdomen and pelvis was performed following the standard protocol without oral or intravenous contrast.  COMPARISON:  May 30, 2013  FINDINGS: There is  scarring in the lung bases bilaterally. There is mild lower lobe bronchiectatic change. Heart is mildly enlarged with scattered foci of coronary artery calcification.  The no focal liver lesions are identified on this non contrast enhanced study. Gallbladder is absent. There is no appreciable biliary duct dilatation.  Spleen appears normal. There is some fatty replacement in the pancreas. There is no pancreatic mass or inflammatory focus. Adrenals appear normal.  Kidneys bilaterally show no appreciable mass, calculus, or hydronephrosis on either side. There is no ureteral calculus or ureterectasis on either side.  There is again noted fluid in the soft tissues posterior to the level of L4 on the left. This fluid collection is smaller than on the previous study. It currently measures 5.3 x 5.7 x 3.3 cm. There is some mild stranding in the fat adjacent to this fluid collection the.  In the pelvis, there are several small urinary bladder diverticula. There is no pelvic mass or fluid collection. The uterus and appendix are absent.  There is no bowel obstruction. No free air or portal venous air.  There is a small ventral hernia which contains bowel but no apparent bowel compromise.  There is no ascites, adenopathy, or abscess in the abdomen or pelvis. There is no demonstrable diverticulitis. The periappendiceal region appears unremarkable.  There is atherosclerotic change in the aorta but no aneurysm. There is degenerative change in the lumbar spine. There are no blastic or lytic bone lesions.  IMPRESSION: Fluid collection posterior to L4 in the posterior soft tissues of the back is again noted although smaller. There is surrounding stranding in this area. Inflammatory etiology is suspected.  There are multiple urinary bladder diverticula without apparent inflammation.  The gallbladder, uterus, and appendix are absent.  No bowel obstruction. No diverticulitis. No abscess.   Electronically Signed   By: Bretta Bang  M.D.   On: 07/19/2013 09:20  Dg Chest 2 View  07/19/2013   CLINICAL DATA:  leukocytosis and nausea  EXAM: CHEST  2 VIEW  COMPARISON:  January 23, 2013  FINDINGS: There is no edema or consolidation. Heart is mildly enlarged with normal pulmonary vascularity. No adenopathy. There is atherosclerotic change in the aorta.  IMPRESSION: The mild cardiac enlargement. No edema or consolidation.   Electronically Signed   By: Bretta Bang M.D.   On: 07/19/2013 09:13   US Aspiration  07/19/2013   INDICATION: Indeterminate recurrent fluid collection within the subcutaneous tissues of the left lower back.  EXAM: ULTRASOUND GUIDED SOFT TISSUE FLUID COLLECTION ASPIRATION  MEDICATIONS: None  TECHNIQUE: Informed written consent was obtained from the patient after a discussion of the risks, benefits and alternatives to treatment. Questions regarding the procedure were encouraged and answered. A timeout was performed prior to the initiation of the procedure.  The patient was positioned right lateral decubitus in her bed (patient is unable to lie prone). Preprocedural ultrasound scanning in demonstrates a small easily compressible fluid collection within the left side of the midline of the lower back which correlates with the recurrent fluid collection seen on preceding abdominal CT.  The left lower back was prepped and draped in the usual sterile fashion, and a sterile drape was applied covering the operative field. Maximum barrier sterile technique with sterile gowns and gloves were used for the procedure. A timeout was performed prior to the initiation of the procedure.  Under direct ultrasound guidance, a 5-French multi side-hole Yueh sheath needle was utilized to access the fluid collection after the overlying soft tissues was anesthetized with 1% lidocaine with epinephrine. A total of approximately 20 ml of clear, straw-colored fluid was aspirated.  Postprocedural ultrasound imaging demonstrated near complete  resolution of the fluid collection. The patient tolerated the procedure well without immediate postprocedural complication.  ANESTHESIA/SEDATION: None  COMPARISON:  Ultrasound-guided fluid collection -05/30/2013 ; CT abdomen and pelvis - earlier same day  COMPLICATIONS: None immediate  FINDINGS: Ultrasound scanning demonstrates and easily compressible anechoic fluid collection within the midline of the left lower back which correlates with the findings on preprocedural abdominal CT. Under direct ultrasound guidance, approximately 20 cc of serous fluid was aspirated. The aspirated samples was sent to the laboratory for analysis.  IMPRESSION: Technically successful aspiration of indeterminate subcutaneous fluid collection within in the midline of the left lower back yielding approximately 20 cc of serous fluid. The aspirated sample was sent to the laboratory for analysis.   Electronically Signed   By: Simonne Come M.D.   On: 07/19/2013 17:43    Scheduled Meds: . amLODipine  5 mg Oral q morning - 10a  . dipyridamole-aspirin  1 capsule Oral BID  . donepezil  10 mg Oral QHS  . enoxaparin (LOVENOX) injection  40 mg Subcutaneous Q24H  . ferrous sulfate  325 mg Oral Q breakfast  . fluconazole  100 mg Oral Daily  . furosemide  40 mg Oral q morning - 10a  . hydrALAZINE  10 mg Oral Q8H  . levothyroxine  150 mcg Oral QAC breakfast  . nystatin   Topical BID  . pantoprazole  40 mg Oral Daily  . piperacillin-tazobactam (ZOSYN)  IV  3.375 g Intravenous Q8H  . predniSONE  10 mg Oral QODAY  . predniSONE  15 mg Oral QODAY  . saccharomyces boulardii  250 mg Oral BID  . sertraline  50 mg Oral q morning - 10a  . vancomycin  125 mg Oral QID  .  vancomycin  750 mg Intravenous Q12H   Continuous Infusions:    Time spent: 35 minutes with > 50% of time discussing current diagnostic test results, clinical impression and plan of care.    LOS: 2 days   Alandis Bluemel  [facility] Pager [phone].   *Please  note that the hospitalists switch teams on Wednesdays. Please call the flow manager at [phone] if you are having difficulty reaching the hospitalist taking care of this patient as she can update you and provide the most up-to-date pager number of provider caring for the patient. If 8PM-8AM, please contact night-coverage at [email or web address], password [facility]  07/21/2013, 7:33 AM

## 2013-07-22 LAB — BASIC METABOLIC PANEL
BUN: 13 mg/dL (ref 6–23)
CO2: 27 mEq/L (ref 19–32)
Calcium: 8.8 mg/dL (ref 8.4–10.5)
Chloride: 106 mEq/L (ref 96–112)
Creatinine, Ser: 1.23 mg/dL — ABNORMAL HIGH (ref 0.50–1.10)
Glucose, Bld: 96 mg/dL (ref 70–99)
Potassium: 4.5 mEq/L (ref 3.5–5.1)

## 2013-07-22 LAB — CBC
HCT: 33.7 % — ABNORMAL LOW (ref 36.0–46.0)
Hemoglobin: 10.6 g/dL — ABNORMAL LOW (ref 12.0–15.0)
MCH: 28 pg (ref 26.0–34.0)
MCV: 88.9 fL (ref 78.0–100.0)
RBC: 3.79 MIL/uL — ABNORMAL LOW (ref 3.87–5.11)
RDW: 15.1 % (ref 11.5–15.5)
WBC: 5.9 10*3/uL (ref 4.0–10.5)

## 2013-07-22 NOTE — Progress Notes (Signed)
CSW continuing to follow for disposition planning.   Pt admitted from [facility] and per w/e CSW psychosocial assessment pt plans to return.   PT recommending SNF, but CSW spoke with PT today after pt treatment and PT felt that pt progressing and likely at baseline and if ALF feels they can continue to meet pt needs then pt would be appropriate to return to ALF level of care with [facility] services.   CSW spoke with [facility] and discussed pt current level of functioning and facility feels pt near baseline and feels they could accept pt back upon discharge. Per facility, facility uses [facility] for [facility] services and pt had O2 at the facility prior to admission to the hospital.   CSW met with pt at bedside and pt would like to return to [facility].  CSW to continue to follow and facilitate pt discharge needs when pt medically ready for discharge.  Jacklynn Lewis, MSW, LCSWA  Clinical Social Work [phone]

## 2013-07-22 NOTE — Progress Notes (Signed)
[facility] PROGRESS NOTE  [name] ZOX:[id number] DOB: Apr 10, 1930 DOA: 07/19/2013 PCP: Cain Saupe, MD  Brief narrative: [name] is an 79 y.o. female with a PMH of hypertension, hyperlipidemia, paroxysmal atrial fibrillation (not on anticoagulation), chronic diastolic CHF, temporal arteritis on chronic steroids, recurrent UTI, history of C. Difficile, recent hospitalization 05/30/13-06/01/13 for treatment of paraspinal fluid collection (culture negative) and dementia who was admitted on 07/19/2013 with nausea and vomiting. CT scan of the abdomen and pelvis showed fluid collection posterior to L4 in the posterior soft tissues of the back (although smaller) with surrounding stranding in this area. Inflammatory etiology suspected.  Underwent percutaneous drainage on 07/19/2013.  Assessment/Plan: Principal Problem:     C. Difficile Diarrhea complicated by acute renal failure/dehydration from frequent stools. -Started oral Vancomycin 07/20/13. WBC normalized. Will need prolonged taper given that this is a recurrence. -Continues to have high stool output and requires IV fluids to prevent worsening kidney function/dehydration.   Active problems:    Abdominal pain, acute, left lower quadrant with recurrent fluid collection posterior to L4 -Previously hospitalized 05/30/13-06/01/2013. Underwent aspiration of fluid collection per IR with cultures of aspirated negative to date. ID recommended a two-week course of Ceftin/doxycycline. -Underwent repeat percutaneous drainage per IR on 07/19/13.  Cultures negative to date. IV vancomycin/Zosyn discontinued by ID 07/21/2013. -Continue supportive care with Zofran, gentle hydration. -Blood cultures negative to date, stool cultures not sent (probably not needed), C. difficile PCR +. Continue oral vancomycin.   Acute renal failure in the setting of stage III chronic kidney disease -Creatinine improving with IV fluids. -Likely related to GI  losses/prerenal etiology.   Thrush -Continue diflucan.   Generalized weakness -PT/OT evaluations requested.  PT recommends SNF.   Hypothyroidism -Continue Synthroid.   Depression -Continue Zoloft.   Dementia -Continue Aricept.   Chronic anemia -Hemoglobin stable. Continue iron therapy.   Chronic Diastolic CHF -Monitor I and O balance closely with IV fluids. Lasix now on hold secondary to acute renal failure. Chest x-ray negative for pulmonary edema.   Temporal arteritis / Chronic steroid use -Continue prednisone.   Hypertension -Continue Norvasc and hydralazine. Lasix on hold secondary to rising creatinine.   Leukocytosis.  -Likely from C. difficile. Resolved.   Nausea and vomiting -Continue Zofran as needed.   History of TIA -Continue Aggrenox.   GERD -Continue Protonix.   Hypokalemia -Start supplement.  Code Status: DNR Family Communication: No family currently at the bedside.  [name] (daughter) updated by telephone 07/22/2013.   Disposition Plan: From [facility], ? Return versus SNF when diarrhea improved.   Medical Consultants:  Dr. Simonne Come, Interventional radiology  Dr. Judyann Munson, Infectious Disease  Other Consultants:  Physical therapy  Occupational therapy  Anti-infectives:  Vancomycin 07/19/2013--->07/21/13  Diflucan 07/19/2013---> 07/23/2013  Rocephin 07/19/2013---> 07/19/2013  Flagyl 07/19/2013---> 07/19/2013  Zosyn 07/20/2013--->07/21/13  Oral Vancomycin 07/20/13--->   HPI/Subjective: [name] feels well. Appetite is good. Had 8 documented stools over the past 24 hours, down from 11 stools the day before. No nausea or vomiting. No complaints of pain.  Objective: Filed Vitals:   07/21/13 1028 07/21/13 1415 07/21/13 2059 07/22/13 0526  BP: 134/47 120/58 108/47 153/47  Pulse:  73 50 73  Temp:  98 F (36.7 C) 98 F (36.7 C) 98.2 F (36.8 C)  TempSrc:  Oral Oral Oral  Resp:  20 20 20   Height:      Weight:       SpO2:  95% 98% 98%  Intake/Output Summary (Last 24 hours) at 07/22/13 0729 Last data filed at 07/22/13 0600  Gross per 24 hour  Intake 2728.34 ml  Output   3600 ml  Net -871.66 ml    Exam: Gen:  NAD Cardiovascular:  RRR, No M/R/G Respiratory:  Lungs CTAB Gastrointestinal:  Abdomen soft, NT/ND, + BS Extremities:  No C/E/C  Data Reviewed: Basic Metabolic Panel:  Recent Labs Lab 07/19/13 0359 07/20/13 0715 07/21/13 0404 07/22/13 0415  NA 136 137 135 140  K 3.5 3.4* 3.4* 4.5  CL 97 101 101 106  CO2 27 25 26 27   GLUCOSE 113* 100* 98 96  BUN 20 12 14 13   CREATININE 1.32* 1.15* 1.42* 1.23*  CALCIUM 9.4 8.9 8.9 8.8  MG 1.9  --   --   --    GFR Estimated Creatinine Clearance: 44.8 ml/min (by C-G formula based on Cr of 1.23). Liver Function Tests:  Recent Labs Lab 07/19/13 0359 07/20/13 0715  AST 16 18  ALT 15 11  ALKPHOS 80 86  BILITOT 0.2* 0.4  PROT 7.1 6.2  ALBUMIN 3.1* 2.5*   CBC:  Recent Labs Lab 07/19/13 0359 07/20/13 0715 07/21/13 0404 07/22/13 0415  WBC 27.0* 8.3 10.6* 5.9  NEUTROABS 24.0*  --   --   --   HGB 12.4 11.8* 11.3* 10.6*  HCT 39.3 37.4 35.1* 33.7*  MCV 88.1 88.8 88.4 88.9  PLT 238 210 222 221   BNP (last 3 results)  Recent Labs  10/08/12 1723 10/12/12 0530 01/23/13 1640  PROBNP 1356.0* 8213.0* 1346.0*   Microbiology Recent Results (from the past 240 hour(s))  CULTURE, BLOOD (ROUTINE X 2)     Status: None   Collection Time    07/19/13  1:19 PM      Result Value Range Status   Specimen Description BLOOD LEFT ANTECUBITAL   Final   Special Requests BOTTLES DRAWN AEROBIC AND ANAEROBIC 3CC AND 2CC   Final   Culture  Setup Time     Final   Value: 07/19/2013 20:15     Performed at [facility]   Culture     Final   Value:        BLOOD CULTURE RECEIVED NO GROWTH TO DATE CULTURE WILL BE HELD FOR 5 DAYS BEFORE ISSUING A FINAL NEGATIVE REPORT     Performed at [facility]   Report Status PENDING    Incomplete  CULTURE, BLOOD (ROUTINE X 2)     Status: None   Collection Time    07/19/13  1:32 PM      Result Value Range Status   Specimen Description BLOOD RIGHT ANTECUBITAL   Final   Special Requests BOTTLES DRAWN AEROBIC ONLY 2CC   Final   Culture  Setup Time     Final   Value: 07/19/2013 20:14     Performed at [facility]   Culture     Final   Value:        BLOOD CULTURE RECEIVED NO GROWTH TO DATE CULTURE WILL BE HELD FOR 5 DAYS BEFORE ISSUING A FINAL NEGATIVE REPORT     Performed at [facility]   Report Status PENDING   Incomplete  CULTURE, ROUTINE-ABSCESS     Status: None   Collection Time    07/19/13  4:06 PM      Result Value Range Status   Specimen Description DRAINAGE   Final   Special Requests Normal   Final   Gram Stain  Final   Value: NO WBC SEEN     NO SQUAMOUS EPITHELIAL CELLS SEEN     NO ORGANISMS SEEN     Performed at [facility]   Culture     Final   Value: NO GROWTH 1 DAY     Performed at [facility]   Report Status PENDING   Incomplete  MRSA PCR SCREENING     Status: None   Collection Time    07/20/13  1:30 AM      Result Value Range Status   MRSA by PCR NEGATIVE  NEGATIVE Final   Comment:            The GeneXpert MRSA Assay (FDA     approved for NASAL specimens     only), is one component of a     comprehensive MRSA colonization     surveillance program. It is not     intended to diagnose MRSA     infection nor to guide or     monitor treatment for     MRSA infections.  CLOSTRIDIUM DIFFICILE BY PCR     Status: Abnormal   Collection Time    07/20/13  1:30 AM      Result Value Range Status   C difficile by pcr POSITIVE (*) NEGATIVE Final   Comment: CRITICAL RESULT CALLED TO, READ BACK BY AND VERIFIED WITH:     CARPENTER T.,RN 10.25.14 1145 BY JONESJ     Performed at [facility]     Procedures and Diagnostic Studies: Ct Abdomen Pelvis Wo Contrast  07/19/2013   CLINICAL DATA:  Lower abdominal  pain with nausea and vomiting  EXAM: CT ABDOMEN AND PELVIS WITHOUT CONTRAST  TECHNIQUE: Multidetector CT imaging of the abdomen and pelvis was performed following the standard protocol without oral or intravenous contrast.  COMPARISON:  May 30, 2013  FINDINGS: There is scarring in the lung bases bilaterally. There is mild lower lobe bronchiectatic change. Heart is mildly enlarged with scattered foci of coronary artery calcification.  The no focal liver lesions are identified on this non contrast enhanced study. Gallbladder is absent. There is no appreciable biliary duct dilatation.  Spleen appears normal. There is some fatty replacement in the pancreas. There is no pancreatic mass or inflammatory focus. Adrenals appear normal.  Kidneys bilaterally show no appreciable mass, calculus, or hydronephrosis on either side. There is no ureteral calculus or ureterectasis on either side.  There is again noted fluid in the soft tissues posterior to the level of L4 on the left. This fluid collection is smaller than on the previous study. It currently measures 5.3 x 5.7 x 3.3 cm. There is some mild stranding in the fat adjacent to this fluid collection the.  In the pelvis, there are several small urinary bladder diverticula. There is no pelvic mass or fluid collection. The uterus and appendix are absent.  There is no bowel obstruction. No free air or portal venous air.  There is a small ventral hernia which contains bowel but no apparent bowel compromise.  There is no ascites, adenopathy, or abscess in the abdomen or pelvis. There is no demonstrable diverticulitis. The periappendiceal region appears unremarkable.  There is atherosclerotic change in the aorta but no aneurysm. There is degenerative change in the lumbar spine. There are no blastic or lytic bone lesions.  IMPRESSION: Fluid collection posterior to L4 in the posterior soft tissues of the back is again noted although smaller. There is surrounding stranding in  this  area. Inflammatory etiology is suspected.  There are multiple urinary bladder diverticula without apparent inflammation.  The gallbladder, uterus, and appendix are absent.  No bowel obstruction. No diverticulitis. No abscess.   Electronically Signed   By: Bretta Bang M.D.   On: 07/19/2013 09:20   Dg Chest 2 View  07/19/2013   CLINICAL DATA:  leukocytosis and nausea  EXAM: CHEST  2 VIEW  COMPARISON:  January 23, 2013  FINDINGS: There is no edema or consolidation. Heart is mildly enlarged with normal pulmonary vascularity. No adenopathy. There is atherosclerotic change in the aorta.  IMPRESSION: The mild cardiac enlargement. No edema or consolidation.   Electronically Signed   By: Bretta Bang M.D.   On: 07/19/2013 09:13   US Aspiration  07/19/2013   INDICATION: Indeterminate recurrent fluid collection within the subcutaneous tissues of the left lower back.  EXAM: ULTRASOUND GUIDED SOFT TISSUE FLUID COLLECTION ASPIRATION  MEDICATIONS: None  TECHNIQUE: Informed written consent was obtained from the patient after a discussion of the risks, benefits and alternatives to treatment. Questions regarding the procedure were encouraged and answered. A timeout was performed prior to the initiation of the procedure.  The patient was positioned right lateral decubitus in her bed (patient is unable to lie prone). Preprocedural ultrasound scanning in demonstrates a small easily compressible fluid collection within the left side of the midline of the lower back which correlates with the recurrent fluid collection seen on preceding abdominal CT.  The left lower back was prepped and draped in the usual sterile fashion, and a sterile drape was applied covering the operative field. Maximum barrier sterile technique with sterile gowns and gloves were used for the procedure. A timeout was performed prior to the initiation of the procedure.  Under direct ultrasound guidance, a 5-French multi side-hole Yueh sheath needle was  utilized to access the fluid collection after the overlying soft tissues was anesthetized with 1% lidocaine with epinephrine. A total of approximately 20 ml of clear, straw-colored fluid was aspirated.  Postprocedural ultrasound imaging demonstrated near complete resolution of the fluid collection. The patient tolerated the procedure well without immediate postprocedural complication.  ANESTHESIA/SEDATION: None  COMPARISON:  Ultrasound-guided fluid collection -05/30/2013 ; CT abdomen and pelvis - earlier same day  COMPLICATIONS: None immediate  FINDINGS: Ultrasound scanning demonstrates and easily compressible anechoic fluid collection within the midline of the left lower back which correlates with the findings on preprocedural abdominal CT. Under direct ultrasound guidance, approximately 20 cc of serous fluid was aspirated. The aspirated samples was sent to the laboratory for analysis.  IMPRESSION: Technically successful aspiration of indeterminate subcutaneous fluid collection within in the midline of the left lower back yielding approximately 20 cc of serous fluid. The aspirated sample was sent to the laboratory for analysis.   Electronically Signed   By: Simonne Come M.D.   On: 07/19/2013 17:43    Scheduled Meds: . amLODipine  5 mg Oral q morning - 10a  . dipyridamole-aspirin  1 capsule Oral BID  . donepezil  10 mg Oral QHS  . enoxaparin (LOVENOX) injection  40 mg Subcutaneous Q24H  . ferrous sulfate  325 mg Oral Q breakfast  . fluconazole (DIFLUCAN) IV  200 mg Intravenous Q24H  . furosemide  40 mg Oral q morning - 10a  . hydrALAZINE  10 mg Oral Q8H  . levothyroxine  150 mcg Oral QAC breakfast  . nystatin   Topical BID  . pantoprazole  40 mg Oral Daily  . potassium  chloride  20 mEq Oral BID  . predniSONE  10 mg Oral QODAY  . predniSONE  15 mg Oral QODAY  . saccharomyces boulardii  250 mg Oral BID  . sertraline  50 mg Oral q morning - 10a  . vancomycin  125 mg Oral QID   Followed by  . [START  ON 07/28/2013] vancomycin  125 mg Oral BID   Followed by  . [START ON 08/05/2013] vancomycin  125 mg Oral Daily   Followed by  . [START ON 08/12/2013] vancomycin  125 mg Oral QODAY   Followed by  . [START ON 08/20/2013] vancomycin  125 mg Oral Q3 days   Continuous Infusions: . 0.9 % NaCl with KCl 20 mEq / L 100 mL/hr at 07/21/13 2238    Time spent: 25 minutes.    LOS: 3 days   [name]  [facility] Pager [phone].   *Please note that the hospitalists switch teams on Wednesdays. Please call the flow manager at [phone] if you are having difficulty reaching the hospitalist taking care of this patient as she can update you and provide the most up-to-date pager number of provider caring for the patient. If 8PM-8AM, please contact night-coverage at [email or web address], password [facility]  07/22/2013, 7:29 AM

## 2013-07-22 NOTE — Progress Notes (Signed)
OT Cancellation Note  Patient Details Name: LATANGELA MCCOMAS MRN: [id number] DOB: 10-07-1929   Cancelled Treatment:    Reason Eval/Treat Not Completed: Fatigue/lethargy limiting ability to participate - pt just back to bed.  Will reattempt  Betzalel Umbarger M 07/22/2013, 2:00 PM

## 2013-07-22 NOTE — Progress Notes (Signed)
Patient ID: [name], female   DOB: 1929-11-30, 79 y.o.   MRN: [id number]         [facility]    Date of Admission:  07/19/2013           Day 3 oral vancomycin        Day 2 fluconazole  Principal Problem:   Abdominal pain, acute, left lower quadrant Active Problems:   Generalized weakness   Hypothyroidism   Depression   Dementia   Chronic steroid use   Chronic anemia   Diastolic CHF   Temporal arteritis   Hypertension   Leukocytosis   Steroid long-term use   Nausea and vomiting   CKD (chronic kidney disease) stage 3, GFR 30-59 ml/min   . amLODipine  5 mg Oral q morning - 10a  . dipyridamole-aspirin  1 capsule Oral BID  . donepezil  10 mg Oral QHS  . enoxaparin (LOVENOX) injection  40 mg Subcutaneous Q24H  . ferrous sulfate  325 mg Oral Q breakfast  . fluconazole (DIFLUCAN) IV  200 mg Intravenous Q24H  . furosemide  40 mg Oral q morning - 10a  . hydrALAZINE  10 mg Oral Q8H  . levothyroxine  150 mcg Oral QAC breakfast  . nystatin   Topical BID  . pantoprazole  40 mg Oral Daily  . potassium chloride  20 mEq Oral BID  . predniSONE  10 mg Oral QODAY  . predniSONE  15 mg Oral QODAY  . saccharomyces boulardii  250 mg Oral BID  . sertraline  50 mg Oral q morning - 10a  . vancomycin  125 mg Oral QID   Followed by  . [START ON 07/28/2013] vancomycin  125 mg Oral BID   Followed by  . [START ON 08/05/2013] vancomycin  125 mg Oral Daily   Followed by  . [START ON 08/12/2013] vancomycin  125 mg Oral QODAY   Followed by  . [START ON 08/20/2013] vancomycin  125 mg Oral Q3 days    Subjective: She is feeling better. Her nausea and vomiting have resolved. She is still having some diarrhea and fecal incontinence. Neither she nor her nurses can tell me if her diarrhea has improved.  Objective: Temp:  [98 F (36.7 C)-98.2 F (36.8 C)] 98.2 F (36.8 C) (10/27 0526) Pulse Rate:  [50-73] 73 (10/27 0526) Resp:  [20] 20 (10/27 0526) BP:  (108-153)/(47-58) 153/47 mmHg (10/27 0526) SpO2:  [95 %-98 %] 98 % (10/27 0526)  General: She is smiling and sitting up in a chair eating lunch Oral: Thrush resolved Abdomen: Soft and nontender  Lab Results Lab Results  Component Value Date   WBC 5.9 07/22/2013   HGB 10.6* 07/22/2013   HCT 33.7* 07/22/2013   MCV 88.9 07/22/2013   PLT 221 07/22/2013    Lab Results  Component Value Date   CREATININE 1.23* 07/22/2013   BUN 13 07/22/2013   NA 140 07/22/2013   K 4.5 07/22/2013   CL 106 07/22/2013   CO2 27 07/22/2013    Lab Results  Component Value Date   ALT 11 07/20/2013   AST 18 07/20/2013   ALKPHOS 86 07/20/2013   BILITOT 0.4 07/20/2013      Microbiology: Recent Results (from the past 240 hour(s))  CULTURE, BLOOD (ROUTINE X 2)     Status: None   Collection Time    07/19/13  1:19 PM      Result Value Range Status   Specimen Description BLOOD  LEFT ANTECUBITAL   Final   Special Requests BOTTLES DRAWN AEROBIC AND ANAEROBIC 3CC AND 2CC   Final   Culture  Setup Time     Final   Value: 07/19/2013 20:15     Performed at [facility]   Culture     Final   Value:        BLOOD CULTURE RECEIVED NO GROWTH TO DATE CULTURE WILL BE HELD FOR 5 DAYS BEFORE ISSUING A FINAL NEGATIVE REPORT     Performed at [facility]   Report Status PENDING   Incomplete  CULTURE, BLOOD (ROUTINE X 2)     Status: None   Collection Time    07/19/13  1:32 PM      Result Value Range Status   Specimen Description BLOOD RIGHT ANTECUBITAL   Final   Special Requests BOTTLES DRAWN AEROBIC ONLY 2CC   Final   Culture  Setup Time     Final   Value: 07/19/2013 20:14     Performed at [facility]   Culture     Final   Value:        BLOOD CULTURE RECEIVED NO GROWTH TO DATE CULTURE WILL BE HELD FOR 5 DAYS BEFORE ISSUING A FINAL NEGATIVE REPORT     Performed at [facility]   Report Status PENDING   Incomplete  CULTURE, ROUTINE-ABSCESS     Status: None   Collection Time     07/19/13  4:06 PM      Result Value Range Status   Specimen Description DRAINAGE   Final   Special Requests Normal   Final   Gram Stain     Final   Value: NO WBC SEEN     NO SQUAMOUS EPITHELIAL CELLS SEEN     NO ORGANISMS SEEN     Performed at [facility]   Culture     Final   Value: NO GROWTH 2 DAYS     Performed at [facility]   Report Status PENDING   Incomplete  MRSA PCR SCREENING     Status: None   Collection Time    07/20/13  1:30 AM      Result Value Range Status   MRSA by PCR NEGATIVE  NEGATIVE Final   Comment:            The GeneXpert MRSA Assay (FDA     approved for NASAL specimens     only), is one component of a     comprehensive MRSA colonization     surveillance program. It is not     intended to diagnose MRSA     infection nor to guide or     monitor treatment for     MRSA infections.  CLOSTRIDIUM DIFFICILE BY PCR     Status: Abnormal   Collection Time    07/20/13  1:30 AM      Result Value Range Status   C difficile by pcr POSITIVE (*) NEGATIVE Final   Comment: CRITICAL RESULT CALLED TO, READ BACK BY AND VERIFIED WITH:     CARPENTER T.,RN 10.25.14 1145 BY JONESJ     Performed at [facility]   Assessment: It appears that she is improving on therapy for recurrent C. difficile colitis. She was C. difficile PCR when hospitalized in March of this year he notes indicate that she had C. difficile last year as well. I will plan on a six-week tapering course of oral vancomycin.  Plan:  1. Recommend tapering course of oral vancomycin as listed below:    125 mg orally four times daily for 7 days  125 mg orally twice daily for 7 days  125 mg orally once daily for 7 days  125 mg orally every other day for 7 days  Then, 125 mg orally every 3 days for 14 days  2.   discontinue fluconazole on discharge 3.   We will follow up on Wednesday, 10/29 if she is still here   Cliffton Asters, MD [facility] [phone] pager   [phone] cell 07/22/2013, 1:27 PM

## 2013-07-22 NOTE — Progress Notes (Signed)
Physical Therapy Treatment Patient Details Name: EASTON SIVERTSON MRN: [id number] DOB: 10/05/1929 Today's Date: 07/22/2013 Time: 4098-1191 PT Time Calculation (min): 19 min  PT Assessment / Plan / Recommendation  History of Present Illness The patient is a 79 y.o. year-old female with history of HTN/HLD, PAF not on A/C, chronic diastolic heart failure, temporal arteritis on chronic steroids, recurrent UTI, hx of c. Diff, dementia who presents with nausea, vomiting.  The patient was last at their baseline health a little over a month ago.  She was hospitalized at [facility] with abdominal pain and was found to a fluid collection around the lumbar spine which was sampled.  She was started on empiric antibiotics, however, her culture was negative and she was discharged to follow up with general surgery.  She has been feeling well and living at [facility] where she has been getting around with a rolling walker.  Approximately two days ago, she developed nausea with vomiting which was somewhat bilious and which occurred about 3-4 times per day.    PT Comments   *Pt progressing with mobility. Gait distance limited by incontinence of bowel. She walked 67' with RW and min/guard assist. **  Follow Up Recommendations  SNF     Does the patient have the potential to tolerate intense rehabilitation     Barriers to Discharge        Equipment Recommendations  None recommended by PT    Recommendations for Other Services    Frequency Min 3X/week   Progress towards PT Goals Progress towards PT goals: Progressing toward goals  Plan Current plan remains appropriate    Precautions / Restrictions Precautions Precautions: Fall Precaution Comments: contact for c-diff Restrictions Weight Bearing Restrictions: No   Pertinent Vitals/Pain *0/10 pain**    Mobility  Bed Mobility Bed Mobility: Rolling Left;Left Sidelying to Sit Rolling Left: With rail;6: Modified independent (Device/Increase  time) Left Sidelying to Sit: HOB elevated;6: Modified independent (Device/Increase time);With rails Transfers Sit to Stand: With upper extremity assist;From bed;4: Min guard Stand to Sit: With upper extremity assist;To chair/3-in-1;4: Min guard Details for Transfer Assistance: VCs for hand placement and safety Ambulation/Gait Ambulation/Gait Assistance: 4: Min guard Ambulation Distance (Feet): 50 Feet Assistive device: Rolling walker Ambulation/Gait Assistance Details: distance limited by incontinence of bowel Gait Pattern: Step-through pattern Gait velocity: WFL General Gait Details: VCs to lift head  Walked with 2L O2 which is her baseline.   Exercises     PT Diagnosis:    PT Problem List:   PT Treatment Interventions:     PT Goals (current goals can now be found in the care plan section) Acute Rehab PT Goals Patient Stated Goal: none stated PT Goal Formulation: With patient Time For Goal Achievement: 07/27/13 Potential to Achieve Goals: Good  Visit Information  Last PT Received On: 07/22/13 Assistance Needed: +1 History of Present Illness: The patient is a 79 y.o. year-old female with history of HTN/HLD, PAF not on A/C, chronic diastolic heart failure, temporal arteritis on chronic steroids, recurrent UTI, hx of c. Diff, dementia who presents with nausea, vomiting.  The patient was last at their baseline health a little over a month ago.  She was hospitalized at [facility] with abdominal pain and was found to a fluid collection around the lumbar spine which was sampled.  She was started on empiric antibiotics, however, her culture was negative and she was discharged to follow up with general surgery.  She has been feeling well and living at [facility] where she has been getting around with a rolling walker.  Approximately two days ago, she developed nausea with vomiting which was somewhat bilious and which occurred about 3-4 times per day.     Subjective Data  Patient  Stated Goal: none stated   Cognition  Cognition Arousal/Alertness: Awake/alert Behavior During Therapy: WFL for tasks assessed/performed Overall Cognitive Status: Within Functional Limits for tasks assessed    Balance  Static Sitting Balance Static Sitting - Balance Support: Bilateral upper extremity supported;Feet supported Static Sitting - Level of Assistance: 6: Modified independent (Device/Increase time)  End of Session PT - End of Session Equipment Utilized During Treatment: Gait belt;Oxygen Activity Tolerance: Treatment limited secondary to medical complications (Comment) (incontinence of bowel) Patient left: in chair;with call bell/phone within reach Nurse Communication: Mobility status   GP     Ralene Bathe Kistler 07/22/2013, 11:22 AM [phone]

## 2013-07-23 LAB — BASIC METABOLIC PANEL
BUN: 15 mg/dL (ref 6–23)
CO2: 26 mEq/L (ref 19–32)
Calcium: 8.9 mg/dL (ref 8.4–10.5)
Chloride: 106 mEq/L (ref 96–112)
GFR calc non Af Amer: 45 mL/min — ABNORMAL LOW (ref >90)
Glucose, Bld: 90 mg/dL (ref 70–99)
Potassium: 4.2 mEq/L (ref 3.5–5.1)

## 2013-07-23 LAB — CULTURE, ROUTINE-ABSCESS
Culture: NO GROWTH
Gram Stain: NONE SEEN

## 2013-07-23 MED ORDER — FLUCONAZOLE 200 MG PO TABS
200.0000 mg | ORAL_TABLET | Freq: Every day | ORAL | Status: DC
  Administered 2013-07-24: 200 mg via ORAL
  Filled 2013-07-23: qty 1

## 2013-07-23 NOTE — Progress Notes (Addendum)
Occupational Therapy Treatment Patient Details Name: [name] MRN: [id number] DOB: 1929/12/15 Today's Date: 07/23/2013 Time: 4098-1191 OT Time Calculation (min): 28 min  OT Assessment / Plan / Recommendation  History of present illness The patient is a 79 y.o. year-old female with history of HTN/HLD, PAF not on A/C, chronic diastolic heart failure, temporal arteritis on chronic steroids, recurrent UTI, hx of c. Diff, dementia who presents with nausea, vomiting.  The patient was last at their baseline health a little over a month ago.  She was hospitalized at [facility] with abdominal pain and was found to a fluid collection around the lumbar spine which was sampled.  She was started on empiric antibiotics, however, her culture was negative and she was discharged to follow up with general surgery.  She has been feeling well and living at [facility] where she has been getting around with a rolling walker.  Approximately two days ago, she developed nausea with vomiting which was somewhat bilious and which occurred about 3-4 times per day.    OT comments  Pt making progress to +1 assist for all aspects of toileting. She fatigues quickly. Able to transfer to [facility] for BM today.  Follow Up Recommendations  SNF;Supervision/Assistance - 24 hour--unless ALF able to provide current level of care. Pt requiring some assist with toileting (especially hygiene, clothing management) whereas per pt she was supervision PTA.  Per SW note, facility does appear to be able to take pt back at current level.   Barriers to Discharge       Equipment Recommendations  3 in 1 bedside comode    Recommendations for Other Services    Frequency Min 2X/week   Progress towards OT Goals Progress towards OT goals: Progressing toward goals  Plan Discharge plan remains appropriate    Precautions / Restrictions Precautions Precautions: Fall Restrictions Weight Bearing Restrictions: No   Pertinent Vitals/Pain  No complaint of    ADL  Grooming: Performed;Wash/dry face;Supervision/safety;Set up Where Assessed - Grooming: Unsupported sitting Toilet Transfer: Performed;Minimal assistance Toilet Transfer Method: Stand pivot Toilet Transfer Equipment: Bedside commode Toileting - Clothing Manipulation and Hygiene: Performed;+1 Total assistance Equipment Used: Rolling walker ADL Comments: Pt able to transfer onto [facility] before having BM. Stood for hygiene but currently needs assist for hygiene as she fatigues quickly and needs walker for support. Then pivot around to chair to sit up. Assisted with applying barrier cream also.     OT Diagnosis:    OT Problem List:   OT Treatment Interventions:     OT Goals(current goals can now be found in the care plan section)    Visit Information  Last OT Received On: 07/23/13 Assistance Needed: +1 History of Present Illness: The patient is a 79 y.o. year-old female with history of HTN/HLD, PAF not on A/C, chronic diastolic heart failure, temporal arteritis on chronic steroids, recurrent UTI, hx of c. Diff, dementia who presents with nausea, vomiting.  The patient was last at their baseline health a little over a month ago.  She was hospitalized at [facility] with abdominal pain and was found to a fluid collection around the lumbar spine which was sampled.  She was started on empiric antibiotics, however, her culture was negative and she was discharged to follow up with general surgery.  She has been feeling well and living at [facility] where she has been getting around with a rolling walker.  Approximately two days ago, she developed nausea with vomiting which was somewhat bilious  and which occurred about 3-4 times per day.     Subjective Data      Prior Functioning       Cognition  Cognition Arousal/Alertness: Awake/alert Behavior During Therapy: WFL for tasks assessed/performed Overall Cognitive Status: Within Functional Limits for tasks assessed     Mobility  Bed Mobility Bed Mobility: Supine to Sit Supine to Sit: 5: Supervision Transfers Transfers: Stand to Sit Sit to Stand: 4: Min assist;With upper extremity assist;From bed;From chair/3-in-1 Stand to Sit: 4: Min assist;With upper extremity assist;To chair/3-in-1 Details for Transfer Assistance: VCs for hand placement and safety    Exercises      Balance Static Standing Balance Static Standing - Balance Support: Bilateral upper extremity supported;During functional activity Static Standing - Level of Assistance: 4: Min assist   End of Session OT - End of Session Equipment Utilized During Treatment: Rolling walker Activity Tolerance: Patient tolerated treatment well Patient left: in chair;with call bell/phone within reach  GO     Lennox Laity [phone] 07/23/2013, 9:46 AM

## 2013-07-23 NOTE — Progress Notes (Addendum)
[facility] PROGRESS NOTE  [name] WGN:[id number] DOB: January 16, 1930 DOA: 07/19/2013 PCP: Cain Saupe, MD  Brief narrative: [name] is an 79 y.o. female with a PMH of hypertension, hyperlipidemia, paroxysmal atrial fibrillation (not on anticoagulation), chronic diastolic CHF, temporal arteritis on chronic steroids, recurrent UTI, history of C. Difficile, recent hospitalization 05/30/13-06/01/13 for treatment of paraspinal fluid collection (culture negative) and dementia who was admitted on 07/19/2013 with nausea and vomiting. CT scan of the abdomen and pelvis showed fluid collection posterior to L4 in the posterior soft tissues of the back (although smaller) with surrounding stranding in this area. Inflammatory etiology suspected.  Underwent percutaneous drainage on 07/19/2013.  Cultures negative to date and empiric antibiotics for this were discontinued/26/2014. The patient looks a little weaker today.  Assessment/Plan: Principal Problem:     C. Difficile Diarrhea complicated by acute renal failure/dehydration from frequent stools. -Started oral Vancomycin 07/20/13. WBC normalized. Will need prolonged taper given that this is a recurrence. -Stool output decreasing.   Active problems:    Abdominal pain, acute, left lower quadrant with recurrent fluid collection posterior to L4 -Previously hospitalized 05/30/13-06/01/2013. Underwent aspiration of fluid collection per IR with cultures of aspirated negative to date. ID recommended a two-week course of Ceftin/doxycycline which was completed 06/16/2003. -Underwent repeat percutaneous drainage per IR on 07/19/13.  Cultures negative to date. Empiric IV vancomycin/Zosyn discontinued by ID 07/21/2013. Clinical condition looks a little worse today, would watch for another 24 hours to ensure further empiric antibiotics are not needed. -Continue supportive care with Zofran, gentle hydration. -Blood cultures negative to date, C. difficile PCR +.  Continue oral vancomycin.   Acute renal failure in the setting of stage III chronic kidney disease -Creatinine back to baseline with IV fluids.   Thrush -Continue diflucan.   Generalized weakness -Status post PT/OT evaluations.  PT recommends SNF.   Hypothyroidism -Continue Synthroid.   Depression -Continue Zoloft.   Dementia -Continue Aricept.   Chronic anemia -Hemoglobin stable. Continue iron therapy.   Chronic Diastolic CHF -I and O. balance negative, hold Lasix. Chest x-ray negative for pulmonary edema.   Temporal arteritis / Chronic steroid use -Continue prednisone.   Hypertension -Continue Norvasc and hydralazine. Lasix on hold.   Leukocytosis.  -Likely from C. difficile. Resolved.   Nausea and vomiting -Continue Zofran as needed.   History of TIA -Continue Aggrenox.   GERD -Continue Protonix.   Hypokalemia -Resolved.  Code Status: DNR Family Communication: No family currently at the bedside.  [name] (daughter) updated by telephone 07/23/2013.   Disposition Plan: From [facility] with plans to return there at discharge.   Medical Consultants:  Dr. Simonne Come, Interventional radiology  Dr. Judyann Munson, Infectious Disease  Other Consultants:  Physical therapy  Occupational therapy  Anti-infectives:  Vancomycin 07/19/2013--->07/21/13  Diflucan 07/19/2013---> 07/23/2013  Rocephin 07/19/2013---> 07/19/2013  Flagyl 07/19/2013---> 07/19/2013  Zosyn 07/20/2013--->07/21/13  Oral Vancomycin 07/20/13--->   HPI/Subjective: [name] feels tired this morning. Appetite remains good. Only 2 documented stools over the past 24 hours, down from 8 stools the day before. No nausea or vomiting. No complaints of pain. Nursing tech reports that the patient's bottom is excoriated.  Objective: Filed Vitals:   07/21/13 2059 07/22/13 0526 07/22/13 1345 07/22/13 1958  BP: 108/47 153/47 127/58 140/55  Pulse: 50 73 55 59  Temp: 98 F (36.7 C)  98.2 F (36.8 C) 97.8 F (36.6 C) 98.1 F (36.7 C)  TempSrc: Oral Oral Oral Oral  Resp: 20 20 16  18  Height:      Weight:      SpO2: 98% 98% 98% 99%    Intake/Output Summary (Last 24 hours) at 07/23/13 0715 Last data filed at 07/23/13 0551  Gross per 24 hour  Intake   1240 ml  Output   3550 ml  Net  -2310 ml    Exam: Gen:  NAD Cardiovascular:  RRR, No M/R/G Respiratory:  Lungs CTAB Gastrointestinal:  Abdomen soft, NT/ND, + BS Extremities:  No C/E/C  Data Reviewed: Basic Metabolic Panel:  Recent Labs Lab 07/19/13 0359 07/20/13 0715 07/21/13 0404 07/22/13 0415 07/23/13 0345  NA 136 137 135 140 139  K 3.5 3.4* 3.4* 4.5 4.2  CL 97 101 101 106 106  CO2 27 25 26 27 26   GLUCOSE 113* 100* 98 96 90  BUN 20 12 14 13 15   CREATININE 1.32* 1.15* 1.42* 1.23* 1.11*  CALCIUM 9.4 8.9 8.9 8.8 8.9  MG 1.9  --   --   --   --    GFR Estimated Creatinine Clearance: 49.6 ml/min (by C-G formula based on Cr of 1.11). Liver Function Tests:  Recent Labs Lab 07/19/13 0359 07/20/13 0715  AST 16 18  ALT 15 11  ALKPHOS 80 86  BILITOT 0.2* 0.4  PROT 7.1 6.2  ALBUMIN 3.1* 2.5*   CBC:  Recent Labs Lab 07/19/13 0359 07/20/13 0715 07/21/13 0404 07/22/13 0415  WBC 27.0* 8.3 10.6* 5.9  NEUTROABS 24.0*  --   --   --   HGB 12.4 11.8* 11.3* 10.6*  HCT 39.3 37.4 35.1* 33.7*  MCV 88.1 88.8 88.4 88.9  PLT 238 210 222 221   BNP (last 3 results)  Recent Labs  10/08/12 1723 10/12/12 0530 01/23/13 1640  PROBNP 1356.0* 8213.0* 1346.0*   Microbiology Recent Results (from the past 240 hour(s))  CULTURE, BLOOD (ROUTINE X 2)     Status: None   Collection Time    07/19/13  1:19 PM      Result Value Range Status   Specimen Description BLOOD LEFT ANTECUBITAL   Final   Special Requests BOTTLES DRAWN AEROBIC AND ANAEROBIC 3CC AND 2CC   Final   Culture  Setup Time     Final   Value: 07/19/2013 20:15     Performed at [facility]   Culture     Final   Value:        BLOOD  CULTURE RECEIVED NO GROWTH TO DATE CULTURE WILL BE HELD FOR 5 DAYS BEFORE ISSUING A FINAL NEGATIVE REPORT     Performed at [facility]   Report Status PENDING   Incomplete  CULTURE, BLOOD (ROUTINE X 2)     Status: None   Collection Time    07/19/13  1:32 PM      Result Value Range Status   Specimen Description BLOOD RIGHT ANTECUBITAL   Final   Special Requests BOTTLES DRAWN AEROBIC ONLY 2CC   Final   Culture  Setup Time     Final   Value: 07/19/2013 20:14     Performed at [facility]   Culture     Final   Value:        BLOOD CULTURE RECEIVED NO GROWTH TO DATE CULTURE WILL BE HELD FOR 5 DAYS BEFORE ISSUING A FINAL NEGATIVE REPORT     Performed at [facility]   Report Status PENDING   Incomplete  CULTURE, ROUTINE-ABSCESS     Status: None   Collection Time  07/19/13  4:06 PM      Result Value Range Status   Specimen Description DRAINAGE   Final   Special Requests Normal   Final   Gram Stain     Final   Value: NO WBC SEEN     NO SQUAMOUS EPITHELIAL CELLS SEEN     NO ORGANISMS SEEN     Performed at [facility]   Culture     Final   Value: NO GROWTH 2 DAYS     Performed at [facility]   Report Status PENDING   Incomplete  MRSA PCR SCREENING     Status: None   Collection Time    07/20/13  1:30 AM      Result Value Range Status   MRSA by PCR NEGATIVE  NEGATIVE Final   Comment:            The GeneXpert MRSA Assay (FDA     approved for NASAL specimens     only), is one component of a     comprehensive MRSA colonization     surveillance program. It is not     intended to diagnose MRSA     infection nor to guide or     monitor treatment for     MRSA infections.  CLOSTRIDIUM DIFFICILE BY PCR     Status: Abnormal   Collection Time    07/20/13  1:30 AM      Result Value Range Status   C difficile by pcr POSITIVE (*) NEGATIVE Final   Comment: CRITICAL RESULT CALLED TO, READ BACK BY AND VERIFIED WITH:     CARPENTER T.,RN 10.25.14 [id number]  BY JONESJ     Performed at [facility]     Procedures and Diagnostic Studies: Ct Abdomen Pelvis Wo Contrast  07/19/2013   CLINICAL DATA:  Lower abdominal pain with nausea and vomiting  EXAM: CT ABDOMEN AND PELVIS WITHOUT CONTRAST  TECHNIQUE: Multidetector CT imaging of the abdomen and pelvis was performed following the standard protocol without oral or intravenous contrast.  COMPARISON:  May 30, 2013  FINDINGS: There is scarring in the lung bases bilaterally. There is mild lower lobe bronchiectatic change. Heart is mildly enlarged with scattered foci of coronary artery calcification.  The no focal liver lesions are identified on this non contrast enhanced study. Gallbladder is absent. There is no appreciable biliary duct dilatation.  Spleen appears normal. There is some fatty replacement in the pancreas. There is no pancreatic mass or inflammatory focus. Adrenals appear normal.  Kidneys bilaterally show no appreciable mass, calculus, or hydronephrosis on either side. There is no ureteral calculus or ureterectasis on either side.  There is again noted fluid in the soft tissues posterior to the level of L4 on the left. This fluid collection is smaller than on the previous study. It currently measures 5.3 x 5.7 x 3.3 cm. There is some mild stranding in the fat adjacent to this fluid collection the.  In the pelvis, there are several small urinary bladder diverticula. There is no pelvic mass or fluid collection. The uterus and appendix are absent.  There is no bowel obstruction. No free air or portal venous air.  There is a small ventral hernia which contains bowel but no apparent bowel compromise.  There is no ascites, adenopathy, or abscess in the abdomen or pelvis. There is no demonstrable diverticulitis. The periappendiceal region appears unremarkable.  There is atherosclerotic change in the aorta but no aneurysm. There is degenerative change  in the lumbar spine. There are no blastic or lytic  bone lesions.  IMPRESSION: Fluid collection posterior to L4 in the posterior soft tissues of the back is again noted although smaller. There is surrounding stranding in this area. Inflammatory etiology is suspected.  There are multiple urinary bladder diverticula without apparent inflammation.  The gallbladder, uterus, and appendix are absent.  No bowel obstruction. No diverticulitis. No abscess.   Electronically Signed   By: Bretta Bang M.D.   On: 07/19/2013 09:20   Dg Chest 2 View  07/19/2013   CLINICAL DATA:  leukocytosis and nausea  EXAM: CHEST  2 VIEW  COMPARISON:  January 23, 2013  FINDINGS: There is no edema or consolidation. Heart is mildly enlarged with normal pulmonary vascularity. No adenopathy. There is atherosclerotic change in the aorta.  IMPRESSION: The mild cardiac enlargement. No edema or consolidation.   Electronically Signed   By: Bretta Bang M.D.   On: 07/19/2013 09:13   US Aspiration  07/19/2013   INDICATION: Indeterminate recurrent fluid collection within the subcutaneous tissues of the left lower back.  EXAM: ULTRASOUND GUIDED SOFT TISSUE FLUID COLLECTION ASPIRATION  MEDICATIONS: None  TECHNIQUE: Informed written consent was obtained from the patient after a discussion of the risks, benefits and alternatives to treatment. Questions regarding the procedure were encouraged and answered. A timeout was performed prior to the initiation of the procedure.  The patient was positioned right lateral decubitus in her bed (patient is unable to lie prone). Preprocedural ultrasound scanning in demonstrates a small easily compressible fluid collection within the left side of the midline of the lower back which correlates with the recurrent fluid collection seen on preceding abdominal CT.  The left lower back was prepped and draped in the usual sterile fashion, and a sterile drape was applied covering the operative field. Maximum barrier sterile technique with sterile gowns and gloves were  used for the procedure. A timeout was performed prior to the initiation of the procedure.  Under direct ultrasound guidance, a 5-French multi side-hole Yueh sheath needle was utilized to access the fluid collection after the overlying soft tissues was anesthetized with 1% lidocaine with epinephrine. A total of approximately 20 ml of clear, straw-colored fluid was aspirated.  Postprocedural ultrasound imaging demonstrated near complete resolution of the fluid collection. The patient tolerated the procedure well without immediate postprocedural complication.  ANESTHESIA/SEDATION: None  COMPARISON:  Ultrasound-guided fluid collection -05/30/2013 ; CT abdomen and pelvis - earlier same day  COMPLICATIONS: None immediate  FINDINGS: Ultrasound scanning demonstrates and easily compressible anechoic fluid collection within the midline of the left lower back which correlates with the findings on preprocedural abdominal CT. Under direct ultrasound guidance, approximately 20 cc of serous fluid was aspirated. The aspirated samples was sent to the laboratory for analysis.  IMPRESSION: Technically successful aspiration of indeterminate subcutaneous fluid collection within in the midline of the left lower back yielding approximately 20 cc of serous fluid. The aspirated sample was sent to the laboratory for analysis.   Electronically Signed   By: Simonne Come M.D.   On: 07/19/2013 17:43    Scheduled Meds: . amLODipine  5 mg Oral q morning - 10a  . dipyridamole-aspirin  1 capsule Oral BID  . donepezil  10 mg Oral QHS  . enoxaparin (LOVENOX) injection  40 mg Subcutaneous Q24H  . ferrous sulfate  325 mg Oral Q breakfast  . fluconazole (DIFLUCAN) IV  200 mg Intravenous Q24H  . furosemide  40 mg Oral q morning -  10a  . hydrALAZINE  10 mg Oral Q8H  . levothyroxine  150 mcg Oral QAC breakfast  . nystatin   Topical BID  . pantoprazole  40 mg Oral Daily  . potassium chloride  20 mEq Oral BID  . predniSONE  10 mg Oral QODAY  .  predniSONE  15 mg Oral QODAY  . saccharomyces boulardii  250 mg Oral BID  . sertraline  50 mg Oral q morning - 10a  . vancomycin  125 mg Oral QID   Followed by  . [START ON 07/28/2013] vancomycin  125 mg Oral BID   Followed by  . [START ON 08/05/2013] vancomycin  125 mg Oral Daily   Followed by  . [START ON 08/12/2013] vancomycin  125 mg Oral QODAY   Followed by  . [START ON 08/20/2013] vancomycin  125 mg Oral Q3 days   Continuous Infusions: . 0.9 % NaCl with KCl 20 mEq / L 100 mL/hr at 07/23/13 0559    Time spent: 25 minutes.    LOS: 4 days   Ketzia Guzek  [facility] Pager [phone].   *Please note that the hospitalists switch teams on Wednesdays. Please call the flow manager at [phone] if you are having difficulty reaching the hospitalist taking care of this patient as she can update you and provide the most up-to-date pager number of provider caring for the patient. If 8PM-8AM, please contact night-coverage at [email or web address], password [facility]  07/23/2013, 7:15 AM

## 2013-07-23 NOTE — Progress Notes (Signed)
CSW continuing to follow for pt return to [facility].  Per MD, pt not yet medically ready for discharge.  CSW contacted [facility] to update and facility states that they can accept pt back when pt medically ready.  CSW to continue to follow and facilitate pt discharge needs when pt medically ready for discharge.  Jacklynn Lewis, MSW, LCSWA  Clinical Social Work [phone]

## 2013-07-24 DIAGNOSIS — R197 Diarrhea, unspecified: Secondary | ICD-10-CM

## 2013-07-24 DIAGNOSIS — R609 Edema, unspecified: Secondary | ICD-10-CM

## 2013-07-24 LAB — CBC
MCH: 27.7 pg (ref 26.0–34.0)
MCHC: 31.2 g/dL (ref 30.0–36.0)
MCV: 88.9 fL (ref 78.0–100.0)
Platelets: 216 10*3/uL (ref 150–400)
RBC: 3.61 MIL/uL — ABNORMAL LOW (ref 3.87–5.11)

## 2013-07-24 LAB — BASIC METABOLIC PANEL
BUN: 18 mg/dL (ref 6–23)
CO2: 26 mEq/L (ref 19–32)
Calcium: 8.8 mg/dL (ref 8.4–10.5)
Creatinine, Ser: 1.12 mg/dL — ABNORMAL HIGH (ref 0.50–1.10)
GFR calc Af Amer: 52 mL/min — ABNORMAL LOW (ref >90)
GFR calc non Af Amer: 45 mL/min — ABNORMAL LOW (ref >90)
Glucose, Bld: 108 mg/dL — ABNORMAL HIGH (ref 70–99)
Sodium: 139 mEq/L (ref 135–145)

## 2013-07-24 MED ORDER — VANCOMYCIN 50 MG/ML ORAL SOLUTION: ORAL | Status: AC

## 2013-07-24 MED ORDER — ONDANSETRON HCL 4 MG PO TABS: 4.0000 mg | ORAL_TABLET | Freq: Four times a day (QID) | ORAL | Status: DC | PRN

## 2013-07-24 MED ORDER — VANCOMYCIN 50 MG/ML ORAL SOLUTION
125.0000 mg | Freq: Two times a day (BID) | ORAL | Status: AC
Start: 2013-08-07 — End: 2013-08-14

## 2013-07-24 MED ORDER — VANCOMYCIN 50 MG/ML ORAL SOLUTION: 125.0000 mg | ORAL | Status: AC

## 2013-07-24 MED ORDER — VANCOMYCIN 50 MG/ML ORAL SOLUTION: 125.0000 mg | Freq: Every day | ORAL | Status: AC

## 2013-07-24 NOTE — Discharge Summary (Signed)
Physician Discharge Summary  GRASIELA JONSSON ZOX:[id number] DOB: Dec 20, 1929 DOA: 07/19/2013  PCP: No primary provider on file.  Admit date: 07/19/2013 Discharge date: 07/24/2013  Time spent: 53 minutes  Recommendations for Outpatient Follow-up:  1. Please followup on a BMP and CBC within one week, patient diagnosed with C. difficile colitis, acute renal failure and dehydration.  Discharge Diagnoses:  Principal Problem:   Abdominal pain, acute, left lower quadrant Active Problems:   Generalized weakness   Hypothyroidism   Depression   Dementia   Chronic steroid use   Chronic anemia   Diastolic CHF   Temporal arteritis   Hypertension   Leukocytosis   Steroid long-term use   Nausea and vomiting   CKD (chronic kidney disease) stage 3, GFR 30-59 ml/min   Discharge Condition: Stable/improved  Diet recommendation: Heart healthy diet  Filed Weights   07/19/13 1300 07/20/13 0257  Weight: 93.7 kg (206 lb 9.1 oz) 93.2 kg (205 lb 7.5 oz)    History of present illness:  The patient is a 79 y.o. year-old female with history of HTN/HLD, PAF not on A/C, chronic diastolic heart failure, temporal arteritis on chronic steroids, recurrent UTI, hx of c. Diff, dementia who presents with nausea, vomiting. The patient was last at their baseline health a little over a month ago. She was hospitalized at [facility] with abdominal pain and was found to a fluid collection around the lumbar spine which was sampled. She was started on empiric antibiotics, however, her culture was negative and she was discharged to follow up with general surgery. She has been feeling well and living at [facility] where she has been getting around with a rolling walker. Approximately two days ago, she developed nausea with vomiting which was somewhat bilious and which occurred about 3-4 times per day. She also had some soft stools which she does not characterize as diarrhea, but which have occurred 2-3 times per  day. She has not been able to eat or drink very well. Denies abdominal pain, fevers, and chills. She has had some increased SOB without wheezing, chest tightness, or cough. She denies dysuria, but has had urgency and frequency of urination.     Hospital Course:  KEENAN DIMITROV is an 79 y.o. female with a PMH of hypertension, hyperlipidemia, paroxysmal atrial fibrillation (not on anticoagulation), chronic diastolic CHF, temporal arteritis on chronic steroids, recurrent UTI, history of C. Difficile, recent hospitalization 05/30/13-06/01/13 for treatment of paraspinal fluid collection (culture negative) and dementia who was admitted on 07/19/2013 with nausea and vomiting. CT scan of the abdomen and pelvis showed fluid collection posterior to L4 in the posterior soft tissues of the back (although smaller) with surrounding stranding in this area. Inflammatory etiology suspected. Underwent percutaneous drainage on 07/19/2013. Cultures negative to date and empiric antibiotics for this were discontinued/26/2014. By 07/24/2013 she reported feeling better. Her stools were becoming formed, and she was tolerating by mouth intake. She stated feeling well enough to go back to skilled nursing facility. Her lab work was reviewed, white count remaining within normal limits at 7800. Her kidney function has been stable, had a BUN and creatinine of 18 and 1.12. Given clinical stability, she was transferred back to her skilled nursing facility on 07/24/2013 on Vancomycin taper.   Antibiotics received during this admission Vancomycin 07/19/2013--->07/21/13  Diflucan 07/19/2013---> 07/23/2013  Rocephin 07/19/2013---> 07/19/2013  Flagyl 07/19/2013---> 07/19/2013  Zosyn 07/20/2013--->07/21/13  Oral Vancomycin 07/20/13--->   Discharge Exam: Filed Vitals:   07/24/13 0647  BP: 161/58  Pulse: 58  Temp: 98 F (36.7 C)  Resp: 16    General: Patient is in no acute distress awake alert oriented, reports feeling better.  States her stools are becoming formed. Cardiovascular: Regular rate rhythm normal S1-S2 Respiratory: Lungs are clear to auscultation bilaterally Abdomen: Soft nontender nondistended positive bowel sound Extremities: No  Discharge Instructions      Discharge Orders   Future Appointments Provider Department Dept Phone   10/21/2013 1:30 PM Levert Feinstein, MD [facility] [phone]   Future Orders Complete By Expires   Call MD for:  extreme fatigue  As directed    Call MD for:  persistant dizziness or light-headedness  As directed    Call MD for:  persistant nausea and vomiting  As directed    Call MD for:  severe uncontrolled pain  As directed    Call MD for:  temperature >100.4  As directed    Diet - low sodium heart healthy  As directed    Discharge instructions  As directed    Comments:     Please follow up with your family doctor in 1 week   Increase activity slowly  As directed        Medication List    STOP taking these medications       cephALEXin 250 MG capsule  Commonly known as:  KEFLEX     Fiber Caps     fluconazole 100 MG tablet  Commonly known as:  DIFLUCAN     furosemide 40 MG tablet  Commonly known as:  LASIX     guaiFENesin 100 MG/5ML Soln  Commonly known as:  ROBITUSSIN     loperamide 2 MG capsule  Commonly known as:  IMODIUM      TAKE these medications       acetaminophen 500 MG tablet  Commonly known as:  TYLENOL  Take 500 mg by mouth every 6 (six) hours as needed for pain.     amLODipine 5 MG tablet  Commonly known as:  NORVASC  Take 5 mg by mouth every morning.     BETA CAROTENE PO  Take 1 capsule by mouth daily.     Cranberry 250 MG Caps  Take 500 mg by mouth 2 (two) times daily.     dipyridamole-aspirin 200-25 MG per 12 hr capsule  Commonly known as:  AGGRENOX  Take 1 capsule by mouth 2 (two) times daily.     donepezil 10 MG tablet  Commonly known as:  ARICEPT  Take 10 mg by mouth at bedtime.     ferrous  sulfate 325 (65 FE) MG tablet  Take 325 mg by mouth daily with breakfast.     gatifloxacin 0.5 % Soln  Commonly known as:  ZYMAXID  Place 1 drop into the left eye 4 (four) times daily.     hydrALAZINE 10 MG tablet  Commonly known as:  APRESOLINE  Take 1 tablet (10 mg total) by mouth every 8 (eight) hours.     levothyroxine 150 MCG tablet  Commonly known as:  SYNTHROID, LEVOTHROID  Take 150 mcg by mouth daily before breakfast.     omeprazole 20 MG capsule  Commonly known as:  PRILOSEC  Take 20 mg by mouth every morning.     ondansetron 4 MG tablet  Commonly known as:  ZOFRAN  Take 1 tablet (4 mg total) by mouth every 6 (six) hours as needed for nausea.     potassium chloride SA 20 MEQ tablet  Commonly known as:  K-DUR,KLOR-CON  Take 20 mEq by mouth 2 (two) times daily.     predniSONE 10 MG tablet  Commonly known as:  DELTASONE  Take 10 mg by mouth every other day. Alternating with 15mg  dose     predniSONE 5 MG tablet  Commonly known as:  DELTASONE  Take 15 mg by mouth every other day. Alternating with 10mg  dose     saccharomyces boulardii 250 MG capsule  Commonly known as:  FLORASTOR  Take 250 mg by mouth 2 (two) times daily.     sertraline 50 MG tablet  Commonly known as:  ZOLOFT  Take 50 mg by mouth every morning.     vancomycin 50 mg/mL oral solution  Commonly known as:  VANCOCIN  1) Please give 125mg  PO four times daily for 2 weeks     vancomycin 50 mg/mL oral solution  Commonly known as:  VANCOCIN  Take 2.5 mLs (125 mg total) by mouth 2 (two) times daily.  Start taking on:  08/07/2013     vancomycin 50 mg/mL oral solution  Commonly known as:  VANCOCIN  Take 2.5 mLs (125 mg total) by mouth daily.  Start taking on:  08/14/2013     vancomycin 50 mg/mL oral solution  Commonly known as:  VANCOCIN  Take 2.5 mLs (125 mg total) by mouth every other day.  Start taking on:  08/21/2013     vancomycin 50 mg/mL oral solution  Commonly known as:  VANCOCIN  Take  2.5 mLs (125 mg total) by mouth every 3 (three) days.  Start taking on:  08/28/2013       Allergies  Allergen Reactions  . Apap [Acetaminophen]     Reaction unknown  . Ceftriaxone Itching  . Ciprofloxacin Nausea And Vomiting  . Codeine Other (See Comments)    halucinations  . Demerol Other (See Comments)    halucinations  . Metronidazole Itching  . Morphine And Related Other (See Comments)    halucinations  . Nitrofurantoin Monohyd Macro Other (See Comments)    Per MAR  . Pregabalin Other (See Comments)    unknown  . Septra [Bactrim] Other (See Comments)    Unknown cant tolerate DS      The results of significant diagnostics from this hospitalization (including imaging, microbiology, ancillary and laboratory) are listed below for reference.    Significant Diagnostic Studies: Ct Abdomen Pelvis Wo Contrast  07/19/2013   CLINICAL DATA:  Lower abdominal pain with nausea and vomiting  EXAM: CT ABDOMEN AND PELVIS WITHOUT CONTRAST  TECHNIQUE: Multidetector CT imaging of the abdomen and pelvis was performed following the standard protocol without oral or intravenous contrast.  COMPARISON:  May 30, 2013  FINDINGS: There is scarring in the lung bases bilaterally. There is mild lower lobe bronchiectatic change. Heart is mildly enlarged with scattered foci of coronary artery calcification.  The no focal liver lesions are identified on this non contrast enhanced study. Gallbladder is absent. There is no appreciable biliary duct dilatation.  Spleen appears normal. There is some fatty replacement in the pancreas. There is no pancreatic mass or inflammatory focus. Adrenals appear normal.  Kidneys bilaterally show no appreciable mass, calculus, or hydronephrosis on either side. There is no ureteral calculus or ureterectasis on either side.  There is again noted fluid in the soft tissues posterior to the level of L4 on the left. This fluid collection is smaller than on the previous study. It  currently measures 5.3 x 5.7 x 3.3 cm.  There is some mild stranding in the fat adjacent to this fluid collection the.  In the pelvis, there are several small urinary bladder diverticula. There is no pelvic mass or fluid collection. The uterus and appendix are absent.  There is no bowel obstruction. No free air or portal venous air.  There is a small ventral hernia which contains bowel but no apparent bowel compromise.  There is no ascites, adenopathy, or abscess in the abdomen or pelvis. There is no demonstrable diverticulitis. The periappendiceal region appears unremarkable.  There is atherosclerotic change in the aorta but no aneurysm. There is degenerative change in the lumbar spine. There are no blastic or lytic bone lesions.  IMPRESSION: Fluid collection posterior to L4 in the posterior soft tissues of the back is again noted although smaller. There is surrounding stranding in this area. Inflammatory etiology is suspected.  There are multiple urinary bladder diverticula without apparent inflammation.  The gallbladder, uterus, and appendix are absent.  No bowel obstruction. No diverticulitis. No abscess.   Electronically Signed   By: Bretta Bang M.D.   On: 07/19/2013 09:20   Dg Chest 2 View  07/19/2013   CLINICAL DATA:  leukocytosis and nausea  EXAM: CHEST  2 VIEW  COMPARISON:  January 23, 2013  FINDINGS: There is no edema or consolidation. Heart is mildly enlarged with normal pulmonary vascularity. No adenopathy. There is atherosclerotic change in the aorta.  IMPRESSION: The mild cardiac enlargement. No edema or consolidation.   Electronically Signed   By: Bretta Bang M.D.   On: 07/19/2013 09:13   US Aspiration  07/19/2013   INDICATION: Indeterminate recurrent fluid collection within the subcutaneous tissues of the left lower back.  EXAM: ULTRASOUND GUIDED SOFT TISSUE FLUID COLLECTION ASPIRATION  MEDICATIONS: None  TECHNIQUE: Informed written consent was obtained from the patient after a  discussion of the risks, benefits and alternatives to treatment. Questions regarding the procedure were encouraged and answered. A timeout was performed prior to the initiation of the procedure.  The patient was positioned right lateral decubitus in her bed (patient is unable to lie prone). Preprocedural ultrasound scanning in demonstrates a small easily compressible fluid collection within the left side of the midline of the lower back which correlates with the recurrent fluid collection seen on preceding abdominal CT.  The left lower back was prepped and draped in the usual sterile fashion, and a sterile drape was applied covering the operative field. Maximum barrier sterile technique with sterile gowns and gloves were used for the procedure. A timeout was performed prior to the initiation of the procedure.  Under direct ultrasound guidance, a 5-French multi side-hole Yueh sheath needle was utilized to access the fluid collection after the overlying soft tissues was anesthetized with 1% lidocaine with epinephrine. A total of approximately 20 ml of clear, straw-colored fluid was aspirated.  Postprocedural ultrasound imaging demonstrated near complete resolution of the fluid collection. The patient tolerated the procedure well without immediate postprocedural complication.  ANESTHESIA/SEDATION: None  COMPARISON:  Ultrasound-guided fluid collection -05/30/2013 ; CT abdomen and pelvis - earlier same day  COMPLICATIONS: None immediate  FINDINGS: Ultrasound scanning demonstrates and easily compressible anechoic fluid collection within the midline of the left lower back which correlates with the findings on preprocedural abdominal CT. Under direct ultrasound guidance, approximately 20 cc of serous fluid was aspirated. The aspirated samples was sent to the laboratory for analysis.  IMPRESSION: Technically successful aspiration of indeterminate subcutaneous fluid collection within in the midline of the left lower  back  yielding approximately 20 cc of serous fluid. The aspirated sample was sent to the laboratory for analysis.   Electronically Signed   By: Simonne Come M.D.   On: 07/19/2013 17:43    Microbiology: Recent Results (from the past 240 hour(s))  CULTURE, BLOOD (ROUTINE X 2)     Status: None   Collection Time    07/19/13  1:19 PM      Result Value Range Status   Specimen Description BLOOD LEFT ANTECUBITAL   Final   Special Requests BOTTLES DRAWN AEROBIC AND ANAEROBIC 3CC AND 2CC   Final   Culture  Setup Time     Final   Value: 07/19/2013 20:15     Performed at [facility]   Culture     Final   Value:        BLOOD CULTURE RECEIVED NO GROWTH TO DATE CULTURE WILL BE HELD FOR 5 DAYS BEFORE ISSUING A FINAL NEGATIVE REPORT     Performed at [facility]   Report Status PENDING   Incomplete  CULTURE, BLOOD (ROUTINE X 2)     Status: None   Collection Time    07/19/13  1:32 PM      Result Value Range Status   Specimen Description BLOOD RIGHT ANTECUBITAL   Final   Special Requests BOTTLES DRAWN AEROBIC ONLY 2CC   Final   Culture  Setup Time     Final   Value: 07/19/2013 20:14     Performed at [facility]   Culture     Final   Value:        BLOOD CULTURE RECEIVED NO GROWTH TO DATE CULTURE WILL BE HELD FOR 5 DAYS BEFORE ISSUING A FINAL NEGATIVE REPORT     Performed at [facility]   Report Status PENDING   Incomplete  CULTURE, ROUTINE-ABSCESS     Status: None   Collection Time    07/19/13  4:06 PM      Result Value Range Status   Specimen Description DRAINAGE   Final   Special Requests Normal   Final   Gram Stain     Final   Value: NO WBC SEEN     NO SQUAMOUS EPITHELIAL CELLS SEEN     NO ORGANISMS SEEN     Performed at [facility]   Culture     Final   Value: NO GROWTH 3 DAYS     Performed at [facility]   Report Status 07/23/2013 FINAL   Final  MRSA PCR SCREENING     Status: None   Collection Time    07/20/13  1:30 AM      Result Value  Range Status   MRSA by PCR NEGATIVE  NEGATIVE Final   Comment:            The GeneXpert MRSA Assay (FDA     approved for NASAL specimens     only), is one component of a     comprehensive MRSA colonization     surveillance program. It is not     intended to diagnose MRSA     infection nor to guide or     monitor treatment for     MRSA infections.  CLOSTRIDIUM DIFFICILE BY PCR     Status: Abnormal   Collection Time    07/20/13  1:30 AM      Result Value Range Status   C difficile by pcr POSITIVE (*) NEGATIVE Final  Comment: CRITICAL RESULT CALLED TO, READ BACK BY AND VERIFIED WITH:     CARPENTER T.,RN 10.25.14 1145 BY JONESJ     Performed at [facility]     Labs: Basic Metabolic Panel:  Recent Labs Lab 07/19/13 0359 07/20/13 0715 07/21/13 0404 07/22/13 0415 07/23/13 0345 07/24/13 0338  NA 136 137 135 140 139 139  K 3.5 3.4* 3.4* 4.5 4.2 4.1  CL 97 101 101 106 106 106  CO2 27 25 26 27 26 26   GLUCOSE 113* 100* 98 96 90 108*  BUN 20 12 14 13 15 18   CREATININE 1.32* 1.15* 1.42* 1.23* 1.11* 1.12*  CALCIUM 9.4 8.9 8.9 8.8 8.9 8.8  MG 1.9  --   --   --   --   --    Liver Function Tests:  Recent Labs Lab 07/19/13 0359 07/20/13 0715  AST 16 18  ALT 15 11  ALKPHOS 80 86  BILITOT 0.2* 0.4  PROT 7.1 6.2  ALBUMIN 3.1* 2.5*   No results found for this basename: LIPASE, AMYLASE,  in the last 168 hours No results found for this basename: AMMONIA,  in the last 168 hours CBC:  Recent Labs Lab 07/19/13 0359 07/20/13 0715 07/21/13 0404 07/22/13 0415 07/24/13 0338  WBC 27.0* 8.3 10.6* 5.9 7.8  NEUTROABS 24.0*  --   --   --   --   HGB 12.4 11.8* 11.3* 10.6* 10.0*  HCT 39.3 37.4 35.1* 33.7* 32.1*  MCV 88.1 88.8 88.4 88.9 88.9  PLT 238 210 222 221 216   Cardiac Enzymes: No results found for this basename: CKTOTAL, CKMB, CKMBINDEX, TROPONINI,  in the last 168 hours BNP: BNP (last 3 results)  Recent Labs  10/08/12 1723 10/12/12 0530 01/23/13 1640   PROBNP 1356.0* 8213.0* 1346.0*   CBG: No results found for this basename: GLUCAP,  in the last 168 hours     Signed:  Jeralyn Bennett  [facility] 07/24/2013, 12:00 PM

## 2013-07-24 NOTE — Progress Notes (Signed)
Pt returned to [facility] today via family transport.  Cori Razor LCSW [phone]

## 2013-07-24 NOTE — Progress Notes (Signed)
Patient was stable at time of discharge. Reported off to [facility] fund[facility] at [facility]. Foley and IV was removed. Reviewed discharge education with patient and family; specifically Vancomycin regimen. Patient and family verbalized understanding.

## 2013-07-25 LAB — CULTURE, BLOOD (ROUTINE X 2): Culture: NO GROWTH

## 2013-07-28 LAB — STOOL CULTURE: Special Requests: NORMAL

## 2013-08-01 ENCOUNTER — Other Ambulatory Visit: Payer: Self-pay

## 2013-10-21 ENCOUNTER — Ambulatory Visit (INDEPENDENT_AMBULATORY_CARE_PROVIDER_SITE_OTHER): Payer: Medicare Other | Admitting: Neurology

## 2013-10-21 ENCOUNTER — Encounter: Payer: Self-pay | Admitting: Neurology

## 2013-10-21 VITALS — BP 146/68 | HR 66

## 2013-10-21 DIAGNOSIS — G629 Polyneuropathy, unspecified: Secondary | ICD-10-CM

## 2013-10-21 DIAGNOSIS — R0902 Hypoxemia: Secondary | ICD-10-CM

## 2013-10-21 DIAGNOSIS — G609 Hereditary and idiopathic neuropathy, unspecified: Secondary | ICD-10-CM

## 2013-10-21 DIAGNOSIS — I4891 Unspecified atrial fibrillation: Secondary | ICD-10-CM

## 2013-10-21 DIAGNOSIS — IMO0002 Reserved for concepts with insufficient information to code with codable children: Secondary | ICD-10-CM

## 2013-10-21 NOTE — Progress Notes (Signed)
Subjective:    Patient ID: [name] is a 80 y.o. female.   [name] is a 80 year old right-handed woman who presents for followup consultation of her multiple neurological issues including normal pressure hydrocephalus, giant cell arteritis, history of TIA and stroke, confusion and memory loss. She is accompanied by her daughter, Langley Gauss, today.  She was seen by Dr. Rexene Alberts in May 2014.  She previously followed with Dr. Morene Antu and was last seen by him on 10/02/2012, at which time he changed her prednisone regimen to 15 mg alternating with 10 mg every other day.   Her falls assessment we'll score at the time is 24. He also ordered a CBC and CMP as well as ESR. He called them back with the blood results, and while the CMP and CBC were essentially unremarkable, her ESR was 60, which is not unusual for her. She has a complex medical history including hypertension, colon cancer, hyperlipidemia, stroke, thyroid disease, depression, anxiety, giant cell arteritis, peripheral neuropathy, TIA, peripheral vascular disease, recurrent urinary tract infections.     She lives at [facility], [place] and has a gait disorder requiring a walker, history of right-sided stroke in February 2000, temporal arteritis with positive left temporal artery biopsy in March 2000.   MRI in 2006 showed extensive pontine, periventricular, and subcortical white matter changes as well as ventriculomegaly and MRI.  She has had confusional episodes and memory loss. She had a  negative temporal artery biopsy in February 2011.  She was started on prednisone 60 mg daily because of continued headaches in March 2011. They were tapered down to 1 mg every other day, but in December 2012 her ESR was 114.   MRI of the brain showed severe periventricular subcortical and pontine chronic small vessel ischemic disease. She was restarted on high-dose prednisone again, which was slowly tapered to 15 mg daily. In June of 2013, she  was admitted to [facility] with urinary tract infection and was placed on oxygen,  which she uses now as needed.    She was diagnosed with congestive heart failure. She was readmitted to [facility] in December 2013 with sepsis.  She was admitted to [facility] on 12/11/2012 to 12/13/2012 due to altered mental status and was again diagnosed with sepsis with toxic encephalopathy.   She had head CT on 12/11/2012 showed no acute abnormality.Stable atrophy, extensive chronic small vessel white matter ischemic changes in both cerebral hemispheres and extensive chronic small vessel ischemic changes in the pons .  Prior to this she was admitted to [facility] on 3/11 through 12/09/2012 for sepsis secondary to Escherichia coli UTI and C. difficile colitis.  She was re-admitted on 4/30-5/1 for CHF exacerbation, now she is on O2 24/7 at 2 lpm since 2/14.   CXR on 4/30: The no substantial change. Cardiomegaly with vascular congestion and persistent retrocardiac opacity which may be related to atelectasis/infiltrate or scarring. There appears to be a persistent tiny left pleural effusion associated.   In January 2014, she had a seizure in the context of bacterial meningitis. She was in the hosptal from 1/13 to 10/12/12 for that. She was treated for 10 days with broad-spectrum antibiotics, including vanc/rocephin/ampicillin.  She currently denies any chest pain, shortness of breath, but does complain of eye itching.  UPDATE July 25ht 2014:  She is doing well, she is taking prednisone 59m, alternating with 546m no significant side effect.  Most recent ESR in May was 72.  She complains of bilateral feet paresthesia.  UPDATE Jan 26th 2015: She is at assistant living, home oxygen, she enjoys Firef[employer].  She can sit down exercise, sleep well, eat wells. Daughter reported that patient has urinary and bowel incontinence for more than a year and half, significant gait difficulty. She  is continue taking prednisone 10 mg alternating with 5 for possible temporal arteritis   Past Medical History Is Significant For: Past Medical History  Diagnosis Date  . Peripheral neuropathy   . Hypertension   . TIA (transient ischemic attack)   . Elevated sed rate   . Temporal arteritis   . Hypothyroidism   . Colon cancer   . History of hernia repair   . S/P rotator cuff surgery   . Diastolic CHF   . Cardiomegaly - hypertensive   . Chronic anemia   . Chronic steroid use   . Dementia   . Shortness of breath   . Hyperlipidemia   . CHF (congestive heart failure)   . Cardiomyopathy due to hypertension   . Dependence on supplemental oxygen   . Chronic UTI   . Dementia   . Giant cell arteritis     Her Past Surgical History Is Significant For: Past Surgical History  Procedure Laterality Date  . Tonsillectomy  1939  . Colectomy  1987  . Abdominal hysterectomy  1974  . Appendectomy  1947  . Hernia repair  1990  . Cholecystectomy  2000  . Rotator cuff repair  2004  . Right temporal artery biopsy  10/1998, 10/2009  . Right cataract extraction  2011  . Eye surgery    . Lumbar puncture  10/07/2004    Her Family History Is Significant For: Family History  Problem Relation Age of Onset  . Dementia Mother   . Heart attack Father     Social History Is Significant For: History   Social History  . Marital Status: Widowed    Spouse Name: N/A    Number of Children: 2  . Years of Education: N/A   Occupational History  . RETIRED    Social History Main Topics  . Smoking status: Never Smoker   . Smokeless tobacco: Never Used  . Alcohol Use: No  . Drug Use: No  . Sexually Active: Not Currently   Other Topics Concern  . None   Social History Narrative   Widowed.  Has lived with her daughter for the past 2.5 years, but most recently d/c'd to ALF/SNF.  Ambulates with a walker.     Allergies Are:  Allergies  Allergen Reactions  . Apap (Acetaminophen)     Reaction  unknown  . Ciprofloxacin Nausea And Vomiting  . Codeine Other (See Comments)    halucinations  . Demerol Other (See Comments)    halucinations  . Morphine And Related Other (See Comments)    halucinations  . Nitrofurantoin Monohyd Macro Other (See Comments)    Per MAR  . Pregabalin Other (See Comments)    unknown  . Septra (Bactrim) Other (See Comments)    Unknown cant tolerate DS  :   Her Current Medications Are:  Outpatient Encounter Prescriptions as of 10/21/2013  Medication Sig  . acetaminophen (TYLENOL) 500 MG tablet Take 500 mg by mouth every 6 (six) hours as needed for pain.  Marland Kitchen amLODipine (NORVASC) 5 MG tablet Take 5 mg by mouth every morning.  Marland Kitchen BETA CAROTENE PO Take 1 capsule by mouth daily.  . Cranberry 250 MG CAPS Take 500 mg  by mouth 2 (two) times daily.  Marland Kitchen dipyridamole-aspirin (AGGRENOX) 25-200 MG per 12 hr capsule Take 1 capsule by mouth 2 (two) times daily.    Marland Kitchen donepezil (ARICEPT) 10 MG tablet Take 10 mg by mouth at bedtime.  . ferrous sulfate 325 (65 FE) MG tablet Take 325 mg by mouth daily with breakfast.  . fluconazole (DIFLUCAN) 100 MG tablet Take 100 mg by mouth daily.  Marland Kitchen gatifloxacin (ZYMAXID) 0.5 % SOLN Place 1 drop into the left eye 4 (four) times daily.  . hydrALAZINE (APRESOLINE) 10 MG tablet Take 1 tablet (10 mg total) by mouth every 8 (eight) hours.  Marland Kitchen ketoconazole (NIZORAL) 2 % cream   . levothyroxine (SYNTHROID, LEVOTHROID) 150 MCG tablet Take 150 mcg by mouth daily before breakfast.  . omeprazole (PRILOSEC) 20 MG capsule Take 20 mg by mouth every morning.   . ondansetron (ZOFRAN) 4 MG tablet Take 1 tablet (4 mg total) by mouth every 6 (six) hours as needed for nausea.  . potassium chloride SA (K-DUR,KLOR-CON) 20 MEQ tablet Take 20 mEq by mouth 2 (two) times daily.  . prednisoLONE acetate (PRED FORTE) 1 % ophthalmic suspension   . predniSONE (DELTASONE) 10 MG tablet Take 10 mg by mouth every other day. Alternating with 63m dose  . predniSONE  (DELTASONE) 5 MG tablet Take 15 mg by mouth every other day. Alternating with 145mdose  . PROLENSA 0.07 % SOLN   . saccharomyces boulardii (FLORASTOR) 250 MG capsule Take 250 mg by mouth 2 (two) times daily.  . sertraline (ZOLOFT) 50 MG tablet Take 50 mg by mouth every morning.   . vancomycin (VANCOCIN) 1000 MG injection     Review of Systems: Bowel and bladder incontinence, runny nose, eye pain, diarrhea, incontinence of bowel and bladder, joint pain, bruise easily, memory loss.   Objective:  Neurologic Exam  Physical Exam Physical Examination:   Filed Vitals:   10/21/13 1339  BP: 146/68  Pulse: 66   PHYSICAL EXAMINATOINS:  Generalized: In no acute distress  Neck: Supple, no carotid bruits   Cardiac: Regular rate rhythm  Pulmonary: Clear to auscultation bilaterally  Musculoskeletal: No deformity  Neurological examination  Mentation: Alert oriented to time, place, history taking, and causual conversation, MMSE 26/30, she is not oriented to date, missed 1/3 recall, hs difficulty copy figure.  Cranial nerve II-XII: Pupils were equal round reactive to light extraocular movements were full, visual field were full on confrontational test. facial sensation and strength were normal. hearing was intact to finger rubbing bilaterally. Uvula tongue midline.  head turning and shoulder shrug and were normal and symmetric.Tongue protrusion into cheek strength was normal.  Motor:  Moderate bilateral ankle dorsiflexion weakness, rest of bilateral upper extremity motor strength was normal, bilateral proximal lower extremity proximal motor strength was normal.  Sensory:  length dependent decreased fine touch, pinprick to distal leg.  Coordination: Normal finger to nose, heel-to-shin bilaterally there was no truncal ataxia  Gait: need to push up to get up from seated position, cautious, wide based, mildly unsteady gait, bilateral foot drop, difficult initiate gait  Romberg signs:  Negative  Deep tendon reflexes: Brachioradialis 1/1, biceps 1/1, triceps 1/1, patellar 1/1, Achilles absent, plantar responses were flexor bilaterally.   Assessment and Plan:   Assessment and Plan:   ElJESSLYN VIGLIONEs a very pleasant [age].o.-year old female with a  complex history of recurrent TIAs, history of stroke, diagnosis of NPH in the past, and diagnosis of giant cell arteritis now with  overall deconditioning, recurrent sepsis and recurrent hospitalizations including recent history of bacterial meningitis, urosepsis, pneumonia in the recent past, and CHF exacerbation.   1.  laboratory evaluation today, ESR, C-reactive protein, 2 I will decrease her prednisone to 5 mg every day 3 bone density scan 4 her significant distal leg weakness, bowel and bladder incontinence is suggestive of lumbar stenosis, I have suggested MRI of lumbar, daughter declined, which is understandable.    5. return to clinic in 6 months with Hoyle Sauer.

## 2013-10-22 LAB — SEDIMENTATION RATE: Sed Rate: 62 mm/hr — ABNORMAL HIGH (ref 0–40)

## 2013-10-22 LAB — C-REACTIVE PROTEIN: CRP: 61.8 mg/L — ABNORMAL HIGH (ref 0.0–4.9)

## 2013-10-22 NOTE — Progress Notes (Signed)
Quick Note:  [name], Please call patient, CRP 61.8. ESR 62, I will refer her rheumatologist for 2nd opinion. ______

## 2013-10-22 NOTE — Addendum Note (Signed)
Addended by: Marcial Pacas on: 10/22/2013 11:04 AM   Modules accepted: Orders

## 2013-10-23 ENCOUNTER — Telehealth: Payer: Self-pay | Admitting: Neurology

## 2013-10-23 NOTE — Telephone Encounter (Signed)
Spoke with daughter and she is wanting to know why patient is being referred to rheumatologist,why the second opinion

## 2013-10-23 NOTE — Telephone Encounter (Signed)
QUESTIONS ABOUT BEING REFERRED TO RHEUMATALOGIST

## 2013-10-24 ENCOUNTER — Other Ambulatory Visit: Payer: Self-pay | Admitting: Neurology

## 2013-10-24 DIAGNOSIS — IMO0002 Reserved for concepts with insufficient information to code with codable children: Secondary | ICD-10-CM

## 2013-10-24 DIAGNOSIS — E0590 Thyrotoxicosis, unspecified without thyrotoxic crisis or storm: Secondary | ICD-10-CM

## 2013-10-24 DIAGNOSIS — R627 Adult failure to thrive: Secondary | ICD-10-CM

## 2013-10-24 NOTE — Telephone Encounter (Signed)
Please let patient;s family know

## 2013-10-24 NOTE — Telephone Encounter (Signed)
Spoke to patient's daughter and explained reason for Rheumatology referral.  She also asked about the bone density study, the order was put in and I will forward to [facility].

## 2013-10-24 NOTE — Telephone Encounter (Signed)
Dr. Raul [facility] said the order for a bone density will not be covered unless patient has a Vit D deficiency or an estrogen deficiency.  Also [place] [facility] said the correct code to order test is [id number] [id number].   Please advise.

## 2013-10-25 ENCOUNTER — Emergency Department (HOSPITAL_COMMUNITY): Payer: Medicare Other

## 2013-10-25 ENCOUNTER — Encounter (HOSPITAL_COMMUNITY): Payer: Self-pay | Admitting: Emergency Medicine

## 2013-10-25 ENCOUNTER — Inpatient Hospital Stay (HOSPITAL_COMMUNITY)
Admission: EM | Admit: 2013-10-25 | Discharge: 2013-11-01 | DRG: 371 | Disposition: A | Discharge status: Skilled Nursing Facility | Payer: Medicare Other | Attending: Internal Medicine | Admitting: Internal Medicine

## 2013-10-25 DIAGNOSIS — F29 Unspecified psychosis not due to a substance or known physiological condition: Secondary | ICD-10-CM | POA: Diagnosis present

## 2013-10-25 DIAGNOSIS — M316 Other giant cell arteritis: Secondary | ICD-10-CM | POA: Diagnosis present

## 2013-10-25 DIAGNOSIS — D899 Disorder involving the immune mechanism, unspecified: Secondary | ICD-10-CM | POA: Diagnosis present

## 2013-10-25 DIAGNOSIS — F3289 Other specified depressive episodes: Secondary | ICD-10-CM | POA: Diagnosis present

## 2013-10-25 DIAGNOSIS — E785 Hyperlipidemia, unspecified: Secondary | ICD-10-CM | POA: Diagnosis present

## 2013-10-25 DIAGNOSIS — F0390 Unspecified dementia without behavioral disturbance: Secondary | ICD-10-CM | POA: Diagnosis present

## 2013-10-25 DIAGNOSIS — N183 Chronic kidney disease, stage 3 unspecified: Secondary | ICD-10-CM | POA: Diagnosis present

## 2013-10-25 DIAGNOSIS — I110 Hypertensive heart disease with heart failure: Secondary | ICD-10-CM | POA: Diagnosis present

## 2013-10-25 DIAGNOSIS — I509 Heart failure, unspecified: Secondary | ICD-10-CM | POA: Diagnosis present

## 2013-10-25 DIAGNOSIS — R531 Weakness: Secondary | ICD-10-CM

## 2013-10-25 DIAGNOSIS — A498 Other bacterial infections of unspecified site: Secondary | ICD-10-CM

## 2013-10-25 DIAGNOSIS — D649 Anemia, unspecified: Secondary | ICD-10-CM

## 2013-10-25 DIAGNOSIS — R110 Nausea: Secondary | ICD-10-CM | POA: Diagnosis present

## 2013-10-25 DIAGNOSIS — Z888 Allergy status to other drugs, medicaments and biological substances status: Secondary | ICD-10-CM

## 2013-10-25 DIAGNOSIS — Z9981 Dependence on supplemental oxygen: Secondary | ICD-10-CM

## 2013-10-25 DIAGNOSIS — Z8673 Personal history of transient ischemic attack (TIA), and cerebral infarction without residual deficits: Secondary | ICD-10-CM

## 2013-10-25 DIAGNOSIS — K219 Gastro-esophageal reflux disease without esophagitis: Secondary | ICD-10-CM

## 2013-10-25 DIAGNOSIS — Z87440 Personal history of urinary (tract) infections: Secondary | ICD-10-CM

## 2013-10-25 DIAGNOSIS — F329 Major depressive disorder, single episode, unspecified: Secondary | ICD-10-CM

## 2013-10-25 DIAGNOSIS — Z885 Allergy status to narcotic agent status: Secondary | ICD-10-CM

## 2013-10-25 DIAGNOSIS — I5030 Unspecified diastolic (congestive) heart failure: Secondary | ICD-10-CM

## 2013-10-25 DIAGNOSIS — E039 Hypothyroidism, unspecified: Secondary | ICD-10-CM

## 2013-10-25 DIAGNOSIS — Z85038 Personal history of other malignant neoplasm of large intestine: Secondary | ICD-10-CM

## 2013-10-25 DIAGNOSIS — I10 Essential (primary) hypertension: Secondary | ICD-10-CM

## 2013-10-25 DIAGNOSIS — N390 Urinary tract infection, site not specified: Secondary | ICD-10-CM | POA: Diagnosis present

## 2013-10-25 DIAGNOSIS — G609 Hereditary and idiopathic neuropathy, unspecified: Secondary | ICD-10-CM | POA: Diagnosis present

## 2013-10-25 DIAGNOSIS — A0472 Enterocolitis due to Clostridium difficile, not specified as recurrent: Principal | ICD-10-CM | POA: Diagnosis present

## 2013-10-25 DIAGNOSIS — R32 Unspecified urinary incontinence: Secondary | ICD-10-CM | POA: Diagnosis present

## 2013-10-25 DIAGNOSIS — I4891 Unspecified atrial fibrillation: Secondary | ICD-10-CM

## 2013-10-25 DIAGNOSIS — R5381 Other malaise: Secondary | ICD-10-CM

## 2013-10-25 DIAGNOSIS — E861 Hypovolemia: Secondary | ICD-10-CM | POA: Diagnosis present

## 2013-10-25 DIAGNOSIS — IMO0002 Reserved for concepts with insufficient information to code with codable children: Secondary | ICD-10-CM

## 2013-10-25 DIAGNOSIS — E871 Hypo-osmolality and hyponatremia: Secondary | ICD-10-CM | POA: Diagnosis present

## 2013-10-25 DIAGNOSIS — I43 Cardiomyopathy in diseases classified elsewhere: Secondary | ICD-10-CM | POA: Diagnosis present

## 2013-10-25 DIAGNOSIS — R5383 Other fatigue: Secondary | ICD-10-CM

## 2013-10-25 DIAGNOSIS — I5033 Acute on chronic diastolic (congestive) heart failure: Secondary | ICD-10-CM | POA: Diagnosis present

## 2013-10-25 LAB — URINALYSIS, ROUTINE W REFLEX MICROSCOPIC
Bilirubin Urine: NEGATIVE
Glucose, UA: NEGATIVE mg/dL
KETONES UR: NEGATIVE mg/dL
Nitrite: NEGATIVE
PH: 5.5 (ref 5.0–8.0)
PROTEIN: 30 mg/dL — AB
Specific Gravity, Urine: 1.016 (ref 1.005–1.030)
Urobilinogen, UA: 0.2 mg/dL (ref 0.0–1.0)

## 2013-10-25 LAB — CBC WITH DIFFERENTIAL/PLATELET
BASOS ABS: 0 10*3/uL (ref 0.0–0.1)
Basophils Relative: 0 % (ref 0–1)
EOS ABS: 0.2 10*3/uL (ref 0.0–0.7)
Eosinophils Relative: 2 % (ref 0–5)
HCT: 32.8 % — ABNORMAL LOW (ref 36.0–46.0)
Hemoglobin: 10.8 g/dL — ABNORMAL LOW (ref 12.0–15.0)
LYMPHS ABS: 1 10*3/uL (ref 0.7–4.0)
Lymphocytes Relative: 14 % (ref 12–46)
MCH: 28.6 pg (ref 26.0–34.0)
MCHC: 32.9 g/dL (ref 30.0–36.0)
MCV: 86.8 fL (ref 78.0–100.0)
Monocytes Absolute: 0.5 10*3/uL (ref 0.1–1.0)
Monocytes Relative: 7 % (ref 3–12)
NEUTROS PCT: 76 % (ref 43–77)
Neutro Abs: 5.6 10*3/uL (ref 1.7–7.7)
PLATELETS: 261 10*3/uL (ref 150–400)
RBC: 3.78 MIL/uL — ABNORMAL LOW (ref 3.87–5.11)
RDW: 14.9 % (ref 11.5–15.5)
WBC: 7.3 10*3/uL (ref 4.0–10.5)

## 2013-10-25 LAB — URINE MICROSCOPIC-ADD ON

## 2013-10-25 LAB — COMPREHENSIVE METABOLIC PANEL
ALBUMIN: 2.5 g/dL — AB (ref 3.5–5.2)
ALT: 15 U/L (ref 0–35)
AST: 15 U/L (ref 0–37)
Alkaline Phosphatase: 87 U/L (ref 39–117)
BUN: 13 mg/dL (ref 6–23)
CALCIUM: 8.7 mg/dL (ref 8.4–10.5)
CO2: 25 mEq/L (ref 19–32)
Chloride: 97 mEq/L (ref 96–112)
Creatinine, Ser: 1.17 mg/dL — ABNORMAL HIGH (ref 0.50–1.10)
GFR calc non Af Amer: 43 mL/min — ABNORMAL LOW (ref >90)
GFR, EST AFRICAN AMERICAN: 49 mL/min — AB (ref >90)
GLUCOSE: 112 mg/dL — AB (ref 70–99)
POTASSIUM: 3.9 meq/L (ref 3.7–5.3)
Sodium: 134 mEq/L — ABNORMAL LOW (ref 137–147)
Total Bilirubin: 0.2 mg/dL — ABNORMAL LOW (ref 0.3–1.2)
Total Protein: 6.9 g/dL (ref 6.0–8.3)

## 2013-10-25 LAB — PHOSPHORUS: Phosphorus: 2.6 mg/dL (ref 2.3–4.6)

## 2013-10-25 LAB — PROTIME-INR
INR: 1.05 (ref 0.00–1.49)
Prothrombin Time: 13.5 seconds (ref 11.6–15.2)

## 2013-10-25 LAB — TROPONIN I

## 2013-10-25 LAB — APTT: aPTT: 21 seconds — ABNORMAL LOW (ref 24–37)

## 2013-10-25 LAB — PRO B NATRIURETIC PEPTIDE: Pro B Natriuretic peptide (BNP): 1044 pg/mL — ABNORMAL HIGH (ref 0–450)

## 2013-10-25 LAB — MAGNESIUM: MAGNESIUM: 1.7 mg/dL (ref 1.5–2.5)

## 2013-10-25 MED ORDER — SODIUM CHLORIDE 0.9 % IJ SOLN
3.0000 mL | Freq: Two times a day (BID) | INTRAMUSCULAR | Status: DC
  Administered 2013-10-25 – 2013-11-01 (×14): 3 mL via INTRAVENOUS

## 2013-10-25 MED ORDER — LEVOTHYROXINE SODIUM 150 MCG PO TABS
150.0000 ug | ORAL_TABLET | Freq: Every day | ORAL | Status: DC
  Administered 2013-10-26 – 2013-10-31 (×6): 150 ug via ORAL
  Filled 2013-10-25 (×8): qty 1

## 2013-10-25 MED ORDER — HYDRALAZINE HCL 10 MG PO TABS
10.0000 mg | ORAL_TABLET | Freq: Three times a day (TID) | ORAL | Status: DC
  Administered 2013-10-25 – 2013-11-01 (×20): 10 mg via ORAL
  Filled 2013-10-25 (×23): qty 1

## 2013-10-25 MED ORDER — AMLODIPINE BESYLATE 5 MG PO TABS
5.0000 mg | ORAL_TABLET | Freq: Every morning | ORAL | Status: DC
  Administered 2013-10-26 – 2013-11-01 (×7): 5 mg via ORAL
  Filled 2013-10-25 (×7): qty 1

## 2013-10-25 MED ORDER — SODIUM CHLORIDE 0.9 % IV SOLN
Freq: Once | INTRAVENOUS | Status: AC
Start: 2013-10-25 — End: 2013-10-25
  Administered 2013-10-25: 11:00:00 via INTRAVENOUS

## 2013-10-25 MED ORDER — CEFTRIAXONE SODIUM 1 G IJ SOLR
1.0000 g | Freq: Once | INTRAMUSCULAR | Status: AC
  Administered 2013-10-25: 1 g via INTRAVENOUS
  Filled 2013-10-25: qty 10

## 2013-10-25 MED ORDER — HEPARIN SODIUM (PORCINE) 5000 UNIT/ML IJ SOLN
5000.0000 [IU] | Freq: Three times a day (TID) | INTRAMUSCULAR | Status: DC
Start: 2013-10-25 — End: 2013-10-25
  Filled 2013-10-25 (×2): qty 1

## 2013-10-25 MED ORDER — DIPHENHYDRAMINE HCL 50 MG/ML IJ SOLN
25.0000 mg | Freq: Once | INTRAMUSCULAR | Status: AC
  Administered 2013-10-25: 25 mg via INTRAVENOUS
  Filled 2013-10-25: qty 1

## 2013-10-25 MED ORDER — FERROUS SULFATE 325 (65 FE) MG PO TABS
325.0000 mg | ORAL_TABLET | Freq: Every day | ORAL | Status: DC
  Administered 2013-10-26 – 2013-11-01 (×7): 325 mg via ORAL
  Filled 2013-10-25 (×8): qty 1

## 2013-10-25 MED ORDER — ASPIRIN-DIPYRIDAMOLE ER 25-200 MG PO CP12
1.0000 | ORAL_CAPSULE | Freq: Two times a day (BID) | ORAL | Status: DC
  Administered 2013-10-25 – 2013-11-01 (×14): 1 via ORAL
  Filled 2013-10-25 (×16): qty 1

## 2013-10-25 MED ORDER — ONDANSETRON HCL 4 MG/2ML IJ SOLN: 4.0000 mg | Freq: Four times a day (QID) | INTRAMUSCULAR | Status: DC | PRN

## 2013-10-25 MED ORDER — OSELTAMIVIR PHOSPHATE 30 MG PO CAPS
30.0000 mg | ORAL_CAPSULE | Freq: Two times a day (BID) | ORAL | Status: DC
  Administered 2013-10-25 – 2013-10-26 (×2): 30 mg via ORAL
  Filled 2013-10-25 (×3): qty 1

## 2013-10-25 MED ORDER — PREDNISONE 5 MG PO TABS
5.0000 mg | ORAL_TABLET | Freq: Every day | ORAL | Status: DC
  Administered 2013-10-26 – 2013-11-01 (×7): 5 mg via ORAL
  Filled 2013-10-25 (×8): qty 1

## 2013-10-25 MED ORDER — SERTRALINE HCL 50 MG PO TABS
50.0000 mg | ORAL_TABLET | Freq: Every morning | ORAL | Status: DC
  Administered 2013-10-26 – 2013-11-01 (×7): 50 mg via ORAL
  Filled 2013-10-25 (×7): qty 1

## 2013-10-25 MED ORDER — ONDANSETRON HCL 4 MG PO TABS: 4.0000 mg | ORAL_TABLET | Freq: Four times a day (QID) | ORAL | Status: DC | PRN

## 2013-10-25 MED ORDER — DONEPEZIL HCL 10 MG PO TABS
10.0000 mg | ORAL_TABLET | Freq: Every day | ORAL | Status: DC
  Administered 2013-10-25 – 2013-10-31 (×7): 10 mg via ORAL
  Filled 2013-10-25 (×8): qty 1

## 2013-10-25 MED ORDER — SACCHAROMYCES BOULARDII 250 MG PO CAPS
250.0000 mg | ORAL_CAPSULE | Freq: Two times a day (BID) | ORAL | Status: DC
  Administered 2013-10-25 – 2013-11-01 (×14): 250 mg via ORAL
  Filled 2013-10-25 (×15): qty 1

## 2013-10-25 MED ORDER — PANTOPRAZOLE SODIUM 40 MG PO TBEC
80.0000 mg | DELAYED_RELEASE_TABLET | Freq: Every day | ORAL | Status: DC
  Administered 2013-10-26 – 2013-10-28 (×3): 80 mg via ORAL
  Filled 2013-10-25 (×2): qty 2

## 2013-10-25 MED ORDER — CEFTRIAXONE SODIUM 1 G IJ SOLR
1.0000 g | INTRAMUSCULAR | Status: DC
  Filled 2013-10-25: qty 10

## 2013-10-25 MED ORDER — OSELTAMIVIR PHOSPHATE 75 MG PO CAPS: 75.0000 mg | ORAL_CAPSULE | Freq: Two times a day (BID) | ORAL | Status: DC

## 2013-10-25 NOTE — Progress Notes (Signed)
MD paged with pt's arrival to floor l by Suezanne Cheshire, RN &CN.  MD's currently at pt's bedside at this time.  Karie Kirks, Therapist, sports.

## 2013-10-25 NOTE — Progress Notes (Signed)
Patient has arrived to the unit, patient is stable. Ruben Im RN

## 2013-10-25 NOTE — ED Notes (Signed)
Dr. Nanavati at bedside 

## 2013-10-25 NOTE — ED Notes (Signed)
EMS also reports that patient's friend whom she eats with at nursing home recently was diagnosed with C. Diff

## 2013-10-25 NOTE — ED Notes (Signed)
Per Dr. Kathrynn Humble, patient is appropriate for Pod C

## 2013-10-25 NOTE — H&P (Signed)
Date: 10/25/2013               Patient Name:  [name] MRN: [id number]  DOB: [birth year] Age / Sex: 80 y.o., female   PCP: Sande Brothers, MD         Medical Service: Internal Medicine Teaching Service         Attending Physician: Dr. Sid Falcon, MD    First Contact: Dr. Naaman Plummer Pager: [phone]  Second Contact: Dr. Eulas Post  Pager: [phone]       After Hours (After 5p/  First Contact Pager: [phone]  weekends / holidays): Second Contact Pager: [phone]   Chief Complaint: diarrhea and weakness  History of Present Illness:   [name] is a 80 year old woman with past medical history of hypertension, hyperlipidemia,  hypothyroidism, recurrent TIAs, stroke, NPH, giant cell arteritis on corticsteroids, deconditioning, bacterial meningitis, recurrent UTI's,   and depression who presents with generalized weakness of 2 day duration.        Pt not able to give a clear history. Pt reports feeling weak, tired, headache, decreased PO intake, nasal congestion,  and diarrhea.   Per assisted living facility, yesterday she was lethargic and weak and unable to ambulate with walker. She was having to use her wheelchair in the past 2 days. She slipped from her wheelchair a few days due to weakness with no head trauma.  She was also reported to not being her normal self and less verbal with decreased PO intake. She also had loose stools on and off for the last week with no blood in stool. Pt with urinary incontinence at baseline with reported burning with urination and groin pain. Pt with history of recurrent UTI's with hospitalization 3/11-3/16 secondary to E. Coli UTI and c. Diff colitis.  There were also report that residents of the assisted [place] where she lives had recent influenza outbreak.   Pt's daughter could not be contacted.    Meds: No current facility-administered medications on file prior to encounter.   Current Outpatient Prescriptions on File Prior to Encounter    Medication Sig Dispense Refill  . acetaminophen (TYLENOL) 500 MG tablet Take 500 mg by mouth every 6 (six) hours as needed for pain.      Marland Kitchen amLODipine (NORVASC) 5 MG tablet Take 5 mg by mouth every morning.      Marland Kitchen BETA CAROTENE PO Take 1 capsule by mouth daily.      . Cranberry 250 MG CAPS Take 500 mg by mouth 2 (two) times daily.      Marland Kitchen dipyridamole-aspirin (AGGRENOX) 25-200 MG per 12 hr capsule Take 1 capsule by mouth 2 (two) times daily.        Marland Kitchen donepezil (ARICEPT) 10 MG tablet Take 10 mg by mouth at bedtime.      . ferrous sulfate 325 (65 FE) MG tablet Take 325 mg by mouth daily with breakfast.      . hydrALAZINE (APRESOLINE) 10 MG tablet Take 1 tablet (10 mg total) by mouth every 8 (eight) hours.  90 tablet  0  . levothyroxine (SYNTHROID, LEVOTHROID) 150 MCG tablet Take 150 mcg by mouth daily before breakfast.      . omeprazole (PRILOSEC) 20 MG capsule Take 20 mg by mouth 2 (two) times daily before a meal.       . ondansetron (ZOFRAN) 4 MG tablet Take 1 tablet (4 mg total) by mouth every 6 (six) hours as needed for nausea.  20 tablet  0  .  potassium chloride SA (K-DUR,KLOR-CON) 20 MEQ tablet Take 20 mEq by mouth 2 (two) times daily.      . predniSONE (DELTASONE) 10 MG tablet Take 10 mg by mouth every other day. Alternating with 62m dose      . predniSONE (DELTASONE) 5 MG tablet Take 15 mg by mouth every other day. Alternating with 141mdose      . saccharomyces boulardii (FLORASTOR) 250 MG capsule Take 250 mg by mouth 2 (two) times daily.      . sertraline (ZOLOFT) 50 MG tablet Take 50 mg by mouth every morning.         No current facility-administered medications for this encounter.    Allergies: Allergies as of 10/25/2013 - Review Complete 10/25/2013  Allergen Reaction Noted  . Apap [acetaminophen]  10/08/2012  . Ceftriaxone Itching 07/19/2013  . Ciprofloxacin Nausea And Vomiting 09/28/2011  . Codeine Other (See Comments) 09/28/2011  . Demerol Other (See Comments) 09/28/2011   . Metronidazole Itching 07/19/2013  . Morphine and related Other (See Comments) 09/28/2011  . Nitrofurantoin monohyd macro Other (See Comments) 09/02/2012  . Pregabalin Other (See Comments) 09/28/2011  . Septra [bactrim] Other (See Comments) 09/28/2011   Past Medical History  Diagnosis Date  . Peripheral neuropathy   . Hypertension   . TIA (transient ischemic attack)   . Elevated sed rate   . Temporal arteritis     chronic steroids  . Hypothyroidism   . Colon cancer     colon  . History of hernia repair   . S/P rotator cuff surgery     right  . Diastolic CHF   . Cardiomegaly - hypertensive   . Chronic anemia   . Chronic steroid use   . Dementia   . Shortness of breath   . Hyperlipidemia   . CHF (congestive heart failure)   . Cardiomyopathy due to hypertension   . Dependence on supplemental oxygen   . Chronic UTI   . Dementia   . Giant cell arteritis    Past Surgical History  Procedure Laterality Date  . Tonsillectomy  1939  . Colectomy  1987  . Abdominal hysterectomy  1974  . Appendectomy  1947  . Hernia repair  1990  . Cholecystectomy  2000  . Rotator cuff repair  2004  . Right temporal artery biopsy  10/1998, 10/2009  . Right cataract extraction  2011  . Eye surgery Bilateral   . Lumbar puncture  10/07/2004   Family History  Problem Relation Age of Onset  . Dementia Mother   . Heart attack Father    History   Social History  . Marital Status: Widowed    Spouse Name: N/A    Number of Children: 2  . Years of Education: 13   Occupational History  . RETIRED    Social History Main Topics  . Smoking status: Never Smoker   . Smokeless tobacco: Never Used  . Alcohol Use: No  . Drug Use: No  . Sexual Activity: Not Currently   Other Topics Concern  . Not on file   Social History Narrative   Widowed.  Has lived with her daughter for the past 2.5 years, but most recently d/c'd to ALF/SNF.  Ambulates with a walker.      Patient lives in assisted living  [facility]  Retired.   Education- one year of college.   Right handed- Caffeine one cup of coffee daily.    Review of Systems: Review of Systems  Constitutional: Positive for malaise/fatigue. Negative for fever, chills, weight loss and diaphoresis.  HENT: Positive for congestion. Negative for sore throat.   Respiratory: Positive for shortness of breath (chronic). Negative for cough and wheezing.   Cardiovascular: Positive for leg swelling. Negative for chest pain and palpitations.  Gastrointestinal: Positive for nausea, abdominal pain (left sided groin pain) and diarrhea. Negative for heartburn, vomiting, constipation and blood in stool.  Genitourinary: Positive for frequency (baseline). Negative for hematuria and flank pain.  Musculoskeletal: Positive for falls.  Skin: Negative for rash.  Neurological: Positive for weakness and headaches. Negative for dizziness, sensory change, speech change and loss of consciousness.     Physical Exam: Blood pressure 150/57, pulse 80, temperature 100.1 F (37.8 C), temperature source Oral, resp. rate 20, height 5' 10"  (1.778 m), weight 92.5 kg (203 lb 14.8 oz), SpO2 94.00%. Physical Exam  Constitutional: She appears well-developed and well-nourished.  HENT:  Head: Normocephalic and atraumatic.  Right Ear: External ear normal.  Left Ear: External ear normal.  Nose: Nose normal.  Mouth/Throat: Oropharynx is clear and moist. No oropharyngeal exudate.  Eyes: Conjunctivae and EOM are normal. Pupils are equal, round, and reactive to light. Right eye exhibits no discharge. Left eye exhibits no discharge. No scleral icterus.  Xanthoma above left eye  Neck: Normal range of motion. Neck supple.  Cardiovascular: Normal rate, regular rhythm and normal heart sounds.   Pulmonary/Chest: Effort normal and breath sounds normal. No respiratory distress. She has no wheezes. She has no rales. She exhibits no tenderness.  Abdominal: Soft. Bowel sounds are  normal. There is tenderness (LLQ/groin ). There is no rebound and no guarding.  Musculoskeletal: Normal range of motion. She exhibits edema (+1 b/l pitting edema) and tenderness.  Neurological: She is alert. No cranial nerve deficit.  Orienated to self and place  Normal strength 5/5 throughout Normal sensation to light touch No dysarthria No pronator drift Finger to nose past pointing     Skin: Skin is warm and dry.     Lab results: Basic Metabolic Panel:  Recent Labs  10/25/13 1023  NA 134*  K 3.9  CL 97  CO2 25  GLUCOSE 112*  BUN 13  CREATININE 1.17*  CALCIUM 8.7  MG 1.7  PHOS 2.6   Liver Function Tests:  Recent Labs  10/25/13 1023  AST 15  ALT 15  ALKPHOS 87  BILITOT 0.2*  PROT 6.9  ALBUMIN 2.5*   No results found for this basename: LIPASE, AMYLASE,  in the last 72 hours No results found for this basename: AMMONIA,  in the last 72 hours CBC:  Recent Labs  10/25/13 1023  WBC 7.3  NEUTROABS 5.6  HGB 10.8*  HCT 32.8*  MCV 86.8  PLT 261   Cardiac Enzymes:  Recent Labs  10/25/13 1023  TROPONINI <0.30   BNP:  Recent Labs  10/25/13 1023  PROBNP 1044.0*   D-Dimer: No results found for this basename: DDIMER,  in the last 72 hours CBG: No results found for this basename: GLUCAP,  in the last 72 hours Hemoglobin A1C: No results found for this basename: HGBA1C,  in the last 72 hours Fasting Lipid Panel: No results found for this basename: CHOL, HDL, LDLCALC, TRIG, CHOLHDL, LDLDIRECT,  in the last 72 hours Thyroid Function Tests: No results found for this basename: TSH, T4TOTAL, FREET4, T3FREE, THYROIDAB,  in the last 72 hours Anemia Panel: No results found for this basename: VITAMINB12, FOLATE, FERRITIN, TIBC, IRON, RETICCTPCT,  in the last  72 hours Coagulation:  Recent Labs  10/25/13 1023  LABPROT 13.5  INR 1.05   Urine Drug Screen: Drugs of Abuse  No results found for this basename: labopia, cocainscrnur, labbenz, amphetmu, thcu,  labbarb    Alcohol Level: No results found for this basename: ETH,  in the last 72 hours Urinalysis:  Recent Labs  10/25/13 1056  COLORURINE YELLOW  LABSPEC 1.016  PHURINE 5.5  GLUCOSEU NEGATIVE  HGBUR MODERATE*  BILIRUBINUR NEGATIVE  KETONESUR NEGATIVE  PROTEINUR 30*  UROBILINOGEN 0.2  NITRITE NEGATIVE  LEUKOCYTESUR LARGE*    Imaging results:  Dg Chest Port 1 View  10/25/2013   CLINICAL DATA:  Diarrhea, weakness, nausea, congestion, history hypertension, TIA, CHF, dementia  EXAM: PORTABLE CHEST - 1 VIEW  COMPARISON:  Portable exam 1554 hr compared to 07/19/2013  FINDINGS: Enlargement of cardiac silhouette with pulmonary vascular congestion.  Diffuse infiltrates bilaterally, question pulmonary edema or infection.  Calcified mildly tortuous thoracic aorta.  Small left pleural effusion.  No pneumothorax.  Bones demineralized.  IMPRESSION: Enlargement of cardiac silhouette with pulmonary vascular congestion and diffuse bilateral pulmonary infiltrates favor pulmonary edema over infection.   Electronically Signed   By: Lavonia Dana M.D.   On: 10/25/2013 16:02    Other results: EKG:  Sinus Rhythm  Rate: 71 PR interval: 83 QRS duration: 136 QT/QTc 423/460 Right bundle branch block Atrial Premature complex  Assessment:  80 year old woman with past medical history of hypertension, hyperlipidemia,  hypothyroidism, recurrent TIAs, stroke, NPH, giant cell arteritis on corticsteroids, deconditioning, bacterial meningitis, recurrent UTI's,  and depression who presents with generalized weakness of 2 day duration with recent exposure to influenza and found to have UTI.    Plan:   Generalized Weakness - Pt with 2-day history of generalized weakness, lethargy, decreased PO intake, loose stools, nasal congestion, and headache who presented with fever 100.5 without other criteria for sepsis.  Due to recent influenza exposure at nursing facility, influenza virus possible. Pt with history of  recurrent UTI's with reported urinary burning and groin pain and UA indicating UTI. Pyelonephritis less likely considering no flank pain, rigors, or high fever. Pt also with history of c.diff colitis and recent loose stools. No cough, dyspnea, or chest xray findings to suggest pneumonia. Pt with repeated TIA's with normal neurological exam. Tropinin was negative with no ischemic change on 12-lead EKD. Pt with no electrolyte abnormalities.  Pt received 1g IV ceftriaxone in ED.  -Obtain influenza virus panel, start tamiflu 30 mg BID  -Obtain c. diff -Awaiting urine cultures -Obtain stool cultures -Start IV 1g ceftriaxone for UTI     Giant Cell Temporal Arteritis - Pt with temporal arteritis with positive left temporal artery biopsy in March 2000. Pt with elevated ESR 62 and CRP 61.8 on 10/21/13. -Continue home 5 mg prednisone daily   Hypertension - currently hypertensive -Continue home amlodipine 5 mg daily  -Continue home hydralazine 10 mg TID   Hyponatremia - Pt with Na of 134 most likely due to hypovolemia in setting of decreased PO intake and GI loss (diarrhea).  -Regular diet -Encourage adequate hydration   Normocytic Anemia - Pt with Hg on admission of 10.8 with baseline of Hg 10-12.  -Monitor for bleeding   History of diastolic 2 CHF - Pt with 2D-echo of 05/13/12 with EF 55-60% and grade 2 diastolic dysfunction. 2D-echo on 10/12/12 with EF 34-19% with no diastolic dysfunction. Pt with elevated pro-BNP 1044 and chest xray on 1/30 with enlargement of cardiac silhouette with pulmonary vascular  congestion and diffuse bilateral pulmonary infiltrates suggesting pulmonary edema over infection.  -Obtain 2D-Echo -Monitor volume status with daily weights and I & O's  Stroke - Pt with history of right-sided stroke in February 2000. -Continue home aggrenox 200-25 BID   Hypothyroidism - Pt with last TSH of 0.01 on 01/23/13 -Continue home levothyroxine 150 mcg daily  Depression - currently with  mild altered mental status  -Continue home donepezil 10 mg daily -Continue home sertraline 50 mg daily   GERD - currently without reflux symptoms  -Continue protonix 80 mg daily  Diet: Regular DVT PPx: SCD's Code: Full  Dispo: Disposition is deferred at this time, awaiting improvement of current medical problems. Anticipated discharge in approximately 1-2 day(s).   The patient does have a current PCP (Sande Brothers, MD) and does need an [facility] hospital follow-up appointment after discharge.  The patient does not have transportation limitations that hinder transportation to clinic appointments.  Signed: Juluis Mire, MD 10/25/2013, 6:52 PM

## 2013-10-25 NOTE — ED Notes (Signed)
EMS reports that patient has had increasingly loose stools for 1-1 1/2 week, and increasing weakness for the last 2 days.  This morning patient had diarrhea that she was unable to control, denies vomiting.  EMS reports that staff denies appearance of blood in stool.  Patient from skilled nursing facility, on 4L nasal cannula at home

## 2013-10-25 NOTE — ED Notes (Signed)
Attempted to ambulate patient in room per physician's orders, but patient was unable to even sit on the side of her bed with my assistance due to pain and weakness.

## 2013-10-25 NOTE — ED Notes (Signed)
Patient denies itching

## 2013-10-26 DIAGNOSIS — I059 Rheumatic mitral valve disease, unspecified: Secondary | ICD-10-CM

## 2013-10-26 LAB — CLOSTRIDIUM DIFFICILE BY PCR: Toxigenic C. Difficile by PCR: POSITIVE — AB

## 2013-10-26 LAB — URINE CULTURE: Colony Count: >=100000

## 2013-10-26 LAB — INFLUENZA PANEL BY PCR (TYPE A & B)
H1N1 flu by pcr: NOT DETECTED
Influenza A By PCR: NEGATIVE
Influenza B By PCR: NEGATIVE

## 2013-10-26 LAB — CBC WITH DIFFERENTIAL/PLATELET
BASOS PCT: 0 % (ref 0–1)
Basophils Absolute: 0 10*3/uL (ref 0.0–0.1)
Eosinophils Absolute: 0.2 10*3/uL (ref 0.0–0.7)
Eosinophils Relative: 2 % (ref 0–5)
HCT: 33.1 % — ABNORMAL LOW (ref 36.0–46.0)
Hemoglobin: 10.6 g/dL — ABNORMAL LOW (ref 12.0–15.0)
LYMPHS ABS: 1.3 10*3/uL (ref 0.7–4.0)
LYMPHS PCT: 14 % (ref 12–46)
MCH: 27.9 pg (ref 26.0–34.0)
MCHC: 32 g/dL (ref 30.0–36.0)
MCV: 87.1 fL (ref 78.0–100.0)
MONO ABS: 0.5 10*3/uL (ref 0.1–1.0)
Monocytes Relative: 6 % (ref 3–12)
NEUTROS ABS: 7.2 10*3/uL (ref 1.7–7.7)
NEUTROS PCT: 79 % — AB (ref 43–77)
Platelets: 267 10*3/uL (ref 150–400)
RBC: 3.8 MIL/uL — AB (ref 3.87–5.11)
RDW: 15.2 % (ref 11.5–15.5)
WBC: 9.2 10*3/uL (ref 4.0–10.5)

## 2013-10-26 LAB — BASIC METABOLIC PANEL
BUN: 12 mg/dL (ref 6–23)
CALCIUM: 8.5 mg/dL (ref 8.4–10.5)
CO2: 21 mEq/L (ref 19–32)
CREATININE: 1.1 mg/dL (ref 0.50–1.10)
Chloride: 100 mEq/L (ref 96–112)
GFR, EST AFRICAN AMERICAN: 53 mL/min — AB (ref >90)
GFR, EST NON AFRICAN AMERICAN: 46 mL/min — AB (ref >90)
Glucose, Bld: 99 mg/dL (ref 70–99)
Potassium: 4.3 mEq/L (ref 3.7–5.3)
Sodium: 136 mEq/L — ABNORMAL LOW (ref 137–147)

## 2013-10-26 LAB — GLUCOSE, CAPILLARY: Glucose-Capillary: 100 mg/dL — ABNORMAL HIGH (ref 70–99)

## 2013-10-26 MED ORDER — FOSFOMYCIN TROMETHAMINE 3 G PO PACK
3.0000 g | PACK | Freq: Once | ORAL | Status: AC
  Administered 2013-10-26: 3 g via ORAL
  Filled 2013-10-26: qty 3

## 2013-10-26 MED ORDER — VANCOMYCIN 50 MG/ML ORAL SOLUTION
125.0000 mg | Freq: Four times a day (QID) | ORAL | Status: DC
  Administered 2013-10-26 – 2013-11-01 (×25): 125 mg via ORAL
  Filled 2013-10-26 (×31): qty 2.5

## 2013-10-26 NOTE — Progress Notes (Signed)
The patient's O2 dropped to 88% briefly overnight.  She was turned up to 5L O2 and her oxygen went up to 93%.  She was gradually weaned back down overnight and her O2 levels were 97% on 2 1/2 L this morning.  She is a 2x assist when turning in the bed and is very weak.

## 2013-10-26 NOTE — Progress Notes (Addendum)
Subjective:  Pt seen and examined in AM. No acute events overnight. Pt reports she is feeling better with improved weakness. She is still not very verbal and is not orientated to year. She denies fever, chills, cough, dyspnea, chest pain, lightheadedness, nausea, vomiting, diarrhea, or urinary burning.    Objective: Vital signs in last 24 hours: Filed Vitals:   10/25/13 2101 10/26/13 0147 10/26/13 0439 10/26/13 0618  BP: 135/56 160/52  147/58  Pulse: 90 97  79  Temp: 98.9 F (37.2 C) 98.9 F (37.2 C)  99.1 F (37.3 C)  TempSrc: Oral Oral  Oral  Resp: 20 18  18   Height:      Weight:   94.439 kg (208 lb 3.2 oz)   SpO2: 93% 92%  97%   Weight change:   Intake/Output Summary (Last 24 hours) at 10/26/13 1217 Last data filed at 10/25/13 2036  Gross per 24 hour  Intake    480 ml  Output      2 ml  Net    478 ml   Constitutional: She appears well-developed and well-nourished. Drowsy but arousable. HENT:  Head: Normocephalic and atraumatic.  Right Ear: External ear normal.  Left Ear: External ear normal.  Nose: Nose normal.  Mouth/Throat: Oropharynx is clear and moist. No oropharyngeal exudate.  Eyes: Conjunctivae and EOM are normal. Pupils are equal, round, and reactive to light. Right eye exhibits no discharge. Left eye exhibits no discharge. No scleral icterus.  Xanthoma above eyes  Neck: Normal range of motion. Neck supple.  Cardiovascular: Normal rate, regular rhythm and normal heart sounds.  Pulmonary/Chest: Effort normal and breath sounds normal. No respiratory distress. She has no wheezes. She has no rales. She exhibits no tenderness.  Abdominal: Soft. Bowel sounds are normal. There is no tenderness. There is no rebound and no guarding.  Musculoskeletal: Normal range of motion. She exhibits trace to +1 LE pitting edema.and tenderness.  Neurological: She is alert. No cranial nerve deficit.  Orienated to self and place  Normal strength 5/5 throughout Normal sensation to  light touch No dysarthria Skin: Skin is warm and dry.   Lab Results: Basic Metabolic Panel:  Recent Labs Lab 10/25/13 1023 10/26/13 0315  NA 134* 136*  K 3.9 4.3  CL 97 100  CO2 25 21  GLUCOSE 112* 99  BUN 13 12  CREATININE 1.17* 1.10  CALCIUM 8.7 8.5  MG 1.7  --   PHOS 2.6  --    Liver Function Tests:  Recent Labs Lab 10/25/13 1023  AST 15  ALT 15  ALKPHOS 87  BILITOT 0.2*  PROT 6.9  ALBUMIN 2.5*   No results found for this basename: LIPASE, AMYLASE,  in the last 168 hours No results found for this basename: AMMONIA,  in the last 168 hours CBC:  Recent Labs Lab 10/25/13 1023 10/26/13 0315  WBC 7.3 9.2  NEUTROABS 5.6 7.2  HGB 10.8* 10.6*  HCT 32.8* 33.1*  MCV 86.8 87.1  PLT 261 267   Cardiac Enzymes:  Recent Labs Lab 10/25/13 1023  TROPONINI <0.30   BNP:  Recent Labs Lab 10/25/13 1023  PROBNP 1044.0*   D-Dimer: No results found for this basename: DDIMER,  in the last 168 hours CBG:  Recent Labs Lab 10/26/13 0751  GLUCAP 100*   Hemoglobin A1C: No results found for this basename: HGBA1C,  in the last 168 hours Fasting Lipid Panel: No results found for this basename: CHOL, HDL, LDLCALC, TRIG, CHOLHDL, LDLDIRECT,  in the  last 168 hours Thyroid Function Tests: No results found for this basename: TSH, T4TOTAL, FREET4, T3FREE, THYROIDAB,  in the last 168 hours Coagulation:  Recent Labs Lab 10/25/13 1023  LABPROT 13.5  INR 1.05   Anemia Panel: No results found for this basename: VITAMINB12, FOLATE, FERRITIN, TIBC, IRON, RETICCTPCT,  in the last 168 hours Urine Drug Screen: Drugs of Abuse  No results found for this basename: labopia, cocainscrnur, labbenz, amphetmu, thcu, labbarb    Alcohol Level: No results found for this basename: ETH,  in the last 168 hours Urinalysis:  Recent Labs Lab 10/25/13 1056  COLORURINE YELLOW  LABSPEC 1.016  PHURINE 5.5  GLUCOSEU NEGATIVE  HGBUR MODERATE*  BILIRUBINUR NEGATIVE  KETONESUR  NEGATIVE  PROTEINUR 30*  UROBILINOGEN 0.2  NITRITE NEGATIVE  LEUKOCYTESUR LARGE*     Micro Results: Recent Results (from the past 240 hour(s))  CLOSTRIDIUM DIFFICILE BY PCR     Status: Abnormal   Collection Time    10/25/13  9:41 PM      Result Value Range Status   C difficile by pcr POSITIVE (*) NEGATIVE Final   Comment: CRITICAL RESULT CALLED TO, READ BACK BY AND VERIFIED WITH:     Claudette Head [id number] 10/26/13 SCALES H   Studies/Results: Dg Chest Port 1 View  10/25/2013   CLINICAL DATA:  Diarrhea, weakness, nausea, congestion, history hypertension, TIA, CHF, dementia  EXAM: PORTABLE CHEST - 1 VIEW  COMPARISON:  Portable exam 1554 hr compared to 07/19/2013  FINDINGS: Enlargement of cardiac silhouette with pulmonary vascular congestion.  Diffuse infiltrates bilaterally, question pulmonary edema or infection.  Calcified mildly tortuous thoracic aorta.  Small left pleural effusion.  No pneumothorax.  Bones demineralized.  IMPRESSION: Enlargement of cardiac silhouette with pulmonary vascular congestion and diffuse bilateral pulmonary infiltrates favor pulmonary edema over infection.   Electronically Signed   By: Lavonia Dana M.D.   On: 10/25/2013 16:02   Medications: I have reviewed the patient's current medications. Scheduled Meds: . amLODipine  5 mg Oral q morning - 10a  . dipyridamole-aspirin  1 capsule Oral BID  . donepezil  10 mg Oral QHS  . ferrous sulfate  325 mg Oral Q breakfast  . fosfomycin  3 g Oral Once  . hydrALAZINE  10 mg Oral Q8H  . levothyroxine  150 mcg Oral QAC breakfast  . oseltamivir  30 mg Oral BID  . pantoprazole  80 mg Oral Daily  . predniSONE  5 mg Oral Q breakfast  . saccharomyces boulardii  250 mg Oral BID  . sertraline  50 mg Oral q morning - 10a  . sodium chloride  3 mL Intravenous Q12H  . vancomycin  125 mg Oral Q6H   Continuous Infusions:  PRN Meds:.ondansetron (ZOFRAN) IV, ondansetron Assessment/Plan:  Assessment: 80 year old woman with past medical  history of hypertension, hyperlipidemia, hypothyroidism, recurrent TIAs, stroke, NPH, giant cell arteritis on corticsteroids, deconditioning, bacterial meningitis, recurrent UTI's, and depression who presents with generalized weakness of 2 day duration and found to have UTI and c diff colitis.    Plan:   Generalized Weakness - Pt with 2-day history of generalized weakness, lethargy, decreased PO intake, loose stools, nasal congestion, and headache who presented with fever 100.5 F without other criteria for sepsis. Etiology most likely multifactorial due to c. Diff colitis, UTI, decreased PO intake, recent decrease in steroid dosage, and deconditioning.   -Obtain influenza virus panel --> negative D/C tamiflu 30 mg BID (received 1 day)  -Obtain c. diff --> positive, administer PO  vancomycin -Awaiting urine cultures -->100K multiple bacterial morphotypes suggesting contaminated sample -Awaiting stool cultures  -D/C IV 1g ceftriaxone, administer fosfomycin for UTI  -Consider increasing corticosteroid dose (recently changed from 10-15 mg to 5 mg) -Consider outpatient rheumatology for evaluation of  polymyalgia rheumatica (with h/o temporal arteritis and recent elevated CRP and ESR) -Obtain PT consult --> recommend SNF; 24-hr supervision   Recurrent c. diff colitis - currently with no diarrhea. Pt with 1 week history of loose stools with chronic c. Diff infection -Administer PO vancomycin 125 mg x4 daily, Day 1/14 -Contact precautions  Uncomplicated UTI - Pt with history of urinary incontinence and recurrent UTI's. Pt with +LE on UA.  -Awaiting culture and sensitivities -->100K multiple bacterial morphotypes suggesting contaminated sample -D/C IV 1g ceftriaxone, administer fosfomycin  for UTI   Giant Cell Temporal Arteritis - currently with no headache or vision changes. Pt with temporal arteritis with positive left temporal artery biopsy in March 2000. Pt with elevated ESR 62 and CRP 61.8 on  10/21/13.  -Continue home 5 mg prednisone daily, consider increasing dose since on 1/27 changed from 10-15 mg to 5 mg daily -Consider rheumatology follow-up for possible concomitant polymyalgia rheumatica   Hypertension - currently hypertensive  -Continue home amlodipine 5 mg daily  -Continue home hydralazine 10 mg TID   Hyponatremia - Improved Na 136. Pt with Na of 134 on admission most likely due to hypovolemia in setting of decreased PO intake and GI loss (diarrhea).  -Regular diet  -Encourage adequate hydration   Normocytic Anemia - Hg 10.6 today. Pt with Hg on admission of 10.8 with baseline of Hg 10-12.  -Monitor for bleeding   Diastolic Grade 2 Congestive Heart Failure - Pt with 2D-echo of 05/13/12 with EF 55-60% and grade 2 diastolic dysfunction. 2D-echo on 10/12/12 with EF 10-25% with no diastolic dysfunction. Pt with elevated pro-BNP 1044 and chest xray on 1/30 with enlargement of cardiac silhouette with pulmonary vascular congestion and diffuse bilateral pulmonary infiltrates suggesting pulmonary edema over infection. Also with trace to +1 bilateral pitting LE edema.   -Obtain 2D-Echo 1/31 --> EF 65-70% with grade 2 diastolic dysfunction  -Monitor volume status with daily weights (203 to 208 lb) and I & O's  -If volume status worsens, consider IV lasix administration   Stroke - Pt with history of right-sided stroke in February 2000.  -Continue home aggrenox 200-25 BID   Hypothyroidism - Pt with last TSH of 0.01 on 01/23/13  -Continue home levothyroxine 150 mcg daily   Depression - currently with stable mood  -Continue home donepezil 10 mg daily  -Continue home sertraline 50 mg daily   GERD - currently without reflux symptoms  -Continue protonix 80 mg daily   Diet: Regular  DVT PPx: SCD's  Code: Full   Dispo: Disposition is deferred at this time, awaiting improvement of current medical problems.  Anticipated discharge in approximately 3-5 day(s).   The patient does have  a current PCP (Sande Brothers, MD) and does need an [facility] hospital follow-up appointment after discharge.  The patient does have transportation limitations that hinder transportation to clinic appointments.  .Services Needed at time of discharge: Y = Yes, Blank = No PT:   OT:   RN:   Equipment:   Other:     LOS: 1 day   Juluis Mire, MD 10/26/2013, 12:17 PM

## 2013-10-26 NOTE — Progress Notes (Signed)
  Echocardiogram 2D Echocardiogram has been performed.  [name] 10/26/2013, 9:00 AM

## 2013-10-26 NOTE — Progress Notes (Signed)
OT Cancellation Note  Patient Details Name: [name] MRN: [id number] DOB: 01-13-1930   Cancelled Treatment:    Reason Eval/Treat Not Completed: OT screened, no needs identified, will sign off. PT recommending SNF for rehab. All further OT needs can be met in next venue of care.  Benito Mccreedy OTR/L [phone] 10/26/2013, 2:47 PM

## 2013-10-26 NOTE — Evaluation (Signed)
Physical Therapy Evaluation Patient Details Name: [name] MRN: [id number] DOB: 11-25-1929 Today's Date: 10/26/2013 Time: 7564-3329 PT Time Calculation (min): 23 min  PT Assessment / Plan / Recommendation History of Present Illness  Pt admitted with diarrehea and weakness. She resides at the [facility]  Pt extremely deconditioned requiring increased assist for all mobility. Pt unable to stand at this time. Pt to strongly benefit from SNF to achieve maximal functional recovery for safe transition back to her ALF apt.    PT Assessment  Patient needs continued PT services    Follow Up Recommendations  SNF;Supervision/Assistance - 24 hour    Does the patient have the potential to tolerate intense rehabilitation      Barriers to Discharge        Equipment Recommendations  None recommended by PT    Recommendations for Other Services     Frequency Min 3X/week    Precautions / Restrictions Precautions Precautions: Fall Restrictions Weight Bearing Restrictions: No   Pertinent Vitals/Pain Denies pain, pt on O2, SpO2 > 92%      Mobility  Bed Mobility Overal bed mobility: Needs Assistance Bed Mobility: Supine to Sit;Sit to Supine Supine to sit: Max assist Sit to supine: Max assist General bed mobility comments: v/c's for technique, use of hand rail Transfers Overall transfer level: Needs assistance Transfers: Sit to/from Stand Sit to Stand: Total assist General transfer comment: attempted x 2 but unable to clear bottom from bed, pt did scoot up EOB to [facility] prior to laying down with maxA Ambulation/Gait Ambulation/Gait assistance:  (unable at this time)    Exercises     PT Diagnosis: Difficulty walking;Generalized weakness  PT Problem List: Decreased strength;Decreased activity tolerance;Decreased balance;Decreased mobility PT Treatment Interventions: DME instruction;Gait training;Functional mobility training;Therapeutic  activities;Therapeutic exercise     PT Goals(Current goals can be found in the care plan section) Acute Rehab PT Goals Patient Stated Goal: to get better PT Goal Formulation: With patient Time For Goal Achievement: 11/02/13 Potential to Achieve Goals: Fair  Visit Information  Last PT Received On: 10/26/13 Assistance Needed: +2 (for OOB mobility) History of Present Illness: Pt admitted with diarrehea and weakness. She resides at the [place] expects to be discharged to:: Assisted living Additional Comments: will need SNF Prior Function Level of Independence: Needs assistance Gait / Transfers Assistance Needed: uses RW, recently used w/c due to onset of weakness ADL's / Homemaking Assistance Needed: assist for bathing Comments: amb or transfers to dinning hall Communication Communication: No difficulties Dominant Hand: Right    Cognition  Cognition Arousal/Alertness: Awake/alert Behavior During Therapy: Flat affect Overall Cognitive Status: Within Functional Limits for tasks assessed (pt with delayed response time but appropriate)    Extremity/Trunk Assessment Upper Extremity Assessment Upper Extremity Assessment: Generalized weakness Lower Extremity Assessment Lower Extremity Assessment: Generalized weakness Cervical / Trunk Assessment Cervical / Trunk Assessment: Kyphotic   Balance Balance Overall balance assessment: Needs assistance Sitting-balance support: Feet supported;Bilateral upper extremity supported Sitting balance-Leahy Scale: Fair Sitting balance - Comments: requires use of UEs, pt fatigue with difficulty maintaining cervical ext General Comments General comments (skin integrity, edema, etc.): OOB mobility limited by technician coming to complete cardioecho  End of Session PT - End of Session Equipment Utilized During Treatment: Gait belt Activity Tolerance: Patient limited by fatigue;Patient  limited by lethargy Patient left: with call bell/phone within reach;in bed ([place] technician present ) Nurse Communication: Mobility status (  pt needs breakfast re-heated post [place])  GP     Kingsley Callander 10/26/2013, 8:42 AM  Kittie Plater, PT, DPT Pager #: [phone] Office #: [phone]

## 2013-10-26 NOTE — H&P (Signed)
  Date: 10/26/2013  Patient name: SALOTE WEIDMANN  Medical record number: [id number]  Date of birth: 1930-03-28   I have seen and evaluated Danise Mina and discussed their care with the Residency Team.  Briefly, Ms. Decker is an 80 yo woman with PMH of HTN, HLD, Hypothyroidism, recurrent TIA/Stroke, GCA on steroids, recurrent UTIs who presents for weakness.  She was noted to also have decreased PO intake and diarrhea.  This weakness was confirmed by staff at her ALF as well as symptoms including burning with urination and groin pain, however, she denies these at this time.  She has a hospitalization within the last year for E.coli UTI and Cdiff colitis.  Labwork revealed a Cr of 1.17, WBC of 7.3, pro BNP of 1044.  UA showed mod hgb, 30 prot and Large LE.  CXR was concerning for pulmonary vascular congestion and edema.  Lungs sound clear on exam, no rales noted.  + 1+ edema to bilateral ankles.   Assessment and Plan: I have seen and evaluated the patient as outlined above. I agree with the formulated Assessment and Plan as detailed in the residents' admission note, with the following changes:   1. Generalized weakness, likely infection - Concern for UTI vs. Colitis as she did have diarrhea.  She has had no recent Abx.  There was a break out of flu at her ALF, however, she does not have classic symptoms - Check stool for Cdiff, influenza PCR, urine cultures are pending - Patient started on Rocephin by the ED which will be continued  2. H/O diastolic CHF with pulmonary vascular congestion - Will repeat a TTE today - Monitor strict I/O's.    Other issues are chronic and noted in resident note.  - If EF decreased, can consider starting appropriate CHF management.   Sid Falcon, MD 1/31/201511:13 AM

## 2013-10-26 NOTE — ED Provider Notes (Signed)
CSN: [id number]     Arrival date & time 10/25/13  0940 History   First MD Initiated Contact with Patient 10/25/13 1012     Chief Complaint  Patient presents with  . Diarrhea  . Nausea  . Weakness   (Consider location/radiation/quality/duration/timing/severity/associated sxs/prior Treatment) HPI Comments: [name] is a 80 year old woman with past medical history of hypertension, hyperlipidemia,  hypothyroidism, recurrent TIAs, stroke, NPH, giant cell arteritis on corticsteroids, deconditioning, bacterial meningitis, recurrent UTI's, and depression who presents with generalized weakness of 2 day duration.        Per assisted living facility, yesterday she was lethargic and weak and unable to ambulate with walker. She was having to use her wheelchair in the past 2 days. She slipped from her wheelchair a few days due to weakness with no head trauma.  She was also reported to not being her normal self and less verbal with decreased PO intake.   Pt reports loose stools on and off for the last week with no blood in stool. Pt with urinary incontinence at baseline with reported burning with urination and groin pain. There is a c-diff outbreak as well at the nursing home.  Patient is a 80 y.o. female presenting with diarrhea and weakness. The history is provided by the patient.  Diarrhea Associated symptoms: abdominal pain   Associated symptoms: no fever and no vomiting   Weakness Associated symptoms include abdominal pain. Pertinent negatives include no chest pain and no shortness of breath.    Past Medical History  Diagnosis Date  . Peripheral neuropathy   . Hypertension   . TIA (transient ischemic attack)   . Elevated sed rate   . Temporal arteritis     chronic steroids  . Hypothyroidism   . Colon cancer     colon  . History of hernia repair   . S/P rotator cuff surgery     right  . Diastolic CHF   . Cardiomegaly - hypertensive   . Chronic anemia   . Chronic steroid use   .  Dementia   . Shortness of breath   . Hyperlipidemia   . CHF (congestive heart failure)   . Cardiomyopathy due to hypertension   . Dependence on supplemental oxygen   . Chronic UTI   . Dementia   . Giant cell arteritis    Past Surgical History  Procedure Laterality Date  . Tonsillectomy  1939  . Colectomy  1987  . Abdominal hysterectomy  1974  . Appendectomy  1947  . Hernia repair  1990  . Cholecystectomy  2000  . Rotator cuff repair  2004  . Right temporal artery biopsy  10/1998, 10/2009  . Right cataract extraction  2011  . Eye surgery Bilateral   . Lumbar puncture  10/07/2004   Family History  Problem Relation Age of Onset  . Dementia Mother   . Heart attack Father    History  Substance Use Topics  . Smoking status: Never Smoker   . Smokeless tobacco: Never Used  . Alcohol Use: No   OB History   Grav Para Term Preterm Abortions TAB SAB Ect Mult Living                 Review of Systems  Constitutional: Positive for activity change. Negative for fever.  HENT: Negative for facial swelling.   Respiratory: Negative for cough, shortness of breath and wheezing.   Cardiovascular: Negative for chest pain.  Gastrointestinal: Positive for nausea, abdominal pain and diarrhea. Negative  for vomiting, constipation, blood in stool and abdominal distention.  Genitourinary: Positive for dysuria. Negative for hematuria and difficulty urinating.  Musculoskeletal: Negative for neck pain.  Skin: Negative for color change.  Neurological: Positive for weakness. Negative for speech difficulty.  Hematological: Does not bruise/bleed easily.  Psychiatric/Behavioral: Negative for confusion.    Allergies  Apap; Ceftriaxone; Ciprofloxacin; Codeine; Demerol; Metronidazole; Morphine and related; Nitrofurantoin monohyd macro; Pregabalin; and Septra  Home Medications  No current outpatient prescriptions on file. BP 147/58  Pulse 79  Temp(Src) 99.1 F (37.3 C) (Oral)  Resp 18  Ht 5\' 10"   (1.778 m)  Wt 208 lb 3.2 oz (94.439 kg)  BMI 29.87 kg/m2  SpO2 97% Physical Exam  Nursing note and vitals reviewed. Constitutional: She is oriented to person, place, and time. She appears well-developed and well-nourished.  HENT:  Head: Normocephalic and atraumatic.  Eyes: EOM are normal. Pupils are equal, round, and reactive to light.  Neck: Neck supple.  Cardiovascular: Normal rate, regular rhythm and normal heart sounds.   No murmur heard. Pulmonary/Chest: Effort normal. No respiratory distress. She has no wheezes.  Slightly tachypneic  Abdominal: Soft. She exhibits no distension. There is tenderness. There is no rebound and no guarding.  Right sided abd tenderness, suprapubic tenderness  Neurological: She is alert and oriented to person, place, and time. No cranial nerve deficit.  Skin: Skin is warm and dry.    ED Course  Procedures (including critical care time) Labs Review Labs Reviewed  CBC WITH DIFFERENTIAL - Abnormal; Notable for the following:    RBC 3.78 (*)    Hemoglobin 10.8 (*)    HCT 32.8 (*)    All other components within normal limits  COMPREHENSIVE METABOLIC PANEL - Abnormal; Notable for the following:    Sodium 134 (*)    Glucose, Bld 112 (*)    Creatinine, Ser 1.17 (*)    Albumin 2.5 (*)    Total Bilirubin 0.2 (*)    GFR calc non Af Amer 43 (*)    GFR calc Af Amer 49 (*)    All other components within normal limits  APTT - Abnormal; Notable for the following:    aPTT 21 (*)    All other components within normal limits  URINALYSIS, ROUTINE W REFLEX MICROSCOPIC - Abnormal; Notable for the following:    APPearance TURBID (*)    Hgb urine dipstick MODERATE (*)    Protein, ur 30 (*)    Leukocytes, UA LARGE (*)    All other components within normal limits  PRO B NATRIURETIC PEPTIDE - Abnormal; Notable for the following:    Pro B Natriuretic peptide (BNP) 1044.0 (*)    All other components within normal limits  URINE MICROSCOPIC-ADD ON - Abnormal;  Notable for the following:    Bacteria, UA MANY (*)    All other components within normal limits  BASIC METABOLIC PANEL - Abnormal; Notable for the following:    Sodium 136 (*)    GFR calc non Af Amer 46 (*)    GFR calc Af Amer 53 (*)    All other components within normal limits  CBC WITH DIFFERENTIAL - Abnormal; Notable for the following:    RBC 3.80 (*)    Hemoglobin 10.6 (*)    HCT 33.1 (*)    Neutrophils Relative % 79 (*)    All other components within normal limits  CLOSTRIDIUM DIFFICILE BY PCR  STOOL CULTURE  URINE CULTURE  PROTIME-INR  TROPONIN I  MAGNESIUM  PHOSPHORUS  INFLUENZA PANEL BY PCR (TYPE A & B, H1N1)   Imaging Review Dg Chest Port 1 View  10/25/2013   CLINICAL DATA:  Diarrhea, weakness, nausea, congestion, history hypertension, TIA, CHF, dementia  EXAM: PORTABLE CHEST - 1 VIEW  COMPARISON:  Portable exam 4174 hr compared to 07/19/2013  FINDINGS: Enlargement of cardiac silhouette with pulmonary vascular congestion.  Diffuse infiltrates bilaterally, question pulmonary edema or infection.  Calcified mildly tortuous thoracic aorta.  Small left pleural effusion.  No pneumothorax.  Bones demineralized.  IMPRESSION: Enlargement of cardiac silhouette with pulmonary vascular congestion and diffuse bilateral pulmonary infiltrates favor pulmonary edema over infection.   Electronically Signed   By: Lavonia Dana M.D.   On: 10/25/2013 16:02      MDM   1. Complicated UTI (urinary tract infection)    Pt comes in with cc of confusion. She is immunocompromised, lives at a nursing home, and has hx of complicated uti. Exam shows lower quadrant tenderness, VSS and CBC is normal.  Pt given iv hydration, c-diff colitis ordered. However, no BM in the ED. Pt's lytes are normal - no AKI.  We tried to ambulate the patient - but she couldn't. The assisted living cannot give iv antibiotics, and sh might be too sick for assisted living given inability to stand up. Will admit.  Varney Biles, MD 10/26/13 [phone]

## 2013-10-27 DIAGNOSIS — A498 Other bacterial infections of unspecified site: Secondary | ICD-10-CM | POA: Diagnosis present

## 2013-10-27 LAB — BASIC METABOLIC PANEL
BUN: 12 mg/dL (ref 6–23)
CHLORIDE: 100 meq/L (ref 96–112)
CO2: 23 meq/L (ref 19–32)
Calcium: 8.2 mg/dL — ABNORMAL LOW (ref 8.4–10.5)
Creatinine, Ser: 1.13 mg/dL — ABNORMAL HIGH (ref 0.50–1.10)
GFR calc Af Amer: 51 mL/min — ABNORMAL LOW (ref >90)
GFR, EST NON AFRICAN AMERICAN: 44 mL/min — AB (ref >90)
GLUCOSE: 100 mg/dL — AB (ref 70–99)
POTASSIUM: 4.2 meq/L (ref 3.7–5.3)
SODIUM: 137 meq/L (ref 137–147)

## 2013-10-27 LAB — CBC
HEMATOCRIT: 30.7 % — AB (ref 36.0–46.0)
HEMOGLOBIN: 9.8 g/dL — AB (ref 12.0–15.0)
MCH: 27.7 pg (ref 26.0–34.0)
MCHC: 31.9 g/dL (ref 30.0–36.0)
MCV: 86.7 fL (ref 78.0–100.0)
Platelets: 259 10*3/uL (ref 150–400)
RBC: 3.54 MIL/uL — AB (ref 3.87–5.11)
RDW: 14.9 % (ref 11.5–15.5)
WBC: 8.9 10*3/uL (ref 4.0–10.5)

## 2013-10-27 LAB — GLUCOSE, CAPILLARY: Glucose-Capillary: 94 mg/dL (ref 70–99)

## 2013-10-27 LAB — MRSA PCR SCREENING: MRSA by PCR: NEGATIVE

## 2013-10-27 MED ORDER — FUROSEMIDE 10 MG/ML IJ SOLN
20.0000 mg | Freq: Once | INTRAMUSCULAR | Status: AC
  Administered 2013-10-27: 20 mg via INTRAVENOUS

## 2013-10-27 NOTE — Progress Notes (Signed)
Pt alert and oriented times 4, able to communicate needs, NAD noted.

## 2013-10-27 NOTE — Progress Notes (Signed)
Subjective: Pt seen and examined in AM. No acute events overnight. Pt reports she is feeling better with improved weakness but still easily fatigued. She denies fever, chills, cough, or chest pain. SOB slightly improved from yesterday. No more diarrhea since yesterday.   Objective: Vital signs in last 24 hours: Filed Vitals:   10/26/13 1400 10/26/13 2209 10/27/13 0404 10/27/13 1008  BP: 142/57 174/52 138/50 137/47  Pulse: 84 79 84 73  Temp: 97.9 F (36.6 C) 98.5 F (36.9 C) 99.4 F (37.4 C) 97.9 F (36.6 C)  TempSrc: Oral Oral Oral Oral  Resp: 18 18 18 18   Height:      Weight:   208 lb 11.2 oz (94.666 kg)   SpO2: 93% 97% 95% 100%   Weight change: 8 lb 11.2 oz (3.946 kg)  Intake/Output Summary (Last 24 hours) at 10/27/13 1644 Last data filed at 10/27/13 1014  Gross per 24 hour  Intake    483 ml  Output      0 ml  Net    483 ml   Constitutional: She appears well-developed and well-nourished.  Head: Normocephalic and atraumatic.  Eyes: EOMI intact. PERRL. No scleral icterus. Xanthoma above eyes  Neck: No appreciable JVD Cardiovascular: RRR Pulmonary/Chest: Effort normal and breath sounds normal. No respiratory distress. +wheezing. No rales appreciable.  Abdominal: Soft. Bowel sounds are normal. There is no tenderness. There is no rebound and no guarding.  Musculoskeletal: Normal range of motion. She exhibits trace to +1 BLE pitting edema Neurological: No cranial nerve deficit. No focal deficits. Alert and orienated to person and place. Normal strength 5/5 throughout. Normal sensation to light touch Skin: Skin is warm and dry.   Lab Results: Basic Metabolic Panel:  Recent Labs Lab 10/25/13 1023 10/26/13 0315 10/27/13 0427  NA 134* 136* 137  K 3.9 4.3 4.2  CL 97 100 100  CO2 25 21 23   GLUCOSE 112* 99 100*  BUN 13 12 12   CREATININE 1.17* 1.10 1.13*  CALCIUM 8.7 8.5 8.2*  MG 1.7  --   --   PHOS 2.6  --   --    Liver Function Tests:  Recent Labs Lab  10/25/13 1023  AST 15  ALT 15  ALKPHOS 87  BILITOT 0.2*  PROT 6.9  ALBUMIN 2.5*   CBC:  Recent Labs Lab 10/25/13 1023 10/26/13 0315 10/27/13 0427  WBC 7.3 9.2 8.9  NEUTROABS 5.6 7.2  --   HGB 10.8* 10.6* 9.8*  HCT 32.8* 33.1* 30.7*  MCV 86.8 87.1 86.7  PLT 261 267 259   Cardiac Enzymes:  Recent Labs Lab 10/25/13 1023  TROPONINI <0.30   BNP:  Recent Labs Lab 10/25/13 1023  PROBNP 1044.0*   CBG:  Recent Labs Lab 10/26/13 0751 10/27/13 0638  GLUCAP 100* 94   Coagulation:  Recent Labs Lab 10/25/13 1023  LABPROT 13.5  INR 1.05   Urinalysis:  Recent Labs Lab 10/25/13 1056  COLORURINE YELLOW  LABSPEC 1.016  PHURINE 5.5  GLUCOSEU NEGATIVE  HGBUR MODERATE*  BILIRUBINUR NEGATIVE  KETONESUR NEGATIVE  PROTEINUR 30*  UROBILINOGEN 0.2  NITRITE NEGATIVE  LEUKOCYTESUR LARGE*     Micro Results: Recent Results (from the past 240 hour(s))  URINE CULTURE     Status: None   Collection Time    10/25/13 10:56 AM      Result Value Range Status   Specimen Description URINE, CATHETERIZED   Final   Special Requests NONE   Final   Culture  Setup Time  Final   Value: 10/25/2013 14:30     Performed at [facility] Count     Final   Value: >=100,000 COLONIES/ML     Performed at [facility]   Culture     Final   Value: Multiple bacterial morphotypes present, none predominant. Suggest appropriate recollection if clinically indicated.     Performed at [facility]   Report Status 10/26/2013 FINAL   Final  CLOSTRIDIUM DIFFICILE BY PCR     Status: Abnormal   Collection Time    10/25/13  9:41 PM      Result Value Range Status   C difficile by pcr POSITIVE (*) NEGATIVE Final   Comment: CRITICAL RESULT CALLED TO, READ BACK BY AND VERIFIED WITH:     Claudette Head [id number] 10/26/13 SCALES H  STOOL CULTURE     Status: None   Collection Time    10/25/13  9:41 PM      Result Value Range Status   Specimen Description STOOL   Final    Special Requests NONE   Final   Culture     Final   Value: NO SUSPICIOUS COLONIES, CONTINUING TO HOLD     Performed at [facility]   Report Status PENDING   Incomplete  MRSA PCR SCREENING     Status: None   Collection Time    10/26/13 11:46 PM      Result Value Range Status   MRSA by PCR NEGATIVE  NEGATIVE Final   Comment:            The GeneXpert MRSA Assay (FDA     approved for NASAL specimens     only), is one component of a     comprehensive MRSA colonization     surveillance program. It is not     intended to diagnose MRSA     infection nor to guide or     monitor treatment for     MRSA infections.   Studies/Results: No results found. Medications: I have reviewed the patient's current medications. Scheduled Meds: . amLODipine  5 mg Oral q morning - 10a  . dipyridamole-aspirin  1 capsule Oral BID  . donepezil  10 mg Oral QHS  . ferrous sulfate  325 mg Oral Q breakfast  . hydrALAZINE  10 mg Oral Q8H  . levothyroxine  150 mcg Oral QAC breakfast  . pantoprazole  80 mg Oral Daily  . predniSONE  5 mg Oral Q breakfast  . saccharomyces boulardii  250 mg Oral BID  . sertraline  50 mg Oral q morning - 10a  . sodium chloride  3 mL Intravenous Q12H  . vancomycin  125 mg Oral Q6H   Continuous Infusions:  PRN Meds:.ondansetron (ZOFRAN) IV, ondansetron Assessment/Plan: 80 year old woman with past medical history of hypertension, hyperlipidemia, hypothyroidism, recurrent TIAs, stroke, NPH, giant cell arteritis on corticsteroids, deconditioning, bacterial meningitis, recurrent UTI's, and depression who presents with generalized weakness of 2 day duration and found to have UTI and c diff colitis on admission.    Generalized Weakness, likely from infection: Pt with 2-day history of generalized weakness, lethargy, decreased PO intake, loose stools, nasal congestion, and headache who presented with fever 100.5 F without other criteria for sepsis. Etiology most likely  multifactorial due to c. Diff colitis, UTI, decreased PO intake, recent decrease in steroid dosage, and deconditioning. Influenza panel negative -Obtain influenza virus panel --> negative D/C tamiflu 30 mg BID (received 1 day)  -Obtain  c. diff --> positive, administer PO vancomycin -Awaiting urine cultures -->100K multiple bacterial morphotypes suggesting contaminated sample -Awaiting stool cultures  -D/C IV 1g ceftriaxone, administer fosfomycin for UTI  -Consider increasing corticosteroid dose (recently changed from 10-15 mg to 5 mg) -Consider outpatient rheumatology for evaluation of  polymyalgia rheumatica (with h/o temporal arteritis and recent elevated CRP and ESR) -Obtain PT consult --> recommend SNF; 24-hr supervision   Recurrent c. diff colitis: Currently without diarrhea, but with a 1 week history of loose stools with chronic c. Diff infection. C. Diff positive on admission. Because this is a recurrent infection for the patient, po Vancomycin was started 1/31.  - Continue vancomycin 125 mg x4 daily, Day 2/14 - Contact precautions  Uncomplicated UTI: Pt with history of urinary incontinence and recurrent UTI's. Pt with +LE on UA, awaiting culture with >100K colonies with multiple bacterial morphotypes suggesting contaminated sample. Pt received IV 1g ceftriaxone x1 dose, which was changed to fosfomycin x1 dose given her abx allergies and questionable UTI.  Diastolic Grade 2 Congestive Heart Failure: Pt with 2D-echo of 05/13/12 with EF 55-60% and grade 2 diastolic dysfunction. 2D-echo on 10/12/12 with EF 27-25% with no diastolic dysfunction. Pt with elevated pro-BNP 1044 and chest xray on 1/30 with enlargement of cardiac silhouette with pulmonary vascular congestion and diffuse bilateral pulmonary infiltrates suggesting pulmonary edema over infection. Also with trace to +1 bilateral pitting LE edema.  2D-Echo 1/31 w/ EF 65-70% with grade 2 diastolic dysfunction. She still appears to have some  additional volume on board. Weight stable from yesterday but up 5lbs from 1/30. Will treat with small dose of IV Lasix. - Lasix 25m IV x1 - am CXR - Monitor volume status with daily weights (203 to 208 lb) and strict I&O's   Giant Cell Temporal Arteritis: Currently asymptomatic. Pt w/o headache or vision changes. Pt with temporal arteritis with positive left temporal artery biopsy in March 2000. Pt with elevated ESR 62 and CRP 61.8 on 10/21/13.  -Continue home 5 mg prednisone daily, consider increasing dose, as home dose recently increased to 10-15 mg daily.  Hypertension: Stable -Continuing home amlodipine 5 mg daily  -Continuing home hydralazine 10 mg TID   Hyponatremia: Resolved. Pt with Na of 134 on admission most likely due to hypovolemia in setting of decreased PO intake and GI loss (diarrhea).  -Regular diet  -Encourage adequate hydration   Normocytic Anemia: Stable. Pt with Hg on admission of 10.8 with baseline of Hg 10-12.   H/o CVA: Pt with history of right-sided stroke in February 2000.  -Continue home aggrenox 200-25 BID   Hypothyroidism: Pt with last TSH of 0.01 on 01/23/13  -Continue home levothyroxine 150 mcg daily   Depression: Stable  -Continue home donepezil 10 mg daily  -Continue home sertraline 50 mg daily   GERD: Currently without reflux symptoms  -Continue protonix 80 mg daily   Diet: Regular  DVT PPx: SCD's  Code: Full  Dispo: Disposition is deferred at this time, awaiting improvement of current medical problems.  Anticipated discharge in approximately 2-3 day(s).   The patient does have a current PCP (SSande Brothers MD) and [facility].  The patient does have transportation limitations that hinder transportation to clinic appointments.  .Services Needed at time of discharge: Y = Yes, Blank = No PT:   OT:   RN:   Equipment:   Other:     LOS: 2 days   KOtho Bellows MD  10/27/2013, 4:44  PM

## 2013-10-28 ENCOUNTER — Inpatient Hospital Stay (HOSPITAL_COMMUNITY): Payer: Medicare Other

## 2013-10-28 DIAGNOSIS — N390 Urinary tract infection, site not specified: Secondary | ICD-10-CM

## 2013-10-28 DIAGNOSIS — B9689 Other specified bacterial agents as the cause of diseases classified elsewhere: Secondary | ICD-10-CM

## 2013-10-28 DIAGNOSIS — A499 Bacterial infection, unspecified: Secondary | ICD-10-CM

## 2013-10-28 LAB — GLUCOSE, CAPILLARY: Glucose-Capillary: 91 mg/dL (ref 70–99)

## 2013-10-28 NOTE — Care Management Note (Addendum)
  Page 1 of 1   10/31/2013     12:47:11 PM   CARE MANAGEMENT NOTE 10/31/2013  Patient:  [name], [name]   Account Number:  [id number]  Date Initiated:  10/28/2013  Documentation initiated by:  Marvette Schamp  Subjective/Objective Assessment:   Admitted with generalized weakness; r/o cdiff and flu     Action/Plan:   CM will follow for disposition needs   Anticipated DC Date:  10/31/2013   Anticipated DC Plan:  SKILLED NURSING FACILITY  In-house referral  Clinical Social Worker      DC Planning Services  CM consult      Choice offered to / List presented to:             Status of service:  Completed, signed off Medicare Important Message given?   (If response is "NO", the following Medicare IM given date fields will be blank) Date Medicare IM given:   Date Additional Medicare IM given:    Discharge Disposition:  [place]  Per UR Regulation:  Reviewed for med. necessity/level of care/duration of stay  If discussed at [place] of Stay Meetings, dates discussed:    Comments:  10/31/2013 Socail:  Patient from [place] IV ABX and fluids PT Recs:  SNF / 24 hour supervistion DME Recs:  None Disposition:  Return to [facility] (pending Neg C-diff cultures / SNF will not accept until CX back). [id number] RN, BSN, [place], [place] ([place] [phone] 10/28/2013

## 2013-10-28 NOTE — Progress Notes (Signed)
Physical Therapy Treatment Patient Details Name: [name]: [id number] DOB: 07-16-1930 Today's Date: 10/28/2013 Time: 1133-1201 PT Time Calculation (min): 28 min  PT Assessment / Plan / Recommendation  History of Present Illness Pt admitted with diarrehea and weakness. She resides at the [place]   PT Comments   Pt with excellent progression from eval able to stand and ambulate short distance today. Dgtr present throughout stating pt with only limited mobility at baseline with variation between needing 1 and 2 person assist to get to transport chair and only very limited ambulation at baseline. Will continue to follow to maximize mobility and function. Encouraged OOB daily and for pt to continue HEP.   Follow Up Recommendations  SNF;Supervision/Assistance - 24 hour     Does the patient have the potential to tolerate intense rehabilitation     Barriers to Discharge        Equipment Recommendations       Recommendations for Other Services    Frequency     Progress towards PT Goals Progress towards PT goals: Goals met and updated - see care plan  Plan Current plan remains appropriate    Precautions / Restrictions Precautions Precautions: Fall Restrictions Weight Bearing Restrictions: No   Pertinent Vitals/Pain 95% on 2L throughout HR 84 No pain    Mobility  Bed Mobility Overal bed mobility: Needs Assistance Bed Mobility: Rolling;Sidelying to Sit Rolling: Min assist Sidelying to sit: Min assist General bed mobility comments: cues for sequence, use of rail and assist to scoot to EOB with HOB elevated grossly 25degrees Transfers Overall transfer level: Needs assistance Equipment used: Rolling walker (2 wheeled) Transfers: Sit to/from [facility] Sit to Stand: Min assist;Mod assist Stand pivot transfers: Min assist General transfer comment: pt able to stand from bed with min assist and pivot to recliner. Stood again from recliner with min  assist to ambulate but on 3rd standing trial mod assist to stand with increased fatigue and only able to stand 5 sec prior to sitting Ambulation/Gait Ambulation/Gait assistance: Min assist Ambulation Distance (Feet): 6 Feet Assistive device: Rolling walker (2 wheeled) Gait Pattern/deviations: Step-through pattern;Decreased stride length Gait velocity interpretation: <1.8 ft/sec, indicative of risk for recurrent falls General Gait Details: chair pulled behind for pt safety with cueing for posture and position in RW    Exercises General Exercises - Lower Extremity Ankle Circles/Pumps: AROM;Seated;Both;15 reps Long Arc Quad: AROM;Seated;Both;15 reps Hip ABduction/ADduction: AROM;Seated;Both;10 reps Hip Flexion/Marching: AROM;Seated;Both;15 reps   PT Diagnosis:    PT Problem List:   PT Treatment Interventions:     PT Goals (current goals can now be found in the care plan section)    Visit Information  Last PT Received On: 10/28/13 Assistance Needed: +1 History of Present Illness: Pt admitted with diarrehea and weakness. She resides at the [place]: Awake/alert Behavior During Therapy: Flat affect Overall Cognitive Status: History of cognitive impairments - at baseline    Balance     End of Session PT - End of Session Equipment Utilized During Treatment: Gait belt Activity Tolerance: Patient limited by fatigue Patient left: in chair;with call bell/phone within [place]   GP     [name] 10/28/2013, 12:10 PM [name], [place]

## 2013-10-28 NOTE — Progress Notes (Signed)
Internal Medicine Attending  Date: 10/28/2013  Patient name: [name] Medical record number: [id number] Date of birth: [birth year] Age: 80 y.o. Gender: female  I saw and evaluated the patient. I reviewed the resident's note by Dr. Ronnald Ramp and I agree with the resident's findings and plans as documented in his progress note.  She's had no bouts of diarrhea for nearly 48 hours. She feels markedly improved, and her daughter, who is visiting, feels she is at her baseline. At this point we will continue a 14 day course of oral vancomycin for her recurrent C. difficile. We will also continue treating her other chronic medical conditions with her outpatient regimen. At this point we're waiting for placement back in her assisted living facility and we are hopeful this can be accomplished within the next 24 hours.

## 2013-10-28 NOTE — Progress Notes (Signed)
Subjective: Patient seen at bedside this AM. No complaints today. Says she is feeling better. Able to answer questions appropriately. Denies any abdominal pain, nausea, vomiting, fever, or chills. On O2 via [place].  Objective: Vital signs in last 24 hours: Filed Vitals:   10/28/13 0501 10/28/13 1050 10/28/13 1206 10/28/13 1334  BP: 157/61 136/53  149/59  Pulse: 83 70 84 79  Temp:    97.6 F (36.4 C)  TempSrc: Oral   Oral  Resp: 18   18  Height:      Weight: 207 lb 10.8 oz (94.2 kg)     SpO2: 95%  95% 95%   Weight change: -1 lb 0.4 oz (-0.466 kg)  Intake/Output Summary (Last 24 hours) at 10/28/13 1557 Last data filed at 10/28/13 1334  Gross per 24 hour  Intake    960 ml  Output      0 ml  Net    960 ml   Constitutional: Elderly female, NAD. Awake, alert, cooperative w/ exam.  Head: Normocephalic and atraumatic.  Eyes: EOMI intact. PERRL. No scleral icterus. Xanthoma above eyes  Neck: Supple, trachea midline, no JVD. Cardiovascular: RRR. No murmurs, gallops, or rubs. Pulmonary/Chest: Effort normal and breath sounds normal. No respiratory distress. Mild wheezing present. No rales appreciable.  Abdominal: Soft. Bowel sounds are normal. There is no tenderness. There is no rebound and no guarding.  Musculoskeletal: Normal range of motion. +1 pitting edema.  Neurological: No cranial nerve deficit. No focal deficits. Alert and orienated to person and place. Normal strength 5/5 throughout. Normal sensation to light touch Skin: Skin is warm and dry. No erythema, rashes, or ulcerations.   Lab Results: Basic Metabolic Panel:  Recent Labs Lab 10/25/13 1023 10/26/13 0315 10/27/13 0427  NA 134* 136* 137  K 3.9 4.3 4.2  CL 97 100 100  CO2 _0 GLUCOSE 112* 99 100*  BUN _1 CREATININE 1.17* 1.10 1.13*  CALCIUM 8.7 8.5 8.2*  MG 1.7  --   --   PHOS 2.6  --   --    Liver Function Tests:  Recent Labs Lab 10/25/13 1023  AST 15  ALT 15  ALKPHOS 87  BILITOT 0.2*    PROT 6.9  ALBUMIN 2.5*   CBC:  Recent Labs Lab 10/25/13 1023 10/26/13 0315 10/27/13 0427  WBC 7.3 9.2 8.9  NEUTROABS 5.6 7.2  --   HGB 10.8* 10.6* 9.8*  HCT 32.8* 33.1* 30.7*  MCV 86.8 87.1 86.7  PLT 261 267 259   Cardiac Enzymes:  Recent Labs Lab 10/25/13 1023  TROPONINI <0.30   BNP:  Recent Labs Lab 10/25/13 1023  PROBNP 1044.0*   CBG:  Recent Labs Lab 10/26/13 0751 10/27/13 0638 10/28/13 0750  GLUCAP 100* 94 91   Coagulation:  Recent Labs Lab 10/25/13 1023  LABPROT 13.5  INR 1.05   Urinalysis:  Recent Labs Lab 10/25/13 1056  COLORURINE YELLOW  LABSPEC 1.016  PHURINE 5.5  GLUCOSEU NEGATIVE  HGBUR MODERATE*  BILIRUBINUR NEGATIVE  KETONESUR NEGATIVE  PROTEINUR 30*  UROBILINOGEN 0.2  NITRITE NEGATIVE  LEUKOCYTESUR LARGE*   Micro Results: Recent Results (from the past 240 hour(s))  URINE CULTURE     Status: None   Collection Time    10/25/13 10:56 AM      Result Value Range Status   Specimen Description URINE, CATHETERIZED   Final   Special Requests NONE   Final   Culture  Setup Time  Final   Value: 10/25/2013 14:30     Performed at [facility]     Final   Value: >=100,000 COLONIES/ML     Performed at [facility]   Culture     Final   Value: Multiple bacterial morphotypes present, none predominant. Suggest appropriate recollection if clinically indicated.     Performed at [facility]   Report Status 10/26/2013 FINAL   Final  CLOSTRIDIUM DIFFICILE BY PCR     Status: Abnormal   Collection Time    10/25/13  9:41 PM      Result Value Range Status   C difficile by pcr POSITIVE (*) NEGATIVE Final   Comment: CRITICAL RESULT CALLED TO, READ BACK BY AND VERIFIED WITH:     Claudette Head [id number] 10/26/13 SCALES H  STOOL CULTURE     Status: None   Collection Time    10/25/13  9:41 PM      Result Value Range Status   Specimen Description STOOL   Final   Special Requests NONE   Final   Culture      Final   Value: NO SUSPICIOUS COLONIES, CONTINUING TO HOLD     Performed at [facility]   Report Status PENDING   Incomplete  MRSA PCR SCREENING     Status: None   Collection Time    10/26/13 11:46 PM      Result Value Range Status   MRSA by PCR NEGATIVE  NEGATIVE Final   Comment:            The GeneXpert MRSA Assay (FDA     approved for NASAL specimens     only), is one component of a     comprehensive MRSA colonization     surveillance program. It is not     intended to diagnose MRSA     infection nor to guide or     monitor treatment for     MRSA infections.   Studies/Results: Dg Chest 2 View  10/28/2013   CLINICAL DATA:  CHF, shortness of breath,  EXAM: CHEST  2 VIEW  COMPARISON:  DG CHEST 1V PORT dated 10/25/2013  FINDINGS: Low lung volumes. The cardiac silhouette is enlarged. There is prominence of interstitial markings, areas peribronchial cuffing, and indistinctness of the pulmonary vasculature. Areas of increased density projects within the lung bases. Blunting of the left costophrenic angle. A osseous structures are unremarkable.  IMPRESSION: Improved interstitial infiltrate consistent with decreased pulmonary edema. Areas of asymmetric edema versus non edematous infiltrates in lung bases left greater than right as well as possibly a very small left effusion.   Electronically Signed   By: Margaree Mackintosh M.D.   On: 10/28/2013 11:35   Medications: I have reviewed the patient's current medications. Scheduled Meds: . amLODipine  5 mg Oral q morning - 10a  . dipyridamole-aspirin  1 capsule Oral BID  . donepezil  10 mg Oral QHS  . ferrous sulfate  325 mg Oral Q breakfast  . hydrALAZINE  10 mg Oral Q8H  . levothyroxine  150 mcg Oral QAC breakfast  . predniSONE  5 mg Oral Q breakfast  . saccharomyces boulardii  250 mg Oral BID  . sertraline  50 mg Oral q morning - 10a  . sodium chloride  3 mL Intravenous Q12H  . vancomycin  125 mg Oral Q6H   Continuous Infusions:  PRN  Meds:.ondansetron (ZOFRAN) IV, ondansetron  Assessment/Plan: 80 year old woman with past  medical history of hypertension, hyperlipidemia, hypothyroidism, recurrent TIAs, stroke (on aggrenox), NPH, giant cell arteritis (on chronic steroids), deconditioning, bacterial meningitis, recurrent UTI's, and depression who presents with generalized weakness, found to have UTI and c diff colitis on admission.    Generalized Weakness: Pt with 2-day history of generalized weakness, lethargy, decreased PO intake, loose stools, nasal congestion, and headache, presented with fever 100.5 F without other criteria for sepsis. Etiology most likely multifactorial due to c. Diff colitis, UTI, decreased PO intake, recent decrease in steroid dosage, and deconditioning.  - Flu panel negative - C. Diff +ve; Continue po Vancomycin (total of 14 days; today is day 3) - Urine cultures show 100,000 colonies of multiple bacterial morphotypes suggesting contaminated sample - Stool cultures -ve to date - Continue Prednisone 5 mg po qd - Consider outpatient rheumatology for evaluation of polymyalgia rheumatica (with h/o temporal arteritis and recent elevated CRP and ESR) - PT consult recommended SNF placement. Bed search in progress as per SW  Recurrent c. diff colitis: Currently without diarrhea, but with a 1 week history of loose stools with chronic C. Diff infection. C. Diff positive on admission. Because this is a recurrent infection for the patient, po Vancomycin was started 1/31.  - Continue vancomycin 125 mg x4 daily, Day 3/14 - Contact precautions  Uncomplicated UTI: Pt with history of urinary incontinence and recurrent UTI's. Pt with +LE on UA. No symptoms today. - Culture with >100K colonies of multiple bacterial morphotypes suggesting contaminated sample.  - Pt received IV 1g ceftriaxone x1 dose, which was changed to fosfomycin x1 dose given her abx allergies and questionable UTI. - No further treatment at this  time.  Diastolic Grade 2 Congestive Heart Failure: Pt with 2D-echo of 05/13/12 with EF 55-60% and grade 2 diastolic dysfunction. 2D-echo on 10/12/12 with EF 72-09% with no diastolic dysfunction. Pt with elevated pro-BNP 1044 and chest xray on 1/30 with enlargement of cardiac silhouette with pulmonary vascular congestion and diffuse bilateral pulmonary infiltrates suggesting pulmonary edema over infection. Also with +1 bilateral pitting LE edema.  2D-Echo 1/31 w/ EF 65-70% with grade 2 diastolic dysfunction. Given Lasix 10m IV x1 yesterday. Net I/O +2.1 L since admission.  - Continue to monitor volume status with daily weights (203 to 208 lb) and strict I&O's   Giant Cell Temporal Arteritis: Currently asymptomatic. Pt w/o headache or vision changes. Pt with temporal arteritis with positive left temporal artery biopsy in March 2000. Pt with elevated ESR 62 and CRP 61.8 on 10/21/13.  - Continue home 5 mg prednisone daily.  Hypertension: Stable -Continuing home amlodipine 5 mg daily  -Continuing home hydralazine 10 mg TID   Normocytic Anemia: Stable. Pt with Hg on admission of 10.8 with baseline of Hg 10-12. 9.8 this AM.  - Continue to monitor  H/o CVA: Pt with history of right-sided stroke in February 2000.  - Continue home aggrenox 200-25 BID   Hypothyroidism: Pt with last TSH of 0.01 on 01/23/13  - Continue home levothyroxine 150 mcg daily   Depression: Stable  - Continue home donepezil 10 mg daily  - Continue home sertraline 50 mg daily   GERD: Currently without reflux symptoms  - Hold Protonix for now given C. Diff positive.   Diet: Regular   DVT PPx: SCD's   Code: Full  Dispo: Disposition is deferred at this time, awaiting improvement of current medical problems.  Anticipated discharge in approximately 1-2 day(s). For SNF placement, bed search underway.   The patient does have  a current PCP (Sande Brothers, MD) and does not need an [facility] hospital follow-up appointment after  discharge.  The patient does have transportation limitations that hinder transportation to clinic appointments.  .Services Needed at time of discharge: Y = Yes, Blank = No PT:   OT:   RN:   Equipment:   Other: SNF    LOS: 3 days   Corky Sox, MD 10/28/2013, 3:57 PM

## 2013-10-28 NOTE — Progress Notes (Signed)
UR completed Shamel Galyean K. Nida Manfredi, RN, BSN, [place], CCM  10/28/2013 3:55 PM

## 2013-10-28 NOTE — Clinical Documentation Improvement (Signed)
THIS DOCUMENT IS NOT A PERMANENT PART OF THE MEDICAL RECORD  10/28/13  Dear Dr. Luanne Bras,  In a better effort to capture your patient's severity of illness, reflect appropriate length of stay and utilization of resources, a review of the patient medical record has revealed the following indicators the diagnosis of Heart Failure.    Based on your clinical judgment, please clarify and document in a progress note and/or discharge summary the clinical condition associated with the following supporting information: Per Physician Progress Note on 11/26/3555,  "Diastolic Grade 2 Congestive Heart Failure: Pt with elevated pro-BNP 1044 and chest xray on 1/30 with enlargement of cardiac silhouette with pulmonary vascular congestion and diffuse bilateral pulmonary infiltrates suggesting pulmonary edema over infection. Also with trace to +1 bilateral pitting LE edema. 2-D Echo 1/31 w/ EF 65-70% with grade 2 diastolic dysfunction. She still appears to have some additional volume on board. Weight stable from yesterday but up 5lbs from 1/30. Will treat with small dose of IV Lasix.  - Lasix 20 mg IV x1  - am CXR  - Monitor volume status with daily weights (203 to 208 lb) and strict I&O's"   Possible Clinical Conditions?       Acute on Chronic Diastolic Congestive Heart Failure      Chronic Diastolic Congestive Heart Failure      Cannot Clinically Determine  Treatment:  Lasix 20 mg x 1  Reviewed:  no additional documentation provided.  Given the patient's previous dCHF as described, treating w/ Lasix, we can safely say "mild Acute on Chronic dCHF".  Thank You,  Posey Pronto, RN, BSN, [place] Documentation Improvement Specialist HIM department--[place] Office [phone]

## 2013-10-29 LAB — STOOL CULTURE

## 2013-10-29 LAB — CBC
HCT: 30.3 % — ABNORMAL LOW (ref 36.0–46.0)
HEMOGLOBIN: 9.6 g/dL — AB (ref 12.0–15.0)
MCH: 27.4 pg (ref 26.0–34.0)
MCHC: 31.7 g/dL (ref 30.0–36.0)
MCV: 86.3 fL (ref 78.0–100.0)
PLATELETS: 273 10*3/uL (ref 150–400)
RBC: 3.51 MIL/uL — AB (ref 3.87–5.11)
RDW: 14.8 % (ref 11.5–15.5)
WBC: 8 10*3/uL (ref 4.0–10.5)

## 2013-10-29 LAB — GLUCOSE, CAPILLARY: Glucose-Capillary: 87 mg/dL (ref 70–99)

## 2013-10-29 NOTE — Progress Notes (Signed)
CSW spoke to patient's daughter Neomia Glass today to discuss PT's recommendation for SNF placement. She stated that her mother has been in a SNF unit before as well as returned to the ALF directly from the hospital in the past and she feels that her mother received better, more intense care when she returned to the ALF. She feels that [facility] provides very good care and are extremely "hands on" with her mother.  CSW spoke to [place] at [facility]- she is agreeable to accept patient back tomorrow if medically stable per MD.  CSW contacted Dr. Ronnald Ramp- plan d/c back to ALF tomorrow. CSW will update Fl2 with d/c meds in the a.m and send Fl2 to facility for review.  Daughter and patient are pleased with the plan; daughter is out of town and cannot transport her mother. Facility cannot provide transportation.  Plan EMS transport to facility tomorrow if stable. Lorie Phenix. [place], [place]

## 2013-10-29 NOTE — Progress Notes (Signed)
Internal Medicine Attending  Date: 10/29/2013  Patient name: [name] Medical record number: [id number] Date of birth: 1930/04/13 Age: 80 y.o. Gender: female  I saw and evaluated the patient. I reviewed the resident's note by Dr. Ronnald Ramp and I agree with the resident's findings and plans as documented in his progress note.  She continues to do well.  We are now working on ALF placement as she is not interested in a shift. Will continue therapy for her C. Diff and other chronic medical problems.

## 2013-10-29 NOTE — Progress Notes (Signed)
Subjective:  Patient seen at bedside this AM. No new complaints. Denies diarrhea. Placement discussed with daughter, does not wish to have patient go to SNF. Will return to previous ALF.  Objective: Vital signs in last 24 hours: Filed Vitals:   10/28/13 1334 10/28/13 2127 10/29/13 0452 10/29/13 0536  BP: 149/59 135/51  144/58  Pulse: 79 71  75  Temp: 97.6 F (36.4 C) 97.9 F (36.6 C)  97.4 F (36.3 C)  TempSrc: Oral Oral  Oral  Resp: 18 18  20   Height:      Weight:   209 lb 1.6 oz (94.847 kg)   SpO2: 95% 96%  94%   Weight change: 1 lb 6.8 oz (0.647 kg)  Intake/Output Summary (Last 24 hours) at 10/29/13 9774 Last data filed at 10/28/13 2126  Gross per 24 hour  Intake    960 ml  Output      0 ml  Net    960 ml   Constitutional: Elderly female, NAD. Awake, alert, cooperative w/ exam.  Head: Normocephalic and atraumatic.  Eyes: EOMI intact. PERRL. No scleral icterus. Xanthoma above eyes  Neck: Supple, trachea midline, no JVD. Cardiovascular: RRR. No murmurs, gallops, or rubs. Pulmonary/Chest: Effort normal and breath sounds normal. No respiratory distress. Mild wheezing present. No rales appreciable.  Abdominal: Soft. Bowel sounds are normal. There is no tenderness. There is no rebound and no guarding.  Musculoskeletal: Normal range of motion. +1 pitting edema.  Neurological: No cranial nerve deficit. No focal deficits. Alert and orienated to person and place. Normal strength 5/5 throughout. Normal sensation to light touch Skin: Skin is warm and dry. No erythema, rashes, or ulcerations.   Lab Results: Basic Metabolic Panel:  Recent Labs Lab 10/25/13 1023 10/26/13 0315 10/27/13 0427  NA 134* 136* 137  K 3.9 4.3 4.2  CL 97 100 100  CO2 25 21 23   GLUCOSE 112* 99 100*  BUN 13 12 12   CREATININE 1.17* 1.10 1.13*  CALCIUM 8.7 8.5 8.2*  MG 1.7  --   --   PHOS 2.6  --   --    Liver Function Tests:  Recent Labs Lab 10/25/13 1023  AST 15  ALT 15  ALKPHOS 87    BILITOT 0.2*  PROT 6.9  ALBUMIN 2.5*   CBC:  Recent Labs Lab 10/25/13 1023 10/26/13 0315 10/27/13 0427 10/29/13 0530  WBC 7.3 9.2 8.9 8.0  NEUTROABS 5.6 7.2  --   --   HGB 10.8* 10.6* 9.8* 9.6*  HCT 32.8* 33.1* 30.7* 30.3*  MCV 86.8 87.1 86.7 86.3  PLT 261 267 259 273   Cardiac Enzymes:  Recent Labs Lab 10/25/13 1023  TROPONINI <0.30   BNP:  Recent Labs Lab 10/25/13 1023  PROBNP 1044.0*   CBG:  Recent Labs Lab 10/26/13 0751 10/27/13 0638 10/28/13 0750 10/29/13 0645  GLUCAP 100* 94 91 87   Coagulation:  Recent Labs Lab 10/25/13 1023  LABPROT 13.5  INR 1.05   Urinalysis:  Recent Labs Lab 10/25/13 1056  COLORURINE YELLOW  LABSPEC 1.016  PHURINE 5.5  GLUCOSEU NEGATIVE  HGBUR MODERATE*  BILIRUBINUR NEGATIVE  KETONESUR NEGATIVE  PROTEINUR 30*  UROBILINOGEN 0.2  NITRITE NEGATIVE  LEUKOCYTESUR LARGE*   Micro Results: Recent Results (from the past 240 hour(s))  URINE CULTURE     Status: None   Collection Time    10/25/13 10:56 AM      Result Value Range Status   Specimen Description URINE, CATHETERIZED   Final  Special Requests NONE   Final   Culture  Setup Time     Final   Value: 10/25/2013 14:30     Performed at [facility]     Final   Value: >=100,000 COLONIES/ML     Performed at [facility]   Culture     Final   Value: Multiple bacterial morphotypes present, none predominant. Suggest appropriate recollection if clinically indicated.     Performed at [facility]   Report Status 10/26/2013 FINAL   Final  CLOSTRIDIUM DIFFICILE BY PCR     Status: Abnormal   Collection Time    10/25/13  9:41 PM      Result Value Range Status   C difficile by pcr POSITIVE (*) NEGATIVE Final   Comment: CRITICAL RESULT CALLED TO, READ BACK BY AND VERIFIED WITH:     Claudette Head [id number] 10/26/13 SCALES H  STOOL CULTURE     Status: None   Collection Time    10/25/13  9:41 PM      Result Value Range Status    Specimen Description STOOL   Final   Special Requests NONE   Final   Culture     Final   Value: NO SUSPICIOUS COLONIES, CONTINUING TO HOLD     Performed at [facility]   Report Status PENDING   Incomplete  MRSA PCR SCREENING     Status: None   Collection Time    10/26/13 11:46 PM      Result Value Range Status   MRSA by PCR NEGATIVE  NEGATIVE Final   Comment:            The GeneXpert MRSA Assay (FDA     approved for NASAL specimens     only), is one component of a     comprehensive MRSA colonization     surveillance program. It is not     intended to diagnose MRSA     infection nor to guide or     monitor treatment for     MRSA infections.   Studies/Results: Dg Chest 2 View  10/28/2013   CLINICAL DATA:  CHF, shortness of breath,  EXAM: CHEST  2 VIEW  COMPARISON:  DG CHEST 1V PORT dated 10/25/2013  FINDINGS: Low lung volumes. The cardiac silhouette is enlarged. There is prominence of interstitial markings, areas peribronchial cuffing, and indistinctness of the pulmonary vasculature. Areas of increased density projects within the lung bases. Blunting of the left costophrenic angle. A osseous structures are unremarkable.  IMPRESSION: Improved interstitial infiltrate consistent with decreased pulmonary edema. Areas of asymmetric edema versus non edematous infiltrates in lung bases left greater than right as well as possibly a very small left effusion.   Electronically Signed   By: Margaree Mackintosh M.D.   On: 10/28/2013 11:35   Medications: I have reviewed the patient's current medications. Scheduled Meds: . amLODipine  5 mg Oral q morning - 10a  . dipyridamole-aspirin  1 capsule Oral BID  . donepezil  10 mg Oral QHS  . ferrous sulfate  325 mg Oral Q breakfast  . hydrALAZINE  10 mg Oral Q8H  . levothyroxine  150 mcg Oral QAC breakfast  . predniSONE  5 mg Oral Q breakfast  . saccharomyces boulardii  250 mg Oral BID  . sertraline  50 mg Oral q morning - 10a  . sodium chloride  3 mL  Intravenous Q12H  . vancomycin  125 mg Oral Q6H  Continuous Infusions:  PRN Meds:.ondansetron (ZOFRAN) IV, ondansetron  Assessment/Plan: 80 year old woman with past medical history of hypertension, hyperlipidemia, hypothyroidism, recurrent TIAs, stroke (on aggrenox), NPH, giant cell arteritis (on chronic steroids), deconditioning, bacterial meningitis, recurrent UTI's, and depression who presents with generalized weakness, found to have c diff colitis on admission.    Generalized Weakness: Pt with 2-day history of generalized weakness, lethargy, decreased PO intake, loose stools, nasal congestion, and headache, presented with fever 100.5 F without other criteria for sepsis. Etiology most likely multifactorial due to c. Diff colitis, possible UTI, decreased PO intake, recent decrease in steroid dosage, and deconditioning.  - C. Diff +ve; Continue po Vancomycin (total of 14 days; today is day 4) - Continue Prednisone 5 mg po qd - Patient to return to ALF in tomorrow AM  Recurrent c. diff colitis: Currently without diarrhea, but with a 1 week history of loose stools with chronic C. Diff infection. C. Diff positive on admission. Because this is a recurrent infection for the patient, po Vancomycin was started 1/31.  - Continue vancomycin 125 mg x4 daily, Day 4/14. No recent diarrhea. - Contact precautions  Diastolic Grade 2 Congestive Heart Failure:  2D-Echo 1/31 w/ EF 65-70% with grade 2 diastolic dysfunction. Net I/O +3.5 L since admission.  - Continue to monitor volume status with daily weights (203 to 209 lb) and strict I&O's   Giant Cell Temporal Arteritis: Currently asymptomatic. Pt w/o headache or vision changes. Pt with temporal arteritis with positive left temporal artery biopsy in March 2000. Pt with elevated ESR 62 and CRP 61.8 on 10/21/13.  - Continue home 5 mg prednisone daily.  Hypertension: Stable - Continuing home amlodipine 5 mg daily  - Continuing home hydralazine 10 mg TID    Normocytic Anemia: Stable. Pt with Hg on admission of 10.8 with baseline of Hg 10-12. 9.6 this AM.  - Continue to monitor  H/o CVA: Pt with history of right-sided stroke in February 2000.  - Continue home aggrenox 200-25 BID   Hypothyroidism: Pt with last TSH of 0.01 on 01/23/13  - Continue home levothyroxine 150 mcg daily   Depression: Stable  - Continue home donepezil 10 mg daily  - Continue home sertraline 50 mg daily   GERD: Currently without reflux symptoms  - Hold Protonix given C. Diff positive.   Diet: Regular   DVT PPx: SCD's   Code: Full  Dispo: Disposition is deferred at this time, awaiting improvement of current medical problems.  Anticipated discharge in approximately 1-2 day(s). To return to ALF in AM.  The patient does have a current PCP Sande Brothers, MD) and does not need an [facility] hospital follow-up appointment after discharge.  The patient does have transportation limitations that hinder transportation to clinic appointments.  .Services Needed at time of discharge: Y = Yes, Blank = No PT:   OT:   RN:   Equipment:   Other:     LOS: 4 days   Corky Sox, MD 10/29/2013, 7:18 AM

## 2013-10-29 NOTE — Telephone Encounter (Signed)
Spoke to patient's daughter and relayed information about bone density test not being covered.  She expressed understanding.

## 2013-10-30 LAB — GLUCOSE, CAPILLARY: Glucose-Capillary: 88 mg/dL (ref 70–99)

## 2013-10-30 MED ORDER — VANCOMYCIN 50 MG/ML ORAL SOLUTION: 125.0000 mg | Freq: Four times a day (QID) | ORAL | Status: DC

## 2013-10-30 NOTE — Progress Notes (Signed)
Subjective:  Patient seen at bedside this AM. No new complaints. Stable for discharge to [facility] today.  Objective: Vital signs in last 24 hours: Filed Vitals:   10/30/13 0129 10/30/13 0614 10/30/13 1055 10/30/13 1108  BP:  142/58 131/55 131/55  Pulse:  71 70   Temp:  97.8 F (36.6 C)    TempSrc:  Oral Oral   Resp:  18 20   Height:      Weight: 205 lb 6.4 oz (93.169 kg)     SpO2:  96% 96%    Weight change: -3 lb 11.2 oz (-1.678 kg)  Intake/Output Summary (Last 24 hours) at 10/30/13 1116 Last data filed at 10/30/13 1027  Gross per 24 hour  Intake   1032 ml  Output      0 ml  Net   1032 ml   Constitutional: Elderly female, NAD. Awake, alert, cooperative w/ exam.  Head: Normocephalic and atraumatic.  Eyes: EOMI intact. PERRL. No scleral icterus. Xanthoma above eyes  Neck: Supple, trachea midline, no JVD. Cardiovascular: RRR. No murmurs, gallops, or rubs. Pulmonary/Chest: Effort normal and breath sounds normal. No wheezing, crackles, or rales.  Abdominal: Soft. Bowel sounds are normal. There is no tenderness. There is no rebound and no guarding.  Musculoskeletal: Normal range of motion. Trace pitting edema.  Neurological: No cranial nerve deficit. No focal deficits. Alert and orienated to person and place. Normal strength 5/5 throughout. Normal sensation to light touch Skin: Skin is warm and dry. No erythema, rashes, or ulcerations.   Lab Results: Basic Metabolic Panel:  Recent Labs Lab 10/25/13 1023 10/26/13 0315 10/27/13 0427  NA 134* 136* 137  K 3.9 4.3 4.2  CL 97 100 100  CO2 25 21 23   GLUCOSE 112* 99 100*  BUN 13 12 12   CREATININE 1.17* 1.10 1.13*  CALCIUM 8.7 8.5 8.2*  MG 1.7  --   --   PHOS 2.6  --   --    Liver Function Tests:  Recent Labs Lab 10/25/13 1023  AST 15  ALT 15  ALKPHOS 87  BILITOT 0.2*  PROT 6.9  ALBUMIN 2.5*   CBC:  Recent Labs Lab 10/25/13 1023 10/26/13 0315 10/27/13 0427 10/29/13 0530  WBC 7.3 9.2 8.9 8.0    NEUTROABS 5.6 7.2  --   --   HGB 10.8* 10.6* 9.8* 9.6*  HCT 32.8* 33.1* 30.7* 30.3*  MCV 86.8 87.1 86.7 86.3  PLT 261 267 259 273   Cardiac Enzymes:  Recent Labs Lab 10/25/13 1023  TROPONINI <0.30   BNP:  Recent Labs Lab 10/25/13 1023  PROBNP 1044.0*   CBG:  Recent Labs Lab 10/26/13 0751 10/27/13 0638 10/28/13 0750 10/29/13 0645 10/30/13 0702  GLUCAP 100* 94 91 87 88   Coagulation:  Recent Labs Lab 10/25/13 1023  LABPROT 13.5  INR 1.05   Urinalysis:  Recent Labs Lab 10/25/13 1056  COLORURINE YELLOW  LABSPEC 1.016  PHURINE 5.5  GLUCOSEU NEGATIVE  HGBUR MODERATE*  BILIRUBINUR NEGATIVE  KETONESUR NEGATIVE  PROTEINUR 30*  UROBILINOGEN 0.2  NITRITE NEGATIVE  LEUKOCYTESUR LARGE*   Micro Results: Recent Results (from the past 240 hour(s))  URINE CULTURE     Status: None   Collection Time    10/25/13 10:56 AM      Result Value Range Status   Specimen Description URINE, CATHETERIZED   Final   Special Requests NONE   Final   Culture  Setup Time     Final   Value: 10/25/2013  14:30     Performed at [place]     Final   Value: >=100,000 COLONIES/ML     Performed at [facility]   Culture     Final   Value: Multiple bacterial morphotypes present, none predominant. Suggest appropriate recollection if clinically indicated.     Performed at [facility]   Report Status 10/26/2013 FINAL   Final  CLOSTRIDIUM DIFFICILE BY PCR     Status: Abnormal   Collection Time    10/25/13  9:41 PM      Result Value Range Status   C difficile by pcr POSITIVE (*) NEGATIVE Final   Comment: CRITICAL RESULT CALLED TO, READ BACK BY AND VERIFIED WITH:     Claudette Head [id number] 10/26/13 SCALES H  STOOL CULTURE     Status: None   Collection Time    10/25/13  9:41 PM      Result Value Range Status   Specimen Description STOOL   Final   Special Requests NONE   Final   Culture     Final   Value: NO SALMONELLA, SHIGELLA, CAMPYLOBACTER,  YERSINIA, OR E.COLI 0157:H7 ISOLATED     Performed at [facility]   Report Status 10/29/2013 FINAL   Final  MRSA PCR SCREENING     Status: None   Collection Time    10/26/13 11:46 PM      Result Value Range Status   MRSA by PCR NEGATIVE  NEGATIVE Final   Comment:            The GeneXpert MRSA Assay (FDA     approved for NASAL specimens     only), is one component of a     comprehensive MRSA colonization     surveillance program. It is not     intended to diagnose MRSA     infection nor to guide or     monitor treatment for     MRSA infections.   Studies/Results: No results found. Medications: I have reviewed the patient's current medications. Scheduled Meds: . amLODipine  5 mg Oral q morning - 10a  . dipyridamole-aspirin  1 capsule Oral BID  . donepezil  10 mg Oral QHS  . ferrous sulfate  325 mg Oral Q breakfast  . hydrALAZINE  10 mg Oral Q8H  . levothyroxine  150 mcg Oral QAC breakfast  . predniSONE  5 mg Oral Q breakfast  . saccharomyces boulardii  250 mg Oral BID  . sertraline  50 mg Oral q morning - 10a  . sodium chloride  3 mL Intravenous Q12H  . vancomycin  125 mg Oral Q6H   Continuous Infusions:  PRN Meds:.ondansetron (ZOFRAN) IV, ondansetron  Assessment/Plan: 80 year old woman with past medical history of hypertension, hyperlipidemia, hypothyroidism, recurrent TIAs, stroke (on aggrenox), NPH, giant cell arteritis (on chronic steroids), deconditioning, bacterial meningitis, recurrent UTI's, and depression who presents with generalized weakness, found to have c diff colitis on admission.    Generalized Weakness: Pt with 2-day history of generalized weakness, lethargy, decreased PO intake, loose stools, nasal congestion, and headache, presented with fever 100.5 F without other criteria for sepsis. Etiology most likely multifactorial due to c. Diff colitis, possible UTI, decreased PO intake, recent decrease in steroid dosage, and deconditioning.  - C. Diff +ve;  Continue po Vancomycin (total of 14 days; today is day 5). Will give 9 days on discharge. - Continue Prednisone 5 mg po qd (see below) - Patient to  return to ALF today  Recurrent c. diff colitis: Currently without diarrhea, but with a 1 week history of loose stools with chronic C. Diff infection. C. Diff positive on admission. Because this is a recurrent infection for the patient, po Vancomycin was started 1/31.  - Continue vancomycin 125 mg x4 daily, day 5/14. No recent diarrhea.  Diastolic Grade 2 Congestive Heart Failure:  2D-Echo 1/31 w/ EF 65-70% with grade 2 diastolic dysfunction. Net I/O +4.0 L since admission.   Giant Cell Temporal Arteritis: Currently asymptomatic. Pt w/o headache or vision changes. Pt with temporal arteritis with positive left temporal artery biopsy in March 2000. Pt with elevated ESR 62 and CRP 61.8 on 10/21/13.  - Continue home 5 mg prednisone daily.  Hypertension: Stable - Continuing home amlodipine 5 mg daily  - Continuing home hydralazine 10 mg TID   H/o CVA: Pt with history of right-sided stroke in February 2000.  - Continue home aggrenox 200-25 BID   Hypothyroidism: Pt with last TSH of 0.01 on 01/23/13  - Continue home levothyroxine 150 mcg daily   Depression: Stable  - Continue home donepezil 10 mg daily  - Continue home sertraline 50 mg daily   GERD: Currently without reflux symptoms  - Hold Protonix given C. Diff positive.   Diet: Regular   DVT PPx: SCD's   Code: Full  Dispo: Discharge to [facility] today.  The patient does have a current PCP (Sande Brothers, MD) and does not need an [facility] hospital follow-up appointment after discharge.  The patient does have transportation limitations that hinder transportation to clinic appointments.  .Services Needed at time of discharge: Y = Yes, Blank = No PT:   OT:   RN:   Equipment:   Other:     LOS: 5 days   Corky Sox, MD 10/30/2013, 11:16 AM

## 2013-10-30 NOTE — Progress Notes (Signed)
Internal Medicine Attending  Date: 10/30/2013  Patient name: [name] Medical record number: [id number] Date of birth: Oct 03, 1929 Age: 80 y.o. Gender: female  I saw and evaluated the patient. I reviewed the resident's note by Dr. Ronnald Ramp and I agree with the resident's findings and plans as documented in his progress note.  She felt well this morning and was ready for transfer back to the [facility]. I agree with the housestaff's plan to discharge her back to her assisted living facility. We will complete the course for her recurrent C. difficile colitis as an outpatient.

## 2013-10-30 NOTE — Progress Notes (Signed)
Physical Therapy Treatment Patient Details Name: [name] MRN: [id number] DOB: September 23, 1930 Today's Date: 10/30/2013 Time: 6301-6010 PT Time Calculation (min): 26 min  PT Assessment / Plan / Recommendation  History of Present Illness Pt admitted with diarrehea and weakness. She resides at the [facility]   Pt admitted with above. Pt currently with functional limitations due to weakness, balance and endurance deficits. Pt unable to get OOB to chair today secondary to nausea and incr weakness.  Pt will benefit from skilled PT to increase their independence and safety with mobility to allow discharge to the venue listed below.   Follow Up Recommendations  SNF;Supervision/Assistance - 24 hour                 Equipment Recommendations  None recommended by PT        Frequency Min 3X/week   Progress towards PT Goals Progress towards PT goals: Not progressing toward goals - comment (incr weakness today)  Plan Current plan remains appropriate    Precautions / Restrictions Precautions Precautions: Fall Restrictions Weight Bearing Restrictions: No   Pertinent Vitals/Pain VSS with BP 134/49 with HR 70 supine and 135/71 with HR 75 sitting; 98% on 2LO2; no pain    Mobility  Bed Mobility Overal bed mobility: Needs Assistance Bed Mobility: Rolling;Sidelying to Sit Rolling: Min assist Sidelying to sit: Mod assist Supine to sit: Max assist Sit to supine: Max assist General bed mobility comments: cues for sequence, use of rail and assist to scoot to EOB with HOB elevated grossly 25degrees Transfers General transfer comment: Unable to stand secondary once at EOB pt not feeling well.      Exercises General Exercises - Lower Extremity Ankle Circles/Pumps: AROM;Seated;Both;15 reps Long Arc Quad: AROM;Seated;Both;15 reps    PT Goals (current goals can now be found in the care plan section)    Visit Information  Last PT Received On: 10/30/13 Assistance Needed:  +1 History of Present Illness: Pt admitted with diarrehea and weakness. She resides at the [facility]    Subjective Data  Subjective: "I just can't seem to sit up.  i am nauseated."   Cognition  Cognition Arousal/Alertness: Awake/alert Behavior During Therapy: Flat affect Overall Cognitive Status: History of cognitive impairments - at baseline    Balance  Balance Overall balance assessment: Needs assistance;History of Falls Sitting-balance support: Bilateral upper extremity supported;Feet supported Sitting balance-Leahy Scale: Poor Sitting balance - Comments: Pt leaning greatly to right and was not able to sit up secondary to feeling nauseated.  Sat 7 min with no improvement and BP normal.  Notified nursing who came in to assess pt.  Decided to lie pt down instead of gettting her up.   Postural control: Right lateral lean  End of Session PT - End of Session Equipment Utilized During Treatment: Gait belt;Oxygen Activity Tolerance: Patient limited by fatigue Patient left: in bed;with call bell/phone within reach Nurse Communication: Mobility status (weakness and inability to get OOB safely with PT)        INGOLD,Dixie Coppa 10/30/2013, 12:35 PM [facility] Acute [facility] [phone] (pager)

## 2013-10-30 NOTE — Progress Notes (Signed)
Informed by PT that had gotten pt up to side of bed and then c/o feeling weak and got her back in bed.  BP131/55, P 70 02 sat 96 0n 2L.  Denies any pain.  stated feeling  better after she got back in bed.  Will continue to monitor.  Karie Kirks, Therapist, sports.

## 2013-10-30 NOTE — Discharge Summary (Signed)
Name: CHALICE PHILBERT MRN: [id number] DOB: 1930/02/04 80 y.o. PCP: Sande Brothers, MD  Date of Admission: 10/25/2013  9:40 AM Date of Discharge: 11/01/2013 Attending Physician: Karren Cobble, MD  Discharge Diagnosis: 1. Generalized Weakness 2. C. Diff Colitis 3. UTI 4. Grade 2 dCHF w/ mild exacerbation 5. Temporal Arteritis  6. HTN 7. H/o CVA 8. Hypothyroidism 9. Depression/mild dementia 10. GERD  Discharge Medications:   Medication List         acetaminophen 500 MG tablet  Commonly known as:  TYLENOL  Take 500 mg by mouth every 6 (six) hours as needed for pain.     amLODipine 5 MG tablet  Commonly known as:  NORVASC  Take 5 mg by mouth every morning.     BETA CAROTENE PO  Take 1 capsule by mouth daily.     Cranberry 250 MG Caps  Take 500 mg by mouth 2 (two) times daily.     dipyridamole-aspirin 200-25 MG per 12 hr capsule  Commonly known as:  AGGRENOX  Take 1 capsule by mouth 2 (two) times daily.     donepezil 10 MG tablet  Commonly known as:  ARICEPT  Take 10 mg by mouth at bedtime.     ferrous sulfate 325 (65 FE) MG tablet  Take 325 mg by mouth daily with breakfast.     hydrALAZINE 10 MG tablet  Commonly known as:  APRESOLINE  Take 1 tablet (10 mg total) by mouth every 8 (eight) hours.     levothyroxine 150 MCG tablet  Commonly known as:  SYNTHROID, LEVOTHROID  Take 150 mcg by mouth daily before breakfast.     omeprazole 20 MG capsule  Commonly known as:  PRILOSEC  Take 20 mg by mouth 2 (two) times daily before a meal.     ondansetron 4 MG tablet  Commonly known as:  ZOFRAN  Take 1 tablet (4 mg total) by mouth every 6 (six) hours as needed for nausea.     potassium chloride SA 20 MEQ tablet  Commonly known as:  K-DUR,KLOR-CON  Take 20 mEq by mouth 2 (two) times daily.     predniSONE 10 MG tablet  Commonly known as:  DELTASONE  Take 10 mg by mouth every other day. Alternating with 53m dose     predniSONE 5 MG tablet  Commonly known as:   DELTASONE  Take 15 mg by mouth every other day. Alternating with 130mdose     saccharomyces boulardii 250 MG capsule  Commonly known as:  FLORASTOR  Take 250 mg by mouth 2 (two) times daily.     sertraline 50 MG tablet  Commonly known as:  ZOLOFT  Take 50 mg by mouth every morning.     vancomycin 50 mg/mL oral solution  Commonly known as:  VANCOCIN  Take 2.5 mLs (125 mg total) by mouth every 6 (six) hours. Please take 125 mg (2.5 mL) q6h for 9 more days (end date 11/08/13).       Disposition and follow-up:   [facility] A [facility]  At the hospital follow up visit please address:  1.  Please continue Vancomycin po 125 mg qid for total of 14 days (started on 10/26/13; end date 11/08/13). Assess for continued diarrhea, weakness, and urinary symptoms.  2.  Labs / imaging needed at time of follow-up: UA/UCx, CBC  3.  Pending labs/ test needing follow-up: none  Follow-up Appointments:  Follow-up Information  Follow up with Sande Brothers, MD. (Please schedule follow up appointment in 1-2 weeks with your PCP.)    Specialty:  Internal Medicine     Discharge Instructions: Discharge Orders   Future Appointments Provider Department Dept Phone   04/21/2014 3:30 PM Marcial Pacas, MD [facility] [phone]   Future Orders Complete By Expires   Call MD for:  extreme fatigue  As directed    Call MD for:  persistant dizziness or light-headedness  As directed    Call MD for:  persistant nausea and vomiting  As directed    Call MD for:  temperature >100.4  As directed      Consultations:    Procedures Performed:  Dg Chest 2 View  10/28/2013   CLINICAL DATA:  CHF, shortness of breath,  EXAM: CHEST  2 VIEW  COMPARISON:  DG CHEST 1V PORT dated 10/25/2013  FINDINGS: Low lung volumes. The cardiac silhouette is enlarged. There is prominence of interstitial markings, areas peribronchial cuffing, and indistinctness of  the pulmonary vasculature. Areas of increased density projects within the lung bases. Blunting of the left costophrenic angle. A osseous structures are unremarkable.  IMPRESSION: Improved interstitial infiltrate consistent with decreased pulmonary edema. Areas of asymmetric edema versus non edematous infiltrates in lung bases left greater than right as well as possibly a very small left effusion.   Electronically Signed   By: Margaree Mackintosh M.D.   On: 10/28/2013 11:35   Dg Chest Port 1 View  10/25/2013   CLINICAL DATA:  Diarrhea, weakness, nausea, congestion, history hypertension, TIA, CHF, dementia  EXAM: PORTABLE CHEST - 1 VIEW  COMPARISON:  Portable exam 7989 hr compared to 07/19/2013  FINDINGS: Enlargement of cardiac silhouette with pulmonary vascular congestion.  Diffuse infiltrates bilaterally, question pulmonary edema or infection.  Calcified mildly tortuous thoracic aorta.  Small left pleural effusion.  No pneumothorax.  Bones demineralized.  IMPRESSION: Enlargement of cardiac silhouette with pulmonary vascular congestion and diffuse bilateral pulmonary infiltrates favor pulmonary edema over infection.   Electronically Signed   By: Lavonia Dana M.D.   On: 10/25/2013 16:02    2D Echo: 10/26/13  Study Conclusions: - Left ventricle: The cavity size was normal. There was moderate concentric hypertrophy. Systolic function was vigorous. The estimated ejection fraction was in the range of 65% to 70%. Wall motion was normal; there were no regional wall motion abnormalities. Features are consistent with a pseudonormal left ventricular filling pattern, with concomitant abnormal relaxation and increased filling pressure (grade 2 diastolic dysfunction). - Mitral valve: Calcified annulus. Mild to moderate regurgitation. - Left atrium: The atrium was mildly dilated.  Admission HPI:  Jeneva Schweizer is a 80 year old woman with past medical history of hypertension, hyperlipidemia, hypothyroidism,  recurrent TIAs, stroke, NPH, giant cell arteritis on corticsteroids, deconditioning, bacterial meningitis, recurrent UTI's, and depression who presented with generalized weakness of 2 day duration. Pt not able to give a clear history. Pt reports feeling weak, tired, headache, decreased PO intake, nasal congestion, and diarrhea.  Per assisted living facility, she was lethargic and weak and unable to ambulate with walker. She was having to use her wheelchair in the past 2 days. She slipped from her wheelchair a few days due to weakness with no head trauma. She was also reported to not being her normal self and less verbal with decreased po intake. She also had loose stools on and off for week prior to admission, with no blood in stool. Pt with urinary incontinence at baseline with reported burning with  urination and groin pain. Pt with history of recurrent UTI's with previous hospitalization secondary to E. Coli UTI and c. Diff colitis. There were also report that residents of the assisted [facility] where she lives had recent influenza outbreak.   Hospital Course by problem list:   1. Generalized Weakness- Patient presented from home w./ complaints of ongoing fatigue, HA, decreased po intake, and diarrhea, as well as urinary symptoms. Thought to be multifactorial 2/2 C. Diff colitis (confirmed 10/26/13) as well as UTI. Started on Vancomycin 125 mg q6h po, as patient has had C. Diff colitis in the past. UA showed likely UTI in the setting of urinary symptoms, however, urine culture showed multiple bacterial morphotypes. Treated w/ Ceftriaxone initially and switched to fosfomycin (for 1 day) for UTI, Patient also had recent exposure to Flu in ALF, started on Tamiflu, but given absence of respiratory symptoms, flu was unlikely, flu panel found to be negative. Home corticosteroid dose was decreased in the setting of an infection? and switched from 10-15 mg Prednisone every other day, to 5 mg po qd. Diarrhea,  weakness, and urinary symptoms subsided gradually. Seen by PT, suggested SNF placement, however, daughter initially refused, wishing for her to return to [facility]. However, after some difficulty (details below), ALF refused patient and was then discharged to SNF.  2. C. Diff Colitis- Patient w/ C. Diff in the past, hospitalized previously for infection. Found to have diarrhea, likely contributing to above complaint. Found to be C. Diff +ve on 10/26/13, started on po Vancomycin (on 10/26/13), given h/o recurrent C. Diff. Course to extend for 14 days, ending on 11/08/12. Diarrhea subsided on 10/27/13 w/ no further complaints of this. Patient to return to [facility] on discharge, however, ALF required C. Diff PCR to be -ve prior to returning, despite the absence of loose stools, thus, without an active infection. C. Diff PCR was repeated at the request of ALF, which was +ve as expected, given the nature of the test, and [facility] refused patient's return. Patient found bed at SNF.   3. UTI: Pt with complaints of mild dysuria and increased frequency on admission. History of urinary incontinence and recurrent UTI's. Pt with +LE on UA, culture with >100K colonies with multiple bacterial morphotypes suggesting contaminated sample. Pt received IV 1g ceftriaxone x1 dose, which was changed to fosfomycin x1 dose given her abx allergies and questionable UTI. No further complaints of dysuria.   4. Mild acute on chronic Grade 2 dCHF- Pt with 2D-echo of 05/13/12 with EF 55-60% and grade 2 diastolic dysfunction. 2D-echo on 10/12/12 with EF 34-19% with no diastolic dysfunction. Pt with elevated pro-BNP 1044 and chest xray on 1/30 with enlargement of cardiac silhouette with pulmonary vascular congestion and diffuse bilateral pulmonary infiltrates suggesting pulmonary edema over infection. Also with trace to +1 bilateral pitting LE edema. 2D-Echo 1/31 w/ EF 65-70% with grade 2 diastolic dysfunction. Given Lasix 20 mg IV  on 10/27/13 w/ mild diuresis. No further issues. Continued patient on O2 via [place] 2L, which she is dependent on at home, SpO2 in the 95-100% range.   5. Giant Cell Temporal Arteritis- Asymptomatic. Pt w/o headache or vision changes. Pt with temporal arteritis with positive left temporal artery biopsy in March 2000. Pt with elevated ESR 62 and CRP 61.8 on 10/21/13. Continued on 5 mg prednisone daily.  6. Hypertension- Stable during admission. Continued home amlodipine 5 mg daily as well as hydralazine 10 mg tid.  7. H/o CVA- Pt with history of  right-sided stroke in February 2000. Continued home aggrenox 200-25 BID   8. Hypothyroidism- Pt with last TSH of 0.01 on 01/23/13. Continued home levothyroxine 150 mcg daily   9. Depression/mild dementia- Stable. Continued home donepezil 10 mg daily and sertraline 50 mg daily   10. GERD- Without reflux symptoms. Discontinued home protonix given C. Diff colitis.  Discharge Vitals:   BP 164/65  Pulse 84  Temp(Src) 98.6 F (37 C) (Oral)  Resp 22  Ht 5' 10"  (1.778 m)  Wt 209 lb 10.5 oz (95.1 kg)  BMI 30.08 kg/m2  SpO2 97%  Discharge Labs:  No results found for this or any previous visit (from the past 24 hour(s)).  Signed: Corky Sox, MD 11/01/2013, 6:50 AM   Time Spent on Discharge: 45 minutes Services Ordered on Discharge: [place], [place] Ordered on Discharge: none

## 2013-10-30 NOTE — Discharge Instructions (Signed)
1. Please schedule a follow up appointment for 1-2 weeks with PCP  2. Please take all medications as prescribed. Please continue to take Vancomycin orally 4 times daily for 9 more days.  3. If you have worsening of your symptoms or new symptoms arise, please call the clinic ([phone]), or go to the ER immediately if symptoms are severe.   Clostridium Difficile Infection Clostridium difficile (C. difficile) is a bacteria found in the intestinal tract or colon. Under certain conditions, it causes diarrhea and sometimes severe disease. The severe form of the disease is known as pseudomembranous colitis (often called C. difficile colitis). This disease can damage the lining of the colon or cause the colon to become enlarged (toxic megacolon).  CAUSES  Your colon normally contains many different bacteria, including C. difficile. The balance of bacteria in your colon can change during illness. This is especially true when you take antibiotic medicine. Taking antibiotics may allow the C. difficile to grow, multiply excessively, and make a toxin that then causes illness. The elderly and people with certain medical conditions have a greater risk of getting C. difficile infections. SYMPTOMS   Watery diarrhea.  Fever.  Fatigue.  Loss of appetite.  Nausea.  Abdominal swelling, pain, or tenderness.  Dehydration. DIAGNOSIS  Your symptoms may make your caregiver suspicious of a C. difficile infection, especially if you have used antibiotics in the preceding weeks. However, there are only 2 ways to know for certain whether you have a C. difficile infection:  A lab test that finds the toxin in your stool.  The specific appearance of an abnormality (pseudomembrane) in your colon. This can only be seen by doing a sigmoidoscopy or colonoscopy. These procedures involve passing an instrument through your rectum to look at the inside of your colon. Your caregiver will help determine if these tests are  necessary. TREATMENT   Most people are successfully treated with one of two specific antibiotics, usually given by mouth. Other antibiotics you are receiving are stopped if possible.  Intravenous (IV) fluids and correction of electrolyte imbalance may be necessary.  Rarely, surgery may be needed to remove the infected part of the intestines.  Careful hand washing by you and your caregivers is important to prevent the spread of infection. In the hospital, your caregivers may also put on gowns and gloves to prevent the spread of the C. difficile bacteria. Your room is also cleaned regularly with a solution containing bleach or a product that is known to kill C. difficile. HOME CARE INSTRUCTIONS  Drink enough fluids to keep your urine clear or pale yellow. Avoid milk, caffeine, and alcohol.  Ask your caregiver for specific rehydration instructions.  Try eating small, frequent meals rather than large meals.  Take your antibiotics as directed. Finish them even if you start to feel better.  Do not use medicines to slow diarrhea. This could delay healing or cause complications.  Wash your hands thoroughly after using the bathroom and before preparing food.  Make sure people who live with you wash their hands often, too.  Carefully disinfect all surfaces with a product that contains chlorine bleach. SEEK MEDICAL CARE IF:  Diarrhea persists longer than expected or recurs after completing your course of antibiotic treatment for the C. difficile infection.  You have trouble staying hydrated. SEEK IMMEDIATE MEDICAL CARE IF:  You develop a new fever.  You have increasing abdominal pain or tenderness.  There is blood in your stools, or your stools are dark black and tarry.  You cannot hold down food or liquids. MAKE SURE YOU:   Understand these instructions.  Will watch your condition.  Will get help right away if you are not doing well or get worse. Document Released: 06/22/2005  Document Revised: 01/07/2013 Document Reviewed: 02/18/2011 [facility] Patient Information 2014 [place], [place].

## 2013-10-31 LAB — GLUCOSE, CAPILLARY: Glucose-Capillary: 96 mg/dL (ref 70–99)

## 2013-10-31 LAB — CLOSTRIDIUM DIFFICILE BY PCR: Toxigenic C. Difficile by PCR: POSITIVE — AB

## 2013-10-31 NOTE — Progress Notes (Signed)
Internal Medicine Attending  Date: 10/31/2013  Patient name: [name] Medical record number: [id number] Date of birth: Jun 17, 1930 Age: 80 y.o. Gender: female  I saw and evaluated the patient. I reviewed the resident's note by Dr. Ronnald Ramp and I agree with the resident's findings and plans as documented in his progress note.  [name] continues to do well from a medical standpoint.   ALF will not take her back until C. Diff negative, which will not be immediate.  At this point, since she has no indication for acute hospitalization, she will require a SNF placement pending acceptance back to the ALF.  This is currently being worked on.

## 2013-10-31 NOTE — Progress Notes (Signed)
Summary of today's events:  CSW has spoken to [facility] over 6 times today in effort to facilitate d/c back to facility.  Their concern is that patient has been treated for C-diff and they are requiring a negative culture before they will accept patient back. CSW spoke extensively with Dr. Ronnald Ramp and Claiborne Billings- Administrator of [facility]. Dr. Ronnald Ramp spoke directly with Claiborne Billings regarding patient and that she was ok to return to facility but they remain adamant that they cannot accept patient back without a negative culture.  It should be noted that patient is in a private room at the facility.  PT continues to recommend SNF placement but daughter Langley Gauss requested that patient return to the ALF.  Due to the above-a SNF option at this time is best choice for patient.  Daughter now agrees and bed choices given. She has accepted a bed at [facility] where patient has been a resident in the past. They have  A private room available; daughter will transport patient to facility in the morning around 10:30 a.m.  Dr. Ronnald Ramp notified.  CSW will facilitate d/c in the a.m.  Lorie Phenix. [place], [place]

## 2013-10-31 NOTE — Progress Notes (Signed)
Report given to receiving RN. Patient is in bed resting with no verbal complaints and no signs or symptoms of distress or discomfort.  

## 2013-10-31 NOTE — Progress Notes (Signed)
Subjective:  Patient seen at bedside this AM. No new complaints.   Patient stable for discharge yesterday to [facility], however, ALF demanded a -ve C. Diff PCR. C. Diff PCR repeated, found to be +ve.   Disposition unclear at this point.   Objective: Vital signs in last 24 hours: Filed Vitals:   10/30/13 1423 10/30/13 1501 10/30/13 2051 10/31/13 0612  BP: 128/60 138/51 155/55 179/52  Pulse:  65 65 78  Temp:  97.8 F (36.6 C) 97.9 F (36.6 C) 98.9 F (37.2 C)  TempSrc:  Oral Oral Oral  Resp:  20 20 17   Height:      Weight:    207 lb 7.3 oz (94.1 kg)  SpO2:  98% 98% 95%   Weight change: 2 lb 0.9 oz (0.931 kg)  Intake/Output Summary (Last 24 hours) at 10/31/13 0844 Last data filed at 10/31/13 0820  Gross per 24 hour  Intake    600 ml  Output      0 ml  Net    600 ml   Constitutional: Elderly female, NAD. Awake, alert, cooperative w/ exam.  Head: Normocephalic and atraumatic.  Eyes: EOMI intact. PERRL. No scleral icterus. Xanthoma above eyes  Neck: Supple, trachea midline, no JVD. Cardiovascular: RRR. No murmurs, gallops, or rubs. Pulmonary/Chest: Effort normal and breath sounds normal. No wheezing, crackles, or rales.  Abdominal: Soft. Bowel sounds are normal. There is no tenderness. There is no rebound and no guarding.  Musculoskeletal: Normal range of motion. Trace pitting edema.  Neurological: No cranial nerve deficit. No focal deficits. Alert and orienated to person and place. Normal strength 5/5 throughout. Normal sensation to light touch Skin: Skin is warm and dry. No erythema, rashes, or ulcerations.   Lab Results: Basic Metabolic Panel:  Recent Labs Lab 10/25/13 1023 10/26/13 0315 10/27/13 0427  NA 134* 136* 137  K 3.9 4.3 4.2  CL 97 100 100  CO2 25 21 23   GLUCOSE 112* 99 100*  BUN 13 12 12   CREATININE 1.17* 1.10 1.13*  CALCIUM 8.7 8.5 8.2*  MG 1.7  --   --   PHOS 2.6  --   --    Liver Function Tests:  Recent Labs Lab 10/25/13 1023  AST  15  ALT 15  ALKPHOS 87  BILITOT 0.2*  PROT 6.9  ALBUMIN 2.5*   CBC:  Recent Labs Lab 10/25/13 1023 10/26/13 0315 10/27/13 0427 10/29/13 0530  WBC 7.3 9.2 8.9 8.0  NEUTROABS 5.6 7.2  --   --   HGB 10.8* 10.6* 9.8* 9.6*  HCT 32.8* 33.1* 30.7* 30.3*  MCV 86.8 87.1 86.7 86.3  PLT 261 267 259 273   Cardiac Enzymes:  Recent Labs Lab 10/25/13 1023  TROPONINI <0.30   BNP:  Recent Labs Lab 10/25/13 1023  PROBNP 1044.0*   CBG:  Recent Labs Lab 10/26/13 0751 10/27/13 0638 10/28/13 0750 10/29/13 0645 10/30/13 0702  GLUCAP 100* 94 91 87 88   Coagulation:  Recent Labs Lab 10/25/13 1023  LABPROT 13.5  INR 1.05   Urinalysis:  Recent Labs Lab 10/25/13 1056  COLORURINE YELLOW  LABSPEC 1.016  PHURINE 5.5  GLUCOSEU NEGATIVE  HGBUR MODERATE*  BILIRUBINUR NEGATIVE  KETONESUR NEGATIVE  PROTEINUR 30*  UROBILINOGEN 0.2  NITRITE NEGATIVE  LEUKOCYTESUR LARGE*   Micro Results: Recent Results (from the past 240 hour(s))  URINE CULTURE     Status: None   Collection Time    10/25/13 10:56 AM      Result Value  Range Status   Specimen Description URINE, CATHETERIZED   Final   Special Requests NONE   Final   Culture  Setup Time     Final   Value: 10/25/2013 14:30     Performed at [facility] Count     Final   Value: >=100,000 COLONIES/ML     Performed at [facility]   Culture     Final   Value: Multiple bacterial morphotypes present, none predominant. Suggest appropriate recollection if clinically indicated.     Performed at [facility]   Report Status 10/26/2013 FINAL   Final  CLOSTRIDIUM DIFFICILE BY PCR     Status: Abnormal   Collection Time    10/25/13  9:41 PM      Result Value Range Status   C difficile by pcr POSITIVE (*) NEGATIVE Final   Comment: CRITICAL RESULT CALLED TO, READ BACK BY AND VERIFIED WITH:     Claudette Head [id number] 10/26/13 SCALES H  STOOL CULTURE     Status: None   Collection Time    10/25/13  9:41  PM      Result Value Range Status   Specimen Description STOOL   Final   Special Requests NONE   Final   Culture     Final   Value: NO SALMONELLA, SHIGELLA, CAMPYLOBACTER, YERSINIA, OR E.COLI 0157:H7 ISOLATED     Performed at [facility]   Report Status 10/29/2013 FINAL   Final  MRSA PCR SCREENING     Status: None   Collection Time    10/26/13 11:46 PM      Result Value Range Status   MRSA by PCR NEGATIVE  NEGATIVE Final   Comment:            The GeneXpert MRSA Assay (FDA     approved for NASAL specimens     only), is one component of a     comprehensive MRSA colonization     surveillance program. It is not     intended to diagnose MRSA     infection nor to guide or     monitor treatment for     MRSA infections.   Studies/Results: No results found. Medications: I have reviewed the patient's current medications. Scheduled Meds: . amLODipine  5 mg Oral q morning - 10a  . dipyridamole-aspirin  1 capsule Oral BID  . donepezil  10 mg Oral QHS  . ferrous sulfate  325 mg Oral Q breakfast  . hydrALAZINE  10 mg Oral Q8H  . levothyroxine  150 mcg Oral QAC breakfast  . predniSONE  5 mg Oral Q breakfast  . saccharomyces boulardii  250 mg Oral BID  . sertraline  50 mg Oral q morning - 10a  . sodium chloride  3 mL Intravenous Q12H  . vancomycin  125 mg Oral Q6H   Continuous Infusions:  PRN Meds:.ondansetron (ZOFRAN) IV, ondansetron  Assessment/Plan: 80 year old woman with past medical history of hypertension, hyperlipidemia, hypothyroidism, recurrent TIAs, stroke (on aggrenox), NPH, giant cell arteritis (on chronic steroids), deconditioning, bacterial meningitis, recurrent UTI's, and depression who presents with generalized weakness, found to have c diff colitis on admission.    Generalized Weakness; resolved: Pt with 2-day history of generalized weakness, lethargy, decreased PO intake, loose stools, nasal congestion, and headache, presented with fever 100.5 F without other  criteria for sepsis. Etiology most likely multifactorial due to c. Diff colitis, possible UTI, decreased PO intake, recent decrease in steroid dosage, and  deconditioning.  - C. Diff +ve; Continue po Vancomycin. End date 11/08/13. - Continue Prednisone 5 mg po qd (see below) - Patient to return to ALF today  Recurrent c. diff colitis: Symptoms resolved. Previously with 1 week history of loose stools with chronic C. Diff infection. C. Diff positive on admission. Because this is a recurrent infection for the patient, po Vancomycin was started 1/31.  - Continue vancomycin po 125 mg x4 daily, ending on 6/78/93  Diastolic Grade 2 Congestive Heart Failure:  No issues.  Giant Cell Temporal Arteritis: Currently asymptomatic. Pt w/o headache or vision changes. Pt with temporal arteritis with positive left temporal artery biopsy in March 2000. Pt with elevated ESR 62 and CRP 61.8 on 10/21/13.  - Continue 5 mg prednisone daily.  Hypertension: Stable - Continuing home amlodipine 5 mg daily  - Continuing home hydralazine 10 mg TID   H/o CVA: Pt with history of right-sided stroke in February 2000.  - Continue home aggrenox 200-25 BID   Hypothyroidism: Pt with last TSH of 0.01 on 01/23/13  - Continue home levothyroxine 150 mcg daily   Depression: Stable  - Continue home donepezil 10 mg daily  - Continue home sertraline 50 mg daily   GERD: Currently without reflux symptoms  - Hold Protonix given C. Diff positive.   Diet: Regular   DVT PPx: SCD's   Code: Full  Dispo:   [facility] refused patient yesterday (10/30/13) as they "require" a negative C. Diff PCR prior to taking patient back, despite the fact that the patient is without loose stools for several days, therefore no longer fitting the criteria for C. Diff infection. Disposition unclear at this time as daughter also previously refused SNF placement. Will further discuss with family and SW in order to clarify disposition as well as Research scientist (medical) at [employer].  The patient does have a current PCP (Sande Brothers, MD) and does not need an [facility] hospital follow-up appointment after discharge.  The patient does have transportation limitations that hinder transportation to clinic appointments.  .Services Needed at time of discharge: Y = Yes, Blank = No PT:   OT:   RN:   Equipment:   Other:     LOS: 6 days   Corky Sox, MD 10/31/2013, 8:44 AM

## 2013-11-01 DIAGNOSIS — Z86718 Personal history of other venous thrombosis and embolism: Secondary | ICD-10-CM

## 2013-11-01 DIAGNOSIS — A0472 Enterocolitis due to Clostridium difficile, not specified as recurrent: Principal | ICD-10-CM

## 2013-11-01 LAB — GLUCOSE, CAPILLARY: GLUCOSE-CAPILLARY: 96 mg/dL (ref 70–99)

## 2013-11-01 MED ORDER — VANCOMYCIN 50 MG/ML ORAL SOLUTION: 125.0000 mg | Freq: Four times a day (QID) | ORAL | Status: DC

## 2013-11-01 MED ORDER — ACETAMINOPHEN 325 MG PO TABS
650.0000 mg | ORAL_TABLET | Freq: Once | ORAL | Status: AC
  Administered 2013-11-01: 650 mg via ORAL
  Filled 2013-11-01: qty 2

## 2013-11-01 NOTE — Progress Notes (Signed)
Subjective:  Patient seen at bedside this AM. No new complaints.   Return to [facility] refused by facility yesterday. Discharge to SNF today.  Objective: Vital signs in last 24 hours: Filed Vitals:   10/31/13 1012 10/31/13 1415 10/31/13 2129 11/01/13 0444  BP: 126/46 139/46 149/57 164/65  Pulse: 70 73 72 84  Temp:  98.4 F (36.9 C) 99.2 F (37.3 C) 98.6 F (37 C)  TempSrc:  Oral Oral Oral  Resp:  _0 Height:      Weight:    209 lb 10.5 oz (95.1 kg)  SpO2:  95% 96% 97%   Weight change: 2 lb 3.3 oz (1 kg)  Intake/Output Summary (Last 24 hours) at 11/01/13 6967 Last data filed at 10/31/13 1809  Gross per 24 hour  Intake    600 ml  Output      0 ml  Net    600 ml   Constitutional: Elderly female, NAD. Awake, alert, cooperative w/ exam.  Head: Normocephalic and atraumatic.  Eyes: EOMI intact. PERRL. No scleral icterus. Xanthoma above eyes  Neck: Supple, trachea midline, no JVD. Cardiovascular: RRR. No murmurs, gallops, or rubs. Pulmonary/Chest: Effort normal and breath sounds normal. No wheezing, crackles, or rales.  Abdominal: Soft. Bowel sounds are normal. There is no tenderness. There is no rebound and no guarding.  Musculoskeletal: Normal range of motion. Trace pitting edema.  Neurological: No cranial nerve deficit. No focal deficits. Alert and orienated to person and place. Normal strength 5/5 throughout. Normal sensation to light touch Skin: Skin is warm and dry. No erythema, rashes, or ulcerations.   Lab Results: Basic Metabolic Panel:  Recent Labs Lab 10/25/13 1023 10/26/13 0315 10/27/13 0427  NA 134* 136* 137  K 3.9 4.3 4.2  CL 97 100 100  CO2 _1 GLUCOSE 112* 99 100*  BUN _2 CREATININE 1.17* 1.10 1.13*  CALCIUM 8.7 8.5 8.2*  MG 1.7  --   --   PHOS 2.6  --   --    Liver Function Tests:  Recent Labs Lab 10/25/13 1023  AST 15  ALT 15  ALKPHOS 87  BILITOT 0.2*  PROT 6.9  ALBUMIN 2.5*   CBC:  Recent Labs Lab  10/25/13 1023 10/26/13 0315 10/27/13 0427 10/29/13 0530  WBC 7.3 9.2 8.9 8.0  NEUTROABS 5.6 7.2  --   --   HGB 10.8* 10.6* 9.8* 9.6*  HCT 32.8* 33.1* 30.7* 30.3*  MCV 86.8 87.1 86.7 86.3  PLT 261 267 259 273   Cardiac Enzymes:  Recent Labs Lab 10/25/13 1023  TROPONINI <0.30   BNP:  Recent Labs Lab 10/25/13 1023  PROBNP 1044.0*   CBG:  Recent Labs Lab 10/27/13 0638 10/28/13 0750 10/29/13 0645 10/30/13 0702 10/31/13 0610 11/01/13 0643  GLUCAP 94 91 87 88 96 96   Coagulation:  Recent Labs Lab 10/25/13 1023  LABPROT 13.5  INR 1.05   Urinalysis:  Recent Labs Lab 10/25/13 1056  COLORURINE YELLOW  LABSPEC 1.016  PHURINE 5.5  GLUCOSEU NEGATIVE  HGBUR MODERATE*  BILIRUBINUR NEGATIVE  KETONESUR NEGATIVE  PROTEINUR 30*  UROBILINOGEN 0.2  NITRITE NEGATIVE  LEUKOCYTESUR LARGE*   Micro Results: Recent Results (from the past 240 hour(s))  URINE CULTURE     Status: None   Collection Time    10/25/13 10:56 AM      Result Value Range Status   Specimen Description URINE, CATHETERIZED   Final   Special Requests NONE  Final   Culture  Setup Time     Final   Value: 10/25/2013 14:30     Performed at [facility]   [place] Count     Final   Value: >=100,000 COLONIES/ML     Performed at [facility]   Culture     Final   Value: Multiple bacterial morphotypes present, none predominant. Suggest appropriate recollection if clinically indicated.     Performed at [facility]   Report Status 10/26/2013 FINAL   Final  CLOSTRIDIUM DIFFICILE BY PCR     Status: Abnormal   Collection Time    10/25/13  9:41 PM      Result Value Range Status   C difficile by pcr POSITIVE (*) NEGATIVE Final   Comment: CRITICAL RESULT CALLED TO, READ BACK BY AND VERIFIED WITH:     UPHAM T,RN 0936 10/26/13 SCALES H  STOOL CULTURE     Status: None   Collection Time    10/25/13  9:41 PM      Result Value Range Status   Specimen Description STOOL   Final    Special Requests NONE   Final   Culture     Final   Value: NO SALMONELLA, SHIGELLA, CAMPYLOBACTER, YERSINIA, OR E.COLI 0157:H7 ISOLATED     Performed at [facility]   Report Status 10/29/2013 FINAL   Final  MRSA PCR SCREENING     Status: None   Collection Time    10/26/13 11:46 PM      Result Value Range Status   MRSA by PCR NEGATIVE  NEGATIVE Final   Comment:            The GeneXpert MRSA Assay (FDA     approved for NASAL specimens     only), is one component of a     comprehensive MRSA colonization     surveillance program. It is not     intended to diagnose MRSA     infection nor to guide or     monitor treatment for     MRSA infections.  CLOSTRIDIUM DIFFICILE BY PCR     Status: Abnormal   Collection Time    10/31/13 12:00 AM      Result Value Range Status   C difficile by pcr POSITIVE (*) NEGATIVE Final   Comment: CRITICAL RESULT CALLED TO, READ BACK BY AND VERIFIED WITH:     JOHNSON RN 12:45 10/31/13 ([email or web address])   Studies/Results: No results found. Medications: I have reviewed the patient's current medications. Scheduled Meds: . amLODipine  5 mg Oral q morning - 10a  . dipyridamole-aspirin  1 capsule Oral BID  . donepezil  10 mg Oral QHS  . ferrous sulfate  325 mg Oral Q breakfast  . hydrALAZINE  10 mg Oral Q8H  . levothyroxine  150 mcg Oral QAC breakfast  . predniSONE  5 mg Oral Q breakfast  . saccharomyces boulardii  250 mg Oral BID  . sertraline  50 mg Oral q morning - 10a  . sodium chloride  3 mL Intravenous Q12H  . vancomycin  125 mg Oral Q6H   Continuous Infusions:  PRN Meds:.ondansetron (ZOFRAN) IV, ondansetron  Assessment/Plan: 80-year-old woman with past medical history of hypertension, hyperlipidemia, hypothyroidism, recurrent TIAs, stroke (on aggrenox), NPH, giant cell arteritis (on chronic steroids), deconditioning, bacterial meningitis, recurrent UTI's, and depression who presents with generalized weakness, found to have c diff colitis on  admission.    Generalized Weakness; resolved: Pt with 2-day   history of generalized weakness, lethargy, decreased PO intake, loose stools, nasal congestion, and headache, presented with fever 100.5 F without other criteria for sepsis. Etiology most likely multifactorial due to c. Diff colitis, possible UTI, decreased PO intake, recent decrease in steroid dosage, and deconditioning.  - C. Diff +ve; Continue po Vancomycin. End date 11/08/13. - Continue Prednisone 5 mg po qd (see below) - Discharge to SNF today  Recurrent c. diff colitis: Symptoms resolved. Previously with 1 week history of loose stools with chronic C. Diff infection. C. Diff positive on admission. Because this is a recurrent infection for the patient, po Vancomycin was started 1/31.  - Continue vancomycin po 125 mg x4 daily, ending on 11/08/13  Diastolic Grade 2 Congestive Heart Failure:  No issues.  Giant Cell Temporal Arteritis: Currently asymptomatic. Pt w/o headache or vision changes. Pt with temporal arteritis with positive left temporal artery biopsy in March 2000. Pt with elevated ESR 62 and CRP 61.8 on 10/21/13.  - Continue 5 mg prednisone daily.  Hypertension: Stable - Continuing home amlodipine 5 mg daily  - Continuing home hydralazine 10 mg TID   H/o CVA: Pt with history of right-sided stroke in February 2000.  - Continue home aggrenox 200-25 BID   Hypothyroidism: Pt with last TSH of 0.01 on 01/23/13  - Continue home levothyroxine 150 mcg daily   Depression: Stable  - Continue home donepezil 10 mg daily  - Continue home sertraline 50 mg daily   GERD: Currently without reflux symptoms  - Hold Protonix given C. Diff positive.   Diet: Regular   DVT PPx: SCD's   Code: Full  Dispo: Given [facility] refusal to take patient w/out -ve C. Diff PCR, CSW d/w daughter and okay for discharge to SNF today.  The patient does have a current PCP (Samuel Bowen, MD) and does not need an [facility] hospital follow-up appointment  after discharge.  The patient does have transportation limitations that hinder transportation to clinic appointments.  .Services Needed at time of discharge: Y = Yes, Blank = No PT:   OT:   RN:   Equipment:   Other: SNF    LOS: 7 days    W , MD 11/01/2013, 8:39 AM  

## 2013-11-01 NOTE — Clinical Social Work Placement (Signed)
     Clinical Social Work Department CLINICAL SOCIAL WORK PLACEMENT NOTE 11/01/2013  Patient:  LEEANDRA, ELLERSON  Account Number:  [id number] Admit date:  10/25/2013  Clinical Social Worker:  Butch Penny Kenna Seward, LCSWA  Date/time:  10/29/2013 01:30 PM  Clinical Social Work is seeking post-discharge placement for this patient at the following level of care:   SKILLED NURSING   (*CSW will update this form in Epic as items are completed)   10/29/2013  Patient/family provided with [place] Department of Clinical Social Works list of facilities offering this level of care within the geographic area requested by the patient (or if unable, by the patients family).  10/29/2013  Patient/family informed of their freedom to choose among providers that offer the needed level of care, that participate in Medicare, Medicaid or managed care program needed by the patient, have an available bed and are willing to accept the patient.  10/29/2013  Patient/family informed of [facility] ownership interest in [facility], as well as of the fact that they are under no obligation to receive care at this facility.  PASARR submitted to EDS on  PASARR number received from [place] on   FL2 transmitted to all facilities in geographic area requested by pt/family on  10/30/2013 FL2 transmitted to all facilities within larger geographic area on   Patient informed that his/her managed care company has contracts with or will negotiate with  certain facilities, including the following:   Has existing PASARR number     Patient/family informed of bed offers received:  10/31/2013 Patient chooses bed at [place], [facility] Physician recommends and patient chooses bed at    Patient to be transferred to [place] on  11/01/2013 Patient to be transferred to facility by Car with daughter  The following physician request were entered in Epic:   Additional Comments: 11/01/13  DC to SNF  today as CSW was unable to facilitate d/c back to ALF. This was daughter's wish but due to C-diff status they would not accept her back until a negative screen could be performed.  Daughter is understanding of this and will continue to hold bed at facility.  Nursing notified to call report.  No furhter CSW needs identified. CSW signing off.  Lorie Phenix. [place], [place]

## 2013-11-01 NOTE — Progress Notes (Signed)
Internal Medicine Attending  Date: 11/01/2013  Patient name: [name] Medical record number: [id number] Date of birth: 1930-01-03 Age: 80 y.o. Gender: female  I saw and evaluated the patient. I reviewed the resident's note by Dr. Ronnald Ramp and I agree with the resident's findings and plans as documented in his progress note.  She remains stable w/o diarrhea.  ALF will not take back until C. diff screen is negative.  Agree with transfer to SNF until she can return to ALF.

## 2013-11-01 NOTE — Progress Notes (Signed)
IV d/c'd.  Tele d/c'd.  Pt d/c'd to SNF.  Home meds and d/c instructions have been discussed with pt and pt daughter.  Both deny any questions or concerns.  Pt leaving unit via wheelchair and will be transported to SNF by private vehicle.  Report has been given to receiving nurse at facility who denies any questions or concerns at this time. Gae Gallop RN

## 2013-11-01 NOTE — Clinical Social Work Psychosocial (Addendum)
    Clinical Social Work Department BRIEF PSYCHOSOCIAL ASSESSMENT 11/01/2013  Patient:  [name], [name]     Account Number:  [id number]     Admit date:  10/25/2013  Clinical Social Worker:  Iona Coach  Date/Time:  10/28/2013 11:45 AM  Referred by:  Physician  Date Referred:  10/28/2013 Referred for  Other - See comment   Other Referral:   Return to ALF vs SNF   Interview type:  Other - See comment Other interview type:   Patient and message left for daughter- Neomia Glass    PSYCHOSOCIAL DATA Living Status:  FACILITY Admitted from facility:  OTHER Level of care:   Primary support name:  Neomia Glass Primary support relationship to patient:  CHILD, ADULT Degree of support available:   Strong support    CURRENT CONCERNS Current Concerns  Post-Acute Placement   Other Concerns:   From ALF- Increase level of care to SNF per PT recommendation    SOCIAL WORK ASSESSMENT / PLAN 80 year old female- resident of [facility].  Patient is alert but has some signs of mild forgetfulness.  She states she would like to return to facility when medically stable. Physical Therapy is recommending SNF placement for patient. Patient defers to her daughter but agrees to SNF if indicated.  Fl2 placed on chart for MD's signature.   Assessment/plan status:  Psychosocial Support/Ongoing Assessment of Needs Other assessment/ plan:   Information/referral to community resources:   SNF list placed in patient's room    PATIENT'S/FAMILY'S RESPONSE TO PLAN OF CARE: Patient is alert, oriented to at least person and is very pleasant. She relates that she likes living at [facility] and hopes to return there but is open to SNF if needed.  Patient states that her daughter Langley Gauss makes health care decisions for her. CSW will continue to attempt to reach daughter re: above.  Lorie Phenix. [place], [place]

## 2014-02-17 ENCOUNTER — Encounter (HOSPITAL_COMMUNITY): Payer: Self-pay | Admitting: Emergency Medicine

## 2014-02-17 ENCOUNTER — Emergency Department (HOSPITAL_COMMUNITY): Payer: Medicare Other

## 2014-02-17 ENCOUNTER — Observation Stay (HOSPITAL_COMMUNITY)
Admission: EM | Admit: 2014-02-17 | Discharge: 2014-02-19 | Disposition: A | Discharge status: Skilled Nursing Facility | Payer: Medicare Other | Attending: Oncology | Admitting: Oncology

## 2014-02-17 DIAGNOSIS — M545 Low back pain, unspecified: Secondary | ICD-10-CM

## 2014-02-17 DIAGNOSIS — R5381 Other malaise: Secondary | ICD-10-CM | POA: Insufficient documentation

## 2014-02-17 DIAGNOSIS — N390 Urinary tract infection, site not specified: Secondary | ICD-10-CM

## 2014-02-17 DIAGNOSIS — IMO0002 Reserved for concepts with insufficient information to code with codable children: Secondary | ICD-10-CM | POA: Insufficient documentation

## 2014-02-17 DIAGNOSIS — I130 Hypertensive heart and chronic kidney disease with heart failure and stage 1 through stage 4 chronic kidney disease, or unspecified chronic kidney disease: Secondary | ICD-10-CM | POA: Insufficient documentation

## 2014-02-17 DIAGNOSIS — I509 Heart failure, unspecified: Secondary | ICD-10-CM | POA: Insufficient documentation

## 2014-02-17 DIAGNOSIS — M25519 Pain in unspecified shoulder: Secondary | ICD-10-CM | POA: Insufficient documentation

## 2014-02-17 DIAGNOSIS — F329 Major depressive disorder, single episode, unspecified: Secondary | ICD-10-CM | POA: Insufficient documentation

## 2014-02-17 DIAGNOSIS — E039 Hypothyroidism, unspecified: Secondary | ICD-10-CM | POA: Insufficient documentation

## 2014-02-17 DIAGNOSIS — W19XXXA Unspecified fall, initial encounter: Secondary | ICD-10-CM

## 2014-02-17 DIAGNOSIS — R079 Chest pain, unspecified: Principal | ICD-10-CM | POA: Insufficient documentation

## 2014-02-17 DIAGNOSIS — G609 Hereditary and idiopathic neuropathy, unspecified: Secondary | ICD-10-CM | POA: Insufficient documentation

## 2014-02-17 DIAGNOSIS — R32 Unspecified urinary incontinence: Secondary | ICD-10-CM | POA: Insufficient documentation

## 2014-02-17 DIAGNOSIS — R5383 Other fatigue: Secondary | ICD-10-CM

## 2014-02-17 DIAGNOSIS — D649 Anemia, unspecified: Secondary | ICD-10-CM

## 2014-02-17 DIAGNOSIS — I5022 Chronic systolic (congestive) heart failure: Secondary | ICD-10-CM | POA: Insufficient documentation

## 2014-02-17 DIAGNOSIS — R531 Weakness: Secondary | ICD-10-CM

## 2014-02-17 DIAGNOSIS — C189 Malignant neoplasm of colon, unspecified: Secondary | ICD-10-CM | POA: Insufficient documentation

## 2014-02-17 DIAGNOSIS — N183 Chronic kidney disease, stage 3 unspecified: Secondary | ICD-10-CM | POA: Insufficient documentation

## 2014-02-17 DIAGNOSIS — E785 Hyperlipidemia, unspecified: Secondary | ICD-10-CM | POA: Insufficient documentation

## 2014-02-17 DIAGNOSIS — Z87891 Personal history of nicotine dependence: Secondary | ICD-10-CM | POA: Insufficient documentation

## 2014-02-17 DIAGNOSIS — F3289 Other specified depressive episodes: Secondary | ICD-10-CM | POA: Insufficient documentation

## 2014-02-17 DIAGNOSIS — Z9889 Other specified postprocedural states: Secondary | ICD-10-CM

## 2014-02-17 DIAGNOSIS — M542 Cervicalgia: Secondary | ICD-10-CM

## 2014-02-17 DIAGNOSIS — M316 Other giant cell arteritis: Secondary | ICD-10-CM

## 2014-02-17 DIAGNOSIS — F0390 Unspecified dementia without behavioral disturbance: Secondary | ICD-10-CM | POA: Insufficient documentation

## 2014-02-17 DIAGNOSIS — R1032 Left lower quadrant pain: Secondary | ICD-10-CM

## 2014-02-17 DIAGNOSIS — N189 Chronic kidney disease, unspecified: Secondary | ICD-10-CM

## 2014-02-17 DIAGNOSIS — I5032 Chronic diastolic (congestive) heart failure: Secondary | ICD-10-CM

## 2014-02-17 DIAGNOSIS — Z9181 History of falling: Secondary | ICD-10-CM | POA: Insufficient documentation

## 2014-02-17 DIAGNOSIS — K219 Gastro-esophageal reflux disease without esophagitis: Secondary | ICD-10-CM | POA: Insufficient documentation

## 2014-02-17 DIAGNOSIS — Z8673 Personal history of transient ischemic attack (TIA), and cerebral infarction without residual deficits: Secondary | ICD-10-CM | POA: Insufficient documentation

## 2014-02-17 DIAGNOSIS — Z9981 Dependence on supplemental oxygen: Secondary | ICD-10-CM | POA: Insufficient documentation

## 2014-02-17 LAB — CBC
HCT: 39.4 % (ref 36.0–46.0)
HEMOGLOBIN: 12.6 g/dL (ref 12.0–15.0)
MCH: 28.4 pg (ref 26.0–34.0)
MCHC: 32 g/dL (ref 30.0–36.0)
MCV: 88.9 fL (ref 78.0–100.0)
PLATELETS: 251 10*3/uL (ref 150–400)
RBC: 4.43 MIL/uL (ref 3.87–5.11)
RDW: 16.7 % — ABNORMAL HIGH (ref 11.5–15.5)
WBC: 12.1 10*3/uL — AB (ref 4.0–10.5)

## 2014-02-17 LAB — PRO B NATRIURETIC PEPTIDE: Pro B Natriuretic peptide (BNP): 833 pg/mL — ABNORMAL HIGH (ref 0–450)

## 2014-02-17 LAB — MRSA PCR SCREENING: MRSA BY PCR: POSITIVE — AB

## 2014-02-17 LAB — BASIC METABOLIC PANEL
BUN: 19 mg/dL (ref 6–23)
CO2: 23 meq/L (ref 19–32)
Calcium: 9.1 mg/dL (ref 8.4–10.5)
Chloride: 104 mEq/L (ref 96–112)
Creatinine, Ser: 1.13 mg/dL — ABNORMAL HIGH (ref 0.50–1.10)
GFR calc Af Amer: 51 mL/min — ABNORMAL LOW (ref >90)
GFR calc non Af Amer: 44 mL/min — ABNORMAL LOW (ref >90)
Glucose, Bld: 99 mg/dL (ref 70–99)
POTASSIUM: 4.7 meq/L (ref 3.7–5.3)
SODIUM: 141 meq/L (ref 137–147)

## 2014-02-17 LAB — TSH: TSH: 0.096 u[IU]/mL — ABNORMAL LOW (ref 0.350–4.500)

## 2014-02-17 LAB — TROPONIN I: Troponin I: <0.3 ng/mL (ref <0.30)

## 2014-02-17 MED ORDER — PREDNISONE 10 MG PO TABS
10.0000 mg | ORAL_TABLET | ORAL | Status: DC
  Administered 2014-02-18: 10 mg via ORAL
  Filled 2014-02-17: qty 1

## 2014-02-17 MED ORDER — PREDNISONE 5 MG PO TABS
15.0000 mg | ORAL_TABLET | ORAL | Status: DC
  Administered 2014-02-19: 15 mg via ORAL
  Filled 2014-02-17: qty 1

## 2014-02-17 MED ORDER — NITROGLYCERIN 0.4 MG SL SUBL
0.4000 mg | SUBLINGUAL_TABLET | SUBLINGUAL | Status: DC | PRN
  Administered 2014-02-17 (×2): 0.4 mg via SUBLINGUAL

## 2014-02-17 MED ORDER — SACCHAROMYCES BOULARDII 250 MG PO CAPS
250.0000 mg | ORAL_CAPSULE | Freq: Two times a day (BID) | ORAL | Status: DC
  Administered 2014-02-17 – 2014-02-19 (×4): 250 mg via ORAL
  Filled 2014-02-17 (×5): qty 1

## 2014-02-17 MED ORDER — SODIUM CHLORIDE 0.9 % IJ SOLN
3.0000 mL | Freq: Two times a day (BID) | INTRAMUSCULAR | Status: DC
  Administered 2014-02-17 – 2014-02-18 (×3): 3 mL via INTRAVENOUS

## 2014-02-17 MED ORDER — DONEPEZIL HCL 10 MG PO TABS
10.0000 mg | ORAL_TABLET | Freq: Every day | ORAL | Status: DC
  Administered 2014-02-17 – 2014-02-18 (×2): 10 mg via ORAL
  Filled 2014-02-17 (×3): qty 1

## 2014-02-17 MED ORDER — CHLORHEXIDINE GLUCONATE CLOTH 2 % EX PADS
6.0000 | MEDICATED_PAD | Freq: Every day | CUTANEOUS | Status: DC
  Administered 2014-02-18 – 2014-02-19 (×2): 6 via TOPICAL

## 2014-02-17 MED ORDER — KETOROLAC TROMETHAMINE 15 MG/ML IJ SOLN
15.0000 mg | Freq: Once | INTRAMUSCULAR | Status: AC
  Administered 2014-02-17: 15 mg via INTRAVENOUS
  Filled 2014-02-17: qty 1

## 2014-02-17 MED ORDER — SERTRALINE HCL 50 MG PO TABS
50.0000 mg | ORAL_TABLET | Freq: Every day | ORAL | Status: DC
  Administered 2014-02-18 – 2014-02-19 (×2): 50 mg via ORAL
  Filled 2014-02-17 (×2): qty 1

## 2014-02-17 MED ORDER — ACETAMINOPHEN 650 MG RE SUPP: 650.0000 mg | Freq: Four times a day (QID) | RECTAL | Status: DC | PRN

## 2014-02-17 MED ORDER — HEPARIN SODIUM (PORCINE) 5000 UNIT/ML IJ SOLN
5000.0000 [IU] | Freq: Three times a day (TID) | INTRAMUSCULAR | Status: DC
  Administered 2014-02-17 – 2014-02-19 (×5): 5000 [IU] via SUBCUTANEOUS
  Filled 2014-02-17 (×8): qty 1

## 2014-02-17 MED ORDER — LEVOTHYROXINE SODIUM 150 MCG PO TABS
150.0000 ug | ORAL_TABLET | Freq: Every day | ORAL | Status: DC
  Administered 2014-02-18 – 2014-02-19 (×2): 150 ug via ORAL
  Filled 2014-02-17 (×3): qty 1

## 2014-02-17 MED ORDER — ONDANSETRON HCL 4 MG/2ML IJ SOLN: 4.0000 mg | Freq: Once | INTRAMUSCULAR | Status: DC

## 2014-02-17 MED ORDER — ONDANSETRON HCL 4 MG PO TABS: 4.0000 mg | ORAL_TABLET | Freq: Four times a day (QID) | ORAL | Status: DC | PRN

## 2014-02-17 MED ORDER — HYDROMORPHONE HCL PF 1 MG/ML IJ SOLN
0.5000 mg | INTRAMUSCULAR | Status: DC | PRN
  Administered 2014-02-18: 0.5 mg via INTRAVENOUS
  Filled 2014-02-17: qty 1

## 2014-02-17 MED ORDER — ASPIRIN-DIPYRIDAMOLE ER 25-200 MG PO CP12
1.0000 | ORAL_CAPSULE | Freq: Two times a day (BID) | ORAL | Status: DC
  Administered 2014-02-17 – 2014-02-19 (×4): 1 via ORAL
  Filled 2014-02-17 (×5): qty 1

## 2014-02-17 MED ORDER — MORPHINE SULFATE 4 MG/ML IJ SOLN
4.0000 mg | Freq: Once | INTRAMUSCULAR | Status: DC
Start: 2014-02-17 — End: 2014-02-17

## 2014-02-17 MED ORDER — ACETAMINOPHEN 325 MG PO TABS
650.0000 mg | ORAL_TABLET | Freq: Four times a day (QID) | ORAL | Status: DC | PRN
  Administered 2014-02-18 (×2): 650 mg via ORAL
  Filled 2014-02-17 (×2): qty 2

## 2014-02-17 MED ORDER — ONDANSETRON HCL 4 MG/2ML IJ SOLN: 4.0000 mg | Freq: Four times a day (QID) | INTRAMUSCULAR | Status: DC | PRN

## 2014-02-17 MED ORDER — MUPIROCIN 2 % EX OINT
1.0000 "application " | TOPICAL_OINTMENT | Freq: Two times a day (BID) | CUTANEOUS | Status: DC
  Administered 2014-02-17 – 2014-02-19 (×4): 1 via NASAL
  Filled 2014-02-17 (×2): qty 22

## 2014-02-17 NOTE — ED Provider Notes (Signed)
CSN: [id number]     Arrival date & time 02/17/14  6644 History   First MD Initiated Contact with Patient 02/17/14 1008     Chief Complaint  Patient presents with  . Chest Pain  . Shortness of Breath     (Consider location/radiation/quality/duration/timing/severity/associated sxs/prior Treatment) HPI A LEVEL 5 CAVEAT PERTAINS DUE TO DEMENTIA. Pt presenting with c/o chest pain.  Pt lives in a SNF and per report she had an episode of weakness and slid to the ground- no significant fall, no LOC. Since that time she has been c/o midsternal chest pressure and shortness of breath.  She is also complaining of neck and back pain.   Past Medical History  Diagnosis Date  . Peripheral neuropathy   . Hypertension   . TIA (transient ischemic attack)   . Elevated sed rate   . Temporal arteritis     chronic steroids  . Hypothyroidism   . Colon cancer     colon  . History of hernia repair   . S/P rotator cuff surgery     right  . Diastolic CHF   . Cardiomegaly - hypertensive   . Chronic anemia   . Chronic steroid use   . Dementia   . Shortness of breath   . Hyperlipidemia   . CHF (congestive heart failure)   . Cardiomyopathy due to hypertension   . Dependence on supplemental oxygen   . Chronic UTI   . Dementia   . Giant cell arteritis    Past Surgical History  Procedure Laterality Date  . Tonsillectomy  1939  . Colectomy  1987  . Abdominal hysterectomy  1974  . Appendectomy  1947  . Hernia repair  1990  . Cholecystectomy  2000  . Rotator cuff repair  2004  . Right temporal artery biopsy  10/1998, 10/2009  . Right cataract extraction  2011  . Eye surgery Bilateral   . Lumbar puncture  10/07/2004   Family History  Problem Relation Age of Onset  . Dementia Mother   . Heart attack Father    History  Substance Use Topics  . Smoking status: Former Research scientist (life sciences)  . Smokeless tobacco: Former Systems developer  . Alcohol Use: No   OB History   Grav Para Term Preterm Abortions TAB SAB Ect Mult  Living                 Review of Systems ROS reviewed and all otherwise negative except for mentioned in HPI    Allergies  Ceftriaxone; Ciprofloxacin; Codeine; Demerol; Metronidazole; Morphine and related; Nitrofurantoin monohyd macro; Pregabalin; and Septra  Home Medications   Prior to Admission medications   Medication Sig Start Date End Date Taking? Authorizing Provider  acetaminophen (TYLENOL) 325 MG tablet Take 650 mg by mouth every 6 (six) hours as needed for mild pain.    Yes Historical Provider, MD  alum & mag hydroxide-simeth (MAALOX/MYLANTA) 200-200-20 MG/5ML suspension Take 30 mLs by mouth every 6 (six) hours as needed for indigestion or heartburn.   Yes Historical Provider, MD  amLODipine (NORVASC) 5 MG tablet Take 5 mg by mouth every morning.   Yes Historical Provider, MD  BETA CAROTENE PO Take 1 capsule by mouth daily.   Yes Historical Provider, MD  Cranberry 250 MG CAPS Take 1 capsule by mouth 2 (two) times daily.   Yes Historical Provider, MD  dipyridamole-aspirin (AGGRENOX) 200-25 MG per 12 hr capsule Take 1 capsule by mouth 2 (two) times daily.   Yes Historical Provider,  MD  donepezil (ARICEPT) 10 MG tablet Take 10 mg by mouth at bedtime.   Yes Historical Provider, MD  ferrous sulfate 325 (65 FE) MG tablet Take 325 mg by mouth daily with breakfast.   Yes Historical Provider, MD  hydrALAZINE (APRESOLINE) 10 MG tablet Take 1 tablet (10 mg total) by mouth every 8 (eight) hours. 12/09/12  Yes Nishant Dhungel, MD  levothyroxine (SYNTHROID, LEVOTHROID) 150 MCG tablet Take 150 mcg by mouth daily before breakfast.   Yes Historical Provider, MD  loperamide (IMODIUM) 2 MG capsule Take 2 mg by mouth as needed for diarrhea or loose stools.   Yes Historical Provider, MD  omeprazole (PRILOSEC) 20 MG capsule Take 20 mg by mouth 2 (two) times daily before a meal.    Yes Historical Provider, MD  ondansetron (ZOFRAN) 4 MG tablet Take 1 tablet (4 mg total) by mouth every 6 (six) hours as  needed for nausea. 07/24/13  Yes Kelvin Cellar, MD  potassium chloride SA (K-DUR,KLOR-CON) 20 MEQ tablet Take 20 mEq by mouth 2 (two) times daily.   Yes Historical Provider, MD  predniSONE (DELTASONE) 10 MG tablet Take 10 mg by mouth every other day. Alternating with 15mg  dose   Yes Historical Provider, MD  predniSONE (DELTASONE) 5 MG tablet Take 15 mg by mouth every other day. Alternating with 10 mg   Yes Historical Provider, MD  saccharomyces boulardii (FLORASTOR) 250 MG capsule Take 250 mg by mouth 2 (two) times daily.   Yes Historical Provider, MD  sertraline (ZOLOFT) 50 MG tablet Take 50 mg by mouth every morning.    Yes Historical Provider, MD   BP 182/80  Pulse 88  Temp(Src) 98.4 F (36.9 C) (Oral)  Resp 18  Ht 5\' 9"  (1.753 m)  Wt 200 lb 2.8 oz (90.8 kg)  BMI 29.55 kg/m2  SpO2 96% Vitals reviewed Physical Exam Physical Examination: General appearance - alert, well appearing, and in no distress Mental status - alert, oriented to person, place, and time Eyes - no conjunctival injection, no scleral icterus Mouth - mucous membranes moist, pharynx normal without lesions Chest - clear to auscultation, no wheezes, rales or rhonchi, symmetric air entry Heart - normal rate, regular rhythm, normal S1, S2, no murmurs, rubs, clicks or gallops Abdomen - soft, nontender, nondistended, no masses or organomegaly Extremities - peripheral pulses normal, no pedal edema, no clubbing or cyanosis Skin - normal coloration and turgor, no rashes  ED Course  Procedures (including critical care time)  2:02 PM d/w internal medicine teaching service- who will see the patient in the ED for admission.   Labs Review Labs Reviewed  MRSA PCR SCREENING - Abnormal; Notable for the following:    MRSA by PCR POSITIVE (*)    All other components within normal limits  CBC - Abnormal; Notable for the following:    WBC 12.1 (*)    RDW 16.7 (*)    All other components within normal limits  BASIC METABOLIC  PANEL - Abnormal; Notable for the following:    Creatinine, Ser 1.13 (*)    GFR calc non Af Amer 44 (*)    GFR calc Af Amer 51 (*)    All other components within normal limits  PRO B NATRIURETIC PEPTIDE - Abnormal; Notable for the following:    Pro B Natriuretic peptide (BNP) 833.0 (*)    All other components within normal limits  URINALYSIS, ROUTINE W REFLEX MICROSCOPIC - Abnormal; Notable for the following:    APPearance TURBID (*)  Hgb urine dipstick MODERATE (*)    Nitrite POSITIVE (*)    Leukocytes, UA LARGE (*)    All other components within normal limits  TSH - Abnormal; Notable for the following:    TSH 0.096 (*)    All other components within normal limits  COMPREHENSIVE METABOLIC PANEL - Abnormal; Notable for the following:    Creatinine, Ser 1.17 (*)    Albumin 2.9 (*)    GFR calc non Af Amer 42 (*)    GFR calc Af Amer 49 (*)    All other components within normal limits  CBC - Abnormal; Notable for the following:    RDW 17.1 (*)    All other components within normal limits  URINE MICROSCOPIC-ADD ON - Abnormal; Notable for the following:    Bacteria, UA MANY (*)    Casts HYALINE CASTS (*)    All other components within normal limits  URINE CULTURE  TROPONIN I  TROPONIN I  TROPONIN I  TROPONIN I    Imaging Review No results found.   EKG Interpretation   Date/Time:  Monday Feb 17 2014 10:04:10 EDT Ventricular Rate:  75 PR Interval:  120 QRS Duration: 184 QT Interval:  502 QTC Calculation: 561 R Axis:   7 Text Interpretation:  Ectopic atrial rhythm Atrial premature complex Right  bundle branch block No significant change since last tracing Confirmed by  [facility]  MD, Rufus Beske [phone]) on 02/17/2014 2:35:22 PM      MDM   Final diagnoses:  Chest pain  Lumbar pain  Cervical spine pain  Fall    Pt presenting with c/o chest pain after episode of weakness today.  EKG and troponin reassuring.  Pt continues to c/o chest pain after nitro, has gotten aspirin.   D/w resident team for admission.      Threasa Beards, MD 02/20/14 [phone]

## 2014-02-17 NOTE — ED Notes (Signed)
MD at bedside. 

## 2014-02-17 NOTE — ED Notes (Signed)
Attempted to call report

## 2014-02-17 NOTE — ED Notes (Signed)
Daughter refers pt was very weak this morning one of the staff from assisting [facility]iven try to help her out of the chair and pt faith, the guided her to the floor. And pt started c/o CP 10/10 and SOB.

## 2014-02-17 NOTE — H&P (Signed)
Date: 02/17/2014               Patient Name:  [name] MRN: [id number]  DOB: 1930-01-12 Age / Sex: 80 y.o., female   PCP: Sande Brothers, MD         Medical Service: Internal Medicine Teaching Service         Attending Physician: Dr. Annia Belt, MD    First Contact: Dr. Marrion Coy Pager: [phone]   Second Contact: Dr. Clinton Gallant  Pager: [phone]       After Hours (After 5p/  First Contact Pager: [phone]  weekends / holidays): Second Contact Pager: [phone]   Chief Complaint: chest pain  History of Present Illness: [name] is a 80 year old woman with a PMH of dHF (EF 65-70%, Jan 2015), CKD3, HLD, depression, dementia, colon CA, temporal arteritis, HTN, and TIA.  She presents to [facility] via EMS with c/o chest pain, B/L shoulder/arm pain and B/L neck pain.  History is reported by patients daughter who was not present for the event, which occurred at the patient's SNF.  She reportedly slid out of a chair while be assisted and was caught before she hit the ground.  Patient says she landed on her bottom.  She immediately began to feel 5/10 sharp, non-pleuritic central chest pain and dyspnea.  EMS was called.  Her pain did not respond to ASA. NTG in the ED helped.  She has not had chest pain before.  She takes a PPI for GERD but denies reflux symptoms.  She denies cough or cold symptoms recently.  Her chest pain has resolved by the time of this providers exam.    In the ED:  T 97.67F, RR 20, SpO2 90s-100 on 2L, HR 50-90s, BP 161/84mmHg; she received NTG and Toradol.  Meds: Current Facility-Administered Medications  Medication Dose Route Frequency Provider Last Rate Last Dose  . nitroGLYCERIN (NITROSTAT) SL tablet 0.4 mg  0.4 mg Sublingual Q5 min PRN Threasa Beards, MD   0.4 mg at 02/17/14 1316  . ondansetron (ZOFRAN) injection 4 mg  4 mg Intravenous Once Threasa Beards, MD       Current Outpatient Prescriptions  Medication Sig Dispense Refill  . acetaminophen  (TYLENOL) 325 MG tablet Take 650 mg by mouth every 6 (six) hours as needed for mild pain.       Marland Kitchen alum & mag hydroxide-simeth (MAALOX/MYLANTA) 200-200-20 MG/5ML suspension Take 30 mLs by mouth every 6 (six) hours as needed for indigestion or heartburn.      Marland Kitchen amLODipine (NORVASC) 5 MG tablet Take 5 mg by mouth every morning.      Marland Kitchen BETA CAROTENE PO Take 1 capsule by mouth daily.      . Cranberry 250 MG CAPS Take 1 capsule by mouth 2 (two) times daily.      Marland Kitchen dipyridamole-aspirin (AGGRENOX) 200-25 MG per 12 hr capsule Take 1 capsule by mouth 2 (two) times daily.      Marland Kitchen donepezil (ARICEPT) 10 MG tablet Take 10 mg by mouth at bedtime.      . ferrous sulfate 325 (65 FE) MG tablet Take 325 mg by mouth daily with breakfast.      . hydrALAZINE (APRESOLINE) 10 MG tablet Take 1 tablet (10 mg total) by mouth every 8 (eight) hours.  90 tablet  0  . levothyroxine (SYNTHROID, LEVOTHROID) 150 MCG tablet Take 150 mcg by mouth daily before breakfast.      . loperamide (IMODIUM)  2 MG capsule Take 2 mg by mouth as needed for diarrhea or loose stools.      Marland Kitchen omeprazole (PRILOSEC) 20 MG capsule Take 20 mg by mouth 2 (two) times daily before a meal.       . ondansetron (ZOFRAN) 4 MG tablet Take 1 tablet (4 mg total) by mouth every 6 (six) hours as needed for nausea.  20 tablet  0  . potassium chloride SA (K-DUR,KLOR-CON) 20 MEQ tablet Take 20 mEq by mouth 2 (two) times daily.      . predniSONE (DELTASONE) 10 MG tablet Take 10 mg by mouth every other day. Alternating with 15mg  dose      . predniSONE (DELTASONE) 5 MG tablet Take 15 mg by mouth every other day. Alternating with 10 mg      . saccharomyces boulardii (FLORASTOR) 250 MG capsule Take 250 mg by mouth 2 (two) times daily.      . sertraline (ZOLOFT) 50 MG tablet Take 50 mg by mouth every morning.         Allergies: Allergies as of 02/17/2014 - Review Complete 02/17/2014  Allergen Reaction Noted  . Ceftriaxone Itching 07/19/2013  . Ciprofloxacin Nausea And  Vomiting 09/28/2011  . Codeine Other (See Comments) 09/28/2011  . Demerol Other (See Comments) 09/28/2011  . Metronidazole Itching 07/19/2013  . Morphine and related Other (See Comments) 09/28/2011  . Nitrofurantoin monohyd macro Other (See Comments) 09/02/2012  . Pregabalin Other (See Comments) 09/28/2011  . Septra [bactrim] Other (See Comments) 09/28/2011   Past Medical History  Diagnosis Date  . Peripheral neuropathy   . Hypertension   . TIA (transient ischemic attack)   . Elevated sed rate   . Temporal arteritis     chronic steroids  . Hypothyroidism   . Colon cancer     colon  . History of hernia repair   . S/P rotator cuff surgery     right  . Diastolic CHF   . Cardiomegaly - hypertensive   . Chronic anemia   . Chronic steroid use   . Dementia   . Shortness of breath   . Hyperlipidemia   . CHF (congestive heart failure)   . Cardiomyopathy due to hypertension   . Dependence on supplemental oxygen   . Chronic UTI   . Dementia   . Giant cell arteritis    Past Surgical History  Procedure Laterality Date  . Tonsillectomy  1939  . Colectomy  1987  . Abdominal hysterectomy  1974  . Appendectomy  1947  . Hernia repair  1990  . Cholecystectomy  2000  . Rotator cuff repair  2004  . Right temporal artery biopsy  10/1998, 10/2009  . Right cataract extraction  2011  . Eye surgery Bilateral   . Lumbar puncture  10/07/2004   Family History  Problem Relation Age of Onset  . Dementia Mother   . Heart attack Father    History   Social History  . Marital Status: Widowed    Spouse Name: N/A    Number of Children: 2  . Years of Education: 13   Occupational History  . RETIRED    Social History Main Topics  . Smoking status: Former Research scientist (life sciences)  . Smokeless tobacco: Former Systems developer  . Alcohol Use: No  . Drug Use: No  . Sexual Activity: Not Currently   Other Topics Concern  . Not on file   Social History Narrative   Widowed.  Has lived with her daughter for the past  2.5  years, but most recently d/c'd to ALF/SNF.  Ambulates with a walker.      Patient lives in assisted living [place].   Retired.   Education- one year of college.   Right handed- Caffeine one cup of coffee daily.    Review of Systems: Pertinent items are noted in HPI. Per patient's daughter:  No recent change in appetite or behavior.  In addition to symptoms above, the patient reports dysuria.  Physical Exam: Blood pressure 166/62, pulse 71, temperature 97.9 F (36.6 C), temperature source Oral, resp. rate 13, SpO2 94.00%. General: resting in bed in NAD HEENT: irregularly shaped pupils (s/p B/L cataract surgery), pupils non-responsive, EOMI, oropharynx clear Cardiac: RRR, no rubs, murmurs or gallops; chest is TTP  Pulm: clear to auscultation bilaterally, moving normal volumes of air Abd: soft, nondistended, BS present, lower abdominal/suprapubic pain Ext: warm and well perfused, no pedal edema MSK:  B/L shoulders are TTP; knees, elbows are nontender; limited ROM of right shoulder (s/p rotator cuff surgery), she is able to extend the left shoulder. Neuro: alert and oriented X3, cranial nerves II-XII grossly intact, strength equal and intact B/L upper and lower extremities, sensation intact B/L upper extremities, decreased sensation B/L lower extremities (unchanged baseline peripheral neuropathy)  Lab results: Basic Metabolic Panel:  Recent Labs  02/17/14 1110  NA 141  K 4.7  CL 104  CO2 23  GLUCOSE 99  BUN 19  CREATININE 1.13*  CALCIUM 9.1   CBC:  Recent Labs  02/17/14 1125  WBC 12.1*  HGB 12.6  HCT 39.4  MCV 88.9  PLT 251   Cardiac Enzymes:  Recent Labs  02/17/14 1110  TROPONINI <0.30   BNP:  Recent Labs  02/17/14 1110  PROBNP 833.0*   Imaging results:  Dg Chest 2 View  02/17/2014   CLINICAL DATA:  Chest pain, shortness of breath.  EXAM: CHEST  2 VIEW  COMPARISON:  October 28, 2013.  FINDINGS: Stable cardiomegaly. No pneumothorax is noted.  Right lung is clear. Blunting of left costophrenic sulcus is noted which is unchanged compared to prior exam and most consistent with scarring. No definite pleural effusion is noted. No acute pulmonary disease is noted. Bony thorax is intact.  IMPRESSION: No acute cardiopulmonary abnormality seen.   Electronically Signed   By: Sabino Dick M.D.   On: 02/17/2014 12:35   Dg Cervical Spine Complete  02/17/2014   CLINICAL DATA:  Neck pain.  EXAM: CERVICAL SPINE  4+ VIEWS  COMPARISON:  [facility] image for head CT dated 03/08/2012  FINDINGS: There is a 2 mm anterolisthesis of C3 on C4 which was not visible on the prior study. Patient does have severe bilateral facet arthritis at C2-3, C3-4 and C4-5. The lower cervical spine is not well seen on the lateral view. No prevertebral soft tissue swelling.  IMPRESSION: No acute osseous abnormality. New slight anterolisthesis of C3 on C4 which is felt to be due to degenerative facet arthritis.   Electronically Signed   By: Rozetta Nunnery M.D.   On: 02/17/2014 12:50   Dg Lumbar Spine Complete  02/17/2014   CLINICAL DATA:  Low back pain.  EXAM: LUMBAR SPINE - COMPLETE 4+ VIEW  COMPARISON:  CT scan dated 07/19/2013  FINDINGS: There is no fracture or bone destruction. There is chronic degenerative disc disease at L4-5 and L5-S1 with disc space narrowing and vacuum phenomena, unchanged. No subluxation. Slight degenerative facet arthritis at L4-5 and L5-S1.  IMPRESSION: No acute abnormalities. Degenerative  disc and joint disease in the lower lumbar spine.   Electronically Signed   By: Rozetta Nunnery M.D.   On: 02/17/2014 12:51    Other results: EKG: 75 bpm, ectopic atrial rhythm, RBBB, similar to prior tracing.  Assessment & Plan by Problem: 80 year old woman with a PMH of dHF (EF 65-70%), CKD3, HLD, depression, dementia, colon CA, temporal arteritis, HTN, and TIA who presents with chest pain.  Atypical chest pain:  The patient experience chest pain at rest, described as sharp,  reproducible on exam.  These findings would be atypical for cardiac pain.  It sounds as if someone reached underneath and caught her as she was about to fall (she has peripheral neuropathy which limits her mobility).  Her chest is very tender, more c/w MSK etiology that cardiac etiology.  Initial troponin negative and EKG without ischemic changes, however given her age, risk factor and the fact that ACS can present atypically in women will need to rule out ACS.  PE unlikely given Well's score of zero.  Doubt pneumonia given lack of cough/cold symptoms in preceding days, no fever or infiltrate on CXR.  No fracture or acute abnormality on cervical/lumbar xrays. - admit with telemetry monitoring - CE x 3 - AM EKG - pain control - dilaudid (morphine allergy); NSAID would be better, but patient with CKD. - NTG prn - continue Aggrenox - monitor CBC  Shoulder pain:  Hx of rotator cuff injury requiring surgery (right shoulder).  Her current shoulder complaints are likely MSK in nature. - pain control as above  Dysuria:  Patient with incontinence at baseline.  Wears adult diapers and has hx of UTIs. She reports burning at times. - check UA and urine culture  CKD3:  Baseline Cr 1.1-1.2. Stable. - monitor BMP  dHF (EF 65-70%, Jan 2015):  ECHO c/w diastolic dysfunction.  Stable. proBNP 833 but has been > 8000 before.   - daily weights  HTN:  Home medications - amlodipine 5mg  and hydralazine 10mg  q8h.  Stable.  Hypothyroidism: home medication - synthroid 176mcg daily. - continue Synthroid   Hx of temporal arteritis: Home medications - alternates between 10mg  and 15mg  of Prednisone each day.  Likely reason for slightly elevated WBC of 12.1. continue Prednisone (alternating doses)  Dementia:  Home medication - Aricept  - continue Aricept  Depression:  Home medication - Zoloft - continue Zoloft  Dispo: Disposition is deferred at this time, awaiting improvement of current medical problems.  Anticipated discharge in approximately 1 day(s).   The patient does have a current PCP (Sande Brothers, MD) and does not need an [facility] hospital follow-up appointment after discharge.  The patient does not know have transportation limitations that hinder transportation to clinic appointments.  Signed: Duwaine Maxin, DO 02/17/2014, 3:31 PM  Attending Physician Admission Note: Patient interviewed and examined.  Database and EKG/Radiographs reviewed.  Management discussed with resident physician Dr Duwaine Maxin and Marrion Coy. 80  Year old woman transported from [place] with complaints of weakness, dyspnea,  and chest pain. She had a near fall at the nursing facility and an attendant grabbed her around the chest to keep her from falling.  PMH remarkable for mild dementia, hypothyroidism, temporal arteritis on chronic prednisone, hypertension, previous TIA. No significant cardiac disease other than HTN. Exam: BP 147/58  Pulse 80  Temp(Src) 99 F (37.2 C) (Oral)  Resp 22  Ht 5\' 9"  (1.753 m)  Wt 202 lb 7.5 oz (91.84 kg)  BMI 29.89 kg/m2  SpO2 96% She is alert & oriented, NAD Left iridectomy Right shoulder scar from prior rotator cuff surgery Lungs clear, heart regular, no murmur,or gallop, no edema, neuro: non focal: alert, oriented, follows all commands appropriately, motor strength  5/5 all extremities  Musculoskeletal: tender on palpation over chest wall R>L ; tender on palpation over right shoulder joint but full ROM at joint and good strength.  EKG: no acute ischemic change  Troponin: not elevated  Impression: 1: Chest/shoulder pain: musculoskeletal.  No suspicion of cardiac etiology Plan: Rx symptomatically 2. Asymptomatic Urinary Tract Infection Cipro started. Would keep Rx interval brief in view of Hx of recurrent C dificile colitis. 3. Hx Temporal arteritis on chronic steroids 4. Hypothyroid on replacement 5. Mild dementia 6. Hx TIA 7. Essential HTN  Should be  able to return to nursing facility soon.

## 2014-02-17 NOTE — ED Notes (Signed)
Patient transported to X-ray 

## 2014-02-17 NOTE — ED Notes (Addendum)
Pt brought by EMS, pt c/o of chest pain that radiates to bilateral shoulders and SOB, pt refers she fell out of her recliner while some family member try to help her get up, pt refers she hit her lower back. Pt denies any nausea or vomiting no change on bladder or BM. 4 Aspirin 81 mg given by EMS no nitro given due to no IV site. Pt has hx of COP and PVD.

## 2014-02-18 ENCOUNTER — Observation Stay (HOSPITAL_COMMUNITY): Payer: Medicare Other

## 2014-02-18 DIAGNOSIS — M25519 Pain in unspecified shoulder: Secondary | ICD-10-CM

## 2014-02-18 DIAGNOSIS — N183 Chronic kidney disease, stage 3 unspecified: Secondary | ICD-10-CM

## 2014-02-18 DIAGNOSIS — E039 Hypothyroidism, unspecified: Secondary | ICD-10-CM

## 2014-02-18 DIAGNOSIS — R0789 Other chest pain: Secondary | ICD-10-CM

## 2014-02-18 DIAGNOSIS — I5030 Unspecified diastolic (congestive) heart failure: Secondary | ICD-10-CM

## 2014-02-18 DIAGNOSIS — F0390 Unspecified dementia without behavioral disturbance: Secondary | ICD-10-CM

## 2014-02-18 DIAGNOSIS — F3289 Other specified depressive episodes: Secondary | ICD-10-CM

## 2014-02-18 DIAGNOSIS — I129 Hypertensive chronic kidney disease with stage 1 through stage 4 chronic kidney disease, or unspecified chronic kidney disease: Secondary | ICD-10-CM

## 2014-02-18 DIAGNOSIS — R300 Dysuria: Secondary | ICD-10-CM

## 2014-02-18 DIAGNOSIS — F329 Major depressive disorder, single episode, unspecified: Secondary | ICD-10-CM

## 2014-02-18 LAB — COMPREHENSIVE METABOLIC PANEL
ALBUMIN: 2.9 g/dL — AB (ref 3.5–5.2)
ALT: 14 U/L (ref 0–35)
AST: 19 U/L (ref 0–37)
Alkaline Phosphatase: 80 U/L (ref 39–117)
BUN: 23 mg/dL (ref 6–23)
CALCIUM: 9.4 mg/dL (ref 8.4–10.5)
CO2: 27 mEq/L (ref 19–32)
CREATININE: 1.17 mg/dL — AB (ref 0.50–1.10)
Chloride: 104 mEq/L (ref 96–112)
GFR calc Af Amer: 49 mL/min — ABNORMAL LOW (ref >90)
GFR calc non Af Amer: 42 mL/min — ABNORMAL LOW (ref >90)
Glucose, Bld: 80 mg/dL (ref 70–99)
Potassium: 4.6 mEq/L (ref 3.7–5.3)
Sodium: 142 mEq/L (ref 137–147)
TOTAL PROTEIN: 7.1 g/dL (ref 6.0–8.3)
Total Bilirubin: 0.3 mg/dL (ref 0.3–1.2)

## 2014-02-18 LAB — URINALYSIS, ROUTINE W REFLEX MICROSCOPIC
BILIRUBIN URINE: NEGATIVE
GLUCOSE, UA: NEGATIVE mg/dL
Ketones, ur: NEGATIVE mg/dL
Nitrite: POSITIVE — AB
PH: 6 (ref 5.0–8.0)
Protein, ur: NEGATIVE mg/dL
Specific Gravity, Urine: 1.02 (ref 1.005–1.030)
Urobilinogen, UA: 0.2 mg/dL (ref 0.0–1.0)

## 2014-02-18 LAB — CBC
HCT: 38.4 % (ref 36.0–46.0)
HEMOGLOBIN: 12 g/dL (ref 12.0–15.0)
MCH: 28.4 pg (ref 26.0–34.0)
MCHC: 31.3 g/dL (ref 30.0–36.0)
MCV: 90.8 fL (ref 78.0–100.0)
Platelets: 246 10*3/uL (ref 150–400)
RBC: 4.23 MIL/uL (ref 3.87–5.11)
RDW: 17.1 % — ABNORMAL HIGH (ref 11.5–15.5)
WBC: 7.4 10*3/uL (ref 4.0–10.5)

## 2014-02-18 LAB — URINE MICROSCOPIC-ADD ON

## 2014-02-18 LAB — TROPONIN I
Troponin I: <0.3 ng/mL (ref <0.30)
Troponin I: <0.3 ng/mL (ref <0.30)

## 2014-02-18 MED ORDER — CIPROFLOXACIN IN D5W 200 MG/100ML IV SOLN
200.0000 mg | Freq: Two times a day (BID) | INTRAVENOUS | Status: DC
  Administered 2014-02-18 – 2014-02-19 (×3): 200 mg via INTRAVENOUS
  Filled 2014-02-18 (×6): qty 100

## 2014-02-18 MED ORDER — HYDROCODONE-ACETAMINOPHEN 5-325 MG PO TABS: 1.0000 | ORAL_TABLET | Freq: Four times a day (QID) | ORAL | Status: DC | PRN

## 2014-02-18 NOTE — Progress Notes (Addendum)
ANTIBIOTIC CONSULT NOTE - INITIAL  Pharmacy Consult for Cipro Indication:  UTI  Allergies  Allergen Reactions  . Ceftriaxone Itching  . Ciprofloxacin Nausea And Vomiting  . Codeine Other (See Comments)    halucinations  . Demerol Other (See Comments)    halucinations  . Metronidazole Itching  . Morphine And Related Other (See Comments)    halucinations  . Nitrofurantoin Monohyd Macro Other (See Comments)  . Pregabalin Other (See Comments)    unknown  . Septra [Bactrim] Other (See Comments)    Unknown cant tolerate DS   Patient Measurements: Height: 5\' 9"  (175.3 cm) Weight: 204 lb 5.9 oz (92.7 kg) (bed scale) IBW/kg (Calculated) : 66.2  Vital Signs: Temp: 98 F (36.7 C) (05/26 0201) Temp src: Oral (05/26 0201) BP: 141/47 mmHg (05/26 0300) Pulse Rate: 78 (05/26 0201) Intake/Output from previous day: 05/25 0701 - 05/26 0700 In: 120 [P.O.:120] Out: -  Intake/Output from this shift: Total I/O In: 120 [P.O.:120] Out: -   Labs:  Recent Labs  02/17/14 1110 02/17/14 1125  WBC  --  12.1*  HGB  --  12.6  PLT  --  251  CREATININE 1.13*  --    Estimated Creatinine Clearance: 46.5 ml/min (by C-G formula based on Cr of 1.13).  Microbiology: Recent Results (from the past 720 hour(s))  MRSA PCR SCREENING     Status: Abnormal   Collection Time    02/17/14  8:54 PM      Result Value Ref Range Status   MRSA by PCR POSITIVE (*) NEGATIVE Final   Comment:            The GeneXpert MRSA Assay (FDA     approved for NASAL specimens     only), is one component of a     comprehensive MRSA colonization     surveillance program. It is not     intended to diagnose MRSA     infection nor to guide or     monitor treatment for     MRSA infections.     RESULT CALLED TO, READ BACK BY AND VERIFIED WITH:     BUENDIA,A RN [id number] AT 2253 SKEEN,P   Medical History: Past Medical History  Diagnosis Date  . Peripheral neuropathy   . Hypertension   . TIA (transient ischemic attack)    . Elevated sed rate   . Temporal arteritis     chronic steroids  . Hypothyroidism   . Colon cancer     colon  . History of hernia repair   . S/P rotator cuff surgery     right  . Diastolic CHF   . Cardiomegaly - hypertensive   . Chronic anemia   . Chronic steroid use   . Dementia   . Shortness of breath   . Hyperlipidemia   . CHF (congestive heart failure)   . Cardiomyopathy due to hypertension   . Dependence on supplemental oxygen   . Chronic UTI   . Dementia   . Giant cell arteritis    Medications:  Anti-infectives   Start     Dose/Rate Route Frequency Ordered Stop   02/18/14 0600  ciprofloxacin (CIPRO) IVPB 200 mg     200 mg 100 mL/hr over 60 Minutes Intravenous Every 12 hours 02/18/14 0509       Assessment: 80yo female admitted with chest pain and s/s of UTI.  She is being placed on IV Cipro.  She is intolerant of oral Cipro due to N&V.  She has a creatinine of 1.13 and an estimated crcl of 47 ml/min.  She has mild leukocytosis with WBC = 12.1K.  Goal of Therapy:  Therapeutic response to IV Cipro  Plan:  1.  Will begin IV Cipro 200mg  every 12 hours 2.  F/U cultures, renal function and clinical response  Rober Minion, PharmD., MS Clinical Pharmacist Pager:  [phone] Thank you for allowing pharmacy to be part of this patients care team. 02/18/2014,5:09 AM   Pharmacy to sign off -- re-consult if needed Will follow peripherally  Thank you. Anette Guarneri, PharmD

## 2014-02-18 NOTE — Progress Notes (Signed)
Dr. Eulas Post was notified about the result of U/A sent earlier. Will continue to monitor pt

## 2014-02-18 NOTE — Progress Notes (Signed)
UR completed 

## 2014-02-18 NOTE — Progress Notes (Signed)
Subjective:  NAEON. Chest pain is improved "not so bad". Patient continues to complain of left shoulder pain and difficulty using her left arm. This feels similar to when she tore her right rotator cuff.  Objective: Vital signs in last 24 hours: Filed Vitals:   02/17/14 2046 02/18/14 0201 02/18/14 0300 02/18/14 0653  BP: 151/44 168/57 141/47 147/58  Pulse: 68 78  80  Temp: 98.4 F (36.9 C) 98 F (36.7 C)  99 F (37.2 C)  TempSrc: Oral Oral  Oral  Resp: 18 20  22   Height:      Weight:    202 lb 7.5 oz (91.84 kg)  SpO2: 93% 95%  96%   Weight change:   Intake/Output Summary (Last 24 hours) at 02/18/14 0900 Last data filed at 02/18/14 0609  Gross per 24 hour  Intake    340 ml  Output     50 ml  Net    290 ml   Physical Exam  Constitutional: She is oriented to person, place, and time. She appears well-developed and well-nourished. No distress.  Abdominal: There is tenderness.  Mild suprapubic tederness  Musculoskeletal: She exhibits tenderness. She exhibits no edema.  Tender anterior chest wall.  Tender anterior shoulder (left).Full strength on flexion. Pain on extension. Poor strength on supination of left forearm.  Neurological: She is alert and oriented to person, place, and time. No cranial nerve deficit. Coordination normal.  Skin: She is not diaphoretic.  Psychiatric: She has a normal mood and affect. Her behavior is normal.     Lab Results: Basic Metabolic Panel:  Recent Labs Lab 02/17/14 1110 02/18/14 0740  NA 141 142  K 4.7 4.6  CL 104 104  CO2 23 27  GLUCOSE 99 80  BUN 19 23  CREATININE 1.13* 1.17*  CALCIUM 9.1 9.4   Liver Function Tests:  Recent Labs Lab 02/18/14 0740  AST 19  ALT 14  ALKPHOS 80  BILITOT 0.3  PROT 7.1  ALBUMIN 2.9*   CBC:  Recent Labs Lab 02/17/14 1125 02/18/14 0740  WBC 12.1* PENDING  HGB 12.6 12.0  HCT 39.4 38.4  MCV 88.9 90.8  PLT 251 246   Cardiac Enzymes:  Recent Labs Lab 02/17/14 1955  02/18/14 0100 02/18/14 0740  TROPONINI <0.30 <0.30 <0.30   BNP:  Recent Labs Lab 02/17/14 1110  PROBNP 833.0*   Thyroid Function Tests:  Recent Labs Lab 02/17/14 1955  TSH 0.096*   Urinalysis:  Recent Labs Lab 02/18/14 0358  COLORURINE YELLOW  LABSPEC 1.020  PHURINE 6.0  GLUCOSEU NEGATIVE  HGBUR MODERATE*  BILIRUBINUR NEGATIVE  KETONESUR NEGATIVE  PROTEINUR NEGATIVE  UROBILINOGEN 0.2  NITRITE POSITIVE*  LEUKOCYTESUR LARGE*     Micro Results: Recent Results (from the past 240 hour(s))  MRSA PCR SCREENING     Status: Abnormal   Collection Time    02/17/14  8:54 PM      Result Value Ref Range Status   MRSA by PCR POSITIVE (*) NEGATIVE Final   Comment:            The GeneXpert MRSA Assay (FDA     approved for NASAL specimens     only), is one component of a     comprehensive MRSA colonization     surveillance program. It is not     intended to diagnose MRSA     infection nor to guide or     monitor treatment for     MRSA infections.  RESULT CALLED TO, READ BACK BY AND VERIFIED WITH:     BUENDIA,A RN [id number] AT 2253 SKEEN,P   Studies/Results: Dg Chest 2 View  02/17/2014   CLINICAL DATA:  Chest pain, shortness of breath.  EXAM: CHEST  2 VIEW  COMPARISON:  October 28, 2013.  FINDINGS: Stable cardiomegaly. No pneumothorax is noted. Right lung is clear. Blunting of left costophrenic sulcus is noted which is unchanged compared to prior exam and most consistent with scarring. No definite pleural effusion is noted. No acute pulmonary disease is noted. Bony thorax is intact.  IMPRESSION: No acute cardiopulmonary abnormality seen.   Electronically Signed   By: Sabino Dick M.D.   On: 02/17/2014 12:35   Dg Cervical Spine Complete  02/17/2014   CLINICAL DATA:  Neck pain.  EXAM: CERVICAL SPINE  4+ VIEWS  COMPARISON:  [facility] image for head CT dated 03/08/2012  FINDINGS: There is a 2 mm anterolisthesis of C3 on C4 which was not visible on the prior study. Patient does  have severe bilateral facet arthritis at C2-3, C3-4 and C4-5. The lower cervical spine is not well seen on the lateral view. No prevertebral soft tissue swelling.  IMPRESSION: No acute osseous abnormality. New slight anterolisthesis of C3 on C4 which is felt to be due to degenerative facet arthritis.   Electronically Signed   By: Rozetta Nunnery M.D.   On: 02/17/2014 12:50   Dg Lumbar Spine Complete  02/17/2014   CLINICAL DATA:  Low back pain.  EXAM: LUMBAR SPINE - COMPLETE 4+ VIEW  COMPARISON:  CT scan dated 07/19/2013  FINDINGS: There is no fracture or bone destruction. There is chronic degenerative disc disease at L4-5 and L5-S1 with disc space narrowing and vacuum phenomena, unchanged. No subluxation. Slight degenerative facet arthritis at L4-5 and L5-S1.  IMPRESSION: No acute abnormalities. Degenerative disc and joint disease in the lower lumbar spine.   Electronically Signed   By: Rozetta Nunnery M.D.   On: 02/17/2014 12:51   Medications: I have reviewed the patient's current medications. Scheduled Meds: . Chlorhexidine Gluconate Cloth  6 each Topical Q0600  . ciprofloxacin  200 mg Intravenous Q12H  . dipyridamole-aspirin  1 capsule Oral BID  . donepezil  10 mg Oral QHS  . heparin  5,000 Units Subcutaneous 3 times per day  . levothyroxine  150 mcg Oral QAC breakfast  . mupirocin ointment  1 application Nasal BID  . ondansetron  4 mg Intravenous Once  . predniSONE  10 mg Oral QODAY  . [START ON 02/19/2014] predniSONE  15 mg Oral QODAY  . saccharomyces boulardii  250 mg Oral BID  . sertraline  50 mg Oral Daily  . sodium chloride  3 mL Intravenous Q12H   Continuous Infusions:  PRN Meds:.acetaminophen, acetaminophen, HYDROmorphone (DILAUDID) injection, nitroGLYCERIN, ondansetron (ZOFRAN) IV, ondansetron Assessment/Plan: Principal Problem:   Chest pain Active Problems:   Chronic UTI   Hypothyroidism   S/P rotator cuff surgery   Chronic diastolic heart failure   Steroid long-term use   CKD  (chronic kidney disease) stage 3, GFR 30-64 ml/min  80 year old woman with a PMH of dHF (EF 65-70%), CKD3, HLD, depression, dementia, colon CA, temporal arteritis, HTN, and TIA who presents with chest pain.   MSK Chest Pain.: The patient experience chest pain at rest, described as sharp, reproducible on exam. It sounds as if someone reached underneath and caught her as she was falling. Her chest is very tender. No fracture or acute abnormality on cervical/lumbar  xrays.  - admit with telemetry monitoring  - CE x 3 (Neg) - AM EKG (no change)  - pain control - d/c dilaudid (morphine = hallucinations); NSAID would be better, but patient with CKD. Will treat with tylenol. May escalate to hydrocodone-apap prn - NTG prn   - continue Aggrenox   Shoulder pain: Hx of rotator cuff injury requiring surgery (right shoulder). Her current shoulder complaints are likely MSK in nature. Fracture appears unlikely, but will assess with shoulder xray. - f/u left shoulder xray - pain control as above  - PT, and outpatient management with ortho  UTI: Patient with incontinence at baseline. Wears adult diapers and has hx of UTIs. She reports burning at times and mild suprapubic tenderness. UA postive for hg, nitrite and leuks. Many bacteria on microscopy. - Started IV cipro, will deescalate pending sensitivities - f/u Urine culture  CKD3: Baseline Cr 1.1-1.2. Stable.  - monitor BMP   dHF (EF 65-70%, Jan 2015): ECHO c/w diastolic dysfunction. Stable.  - daily weights   HTN: Home medications - amlodipine 5mg  and hydralazine 10mg  q8h. Stable.   Hypothyroidism: home medication - synthroid 161mcg daily.  - continue Synthroid   Hx of temporal arteritis: Apparently the patient has biopsy confimed TA (2000). Plan to continue home medicines alternates between 10mg  and 15mg  of Prednisone each day.  Dementia: Home medication - Aricept  - continue Aricept   Depression: Home medication - Zoloft  - continue Zoloft    Diet: Regular  DVT: SCDs on aggrenox    Dispo: Disposition is deferred at this time, awaiting improvement of current medical problems.  Anticipated discharge in approximately 1-2 day(s).   The patient does have a current PCP (Sande Brothers, MD) and does not need an [facility] hospital follow-up appointment after discharge.  The patient does have transportation limitations that hinder transportation to clinic appointments.  .Services Needed at time of discharge: Y = Yes, Blank = No PT:   OT:   RN:   Equipment:   Other:     LOS: 1 day   Marrion Coy, MD 02/18/2014, 9:00 AM

## 2014-02-19 DIAGNOSIS — N390 Urinary tract infection, site not specified: Secondary | ICD-10-CM

## 2014-02-19 MED ORDER — CIPROFLOXACIN HCL 250 MG PO TABS: 250.0000 mg | ORAL_TABLET | Freq: Two times a day (BID) | ORAL | Status: DC

## 2014-02-19 NOTE — Progress Notes (Addendum)
[place] for d/c today per MD.  Phoebe Perch completed and sent to [facility]; document reviewed and approved by Crystal at ALF and ok given for patient to return to facility.  Patient's daughter Langley Gauss notified and planned to transport patient back to ALF by car.  CSW received a call from nursing stating that due to weakness; patient and daughter now agree to EMS transport for safety as patient cannot stand safely to get into a car.  02 in place as well. CSW discussed with patient, daughter and nursing. EMS transport requested with [facility]. Packet completed and awaiting transport. CSW signing off.  Lorie Phenix. [place], [place]

## 2014-02-19 NOTE — Progress Notes (Signed)
Subjective:  NAEON. Doing much better. Continues to complain of left shoulder pain.  Objective: Vital signs in last 24 hours: Filed Vitals:   02/18/14 1429 02/18/14 2005 02/19/14 0611 02/19/14 0632  BP: 126/46 167/47 174/80 139/52  Pulse: 86 51 89   Temp: 98.7 F (37.1 C) 97.6 F (36.4 C) 98 F (36.7 C)   TempSrc: Oral Oral Axillary   Resp: 20 18 18    Height:      Weight:   200 lb 2.8 oz (90.8 kg)   SpO2: 94% 94% 98%    Weight change: -4 lb 3 oz (-1.9 kg)  Intake/Output Summary (Last 24 hours) at 02/19/14 1359 Last data filed at 02/19/14 0600  Gross per 24 hour  Intake    680 ml  Output      0 ml  Net    680 ml   Physical Exam  Constitutional: She is oriented to person, place, and time. She appears well-developed and well-nourished. No distress.  Abdominal: There is tenderness.  Mild suprapubic tederness  Musculoskeletal: She exhibits tenderness. She exhibits no edema.  Tender anterior chest wall.  Tender anterior shoulder (left).Full strength on flexion. Pain on extension. Poor strength on supination of left forearm.  Neurological: She is alert and oriented to person, place, and time. No cranial nerve deficit. Coordination normal.  Skin: She is not diaphoretic.  Psychiatric: She has a normal mood and affect. Her behavior is normal.     Lab Results: Basic Metabolic Panel:  Recent Labs Lab 02/17/14 1110 02/18/14 0740  NA 141 142  K 4.7 4.6  CL 104 104  CO2 23 27  GLUCOSE 99 80  BUN 19 23  CREATININE 1.13* 1.17*  CALCIUM 9.1 9.4   Liver Function Tests:  Recent Labs Lab 02/18/14 0740  AST 19  ALT 14  ALKPHOS 80  BILITOT 0.3  PROT 7.1  ALBUMIN 2.9*   CBC:  Recent Labs Lab 02/17/14 1125 02/18/14 0740  WBC 12.1* 7.4  HGB 12.6 12.0  HCT 39.4 38.4  MCV 88.9 90.8  PLT 251 246   Cardiac Enzymes:  Recent Labs Lab 02/17/14 1955 02/18/14 0100 02/18/14 0740  TROPONINI <0.30 <0.30 <0.30   BNP:  Recent Labs Lab 02/17/14 1110  PROBNP  833.0*   Thyroid Function Tests:  Recent Labs Lab 02/17/14 1955  TSH 0.096*   Urinalysis:  Recent Labs Lab 02/18/14 0358  COLORURINE YELLOW  LABSPEC 1.020  PHURINE 6.0  GLUCOSEU NEGATIVE  HGBUR MODERATE*  BILIRUBINUR NEGATIVE  KETONESUR NEGATIVE  PROTEINUR NEGATIVE  UROBILINOGEN 0.2  NITRITE POSITIVE*  LEUKOCYTESUR LARGE*     Micro Results: Recent Results (from the past 240 hour(s))  MRSA PCR SCREENING     Status: Abnormal   Collection Time    02/17/14  8:54 PM      Result Value Ref Range Status   MRSA by PCR POSITIVE (*) NEGATIVE Final   Comment:            The GeneXpert MRSA Assay (FDA     approved for NASAL specimens     only), is one component of a     comprehensive MRSA colonization     surveillance program. It is not     intended to diagnose MRSA     infection nor to guide or     monitor treatment for     MRSA infections.     RESULT CALLED TO, READ BACK BY AND VERIFIED WITH:     BUENDIA,A RN  [id number] AT 2253 SKEEN,P  URINE CULTURE     Status: None   Collection Time    02/18/14  3:58 AM      Result Value Ref Range Status   Specimen Description URINE, CLEAN CATCH   Final   Special Requests NONE   Final   Culture  Setup Time     Final   Value: 02/18/2014 04:06     Performed at [facility] PENDING   Incomplete   Culture     Final   Value: Culture reincubated for better growth     Performed at [facility]   Report Status PENDING   Incomplete   Studies/Results: Dg Shoulder Left  02/18/2014   CLINICAL DATA:  Left shoulder pain after fall.  EXAM: LEFT SHOULDER - 2+ VIEW  COMPARISON:  None.  FINDINGS: There is no evidence of fracture or dislocation. Visualized ribs appear normal. There is no evidence of arthropathy or other focal bone abnormality. Soft tissues are unremarkable.  IMPRESSION: Normal left shoulder.   Electronically Signed   By: Sabino Dick M.D.   On: 02/18/2014 12:56   Medications: I have reviewed the  patient's current medications. Scheduled Meds: . Chlorhexidine Gluconate Cloth  6 each Topical Q0600  . ciprofloxacin  200 mg Intravenous Q12H  . dipyridamole-aspirin  1 capsule Oral BID  . donepezil  10 mg Oral QHS  . heparin  5,000 Units Subcutaneous 3 times per day  . levothyroxine  150 mcg Oral QAC breakfast  . mupirocin ointment  1 application Nasal BID  . ondansetron  4 mg Intravenous Once  . predniSONE  10 mg Oral QODAY  . predniSONE  15 mg Oral QODAY  . saccharomyces boulardii  250 mg Oral BID  . sertraline  50 mg Oral Daily  . sodium chloride  3 mL Intravenous Q12H   Continuous Infusions:  PRN Meds:.acetaminophen, acetaminophen, HYDROcodone-acetaminophen, nitroGLYCERIN, ondansetron (ZOFRAN) IV, ondansetron Assessment/Plan: Principal Problem:   Chest pain Active Problems:   Chronic UTI   Hypothyroidism   S/P rotator cuff surgery   Chronic diastolic heart failure   Steroid long-term use   CKD (chronic kidney disease) stage 3, GFR 30-69 ml/min  80 year old woman with a PMH of dHF (EF 65-70%), CKD3, HLD, depression, dementia, colon CA, temporal arteritis, HTN, and TIA who presents with chest pain.   MSK Chest Pain.: Stable - admit with telemetry monitoring   Shoulder pain: Hx of rotator cuff injury requiring surgery (right shoulder). Her current shoulder complaints are likely MSK in nature. Fracture appears unlikely, given negative xr.  UTI: Patient with incontinence at baseline. Wears adult diapers and has hx of UTIs. She reports burning at times and mild suprapubic tenderness. UA postive for hg, nitrite and leuks. Many bacteria on microscopy. - Started IV cipro, will transition to PO - f/u Urine culture sensitivities  CKD3: Baseline Cr 1.1-1.2. Stable.  - monitor BMP   dHF (EF 65-70%, Jan 2015): ECHO c/w diastolic dysfunction. Stable.  - daily weights   HTN: Home medications - amlodipine 5mg  and hydralazine 10mg  q8h. Stable.   Hypothyroidism: home medication -  synthroid 175mcg daily.  - continue Synthroid   Hx of temporal arteritis: Apparently the patient has biopsy confimed TA (2000). Plan to continue home medicines alternates between 10mg  and 15mg  of Prednisone each day.  Dementia: Home medication - Aricept  - continue Aricept   Depression: Home medication - Zoloft  - continue Zoloft   Diet:  Regular  DVT: SCDs on aggrenox    Dispo: Disposition is deferred at this time, awaiting improvement of current medical problems.  Anticipated discharge in approximately 1-2 day(s).   The patient does have a current PCP (Sande Brothers, MD) and does not need an [facility] hospital follow-up appointment after discharge.  The patient does have transportation limitations that hinder transportation to clinic appointments.  .Services Needed at time of discharge: Y = Yes, Blank = No PT:   OT:   RN:   Equipment:   Other:     LOS: 2 days   Marrion Coy, MD 02/19/2014, 1:59 PM

## 2014-02-19 NOTE — Progress Notes (Signed)
Nutrition Brief Note  Patient identified due to Low [place]  Wt Readings from Last 15 Encounters:  02/19/14 200 lb 2.8 oz (90.8 kg)  11/01/13 209 lb 10.5 oz (95.1 kg)  07/20/13 205 lb 7.5 oz (93.2 kg)  05/31/13 203 lb 14.8 oz (92.5 kg)  04/19/13 204 lb (92.534 kg)  01/23/13 203 lb 12.8 oz (92.443 kg)  12/11/12 204 lb (92.534 kg)  12/06/12 204 lb 5.9 oz (92.7 kg)  10/12/12 196 lb 10.4 oz (89.2 kg)  09/07/12 188 lb 11.4 oz (85.6 kg)  06/25/12 197 lb (89.359 kg)  05/30/12 199 lb (90.266 kg)  05/24/12 204 lb 12.8 oz (92.897 kg)  05/17/12 190 lb 14.4 oz (86.592 kg)  03/08/12 202 lb 2.6 oz (91.7 kg)    Body mass index is 29.55 kg/(m^2). Patient meets criteria for Overweight based on current BMI.   Current diet order is Heart Healthy, patient is consuming approximately 100% of meals at this time. Pt reports having a good appetite and eating well. She denies any questions or concerns at this times. Labs and medications reviewed.   No nutrition interventions warranted at this time. If nutrition issues arise, please consult RD.   Pryor Ochoa RD, LDN Inpatient Clinical Dietitian Pager: [phone] After Hours Pager: [phone]

## 2014-02-19 NOTE — Progress Notes (Signed)
CSW received order for d/c today per patient's nurse. Physical Therapy assessed patient and recommends SNF level of care. CSW spoke to patient's daughter Langley Gauss- patient has recent prior experience in a SNF with what she felt was a poor outcome and does not wish her mother to return to this level of care. She feels that she receives excellent care at [facility] and that they are already providing "enhanced care" for her mother.  She has been very pleased with the care provided and states that her mother is "happy there".  CSW spoke with patient who confirmed the same- however, she defers all decisions to her daughter. CSW contacted [place] at [facility]- she feel that they can manage patient well at the facility and will accept back. Will need to review Fl2.  Awaiting completion of d/c summary with meds so that Fl2 can be completed and sent to facility.  Daughter will transport via car.  Lorie Phenix. [place], [place]

## 2014-02-19 NOTE — Clinical Social Work Psychosocial (Signed)
Clinical Social Work Department BRIEF PSYCHOSOCIAL ASSESSMENT 02/18/2014  Patient:  [name], [name]     Account Number:  [id number]     Admit date:  02/17/2014  Clinical Social Worker:  Iona Coach  Date/Time:  02/18/2014 06:30 PM  Referred by:  Physician  Date Referred:  02/18/2014 Referred for  Other - See comment   Other Referral:   From ALF- P.T. recommends increased level of care to SNF   Interview type:  Other - See comment Other interview type:   Patient. Spoke to daughter [name] by telephone    PSYCHOSOCIAL DATA Living Status:  FACILITY Admitted from facility:  Other Level of care:  Assisted Living Primary support name:  [name]  [phone]  (c) [phone] Primary support relationship to patient:  CHILD, ADULT Degree of support available:   Very Supportive    CURRENT CONCERNS Current Concerns  Other - See comment   Other Concerns:   SNF vs return to [place] / PLAN 80 year old female- resident of [facility]. This CSW is familiar with patient from recent prior hospitalization where patient received recommendation for SNF for rehab and was placed at [facility]. CSW met with patient and she stated that she did receive therapy at the SNF but improved enough to return back to her ALF. She states that she is happier there than at the SNF. CSW related to patient that P.T. is recommended return to SNF; patient stated that she would prefer to return to [facility] but would defer all d/c decisions to her daughter [name]. CSW spoke with daughter who stated that she did not want her mother to go to a SNF as she was not pleased with the level of care at the facility. She feels that her mother gets closer observation and care at the ALF where her mother is happy and likes the staff.  CSW offered to seek another facility rather than return to prior SNF but daughter declined.  CSW will contact Admissions staff at [facility] in the  morning to make sure that they can meet patient's needs for care and physical therapy.  Fl2 completed and placed on chart for MD's signature.   Assessment/plan status:  Psychosocial Support/Ongoing Assessment of Needs Other assessment/ plan:   Information/referral to community resources:   SNF bed list offered and declined    PATIENT'S/FAMILY'S RESPONSE TO PLAN OF CARE: Patient is alert and oriented to person and place; she stated that she was very happy at [facility] and prefers to return there. Patient was noted to be more lethargic this admission and she stated that she has not been feeling well.  Patient defers to her daughter in all decision making and states that she looks after all of her needs.  She will agree to whatever her daughter wants. Daughter [name] is very supportive and invovled in her mother's care. She feels that he mother should return to the ALF- she already has 02 in place and faciility staff are friendly and supportive.

## 2014-02-19 NOTE — Discharge Instructions (Addendum)
Chest Wall Pain °Chest wall pain is pain in or around the bones and muscles of your chest. It may take up to 6 weeks to get better. It may take longer if you must stay physically active in your work and activities.  °CAUSES  °Chest wall pain may happen on its own. However, it may be caused by: °· A viral illness like the flu. °· Injury. °· Coughing. °· Exercise. °· Arthritis. °· Fibromyalgia. °· Shingles. °HOME CARE INSTRUCTIONS  °· Avoid overtiring physical activity. Try not to strain or perform activities that cause pain. This includes any activities using your chest or your abdominal and side muscles, especially if heavy weights are used. °· Put ice on the sore area. °· Put ice in a plastic bag. °· Place a towel between your skin and the bag. °· Leave the ice on for 15-20 minutes per hour while awake for the first 2 days. °· Only take over-the-counter or prescription medicines for pain, discomfort, or fever as directed by your caregiver. °SEEK IMMEDIATE MEDICAL CARE IF:  °· Your pain increases, or you are very uncomfortable. °· You have a fever. °· Your chest pain becomes worse. °· You have new, unexplained symptoms. °· You have nausea or vomiting. °· You feel sweaty or lightheaded. °· You have a cough with phlegm (sputum), or you cough up blood. °MAKE SURE YOU:  °· Understand these instructions. °· Will watch your condition. °· Will get help right away if you are not doing well or get worse. °Document Released: 09/12/2005 Document Revised: 12/05/2011 Document Reviewed: 05/09/2011 °ExitCare® Patient Information ©2014 [employer]. °Urinary Tract Infection °Urinary tract infections (UTIs) can develop anywhere along your urinary tract. Your urinary tract is your body's drainage system for removing wastes and extra water. Your urinary tract includes two kidneys, two ureters, a bladder, and a urethra. Your kidneys are a pair of bean-shaped organs. Each kidney is about the size of your fist. They are located below  your ribs, one on each side of your spine. °CAUSES °Infections are caused by microbes, which are microscopic organisms, including fungi, viruses, and bacteria. These organisms are so small that they can only be seen through a microscope. Bacteria are the microbes that most commonly cause UTIs. °SYMPTOMS  °Symptoms of UTIs may vary by age and gender of the patient and by the location of the infection. Symptoms in young women typically include a frequent and intense urge to urinate and a painful, burning feeling in the bladder or urethra during urination. Older women and men are more likely to be tired, shaky, and weak and have muscle aches and abdominal pain. A fever may mean the infection is in your kidneys. Other symptoms of a kidney infection include pain in your back or sides below the ribs, nausea, and vomiting. °DIAGNOSIS °To diagnose a UTI, your caregiver will ask you about your symptoms. Your caregiver also will ask to provide a urine sample. The urine sample will be tested for bacteria and white blood cells. White blood cells are made by your body to help fight infection. °TREATMENT  °Typically, UTIs can be treated with medication. Because most UTIs are caused by a bacterial infection, they usually can be treated with the use of antibiotics. The choice of antibiotic and length of treatment depend on your symptoms and the type of bacteria causing your infection. °HOME CARE INSTRUCTIONS °· If you were prescribed antibiotics, take them exactly as your caregiver instructs you. Finish the medication even if you feel better   after you have only taken some of the medication. °· Drink enough water and fluids to keep your urine clear or pale yellow. °· Avoid caffeine, tea, and carbonated beverages. They tend to irritate your bladder. °· Empty your bladder often. Avoid holding urine for long periods of time. °· Empty your bladder before and after sexual intercourse. °· After a bowel movement, women should cleanse from  front to back. Use each tissue only once. °SEEK MEDICAL CARE IF:  °· You have back pain. °· You develop a fever. °· Your symptoms do not begin to resolve within 3 days. °SEEK IMMEDIATE MEDICAL CARE IF:  °· You have severe back pain or lower abdominal pain. °· You develop chills. °· You have nausea or vomiting. °· You have continued burning or discomfort with urination. °MAKE SURE YOU:  °· Understand these instructions. °· Will watch your condition. °· Will get help right away if you are not doing well or get worse. °Document Released: 06/22/2005 Document Revised: 03/13/2012 Document Reviewed: 10/21/2011 °ExitCare® Patient Information ©2014 [employer]. ° °

## 2014-02-19 NOTE — Progress Notes (Signed)
Pt was discharged assisted with the EMS transport personnel per stretcher. No complains of pain, not in respiratory distress.

## 2014-02-19 NOTE — Discharge Summary (Signed)
Name: [name] MRN: [id number] DOB: 26-Mar-1930 80 y.o. PCP: Karen Brothers, MD  Date of Admission: 02/17/2014  9:51 AM Date of Discharge: 02/19/2014 Attending Physician: Karen Belt, MD  Discharge Diagnosis:  Principal Problem:   Chest pain Active Problems:   Chronic UTI   Hypothyroidism   S/P rotator cuff surgery   Chronic diastolic heart failure   Steroid long-term use   CKD (chronic kidney disease) stage 3, GFR 30-59 ml/min  Discharge Medications:   Medication List         alum & mag hydroxide-simeth 200-200-20 MG/5ML suspension  Commonly known as:  MAALOX/MYLANTA  Take 30 mLs by mouth every 6 (six) hours as needed for indigestion or heartburn.     amLODipine 5 MG tablet  Commonly known as:  NORVASC  Take 5 mg by mouth every morning.     BETA CAROTENE PO  Take 1 capsule by mouth daily.     ciprofloxacin 250 MG tablet  Commonly known as:  CIPRO  Take 1 tablet (250 mg total) by mouth 2 (two) times daily.     Cranberry 250 MG Caps  Take 1 capsule by mouth 2 (two) times daily.     dipyridamole-aspirin 200-25 MG per 12 hr capsule  Commonly known as:  AGGRENOX  Take 1 capsule by mouth 2 (two) times daily.     donepezil 10 MG tablet  Commonly known as:  ARICEPT  Take 10 mg by mouth at bedtime.     ferrous sulfate 325 (65 FE) MG tablet  Take 325 mg by mouth daily with breakfast.     hydrALAZINE 10 MG tablet  Commonly known as:  APRESOLINE  Take 1 tablet (10 mg total) by mouth every 8 (eight) hours.     levothyroxine 150 MCG tablet  Commonly known as:  SYNTHROID, LEVOTHROID  Take 150 mcg by mouth daily before breakfast.     loperamide 2 MG capsule  Commonly known as:  IMODIUM  Take 2 mg by mouth as needed for diarrhea or loose stools.     omeprazole 20 MG capsule  Commonly known as:  PRILOSEC  Take 20 mg by mouth 2 (two) times daily before a meal.     ondansetron 4 MG tablet  Commonly known as:  ZOFRAN  Take 1 tablet (4 mg total) by  mouth every 6 (six) hours as needed for nausea.     potassium chloride SA 20 MEQ tablet  Commonly known as:  K-DUR,KLOR-CON  Take 20 mEq by mouth 2 (two) times daily.     predniSONE 10 MG tablet  Commonly known as:  DELTASONE  Take 10 mg by mouth every other day. Alternating with 15mg  dose     saccharomyces boulardii 250 MG capsule  Commonly known as:  FLORASTOR  Take 250 mg by mouth 2 (two) times daily.     sertraline 50 MG tablet  Commonly known as:  ZOLOFT  Take 50 mg by mouth every morning.     TYLENOL 325 MG tablet  Generic drug:  acetaminophen  Take 650 mg by mouth every 6 (six) hours as needed for mild pain.        Disposition and follow-up:   [name] was discharged from [facility] in Stable condition.  At the hospital follow up visit please address:  1.  Chest Wall Pain, Bil Shoulder Pain (may need outpatient referral to sports medicine versus ortho), UTI  2.  Labs / imaging needed  at time of follow-up: BMP, CBC  3.  Pending labs/ test needing follow-up: Urine Culture  Follow-up Appointments:   Discharge Instructions:     Discharge Instructions   Call MD for:    Complete by:  As directed   New or worsening symptoms     Diet - low sodium heart healthy    Complete by:  As directed      Discharge instructions    Complete by:  As directed   Please take your medication as prescribed.   Please follow up with the appointments that we have made for you.     Increase activity slowly    Complete by:  As directed            Consultations:    Procedures Performed:  Dg Chest 2 View  02/17/2014   CLINICAL DATA:  Chest pain, shortness of breath.  EXAM: CHEST  2 VIEW  COMPARISON:  October 28, 2013.  FINDINGS: Stable cardiomegaly. No pneumothorax is noted. Right lung is clear. Blunting of left costophrenic sulcus is noted which is unchanged compared to prior exam and most consistent with scarring. No definite pleural effusion is  noted. No acute pulmonary disease is noted. Bony thorax is intact.  IMPRESSION: No acute cardiopulmonary abnormality seen.   Electronically Signed   By: [name] M.D.   On: 02/17/2014 12:35   Dg Cervical Spine Complete  02/17/2014   CLINICAL DATA:  Neck pain.  EXAM: CERVICAL SPINE  4+ VIEWS  COMPARISON:  Scout image for head CT dated 03/08/2012  FINDINGS: There is a 2 mm anterolisthesis of C3 on C4 which was not visible on the prior study. Patient does have severe bilateral facet arthritis at C2-3, C3-4 and C4-5. The lower cervical spine is not well seen on the lateral view. No prevertebral soft tissue swelling.  IMPRESSION: No acute osseous abnormality. New slight anterolisthesis of C3 on C4 which is felt to be due to degenerative facet arthritis.   Electronically Signed   By: [name] M.D.   On: 02/17/2014 12:50   Dg Lumbar Spine Complete  02/17/2014   CLINICAL DATA:  Low back pain.  EXAM: LUMBAR SPINE - COMPLETE 4+ VIEW  COMPARISON:  CT scan dated 07/19/2013  FINDINGS: There is no fracture or bone destruction. There is chronic degenerative disc disease at L4-5 and L5-S1 with disc space narrowing and vacuum phenomena, unchanged. No subluxation. Slight degenerative facet arthritis at L4-5 and L5-S1.  IMPRESSION: No acute abnormalities. Degenerative disc and joint disease in the lower lumbar spine.   Electronically Signed   By: [name] M.D.   On: 02/17/2014 12:51   Dg Shoulder Left  02/18/2014   CLINICAL DATA:  Left shoulder pain after fall.  EXAM: LEFT SHOULDER - 2+ VIEW  COMPARISON:  None.  FINDINGS: There is no evidence of fracture or dislocation. Visualized ribs appear normal. There is no evidence of arthropathy or other focal bone abnormality. Soft tissues are unremarkable.  IMPRESSION: Normal left shoulder.   Electronically Signed   By: [name] M.D.   On: 02/18/2014 12:56    Admission HPI: [name] is a 80 year old woman with a PMH of dHF (EF 65-70%, Jan 2015), CKD3, HLD,  depression, dementia, colon CA, temporal arteritis, HTN, and TIA. She presents to [facility] via EMS with c/o chest pain, B/L shoulder/arm pain and B/L neck pain. History is reported by patients daughter who was not present for the event, which occurred at the  patient's SNF. She reportedly slid out of a chair while be assisted and was caught before she hit the ground. Patient says she landed on her bottom. She immediately began to feel 5/10 sharp, non-pleuritic central chest pain and dyspnea. EMS was called. Her pain did not respond to ASA. NTG in the ED helped. She has not had chest pain before. She takes a PPI for GERD but denies reflux symptoms. She denies cough or cold symptoms recently. Her chest pain has resolved by the time of this providers exam.   In the ED: T 97.50F, RR 20, SpO2 90s-100 on 2L, HR 50-90s, BP 161/58mmHg; she received NTG and Toradol.   Hospital Course by problem list: Principal Problem:   Chest pain Active Problems:   Chronic UTI   Hypothyroidism   S/P rotator cuff surgery   Chronic diastolic heart failure   Steroid long-term use   CKD (chronic kidney disease) stage 3, GFR 30-59 ml/min   MSK Chest Pain.: The patient experience chest pain at rest, described as sharp, reproducible on exam. It sounds as if someone reached underneath and caught her as she was falling. Her chest is very tender. No fracture or acute abnormality on cervical/lumbar xrays. Troponins were engative. No change on EKG. NSAID would be optimal therapy, but patient with CKD. Patient did well with tylenol.  Shoulder pain: Hx of rotator cuff injury requiring surgery (right shoulder). Her current shoulder complaints are likely MSK in nature. Fracture appears unlikely, shoulder xray negative. PT, and outpatient management with ortho or sports medicine if it fails to resolve with conservative treatment.  UTI: Patient with incontinence at baseline. Wears adult diapers and has hx of UTIs. She reports burning at times  and mild suprapubic tenderness. UA postive for hg, nitrite and leuks. Many bacteria on microscopy. Urine culture was recultured due to insufficient growth. Patient was deescalated to PO cipro on discharge.  CKD3: Baseline Cr 1.1-1.2. Stable on admission.  dHF (EF 65-70%, Jan 2015): ECHO c/w diastolic dysfunction. Stable.   HTN: Home medications - amlodipine 5mg  and hydralazine 10mg  q8h. Stable on admission.   Hypothyroidism: Stable on admission. Continued home medication - synthroid 138mcg daily.  Hx of temporal arteritis: Apparently the patient has biopsy confimed TA (2000). Plan to continue home medicines alternates between 10mg  and 15mg  of Prednisone each day. Continued on admission.   Discharge Vitals:   BP 139/52  Pulse 89  Temp(Src) 98 F (36.7 C) (Axillary)  Resp 18  Ht 5\' 9"  (1.753 m)  Wt 200 lb 2.8 oz (90.8 kg)  BMI 29.55 kg/m2  SpO2 98%  Discharge Labs:  No results found for this or any previous visit (from the past 24 hour(s)).  Signed: Marrion Coy, MD 02/19/2014, 12:57 PM   Time Spent on Discharge: 35 minutes Services Ordered on Discharge: None Equipment Ordered on Discharge: None

## 2014-02-19 NOTE — Evaluation (Signed)
Physical Therapy Evaluation Patient Details Name: [name] MRN: [id number] DOB: 1930-04-03 Today's Date: 02/19/2014   History of Present Illness   80 year old woman with a PMH of dHF (EF 65-70%, Jan 2015), CKD3, HLD, depression, dementia, colon CA, temporal arteritis, HTN, and TIA.  She presents to [facility] via EMS with c/o chest pain, B/L shoulder/arm pain and B/L neck pain.    Clinical Impression  Pt adm due to the above. Pt presents with decreased independence with functional mobility secondary to deficits indicated below. Pt to benefit from skilled acute PT to address deficits and maximize mobility. Pt reports her goal is to become ambulatory again, will benefit from continued skilled PT when she returns to SNF.     Follow Up Recommendations SNF;Supervision/Assistance - 24 hour    Equipment Recommendations  None recommended by PT    Recommendations for Other Services       Precautions / Restrictions Precautions Precautions: Fall Restrictions Weight Bearing Restrictions: No      Mobility  Bed Mobility Overal bed mobility: Needs Assistance Bed Mobility: Supine to Sit;Sit to Supine     Supine to sit: Mod assist;HOB elevated Sit to supine: Mod assist   General bed mobility comments: pt requires incr time due to generalized weakness; cues for hand placement and sequenicng (A) to elevate trunk and to advance LEs off/onto bed  Transfers Overall transfer level: Needs assistance Equipment used: Rolling walker (2 wheeled) Transfers: Sit to/from Stand Sit to Stand: Max assist         General transfer comment: max (A) with use of gt belt to (A) pt and achieve upright standing position; cues for hand placement and sequencing; pt unsteady and trembling due to generalized LE weakness   Ambulation/Gait                Stairs            Wheelchair Mobility    Modified Rankin (Stroke Patients Only)       Balance Overall balance assessment: History of  Falls;Needs assistance Sitting-balance support: Feet supported;Single extremity supported Sitting balance-Leahy Scale: Poor Sitting balance - Comments: requires UE (A); initaly c/o dizziness; dizziness subsided in ~5 min   Postural control: Posterior lean Standing balance support: During functional activity;Bilateral upper extremity supported Standing balance-Leahy Scale: Zero Standing balance comment: requires max (A) and RW for UE support; tolerated standing ~15 seconds and required return to supine                              Pertinent Vitals/Pain C/o intermittent chest pain; RN made aware    Home Living Family/patient expects to be discharged to:: Skilled nursing facility                 Additional Comments: will need SNF    Prior Function Level of Independence: Needs assistance   Gait / Transfers Assistance Needed: reports she is primarily in wheelchair; reports she stands with RW and(A) to transfer to/from wheelchair   ADL's / Homemaking Assistance Needed: assist for bathing        Hand Dominance   Dominant Hand: Right    Extremity/Trunk Assessment   Upper Extremity Assessment: Defer to OT evaluation           Lower Extremity Assessment: Generalized weakness      Cervical / Trunk Assessment: Kyphotic  Communication   Communication: No difficulties  Cognition Arousal/Alertness: Awake/alert Behavior During Therapy:  Flat affect Overall Cognitive Status: No family/caregiver present to determine baseline cognitive functioning Area of Impairment: Orientation;Memory;Following commands;Safety/judgement;Problem solving Orientation Level: Disoriented to;Time;Situation   Memory: Decreased short-term memory Following Commands: Follows one step commands with increased time     Problem Solving: Slow processing;Decreased initiation;Requires verbal cues;Requires tactile cues      General Comments      Exercises General Exercises - Lower  Extremity Ankle Circles/Pumps: AROM;Both;10 reps Long Arc Quad: AROM;Strengthening;Both;10 reps;Seated Hip Flexion/Marching: AROM;Strengthening;Both;10 reps;Seated      Assessment/Plan    PT Assessment Patient needs continued PT services  PT Diagnosis Difficulty walking;Generalized weakness;Acute pain   PT Problem List Decreased strength;Decreased activity tolerance;Decreased balance;Decreased mobility;Decreased cognition;Decreased knowledge of use of DME;Decreased safety awareness;Pain  PT Treatment Interventions DME instruction;Functional mobility training;Therapeutic exercise;Therapeutic activities;Balance training;Neuromuscular re-education;Patient/family education;Wheelchair mobility training   PT Goals (Current goals can be found in the Care Plan section) Acute Rehab PT Goals Patient Stated Goal: to walk again  PT Goal Formulation: With patient Time For Goal Achievement: 02/26/14 Potential to Achieve Goals: Fair    Frequency Min 2X/week   Barriers to discharge        Co-evaluation               End of Session Equipment Utilized During Treatment: Gait belt;Oxygen Activity Tolerance: Patient limited by fatigue Patient left: in bed;with call bell/phone within reach;with bed alarm set Nurse Communication: Mobility status    Functional Assessment Tool Used: clinical judement  Functional Limitation: Mobility: Walking and moving around Mobility: Walking and Moving Around Current Status ([id number]): At least 60 percent but less than 80 percent impaired, limited or restricted Mobility: Walking and Moving Around Goal Status [phone]): At least 20 percent but less than 40 percent impaired, limited or restricted    Time: 0846-0909 PT Time Calculation (min): 23 min   Charges:   PT Evaluation $Initial PT Evaluation Tier I: 1 Procedure PT Treatments $Therapeutic Activity: 8-22 mins   PT G Codes:   Functional Assessment Tool Used: clinical judement  Functional Limitation:  Mobility: Walking and moving around    [place], [place]  [phone] 02/19/2014, 9:32 AM

## 2014-02-19 NOTE — Discharge Summary (Signed)
Attending physician note: I attest to the accuracy of the above report by resident physician Dr. Marrion Coy.

## 2014-02-20 ENCOUNTER — Encounter: Payer: Self-pay | Admitting: Internal Medicine

## 2014-02-22 LAB — URINE CULTURE

## 2014-02-26 ENCOUNTER — Ambulatory Visit: Payer: Medicare Other | Admitting: Internal Medicine

## 2014-03-20 ENCOUNTER — Encounter: Payer: Self-pay | Admitting: Neurology

## 2014-04-21 ENCOUNTER — Ambulatory Visit: Payer: Medicare Other | Admitting: Neurology

## 2014-05-09 ENCOUNTER — Emergency Department (HOSPITAL_COMMUNITY)
Admission: EM | Admit: 2014-05-09 | Discharge: 2014-05-09 | Disposition: A | Discharge status: Home / Self Care | Payer: Medicare Other | Attending: Emergency Medicine | Admitting: Emergency Medicine

## 2014-05-09 ENCOUNTER — Emergency Department (HOSPITAL_COMMUNITY): Payer: Medicare Other

## 2014-05-09 ENCOUNTER — Encounter (HOSPITAL_COMMUNITY): Payer: Self-pay | Admitting: Emergency Medicine

## 2014-05-09 DIAGNOSIS — R5381 Other malaise: Secondary | ICD-10-CM | POA: Insufficient documentation

## 2014-05-09 DIAGNOSIS — E785 Hyperlipidemia, unspecified: Secondary | ICD-10-CM | POA: Diagnosis not present

## 2014-05-09 DIAGNOSIS — Z8673 Personal history of transient ischemic attack (TIA), and cerebral infarction without residual deficits: Secondary | ICD-10-CM | POA: Diagnosis not present

## 2014-05-09 DIAGNOSIS — Z8669 Personal history of other diseases of the nervous system and sense organs: Secondary | ICD-10-CM | POA: Diagnosis not present

## 2014-05-09 DIAGNOSIS — R197 Diarrhea, unspecified: Secondary | ICD-10-CM | POA: Diagnosis not present

## 2014-05-09 DIAGNOSIS — Z8639 Personal history of other endocrine, nutritional and metabolic disease: Secondary | ICD-10-CM | POA: Insufficient documentation

## 2014-05-09 DIAGNOSIS — F0390 Unspecified dementia without behavioral disturbance: Secondary | ICD-10-CM | POA: Insufficient documentation

## 2014-05-09 DIAGNOSIS — I428 Other cardiomyopathies: Secondary | ICD-10-CM | POA: Diagnosis not present

## 2014-05-09 DIAGNOSIS — Z87891 Personal history of nicotine dependence: Secondary | ICD-10-CM | POA: Diagnosis not present

## 2014-05-09 DIAGNOSIS — Z9981 Dependence on supplemental oxygen: Secondary | ICD-10-CM | POA: Insufficient documentation

## 2014-05-09 DIAGNOSIS — D649 Anemia, unspecified: Secondary | ICD-10-CM | POA: Insufficient documentation

## 2014-05-09 DIAGNOSIS — I5030 Unspecified diastolic (congestive) heart failure: Secondary | ICD-10-CM | POA: Insufficient documentation

## 2014-05-09 DIAGNOSIS — Z862 Personal history of diseases of the blood and blood-forming organs and certain disorders involving the immune mechanism: Secondary | ICD-10-CM | POA: Diagnosis not present

## 2014-05-09 DIAGNOSIS — IMO0002 Reserved for concepts with insufficient information to code with codable children: Secondary | ICD-10-CM | POA: Diagnosis not present

## 2014-05-09 DIAGNOSIS — Z8679 Personal history of other diseases of the circulatory system: Secondary | ICD-10-CM | POA: Diagnosis not present

## 2014-05-09 DIAGNOSIS — R5383 Other fatigue: Secondary | ICD-10-CM

## 2014-05-09 DIAGNOSIS — I119 Hypertensive heart disease without heart failure: Secondary | ICD-10-CM | POA: Insufficient documentation

## 2014-05-09 DIAGNOSIS — Z79899 Other long term (current) drug therapy: Secondary | ICD-10-CM | POA: Diagnosis not present

## 2014-05-09 LAB — URINALYSIS, ROUTINE W REFLEX MICROSCOPIC
Bilirubin Urine: NEGATIVE
GLUCOSE, UA: NEGATIVE mg/dL
KETONES UR: NEGATIVE mg/dL
Leukocytes, UA: NEGATIVE
Nitrite: NEGATIVE
Protein, ur: NEGATIVE mg/dL
Specific Gravity, Urine: 1.012 (ref 1.005–1.030)
Urobilinogen, UA: 0.2 mg/dL (ref 0.0–1.0)
pH: 6 (ref 5.0–8.0)

## 2014-05-09 LAB — COMPREHENSIVE METABOLIC PANEL
ALT: 20 U/L (ref 0–35)
AST: 41 U/L — AB (ref 0–37)
Albumin: 3 g/dL — ABNORMAL LOW (ref 3.5–5.2)
Alkaline Phosphatase: 88 U/L (ref 39–117)
Anion gap: 14 (ref 5–15)
BUN: 15 mg/dL (ref 6–23)
CHLORIDE: 96 meq/L (ref 96–112)
CO2: 24 meq/L (ref 19–32)
Calcium: 9.3 mg/dL (ref 8.4–10.5)
Creatinine, Ser: 1.16 mg/dL — ABNORMAL HIGH (ref 0.50–1.10)
GFR calc Af Amer: 49 mL/min — ABNORMAL LOW (ref >90)
GFR, EST NON AFRICAN AMERICAN: 43 mL/min — AB (ref >90)
GLUCOSE: 147 mg/dL — AB (ref 70–99)
Potassium: 5.4 mEq/L — ABNORMAL HIGH (ref 3.7–5.3)
Sodium: 134 mEq/L — ABNORMAL LOW (ref 137–147)
Total Bilirubin: 0.5 mg/dL (ref 0.3–1.2)
Total Protein: 7.6 g/dL (ref 6.0–8.3)

## 2014-05-09 LAB — CBC WITH DIFFERENTIAL/PLATELET
Basophils Absolute: 0 10*3/uL (ref 0.0–0.1)
Basophils Relative: 0 % (ref 0–1)
EOS ABS: 0 10*3/uL (ref 0.0–0.7)
Eosinophils Relative: 0 % (ref 0–5)
HCT: 41.6 % (ref 36.0–46.0)
HEMOGLOBIN: 13.3 g/dL (ref 12.0–15.0)
Lymphocytes Relative: 9 % — ABNORMAL LOW (ref 12–46)
Lymphs Abs: 1 10*3/uL (ref 0.7–4.0)
MCH: 29 pg (ref 26.0–34.0)
MCHC: 32 g/dL (ref 30.0–36.0)
MCV: 90.8 fL (ref 78.0–100.0)
MONO ABS: 0.3 10*3/uL (ref 0.1–1.0)
MONOS PCT: 2 % — AB (ref 3–12)
Neutro Abs: 9.8 10*3/uL — ABNORMAL HIGH (ref 1.7–7.7)
Neutrophils Relative %: 89 % — ABNORMAL HIGH (ref 43–77)
Platelets: 242 10*3/uL (ref 150–400)
RBC: 4.58 MIL/uL (ref 3.87–5.11)
RDW: 16.3 % — ABNORMAL HIGH (ref 11.5–15.5)
WBC: 11.1 10*3/uL — ABNORMAL HIGH (ref 4.0–10.5)

## 2014-05-09 LAB — URINE MICROSCOPIC-ADD ON

## 2014-05-09 LAB — LIPASE, BLOOD: LIPASE: 26 U/L (ref 11–59)

## 2014-05-09 MED ORDER — LOPERAMIDE HCL 2 MG PO CAPS: 2.0000 mg | ORAL_CAPSULE | Freq: Four times a day (QID) | ORAL | Status: DC | PRN

## 2014-05-09 MED ORDER — SODIUM CHLORIDE 0.9 % IV SOLN
1000.0000 mL | INTRAVENOUS | Status: DC
  Administered 2014-05-09: 1000 mL via INTRAVENOUS

## 2014-05-09 NOTE — ED Notes (Addendum)
Pt is on continous O2 of 2L via [place]. Pt also not usually ambulatory, but can assist staff with transitioning from bed to wheelchair.

## 2014-05-09 NOTE — ED Notes (Signed)
Patient is aware that a urine sample is needed, patient will provide one when able.

## 2014-05-09 NOTE — ED Notes (Signed)
Went to ask pt about urine pt not in room

## 2014-05-09 NOTE — ED Notes (Signed)
Bed: VO35 Expected date:  Expected time:  Means of arrival:  Comments: EMS-lethargic

## 2014-05-09 NOTE — ED Provider Notes (Signed)
CSN: [id number]     Arrival date & time 05/09/14  72 History   First MD Initiated Contact with Patient 05/09/14 1512     Chief Complaint  Patient presents with  . lethargic   . Diarrhea    HPI Pt confirms that she came to the ED to be evaluated for diarrhea.  She is not sure when it started though or any other details.  She states "you would have to ask them."  Per EMS report she was sent from [place] home.  Diarrhea started today.  She has also been lethargic.  She is not having any pain.  Denies fever or vomiting.  Denies chronic abdominal pain issues.   Past Medical History  Diagnosis Date  . Peripheral neuropathy   . Hypertension   . TIA (transient ischemic attack)   . Elevated sed rate   . Temporal arteritis     chronic steroids  . Hypothyroidism   . Colon cancer     colon  . History of hernia repair   . S/P rotator cuff surgery     right  . Diastolic CHF   . Cardiomegaly - hypertensive   . Chronic anemia   . Chronic steroid use   . Dementia   . Shortness of breath   . Hyperlipidemia   . CHF (congestive heart failure)   . Cardiomyopathy due to hypertension   . Dependence on supplemental oxygen   . Chronic UTI   . Dementia   . Giant cell arteritis    Past Surgical History  Procedure Laterality Date  . Tonsillectomy  1939  . Colectomy  1987  . Abdominal hysterectomy  1974  . Appendectomy  1947  . Hernia repair  1990  . Cholecystectomy  2000  . Rotator cuff repair  2004  . Right temporal artery biopsy  10/1998, 10/2009  . Right cataract extraction  2011  . Eye surgery Bilateral   . Lumbar puncture  10/07/2004   Family History  Problem Relation Age of Onset  . Dementia Mother   . Heart attack Father    History  Substance Use Topics  . Smoking status: Former Research scientist (life sciences)  . Smokeless tobacco: Former Systems developer  . Alcohol Use: No   OB History   Grav Para Term Preterm Abortions TAB SAB Ect Mult Living                 Review of Systems   Constitutional: Negative for fever.  HENT: Negative for sore throat.   Respiratory: Negative for shortness of breath.   Cardiovascular: Negative for chest pain.  Gastrointestinal: Positive for diarrhea. Negative for nausea, vomiting and abdominal pain.  Genitourinary: Negative for dysuria.  Neurological: Negative for headaches.      Allergies  Ceftriaxone; Ciprofloxacin; Codeine; Demerol; Metronidazole; Morphine and related; Nitrofurantoin monohyd macro; Pregabalin; Quinolones; and Septra  Home Medications   Prior to Admission medications   Medication Sig Start Date End Date Taking? Authorizing Provider  acetaminophen (TYLENOL) 500 MG tablet Take 500 mg by mouth every 4 (four) hours as needed for mild pain, fever or headache.   Yes Historical Provider, MD  alum & mag hydroxide-simeth (MAALOX/MYLANTA) 200-200-20 MG/5ML suspension Take 30 mLs by mouth every 6 (six) hours as needed for indigestion or heartburn.   Yes Historical Provider, MD  amLODipine (NORVASC) 5 MG tablet Take 5 mg by mouth every morning.   Yes Historical Provider, MD  BETA CAROTENE PO Take 1 capsule by mouth daily.  Yes Historical Provider, MD  cefdinir (OMNICEF) 300 MG capsule Take 300 mg by mouth every 12 (twelve) hours. For 10 days 05/01/14  Yes Historical Provider, MD  Cranberry (CRAN-MAX) 500 MG CAPS Take 1 capsule by mouth 2 (two) times daily.   Yes Historical Provider, MD  dipyridamole-aspirin (AGGRENOX) 200-25 MG per 12 hr capsule Take 1 capsule by mouth 2 (two) times daily.   Yes Historical Provider, MD  donepezil (ARICEPT) 10 MG tablet Take 10 mg by mouth at bedtime.   Yes Historical Provider, MD  ferrous sulfate 325 (65 FE) MG tablet Take 325 mg by mouth daily with breakfast.   Yes Historical Provider, MD  guaifenesin (Q-TUSSIN) 100 MG/5ML syrup Take 200 mg by mouth 3 (three) times daily as needed for cough.   Yes Historical Provider, MD  hydrALAZINE (APRESOLINE) 10 MG tablet Take 1 tablet (10 mg total) by  mouth every 8 (eight) hours. 12/09/12  Yes Nishant Dhungel, MD  levothyroxine (SYNTHROID, LEVOTHROID) 150 MCG tablet Take 150 mcg by mouth daily before breakfast.   Yes Historical Provider, MD  loperamide (IMODIUM) 2 MG capsule Take 2 mg by mouth as needed for diarrhea or loose stools.   Yes Historical Provider, MD  magnesium hydroxide (MILK OF MAGNESIA) 400 MG/5ML suspension Take 30 mLs by mouth daily as needed for mild constipation.   Yes Historical Provider, MD  Menthol-Zinc Oxide (RISAMINE) 0.44-20.625 % OINT Apply 1 application topically 3 (three) times daily as needed (for episodes of incontinence).   Yes Historical Provider, MD  Neomycin-Bacitracin-Polymyxin (TRIPLE ANTIBIOTIC) [phone] OINT Apply 1 application topically as needed (for wound care).   Yes Historical Provider, MD  ondansetron (ZOFRAN) 4 MG tablet Take 4 mg by mouth every 6 (six) hours as needed for nausea.   Yes Historical Provider, MD  ondansetron (ZOFRAN) 8 MG tablet Take 8 mg by mouth every 8 (eight) hours as needed for nausea.   Yes Historical Provider, MD  potassium chloride SA (K-DUR,KLOR-CON) 20 MEQ tablet Take 20 mEq by mouth 2 (two) times daily.   Yes Historical Provider, MD  predniSONE (DELTASONE) 10 MG tablet Take 10 mg by mouth every other day. Alternating day's with 15mg    Yes Historical Provider, MD  predniSONE (DELTASONE) 5 MG tablet Take 15 mg by mouth every other day. Alternating day's with 10mg    Yes Historical Provider, MD  ranitidine (ZANTAC) 150 MG tablet Take 150 mg by mouth 2 (two) times daily.   Yes Historical Provider, MD  saccharomyces boulardii (FLORASTOR) 250 MG capsule Take 250 mg by mouth 2 (two) times daily.   Yes Historical Provider, MD  sertraline (ZOLOFT) 50 MG tablet Take 50 mg by mouth every morning.    Yes Historical Provider, MD  loperamide (IMODIUM) 2 MG capsule Take 1 capsule (2 mg total) by mouth 4 (four) times daily as needed for diarrhea or loose stools. 05/09/14   Dorie Rank, MD   BP  148/81  Pulse 72  Temp(Src) 97.7 F (36.5 C) (Oral)  Resp 16  SpO2 94% Physical Exam  Nursing note and vitals reviewed. Constitutional: No distress.  HENT:  Head: Normocephalic and atraumatic.  Right Ear: External ear normal.  Left Ear: External ear normal.  Eyes: Conjunctivae are normal. Right eye exhibits no discharge. Left eye exhibits no discharge. No scleral icterus.  Neck: Neck supple. No tracheal deviation present.  Cardiovascular: Normal rate, regular rhythm and intact distal pulses.   Pulmonary/Chest: Effort normal and breath sounds normal. No stridor. No respiratory distress. She  has no wheezes. She has no rales.  Wearing nasal cannula oxygen   Abdominal: Soft. Bowel sounds are normal. She exhibits no distension. There is no tenderness. There is no rebound and no guarding.  Musculoskeletal: She exhibits no edema and no tenderness.  Neurological: She is alert. She has normal strength. No cranial nerve deficit (no facial droop, extraocular movements intact, no slurred speech) or sensory deficit. She exhibits normal muscle tone. She displays no seizure activity. Coordination normal.  General weakness  Skin: Skin is warm and dry. No rash noted. She is not diaphoretic.  Psychiatric: She has a normal mood and affect.    ED Course  Procedures (including critical care time) Labs Review Labs Reviewed  CBC WITH DIFFERENTIAL - Abnormal; Notable for the following:    WBC 11.1 (*)    RDW 16.3 (*)    Neutrophils Relative % 89 (*)    Neutro Abs 9.8 (*)    Lymphocytes Relative 9 (*)    Monocytes Relative 2 (*)    All other components within normal limits  COMPREHENSIVE METABOLIC PANEL - Abnormal; Notable for the following:    Sodium 134 (*)    Potassium 5.4 (*)    Glucose, Bld 147 (*)    Creatinine, Ser 1.16 (*)    Albumin 3.0 (*)    AST 41 (*)    GFR calc non Af Amer 43 (*)    GFR calc Af Amer 49 (*)    All other components within normal limits  URINALYSIS, ROUTINE W  REFLEX MICROSCOPIC - Abnormal; Notable for the following:    Hgb urine dipstick SMALL (*)    All other components within normal limits  URINE CULTURE  CLOSTRIDIUM DIFFICILE BY PCR  LIPASE, BLOOD  URINE MICROSCOPIC-ADD ON    Imaging Review Dg Chest 2 View  05/09/2014   CLINICAL DATA:  Weakness, cough, and congestion with history of CHF and hypertension  EXAM: CHEST  2 VIEW  COMPARISON:  PA and lateral chest of Feb 17, 2014  FINDINGS: The lungs are adequately inflated. There is no focal infiltrate. The pulmonary interstitial markings are less prominent than on the previous study. There is no pleural effusion. The cardiopericardial silhouette is mildly enlarged but stable. The pulmonary vascularity is not engorged. The mediastinum is normal in width. The bony thorax is unremarkable.  IMPRESSION: There is mild prominence of the pulmonary interstitial markings, but this is considerably improved since the previous study. The findings may reflect low-grade CHF without alveolar edema. There is no pneumonia.   Electronically Signed   By: David  Martinique   On: 05/09/2014 16:01    MDM   Final diagnoses:  Diarrhea    Pt has history of c diff but no recent abx.    Discussed findings with patient and daughter.  Pt is in no distress.  No fever.  No dehydration in the ED.  No vomiting. Will hold off on empiric abx.  Safe to dc back to nursing facility .   C diff pending.  Will rx imodium to take prn.    Dorie Rank, MD 05/09/14 [id number]

## 2014-05-09 NOTE — ED Notes (Signed)
Report given to Joneen Boers, Therapist, sports at [facility].

## 2014-05-09 NOTE — ED Notes (Signed)
[facility] here to pick up pt.. 

## 2014-05-09 NOTE — ED Notes (Signed)
Per EMS pt comes from [facility] assisted living for diarrhea for past couples of hours and lethargic.  Pt will respond to you.  Pt's had continuous diarrhea since she started about an hour ago per nursing home facility staff.

## 2014-05-09 NOTE — Discharge Instructions (Signed)

## 2014-05-09 NOTE — ED Notes (Signed)
[facility] called to take pt back to [facility]

## 2014-05-10 ENCOUNTER — Telehealth (HOSPITAL_BASED_OUTPATIENT_CLINIC_OR_DEPARTMENT_OTHER): Payer: Self-pay | Admitting: Emergency Medicine

## 2014-05-10 LAB — CLOSTRIDIUM DIFFICILE BY PCR: Toxigenic C. Difficile by PCR: POSITIVE — AB

## 2014-05-10 NOTE — Telephone Encounter (Signed)
Lab called + c diff. Chart sent to EDP for review. 

## 2014-05-11 ENCOUNTER — Telehealth (HOSPITAL_BASED_OUTPATIENT_CLINIC_OR_DEPARTMENT_OTHER): Payer: Self-pay | Admitting: Emergency Medicine

## 2014-05-11 NOTE — Telephone Encounter (Signed)
Post ED Visit - Positive Culture Follow-up: Successful Patient Follow-Up   Positive c-diff  [x]  Patient discharged without antimicrobial prescription and treatment is now indicated []  Organism is resistant to prescribed ED discharge antimicrobial []  Patient with positive blood cultures  Changes discussed with ED provider: Alvina Chou PA New antibiotic prescription Vancomycin 125 mg PO q 6 x 10 days, then             Vancomycin 125 mg PO q 12 hr x seven days, then            Vancomycin 125 mg PO daily x seven days Dispense #61, no refills    Contacted patient/[facility] , date 05/11/14, time 1525:    Pt resident @ [facility], notified pt's nurse Molli Barrows of + c diff result. Med order for Vancomycin and lab result faxed to [facility] [phone].   Ernesta Amble 05/11/2014, 3:32 PM

## 2014-05-12 LAB — URINE CULTURE

## 2014-05-27 ENCOUNTER — Emergency Department (HOSPITAL_COMMUNITY): Payer: Medicare Other

## 2014-05-27 ENCOUNTER — Encounter (HOSPITAL_COMMUNITY): Payer: Self-pay | Admitting: Emergency Medicine

## 2014-05-27 ENCOUNTER — Emergency Department (HOSPITAL_COMMUNITY)
Admission: EM | Admit: 2014-05-27 | Discharge: 2014-05-28 | Disposition: A | Discharge status: Home / Self Care | Payer: Medicare Other | Attending: Emergency Medicine | Admitting: Emergency Medicine

## 2014-05-27 DIAGNOSIS — F0390 Unspecified dementia without behavioral disturbance: Secondary | ICD-10-CM | POA: Insufficient documentation

## 2014-05-27 DIAGNOSIS — R112 Nausea with vomiting, unspecified: Secondary | ICD-10-CM

## 2014-05-27 DIAGNOSIS — Z8669 Personal history of other diseases of the nervous system and sense organs: Secondary | ICD-10-CM | POA: Diagnosis not present

## 2014-05-27 DIAGNOSIS — Z85038 Personal history of other malignant neoplasm of large intestine: Secondary | ICD-10-CM | POA: Diagnosis not present

## 2014-05-27 DIAGNOSIS — Z79899 Other long term (current) drug therapy: Secondary | ICD-10-CM | POA: Diagnosis not present

## 2014-05-27 DIAGNOSIS — Z8673 Personal history of transient ischemic attack (TIA), and cerebral infarction without residual deficits: Secondary | ICD-10-CM | POA: Insufficient documentation

## 2014-05-27 DIAGNOSIS — I119 Hypertensive heart disease without heart failure: Secondary | ICD-10-CM | POA: Insufficient documentation

## 2014-05-27 DIAGNOSIS — D649 Anemia, unspecified: Secondary | ICD-10-CM | POA: Diagnosis not present

## 2014-05-27 DIAGNOSIS — Z9981 Dependence on supplemental oxygen: Secondary | ICD-10-CM | POA: Diagnosis not present

## 2014-05-27 DIAGNOSIS — R509 Fever, unspecified: Secondary | ICD-10-CM | POA: Insufficient documentation

## 2014-05-27 DIAGNOSIS — Z87440 Personal history of urinary (tract) infections: Secondary | ICD-10-CM | POA: Insufficient documentation

## 2014-05-27 DIAGNOSIS — R609 Edema, unspecified: Secondary | ICD-10-CM | POA: Diagnosis not present

## 2014-05-27 DIAGNOSIS — Z9889 Other specified postprocedural states: Secondary | ICD-10-CM | POA: Insufficient documentation

## 2014-05-27 DIAGNOSIS — IMO0002 Reserved for concepts with insufficient information to code with codable children: Secondary | ICD-10-CM | POA: Insufficient documentation

## 2014-05-27 DIAGNOSIS — I428 Other cardiomyopathies: Secondary | ICD-10-CM | POA: Insufficient documentation

## 2014-05-27 DIAGNOSIS — E785 Hyperlipidemia, unspecified: Secondary | ICD-10-CM | POA: Insufficient documentation

## 2014-05-27 DIAGNOSIS — E039 Hypothyroidism, unspecified: Secondary | ICD-10-CM | POA: Insufficient documentation

## 2014-05-27 DIAGNOSIS — I5030 Unspecified diastolic (congestive) heart failure: Secondary | ICD-10-CM | POA: Insufficient documentation

## 2014-05-27 DIAGNOSIS — Z87891 Personal history of nicotine dependence: Secondary | ICD-10-CM | POA: Diagnosis not present

## 2014-05-27 LAB — CBC WITH DIFFERENTIAL/PLATELET
BASOS PCT: 0 % (ref 0–1)
Basophils Absolute: 0 10*3/uL (ref 0.0–0.1)
Eosinophils Absolute: 0 10*3/uL (ref 0.0–0.7)
Eosinophils Relative: 0 % (ref 0–5)
HEMATOCRIT: 40.8 % (ref 36.0–46.0)
Hemoglobin: 13.3 g/dL (ref 12.0–15.0)
LYMPHS PCT: 8 % — AB (ref 12–46)
Lymphs Abs: 0.8 10*3/uL (ref 0.7–4.0)
MCH: 29.8 pg (ref 26.0–34.0)
MCHC: 32.6 g/dL (ref 30.0–36.0)
MCV: 91.5 fL (ref 78.0–100.0)
MONO ABS: 0.3 10*3/uL (ref 0.1–1.0)
MONOS PCT: 3 % (ref 3–12)
NEUTROS ABS: 9.6 10*3/uL — AB (ref 1.7–7.7)
NEUTROS PCT: 89 % — AB (ref 43–77)
Platelets: 249 10*3/uL (ref 150–400)
RBC: 4.46 MIL/uL (ref 3.87–5.11)
RDW: 15.6 % — ABNORMAL HIGH (ref 11.5–15.5)
WBC: 10.7 10*3/uL — AB (ref 4.0–10.5)

## 2014-05-27 LAB — URINALYSIS, ROUTINE W REFLEX MICROSCOPIC
Bilirubin Urine: NEGATIVE
GLUCOSE, UA: NEGATIVE mg/dL
Hgb urine dipstick: NEGATIVE
KETONES UR: NEGATIVE mg/dL
Leukocytes, UA: NEGATIVE
Nitrite: NEGATIVE
Protein, ur: NEGATIVE mg/dL
SPECIFIC GRAVITY, URINE: 1.012 (ref 1.005–1.030)
Urobilinogen, UA: 1 mg/dL (ref 0.0–1.0)
pH: 7 (ref 5.0–8.0)

## 2014-05-27 LAB — COMPREHENSIVE METABOLIC PANEL
ALBUMIN: 3.1 g/dL — AB (ref 3.5–5.2)
ALK PHOS: 82 U/L (ref 39–117)
ALT: 13 U/L (ref 0–35)
AST: 16 U/L (ref 0–37)
Anion gap: 14 (ref 5–15)
BILIRUBIN TOTAL: 0.5 mg/dL (ref 0.3–1.2)
BUN: 16 mg/dL (ref 6–23)
CHLORIDE: 94 meq/L — AB (ref 96–112)
CO2: 25 meq/L (ref 19–32)
Calcium: 8.9 mg/dL (ref 8.4–10.5)
Creatinine, Ser: 1.09 mg/dL (ref 0.50–1.10)
GFR calc Af Amer: 53 mL/min — ABNORMAL LOW (ref >90)
GFR, EST NON AFRICAN AMERICAN: 46 mL/min — AB (ref >90)
Glucose, Bld: 125 mg/dL — ABNORMAL HIGH (ref 70–99)
POTASSIUM: 5.1 meq/L (ref 3.7–5.3)
Sodium: 133 mEq/L — ABNORMAL LOW (ref 137–147)
Total Protein: 7.5 g/dL (ref 6.0–8.3)

## 2014-05-27 LAB — I-STAT TROPONIN, ED: Troponin i, poc: 0.02 ng/mL (ref 0.00–0.08)

## 2014-05-27 LAB — LIPASE, BLOOD: Lipase: 19 U/L (ref 11–59)

## 2014-05-27 MED ORDER — ONDANSETRON HCL 4 MG/2ML IJ SOLN: 4.0000 mg | Freq: Once | INTRAMUSCULAR | Status: DC

## 2014-05-27 MED ORDER — PROMETHAZINE HCL 25 MG PO TABS: 25.0000 mg | ORAL_TABLET | Freq: Four times a day (QID) | ORAL | Status: AC | PRN

## 2014-05-27 MED ORDER — SODIUM CHLORIDE 0.9 % IV SOLN
1000.0000 mL | Freq: Once | INTRAVENOUS | Status: AC
  Administered 2014-05-27: 1000 mL via INTRAVENOUS

## 2014-05-27 NOTE — ED Provider Notes (Signed)
  Face-to-face evaluation   History: Intermittent vomiting for 3 days. No diarrhea. She is apparently not taking the anti-emetic. There is no known fever. Physical exam: Overweight female, in no apparent distress. Heart regular in rhythm. Lungs are clear. Abdomen normal sounds, soft, nontender to palpation  Medical screening examination/treatment/procedure(s) were conducted as a shared visit with non-physician practitioner(s) and myself.  I personally evaluated the patient during the encounter  Richarda Blade, MD 05/29/14 2005

## 2014-05-27 NOTE — ED Provider Notes (Signed)
CSN: [id number]     Arrival date & time 05/27/14  1621 History   First MD Initiated Contact with Patient 05/27/14 1703     Chief Complaint  Patient presents with  . Emesis  . Nausea  . Fever    HPI [name] is a 80 y.o. female with PMH of HTN, CHF, dementia, chronic UTI who presents from assisted living [facility] facility) due to "intractable vomiting." patients family member in the room. Remainder of history from family member. Emesis is food content, nonbloody nonbilious. She had vomiting two days ago and was asymtomatic yesterday and it started again this morning. She had carbs for breakfast but no lunch.  Patient without fevers in ED and family members reported no fevers, no abdominal pain but reports general weakness. No cough, CP, SOB. Patient denies urinary changes but has chronic UTI. Patient without cough, SOB.    Past Medical History  Diagnosis Date  . Peripheral neuropathy   . Hypertension   . TIA (transient ischemic attack)   . Elevated sed rate   . Temporal arteritis     chronic steroids  . Hypothyroidism   . Colon cancer     colon  . History of hernia repair   . S/P rotator cuff surgery     right  . Diastolic CHF   . Cardiomegaly - hypertensive   . Chronic anemia   . Chronic steroid use   . Dementia   . Shortness of breath   . Hyperlipidemia   . CHF (congestive heart failure)   . Cardiomyopathy due to hypertension   . Dependence on supplemental oxygen   . Chronic UTI   . Dementia   . Giant cell arteritis    Past Surgical History  Procedure Laterality Date  . Tonsillectomy  1939  . Colectomy  1987  . Abdominal hysterectomy  1974  . Appendectomy  1947  . Hernia repair  1990  . Cholecystectomy  2000  . Rotator cuff repair  2004  . Right temporal artery biopsy  10/1998, 10/2009  . Right cataract extraction  2011  . Eye surgery Bilateral   . Lumbar puncture  10/07/2004   Family History  Problem Relation Age of Onset  . Dementia Mother   .  Heart attack Father    History  Substance Use Topics  . Smoking status: Former Research scientist (life sciences)  . Smokeless tobacco: Former Systems developer  . Alcohol Use: No   OB History   Grav Para Term Preterm Abortions TAB SAB Ect Mult Living                 Review of Systems  Constitutional: Negative for fever and chills.  HENT: Negative for congestion and rhinorrhea.   Eyes: Negative for visual disturbance.  Respiratory: Negative for cough and shortness of breath.   Cardiovascular: Negative for chest pain and palpitations.  Gastrointestinal: Positive for nausea and vomiting. Negative for abdominal pain, diarrhea and blood in stool.  Genitourinary: Negative for dysuria and hematuria.  Musculoskeletal: Negative for back pain.  Skin: Negative for rash.  Neurological: Negative for weakness and headaches.      Allergies  Ceftriaxone; Ciprofloxacin; Codeine; Demerol; Metronidazole; Morphine and related; Nitrofurantoin monohyd macro; Pregabalin; Quinolones; and Septra  Home Medications   Prior to Admission medications   Medication Sig Start Date End Date Taking? Authorizing Provider  acetaminophen (TYLENOL) 500 MG tablet Take 500 mg by mouth every 4 (four) hours as needed for mild pain, fever or headache.  Yes Historical Provider, MD  alum & mag hydroxide-simeth (MAALOX/MYLANTA) 200-200-20 MG/5ML suspension Take 30 mLs by mouth every 6 (six) hours as needed for indigestion or heartburn.   Yes Historical Provider, MD  amLODipine (NORVASC) 5 MG tablet Take 5 mg by mouth every morning.   Yes Historical Provider, MD  Cranberry (CRAN-MAX) 500 MG CAPS Take 1 capsule by mouth 2 (two) times daily.   Yes Historical Provider, MD  dipyridamole-aspirin (AGGRENOX) 200-25 MG per 12 hr capsule Take 1 capsule by mouth 2 (two) times daily.   Yes Historical Provider, MD  donepezil (ARICEPT) 10 MG tablet Take 10 mg by mouth at bedtime.   Yes Historical Provider, MD  ferrous sulfate 325 (65 FE) MG tablet Take 325 mg by mouth daily  with breakfast.   Yes Historical Provider, MD  guaifenesin (Q-TUSSIN) 100 MG/5ML syrup Take 200 mg by mouth 3 (three) times daily as needed for cough.   Yes Historical Provider, MD  hydrALAZINE (APRESOLINE) 10 MG tablet Take 1 tablet (10 mg total) by mouth every 8 (eight) hours. 12/09/12  Yes Nishant Dhungel, MD  Lactobacillus (ACIDOPHILUS PO) Take 1 capsule by mouth 3 (three) times daily.   Yes Historical Provider, MD  levothyroxine (SYNTHROID, LEVOTHROID) 150 MCG tablet Take 150 mcg by mouth daily before breakfast.   Yes Historical Provider, MD  loperamide (IMODIUM) 2 MG capsule Take 2 mg by mouth as needed for diarrhea or loose stools.   Yes Historical Provider, MD  magnesium hydroxide (MILK OF MAGNESIA) 400 MG/5ML suspension Take 30 mLs by mouth at bedtime as needed for mild constipation.    Yes Historical Provider, MD  Menthol-Zinc Oxide (RISAMINE) 0.44-20.625 % OINT Apply 1 application topically 3 (three) times daily. AND as needed for episodes of incontinence.   Yes Historical Provider, MD  Multiple Vitamin (THERA/BETA-CAROTENE) TABS Take 1 tablet by mouth daily.   Yes Historical Provider, MD  Neomycin-Bacitracin-Polymyxin (TRIPLE ANTIBIOTIC) [phone] OINT Apply 1 application topically as needed (for wound care).   Yes Historical Provider, MD  ondansetron (ZOFRAN) 4 MG tablet Take 4 mg by mouth every 6 (six) hours as needed for nausea.   Yes Historical Provider, MD  ondansetron (ZOFRAN) 8 MG tablet Take 8 mg by mouth every 8 (eight) hours as needed for nausea.   Yes Historical Provider, MD  potassium chloride SA (K-DUR,KLOR-CON) 20 MEQ tablet Take 20 mEq by mouth 2 (two) times daily.   Yes Historical Provider, MD  predniSONE (DELTASONE) 10 MG tablet Take 10 mg by mouth every other day. Alternating day's with 15mg    Yes Historical Provider, MD  predniSONE (DELTASONE) 5 MG tablet Take 15 mg by mouth every other day. Alternating day's with 10mg    Yes Historical Provider, MD  ranitidine (ZANTAC)  150 MG tablet Take 150 mg by mouth 2 (two) times daily.   Yes Historical Provider, MD  saccharomyces boulardii (FLORASTOR) 250 MG capsule Take 250 mg by mouth 2 (two) times daily.   Yes Historical Provider, MD  sertraline (ZOLOFT) 50 MG tablet Take 50 mg by mouth every morning.    Yes Historical Provider, MD  promethazine (PHENERGAN) 25 MG tablet Take 1 tablet (25 mg total) by mouth every 6 (six) hours as needed for nausea or vomiting. 05/27/14   Jordan Hawks L Krayton Wortley, PA-C   BP 148/56  Pulse 71  Temp(Src) 97.9 F (36.6 C) (Oral)  Resp 16  SpO2 93% Physical Exam  Nursing note and vitals reviewed. Constitutional: She appears well-developed and well-nourished. No distress.  HENT:  Head: Normocephalic and atraumatic.  Mouth/Throat: Oropharynx is clear and moist.  Eyes: Conjunctivae and EOM are normal. Right eye exhibits no discharge. Left eye exhibits no discharge. No scleral icterus.  Neck: Normal range of motion. No JVD present.  Cardiovascular: Normal rate, regular rhythm and normal heart sounds.   Mild peripheral edema. 1+ distal pulses.  Pulmonary/Chest: Effort normal. No respiratory distress. She has no wheezes.  Abdominal: Soft. Bowel sounds are normal. She exhibits no distension. There is no tenderness.  Musculoskeletal: Normal range of motion. She exhibits no tenderness.  Neurological: She is alert. She exhibits normal muscle tone. Coordination normal.  Patient oriented to person and place but not to time or event. Patient without facial droop and moves all extremities. Good upper and lower extremity strength. Patient follows simple commands.   Skin: Skin is warm and dry. She is not diaphoretic.  Psychiatric: She has a normal mood and affect. Her behavior is normal.    ED Course  Procedures (including critical care time) Labs Review Labs Reviewed  CBC WITH DIFFERENTIAL - Abnormal; Notable for the following:    WBC 10.7 (*)    RDW 15.6 (*)    Neutrophils Relative % 89 (*)     Neutro Abs 9.6 (*)    Lymphocytes Relative 8 (*)    All other components within normal limits  COMPREHENSIVE METABOLIC PANEL - Abnormal; Notable for the following:    Sodium 133 (*)    Chloride 94 (*)    Glucose, Bld 125 (*)    Albumin 3.1 (*)    GFR calc non Af Amer 46 (*)    GFR calc Af Amer 53 (*)    All other components within normal limits  LIPASE, BLOOD  URINALYSIS, ROUTINE W REFLEX MICROSCOPIC  I-STAT TROPOININ, ED    Imaging Review Dg Chest Portable 1 View  05/27/2014   CLINICAL DATA:  Shortness of breath, weakness and nausea.  EXAM: PORTABLE CHEST - 1 VIEW  COMPARISON:  Chest radiograph May 09, 2014  FINDINGS: Cardiac silhouette appears mildly enlarged. Mediastinal silhouette is nonsuspicious, mildly calcified aortic knob. Diffuse mild interstitial prominence, somewhat cough on MRI the right upper lobe. Blunted left costophrenic angle. No pneumothorax.  Foreshortened right distal clavicle may be postoperative. Patient is osteopenic. Soft tissue planes are nonsuspicious. Surgical clips in the included right abdomen likely reflect cholecystectomy.  IMPRESSION: Mild cardiomegaly. Slightly increasing interstitial prominence may reflect pulmonary edema, somewhat confluent in the right upper lobe, superimposed early pneumonia cannot be excluded.   Electronically Signed   By: Elon Alas   On: 05/27/2014 17:46     EKG Interpretation   Date/Time:  Tuesday May 27 2014 16:35:40 EDT Ventricular Rate:  77 PR Interval:  158 QRS Duration: 125 QT Interval:  417 QTC Calculation: 472 R Axis:     Text Interpretation:  Sinus rhythm Right bundle branch block since last  tracing no significant change Confirmed by Eulis Foster  MD, Vira Agar ([id number]) on  05/27/2014 8:36:12 PM     Meds given in ED:  Medications  0.9 %  sodium chloride infusion (0 mLs Intravenous Stopped 05/27/14 2249)    Discharge Medication List as of 05/27/2014 11:57 PM    START taking these medications   Details   promethazine (PHENERGAN) 25 MG tablet Take 1 tablet (25 mg total) by mouth every 6 (six) hours as needed for nausea or vomiting., Starting 05/27/2014, Until Discontinued, Print          MDM  Final diagnoses:  Nausea and vomiting, vomiting of unspecified type   [name] is a 80 y.o. female with PMH of HTN, CHF, dementia, chronic UTI who presents from assisted living [facility] facility) due to "intractable vomiting." Per daughter, patient has had intermittent vomiting for the past three days and did not vomit while in ED. Daughter reports she was asymptomatic yesterday. Patient without abdominal pain or nausea in ED. Patient tolerated fluids in the ED without nausea or vomiting. Daughter denies patient having a fever or tactile fever. Daughter reports that patient mentating at her baseline. Afebrile with VSS. Patient with soft nontender abdomen without rebound or signs of peritonitis. UA without signs of infection. Troponin negative and EKG without signs of infarction. WBC 10.7 but is non specific. Patient without fever or acute abdomen. CXR with questionable pulmonary edema but patient without fever, tachypnea, cough, sputum or SOB and lungs clear on exam. I doubt PNA.  Patient is nontoxic, nonseptic appearing, in no apparent distress.  Patient's symptoms adequately managed in emergency department.  Fluid bolus given.  Labs, imaging and vitals reviewed.  Patient does not meet the SIRS or Sepsis criteria.  On repeat exam patient does not have a surgical abdomin and there are no peritoneal signs.  No indication of appendicitis, bowel obstruction, bowel perforation, cholecystitis, diverticulitis.  Patient discharged to nursing home with symptomatic treatment and given strict instructions for follow-up with their primary care physician.  I have also discussed reasons to return immediately to the ER.  Daughter expresses understanding and agrees with plan.  Discussed return precautions with  daughter. Discussed all results and daughter verbalizes understanding and agrees with plan.  This is a shared patient. This patient was discussed with the physician, Christ Kick who saw and evaluated the patient.     Pura Spice, PA-C 05/28/14 1256

## 2014-05-27 NOTE — ED Notes (Signed)
Slow to respond

## 2014-05-27 NOTE — ED Notes (Signed)
This Agricultural consultant attempted to start a line w/o success.  Asked Lanelle Bal RN to attempt w/ ultrasound.

## 2014-05-27 NOTE — ED Notes (Signed)
Bed: WA08 Expected date:  Expected time:  Means of arrival:  Comments: EMS- 80yo F

## 2014-05-27 NOTE — Discharge Instructions (Signed)
Return to the emergency room with worsening of symptoms, new symptoms or with symptoms that are concerning, severe abdominal pain, nausea and intractable vomiting, unable to keep foods down, fevers. Continue to take routine medications as prescribed. Patient not complaining of nausea but can take phenergan or zofran as needed. Please call your doctor for a followup appointment within 24-48 hours. When you talk to your doctor please let them know that you were seen in the emergency department and have them acquire all of your records so that they can discuss the findings with you and formulate a treatment plan to fully care for your new and ongoing problems.   Nausea and Vomiting Nausea is a sick feeling that often comes before throwing up (vomiting). Vomiting is a reflex where stomach contents come out of your mouth. Vomiting can cause severe loss of body fluids (dehydration). Children and elderly adults can become dehydrated quickly, especially if they also have diarrhea. Nausea and vomiting are symptoms of a condition or disease. It is important to find the cause of your symptoms. CAUSES   Direct irritation of the stomach lining. This irritation can result from increased acid production (gastroesophageal reflux disease), infection, food poisoning, taking certain medicines (such as nonsteroidal anti-inflammatory drugs), alcohol use, or tobacco use.  Signals from the brain.These signals could be caused by a headache, heat exposure, an inner ear disturbance, increased pressure in the brain from injury, infection, a tumor, or a concussion, pain, emotional stimulus, or metabolic problems.  An obstruction in the gastrointestinal tract (bowel obstruction).  Illnesses such as diabetes, hepatitis, gallbladder problems, appendicitis, kidney problems, cancer, sepsis, atypical symptoms of a heart attack, or eating disorders.  Medical treatments such as chemotherapy and radiation.  Receiving medicine that  makes you sleep (general anesthetic) during surgery. DIAGNOSIS Your caregiver may ask for tests to be done if the problems do not improve after a few days. Tests may also be done if symptoms are severe or if the reason for the nausea and vomiting is not clear. Tests may include:  Urine tests.  Blood tests.  Stool tests.  Cultures (to look for evidence of infection).  X-rays or other imaging studies. Test results can help your caregiver make decisions about treatment or the need for additional tests. TREATMENT You need to stay well hydrated. Drink frequently but in small amounts.You may wish to drink water, sports drinks, clear broth, or eat frozen ice pops or gelatin dessert to help stay hydrated.When you eat, eating slowly may help prevent nausea.There are also some antinausea medicines that may help prevent nausea. HOME CARE INSTRUCTIONS   Take all medicine as directed by your caregiver.  If you do not have an appetite, do not force yourself to eat. However, you must continue to drink fluids.  If you have an appetite, eat a normal diet unless your caregiver tells you differently.  Eat a variety of complex carbohydrates (rice, wheat, potatoes, bread), lean meats, yogurt, fruits, and vegetables.  Avoid high-fat foods because they are more difficult to digest.  Drink enough water and fluids to keep your urine clear or pale yellow.  If you are dehydrated, ask your caregiver for specific rehydration instructions. Signs of dehydration may include:  Severe thirst.  Dry lips and mouth.  Dizziness.  Dark urine.  Decreasing urine frequency and amount.  Confusion.  Rapid breathing or pulse. SEEK IMMEDIATE MEDICAL CARE IF:   You have blood or brown flecks (like coffee grounds) in your vomit.  You have black or  bloody stools.  You have a severe headache or stiff neck.  You are confused.  You have severe abdominal pain.  You have chest pain or trouble breathing.  You  do not urinate at least once every 8 hours.  You develop cold or clammy skin.  You continue to vomit for longer than 24 to 48 hours.  You have a fever. MAKE SURE YOU:   Understand these instructions.  Will watch your condition.  Will get help right away if you are not doing well or get worse. Document Released: 09/12/2005 Document Revised: 12/05/2011 Document Reviewed: 02/09/2011 [facility] Patient Information 2015 [place], [place]. This information is not intended to replace advice given to you by your health care provider. Make sure you discuss any questions you have with your health care provider.

## 2014-05-27 NOTE — ED Notes (Signed)
Pt presented from [facility] (on [address]), EMS reports and transfer paperwork reveals facilities MD's request for pt to be evaluated for "Iractractable vomiting." Pt denies pain, reports general malaise.

## 2014-05-28 NOTE — ED Notes (Signed)
[facility] called for transfer back to nursing home

## 2014-07-25 IMAGING — CR DG ABD PORTABLE 1V
2 series · 2 of 2 positions shown · non-contrast
Comparison: 01/24/2011.

CLINICAL DATA: Abdominal pain.

PORTABLE ABDOMEN - 1 VIEW

[AP (1 of 2)]
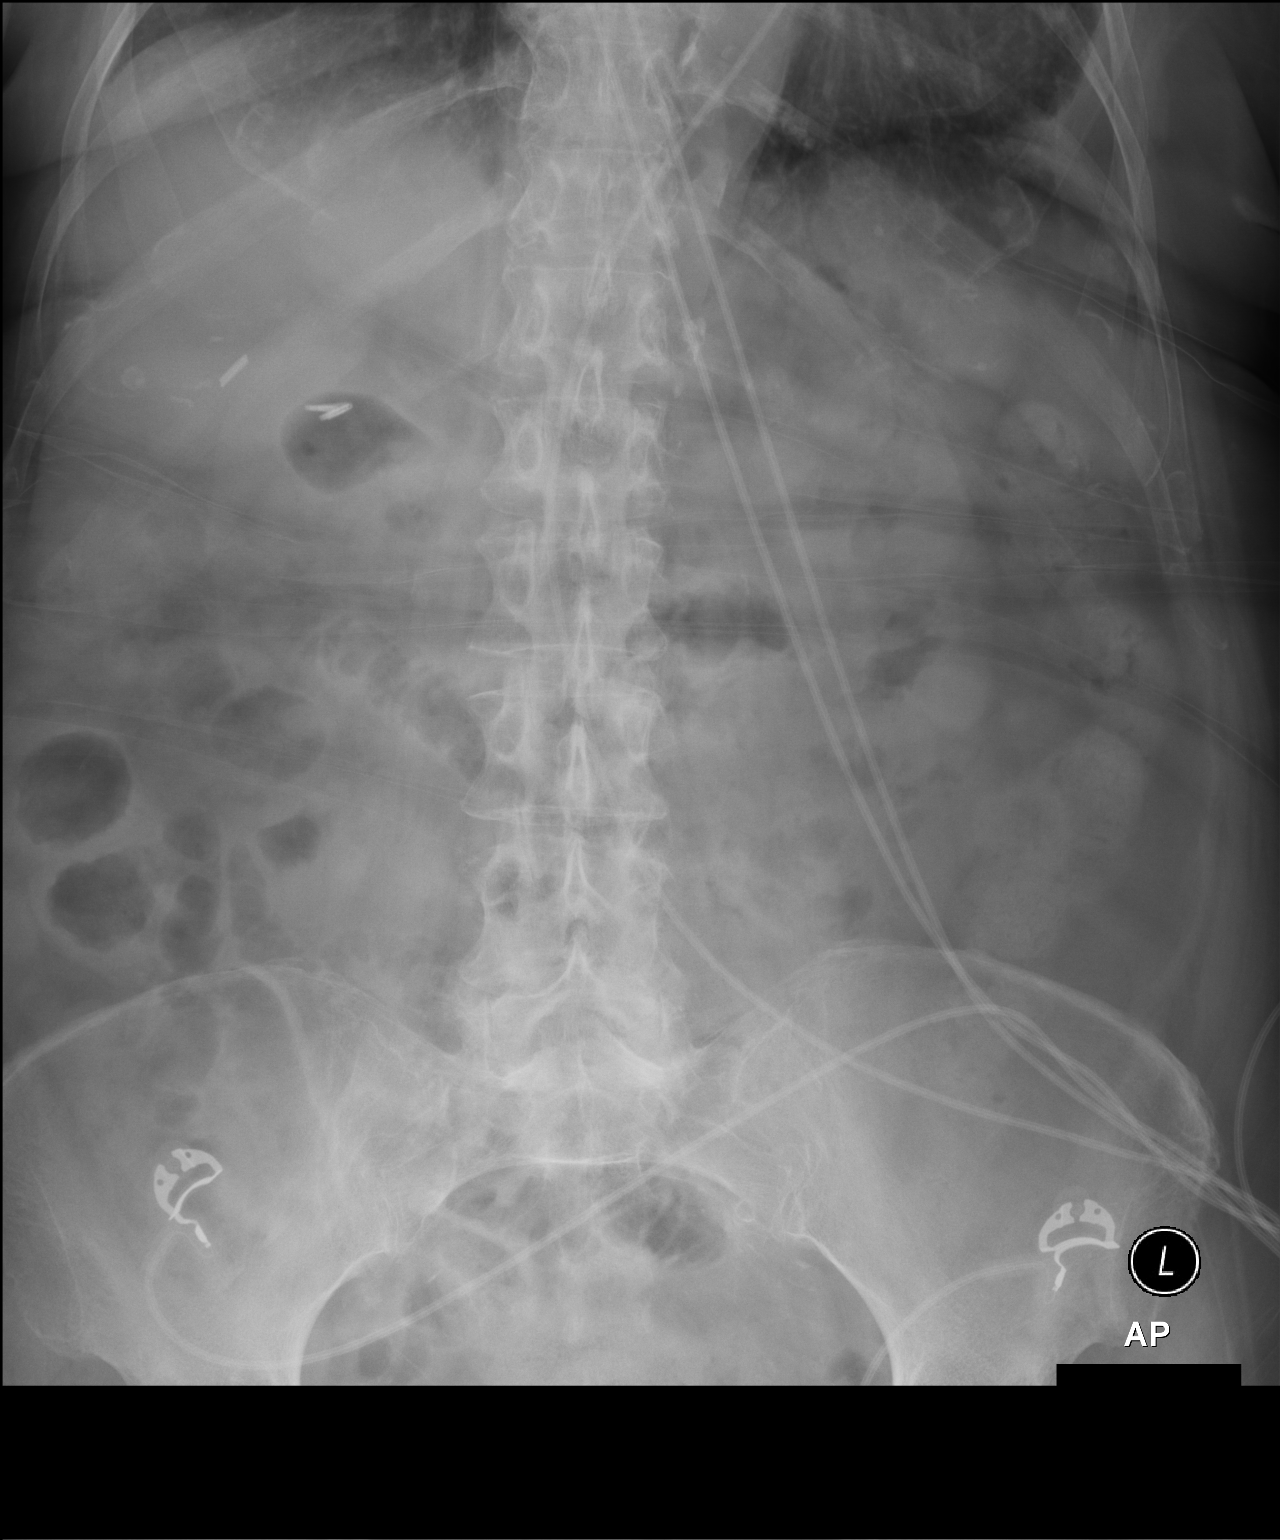

[AP (2 of 2)]
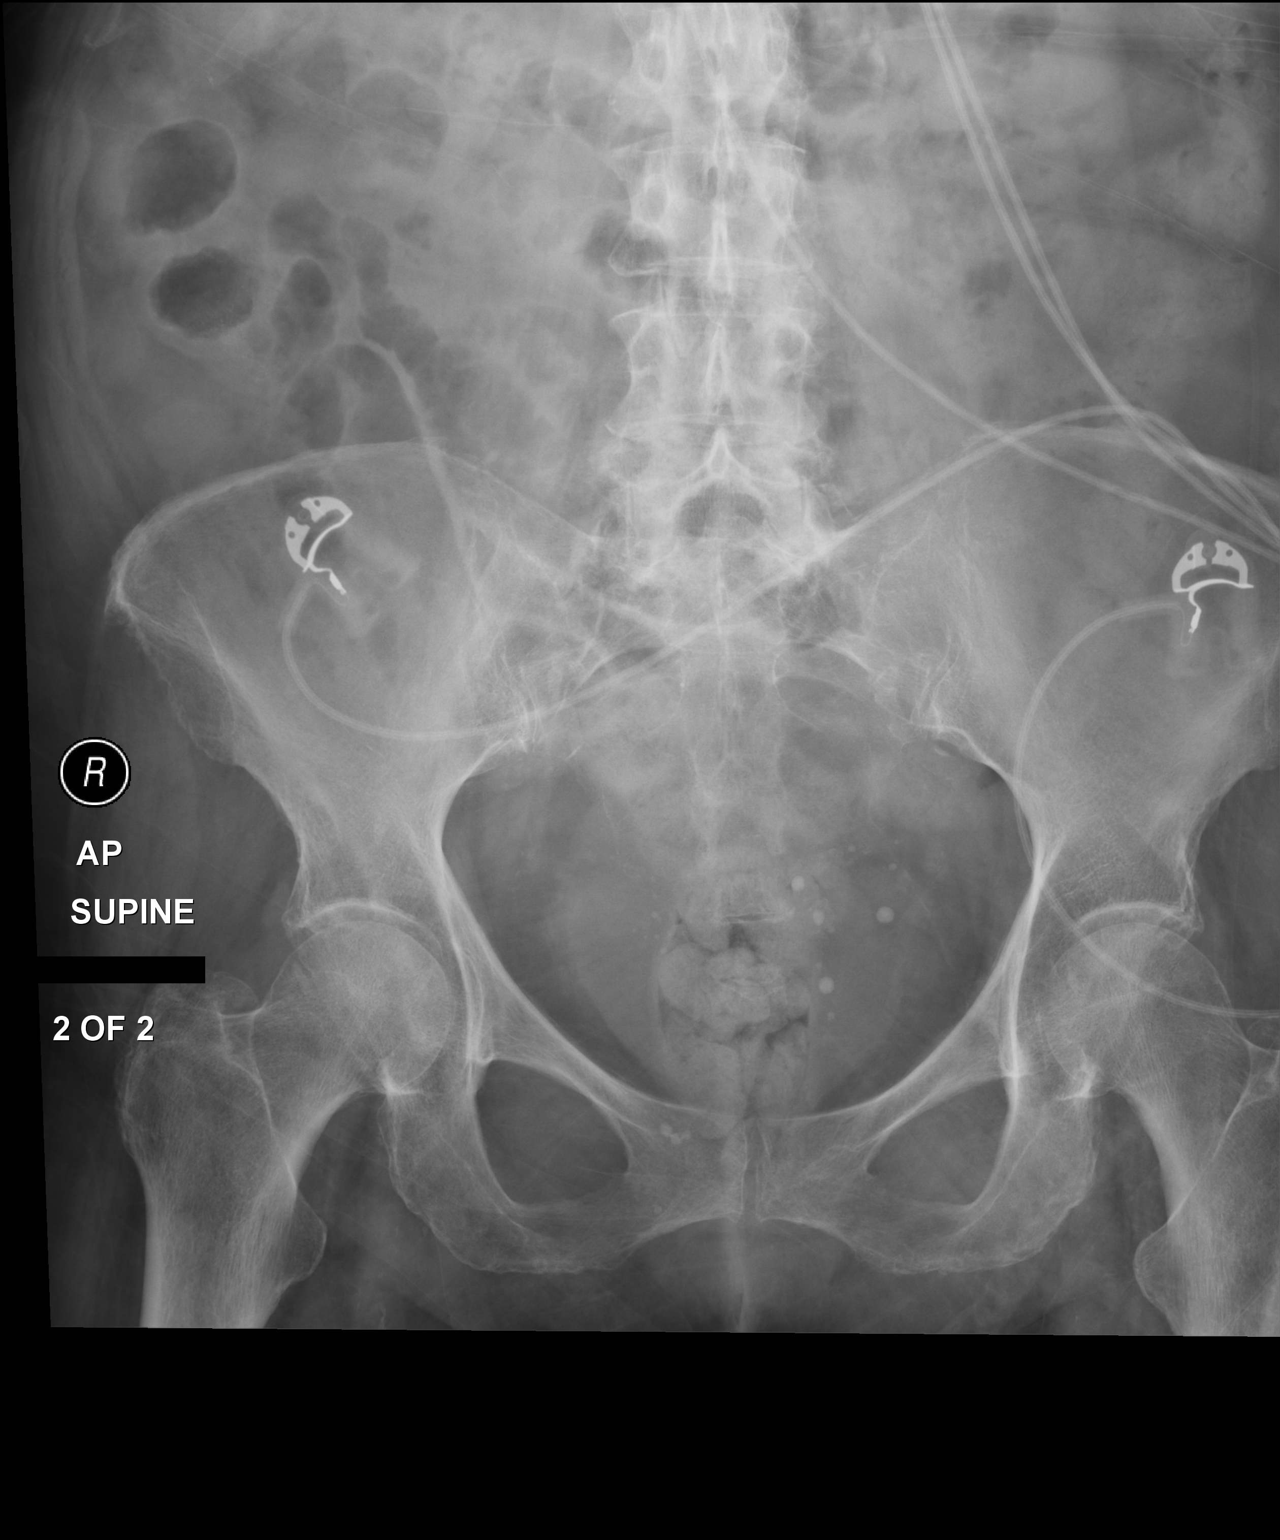

[2 of 2 positions shown; findings below may reference images not displayed]

FINDINGS: Cholecystectomy clips are present in the right upper
quadrant.  Nonobstructive bowel gas pattern.  Calcified phleboliths
in the anatomic pelvis.  Stool and bowel gas are present within the
rectosigmoid.  No plain film evidence of free air on this supine
radiograph.
IMPRESSION: No acute abnormality.  Nonobstructive bowel gas pattern.
Cholecystectomy.

## 2015-03-23 ENCOUNTER — Other Ambulatory Visit: Payer: Self-pay

## 2015-04-27 DEATH — deceased
